# Patient Record
Sex: Female | Born: 1950
Health system: Southern US, Community
[De-identification: ages and names within clinical notes are randomized; demographics above are authoritative.]

## PROBLEM LIST (undated history)

## (undated) DIAGNOSIS — E119 Type 2 diabetes mellitus without complications: Secondary | ICD-10-CM

## (undated) DIAGNOSIS — G4730 Sleep apnea, unspecified: Secondary | ICD-10-CM

## (undated) DIAGNOSIS — E669 Obesity, unspecified: Secondary | ICD-10-CM

## (undated) DIAGNOSIS — I10 Essential (primary) hypertension: Secondary | ICD-10-CM

## (undated) DIAGNOSIS — E782 Mixed hyperlipidemia: Secondary | ICD-10-CM

## (undated) DIAGNOSIS — R000 Tachycardia, unspecified: Secondary | ICD-10-CM

## (undated) DIAGNOSIS — Z86718 Personal history of other venous thrombosis and embolism: Secondary | ICD-10-CM

## (undated) DIAGNOSIS — R7309 Other abnormal glucose: Secondary | ICD-10-CM

## (undated) DIAGNOSIS — I2699 Other pulmonary embolism without acute cor pulmonale: Secondary | ICD-10-CM

## (undated) DIAGNOSIS — M1990 Unspecified osteoarthritis, unspecified site: Secondary | ICD-10-CM

## (undated) DIAGNOSIS — E559 Vitamin D deficiency, unspecified: Secondary | ICD-10-CM

## (undated) DIAGNOSIS — H409 Unspecified glaucoma: Secondary | ICD-10-CM

## (undated) DIAGNOSIS — I509 Heart failure, unspecified: Secondary | ICD-10-CM

## (undated) DIAGNOSIS — J45909 Unspecified asthma, uncomplicated: Secondary | ICD-10-CM

## (undated) DIAGNOSIS — I452 Bifascicular block: Secondary | ICD-10-CM

## (undated) DIAGNOSIS — J302 Other seasonal allergic rhinitis: Secondary | ICD-10-CM

## (undated) DIAGNOSIS — S83209A Unspecified tear of unspecified meniscus, current injury, unspecified knee, initial encounter: Secondary | ICD-10-CM

## (undated) DIAGNOSIS — R0602 Shortness of breath: Secondary | ICD-10-CM

## (undated) DIAGNOSIS — H269 Unspecified cataract: Secondary | ICD-10-CM

## (undated) HISTORY — DX: Unspecified cataract: H26.9

## (undated) HISTORY — PX: OTHER SURGICAL HISTORY: SHX169

## (undated) HISTORY — DX: Obesity, unspecified: E66.9

## (undated) HISTORY — DX: Type 2 diabetes mellitus without complications: E11.9

## (undated) HISTORY — DX: Vitamin D deficiency, unspecified: E55.9

## (undated) HISTORY — PX: TOTAL ABDOMINAL HYSTERECTOMY W/ BILATERAL SALPINGOOPHORECTOMY: SHX83

## (undated) HISTORY — DX: Unspecified glaucoma: H40.9

## (undated) HISTORY — DX: Essential (primary) hypertension: I10

## (undated) HISTORY — DX: Heart failure, unspecified: I50.9

## (undated) HISTORY — DX: Other abnormal glucose: R73.09

## (undated) HISTORY — DX: Unspecified asthma, uncomplicated: J45.909

## (undated) HISTORY — DX: Mixed hyperlipidemia: E78.2

---

## 1999-11-10 ENCOUNTER — Ambulatory Visit (HOSPITAL_COMMUNITY): Admission: RE | Admit: 1999-11-10 | Discharge: 1999-11-10 | Payer: Self-pay | Admitting: *Deleted

## 2002-12-09 ENCOUNTER — Ambulatory Visit (HOSPITAL_BASED_OUTPATIENT_CLINIC_OR_DEPARTMENT_OTHER): Admission: RE | Admit: 2002-12-09 | Discharge: 2002-12-09 | Payer: Self-pay | Admitting: Orthopedic Surgery

## 2002-12-09 HISTORY — PX: KNEE ARTHROSCOPY: SUR90

## 2005-01-04 ENCOUNTER — Ambulatory Visit (HOSPITAL_COMMUNITY): Admission: RE | Admit: 2005-01-04 | Discharge: 2005-01-04 | Payer: Self-pay | Admitting: Family Medicine

## 2008-07-30 ENCOUNTER — Emergency Department (HOSPITAL_COMMUNITY): Admission: EM | Admit: 2008-07-30 | Discharge: 2008-07-30 | Payer: Self-pay | Admitting: Emergency Medicine

## 2008-08-05 ENCOUNTER — Ambulatory Visit: Payer: Self-pay | Admitting: *Deleted

## 2008-09-11 ENCOUNTER — Encounter: Admission: RE | Admit: 2008-09-11 | Discharge: 2008-12-10 | Payer: Self-pay | Admitting: Internal Medicine

## 2008-10-08 ENCOUNTER — Encounter: Payer: Self-pay | Admitting: Cardiology

## 2008-10-20 ENCOUNTER — Ambulatory Visit: Payer: Self-pay | Admitting: *Deleted

## 2009-02-28 ENCOUNTER — Ambulatory Visit (HOSPITAL_COMMUNITY): Admission: RE | Admit: 2009-02-28 | Discharge: 2009-02-28 | Payer: Self-pay | Admitting: Sports Medicine

## 2009-03-19 ENCOUNTER — Encounter: Payer: Self-pay | Admitting: Cardiology

## 2009-03-25 DIAGNOSIS — I10 Essential (primary) hypertension: Secondary | ICD-10-CM | POA: Insufficient documentation

## 2009-03-26 ENCOUNTER — Ambulatory Visit: Payer: Self-pay | Admitting: Cardiology

## 2010-07-18 ENCOUNTER — Encounter: Payer: Self-pay | Admitting: Family Medicine

## 2010-10-26 ENCOUNTER — Encounter: Payer: Self-pay | Admitting: Cardiology

## 2010-10-28 ENCOUNTER — Ambulatory Visit (INDEPENDENT_AMBULATORY_CARE_PROVIDER_SITE_OTHER): Payer: BC Managed Care – PPO | Admitting: Cardiology

## 2010-10-28 ENCOUNTER — Encounter: Payer: Self-pay | Admitting: Cardiology

## 2010-10-28 VITALS — BP 140/87 | HR 103 | Ht 62.0 in | Wt 288.0 lb

## 2010-10-28 DIAGNOSIS — R9431 Abnormal electrocardiogram [ECG] [EKG]: Secondary | ICD-10-CM

## 2010-10-28 DIAGNOSIS — E669 Obesity, unspecified: Secondary | ICD-10-CM

## 2010-10-28 DIAGNOSIS — I10 Essential (primary) hypertension: Secondary | ICD-10-CM

## 2010-10-28 DIAGNOSIS — R0609 Other forms of dyspnea: Secondary | ICD-10-CM

## 2010-10-28 DIAGNOSIS — R0989 Other specified symptoms and signs involving the circulatory and respiratory systems: Secondary | ICD-10-CM

## 2010-10-28 DIAGNOSIS — R0600 Dyspnea, unspecified: Secondary | ICD-10-CM

## 2010-10-28 NOTE — Assessment & Plan Note (Signed)
I would like to assess her with an echocardiogram to evaluate her dyspnea and abnormal EKG. Provided this is normal I would not suggest further cardiovascular testing prior to breast reduction therapy. I have instructed her on how to keep her blood pressure diary and heart rate as I would like to know if she's having continued resting tachycardia and she's not in distress at a doctor's office waiting in hour for her appointment.
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
## 2010-10-28 NOTE — Patient Instructions (Signed)
You will be scheduled for a 2 D Echo. Follow up with Dr Antoine Poche in 4 months.

## 2010-10-28 NOTE — Assessment & Plan Note (Signed)
She has lost some weight and I applauded his. She understands the need for continued weight loss with diet and exercise and I agree with plans for breast reduction.

## 2010-10-28 NOTE — Assessment & Plan Note (Signed)
Her blood pressure with the most part is well controlled and she will continue meds as listed.

## 2010-11-03 ENCOUNTER — Telehealth: Payer: Self-pay | Admitting: Cardiology

## 2010-11-03 NOTE — Telephone Encounter (Signed)
Left message with patient husband for Ms. Nobis to call the office to schedule echo/57month rov with Dr. Antoine Poche. Patient has not returned my call. The appointments with be in recall in-case the patient calls back.
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
## 2010-11-12 NOTE — Op Note (Signed)
NAME:  Kerry Liu, HASSEBROCK                          ACCOUNT NO.:  [id number]   MEDICAL RECORD NO.:  [id number]                   PATIENT TYPE:  AMB   LOCATION:  [facility]SC                                  FACILITY:  [facility]   PHYSICIAN:  Feliberto Gottron. Turner Daniels, M.D.                DATE OF BIRTH:  Jan 27, 1951   DATE OF PROCEDURE:  12/09/2002  DATE OF DISCHARGE:                                 OPERATIVE REPORT   PREOPERATIVE DIAGNOSIS:  Right knee lateral meniscal tear.   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   PROCEDURE:  Right knee arthroscopic debridement of chondromalacia.   SURGEON:  Feliberto Gottron. Turner Daniels, M.D.   ASSISTANT:  None.   ANESTHESIA:  General endotracheal.   ESTIMATED BLOOD LOSS:  Minimal.   FLUIDS REPLACED:  1300 mL of crystalloid.   DRAINS:  None.   TOURNIQUET TIME:  None.   INDICATIONS FOR PROCEDURE:  This 60 year old woman with symptomatic lateral  knee pain on the right side has failed conservative treatment with  observation, anti-inflammatory medicines and exercises.  Because of  persistent pain, she is taken for an arthroscopic evaluation and treatment  of her right knee and has a presumed lateral meniscal tear, versus  chondromalacia of the lateral side of the joint.  The risks and benefits of  surgery are understood by the patient.  She is taken for this procedure, to  decrease pain and increase function.   DESCRIPTION OF PROCEDURE:  The patient was identified by arm band and taken  to the operating room at [facility]. [facility].  Appropriate anesthetic monitors were attached.  A general endotracheal  anesthesia was induced with the patient in the supine position.  The lateral  posts were applied to the table and the right lower extremity was prepped  and draped in the usual sterile fashion from the ankle to the near thigh.  Using 0.5% Marcaine and  epinephrine solution, we then infiltrated the  inferomedial and inferolateral peripatellar portal regions with 5 mL into  each portion, and made standard inferomedial and inferolateral peripatellar  portals with a #11 blade.  The arthroscope was introduced through the  inferolateral portal, the outflow through the inferomedial portal.  Diagnostic arthroscopy revealed focal grade 3 flap tears of the attachment  of the patella which was debrided back to stable margins with the 3.5 gator  sucker shaver.  The medial compartment was in excellent condition.  The ACL  was intact on the lateral side.  Grade 3 chondromalacia of the  posterolateral section of the lateral tibial condyle was identified and  debrided back to stable margin with a 3.5 gator sucker shaver.  The gutters  were cleared.  The scope was taken to the lateral side of the PCL, clearing  the posterior compartment  as well.  The  knee was then washed out with normal saline solution.  The arthroscopic  instruments were removed.  A dressing with Xeroform, 4 x 4 dressing,  sponges, Webril and an Ace wrap applied.  The patient was awakened and taken to the recovery room without difficulty.                                                Feliberto Gottron. Turner Daniels, M.D.    Ovid Curd  D:  12/09/2002  T:  12/09/2002  Job:  [id number]

## 2010-11-17 ENCOUNTER — Encounter: Payer: BC Managed Care – PPO | Admitting: *Deleted

## 2010-11-17 ENCOUNTER — Ambulatory Visit (HOSPITAL_COMMUNITY): Payer: BC Managed Care – PPO | Attending: Cardiology

## 2010-11-17 ENCOUNTER — Other Ambulatory Visit (HOSPITAL_COMMUNITY): Payer: BC Managed Care – PPO

## 2010-11-17 DIAGNOSIS — I10 Essential (primary) hypertension: Secondary | ICD-10-CM | POA: Insufficient documentation

## 2010-11-17 DIAGNOSIS — E669 Obesity, unspecified: Secondary | ICD-10-CM | POA: Insufficient documentation

## 2010-11-17 DIAGNOSIS — E785 Hyperlipidemia, unspecified: Secondary | ICD-10-CM | POA: Insufficient documentation

## 2010-11-17 DIAGNOSIS — R9431 Abnormal electrocardiogram [ECG] [EKG]: Secondary | ICD-10-CM

## 2010-11-17 DIAGNOSIS — R0609 Other forms of dyspnea: Secondary | ICD-10-CM | POA: Insufficient documentation

## 2010-11-17 DIAGNOSIS — R0600 Dyspnea, unspecified: Secondary | ICD-10-CM

## 2010-11-17 DIAGNOSIS — R0989 Other specified symptoms and signs involving the circulatory and respiratory systems: Secondary | ICD-10-CM

## 2010-11-17 HISTORY — PX: TRANSTHORACIC ECHOCARDIOGRAM: SHX275

## 2010-12-24 ENCOUNTER — Encounter: Payer: Self-pay | Admitting: Cardiology

## 2011-01-13 ENCOUNTER — Telehealth: Payer: Self-pay | Admitting: Cardiology

## 2011-01-13 NOTE — Telephone Encounter (Signed)
Patient called to request a copy of her Echo. Be sent to her Primary Care Physician's Office. I faxed a copy of the echo. to Loree Fee, PA at [facility] ([phone]).

## 2012-07-20 ENCOUNTER — Other Ambulatory Visit: Payer: Self-pay | Admitting: Sports Medicine

## 2012-07-20 DIAGNOSIS — M25561 Pain in right knee: Secondary | ICD-10-CM

## 2012-07-24 ENCOUNTER — Ambulatory Visit
Admission: RE | Admit: 2012-07-24 | Discharge: 2012-07-24 | Disposition: A | Discharge status: Home / Self Care | Payer: BC Managed Care – PPO | Source: Ambulatory Visit | Attending: Sports Medicine | Admitting: Sports Medicine

## 2012-07-24 DIAGNOSIS — M25561 Pain in right knee: Secondary | ICD-10-CM

## 2012-08-16 ENCOUNTER — Ambulatory Visit (INDEPENDENT_AMBULATORY_CARE_PROVIDER_SITE_OTHER): Payer: BC Managed Care – PPO | Admitting: Cardiology

## 2012-08-16 ENCOUNTER — Encounter: Payer: Self-pay | Admitting: Cardiology

## 2012-08-16 ENCOUNTER — Encounter: Payer: Self-pay | Admitting: *Deleted

## 2012-08-16 VITALS — BP 150/98 | HR 135 | Ht 62.0 in | Wt 268.4 lb

## 2012-08-16 DIAGNOSIS — I10 Essential (primary) hypertension: Secondary | ICD-10-CM

## 2012-08-16 DIAGNOSIS — E669 Obesity, unspecified: Secondary | ICD-10-CM

## 2012-08-16 DIAGNOSIS — Z01810 Encounter for preprocedural cardiovascular examination: Secondary | ICD-10-CM

## 2012-08-16 DIAGNOSIS — R9431 Abnormal electrocardiogram [ECG] [EKG]: Secondary | ICD-10-CM

## 2012-08-16 MED ORDER — METOPROLOL TARTRATE 50 MG PO TABS: 50.0000 mg | ORAL_TABLET | Freq: Two times a day (BID) | ORAL | Status: DC

## 2012-08-16 NOTE — Patient Instructions (Addendum)
Please start Metoprolol 50 mg one twice a day. Continue all other medications as listed.  Please have blood work at next office visit.  Follow up in 1 week.
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
## 2012-08-21 ENCOUNTER — Other Ambulatory Visit: Payer: Self-pay

## 2012-08-21 ENCOUNTER — Encounter: Payer: Self-pay | Admitting: Physician Assistant

## 2012-08-21 ENCOUNTER — Ambulatory Visit (INDEPENDENT_AMBULATORY_CARE_PROVIDER_SITE_OTHER): Payer: BC Managed Care – PPO | Admitting: Physician Assistant

## 2012-08-21 VITALS — BP 120/90 | HR 70 | Ht 62.0 in | Wt 265.8 lb

## 2012-08-21 DIAGNOSIS — Z01810 Encounter for preprocedural cardiovascular examination: Secondary | ICD-10-CM

## 2012-08-21 DIAGNOSIS — M1990 Unspecified osteoarthritis, unspecified site: Secondary | ICD-10-CM

## 2012-08-21 DIAGNOSIS — I10 Essential (primary) hypertension: Secondary | ICD-10-CM

## 2012-08-21 DIAGNOSIS — M129 Arthropathy, unspecified: Secondary | ICD-10-CM

## 2012-08-21 DIAGNOSIS — R9431 Abnormal electrocardiogram [ECG] [EKG]: Secondary | ICD-10-CM

## 2012-08-21 NOTE — Patient Instructions (Addendum)
**Note De-Identified [name] Obfuscation** Your physician recommends that you return for lab work in: today  Your physician recommends that you continue on your current medications as directed. Please refer to the Current Medication list given to you today.  Your physician recommends that you schedule a follow-up appointment in: as needed  You have been referred to [facility] for a second opinion on knee arthritis
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
## 2012-08-22 LAB — BASIC METABOLIC PANEL
BUN: 26 mg/dL — ABNORMAL HIGH (ref 6–23)
CO2: 29 mEq/L (ref 19–32)
Chloride: 103 mEq/L (ref 96–112)
Creatinine, Ser: 1.2 mg/dL (ref 0.4–1.2)
Glucose, Bld: 143 mg/dL — ABNORMAL HIGH (ref 70–99)

## 2012-08-29 ENCOUNTER — Telehealth: Payer: Self-pay | Admitting: Cardiology

## 2012-09-10 ENCOUNTER — Telehealth: Payer: Self-pay | Admitting: Cardiology

## 2012-09-10 NOTE — Telephone Encounter (Signed)
New Problem:    Patient called in because she is having issues arranging an appointment to see an orthopedic MD per Dr. Jenene Slicker referral.  Patient claims that she dropped of her records to their office and they continue to tell her that their physicians have not evaluated her case yet.  Please call back.

## 2012-09-10 NOTE — Telephone Encounter (Signed)
[facility] was where she was to be seen. Pt very distraught regarding issues with her medical records. Pt was seen at [facility] and now needs app at [facility], pt took records to [facility] and dropped off- they were miss placed for 1 week and pt had to get another set of records in the mean time, she is off of work and needs to be seen for her knee, pt getting run around, they will not set app till pts records are reviewed. I sent staff msg to [facility] pool to set app for her. i will forward to dr/nurse

## 2012-09-11 NOTE — Telephone Encounter (Signed)
Left message for pt to call back to discuss her needs

## 2012-09-11 NOTE — Telephone Encounter (Signed)
Kerry Liu note is a preop clearance note.  I am not sure from this phone contact whether there is something else that this office is supposed to be doing.

## 2012-09-11 NOTE — Telephone Encounter (Signed)
Per Jasmine December - pt scheduled with Dr Darrelyn Hillock March 27 @10 :45 am  Left message for pt at her home number of the appointment date and time and to call back with any questions or concerns.

## 2012-09-25 NOTE — Telephone Encounter (Signed)
New problem    Status of cardiac clearance for [facility].

## 2012-09-25 NOTE — Telephone Encounter (Signed)
Pt states that a cardiac clearance letter from [facility] was faxed to [place] last week and she wants to know if she has been cleared. Pt request a call from Dr. Jenene Slicker nurse, Elita Quick, tomorrow. Note forwarded to [facility].

## 2012-09-26 NOTE — Telephone Encounter (Signed)
Clearance routed to Dr Ranell Patrick that was given by Lilian Coma 08/21/2012  Pt is aware

## 2012-09-28 ENCOUNTER — Encounter (HOSPITAL_BASED_OUTPATIENT_CLINIC_OR_DEPARTMENT_OTHER): Payer: Self-pay | Admitting: *Deleted

## 2012-10-01 ENCOUNTER — Encounter (HOSPITAL_BASED_OUTPATIENT_CLINIC_OR_DEPARTMENT_OTHER): Payer: Self-pay | Admitting: *Deleted

## 2012-10-01 NOTE — Progress Notes (Signed)
NPO AFTER MN. ARRIVES AT 1030. NEEDS ISTAT . CURRENT EKG IN EPIC AND CHART. WILL TAKE LOPRESSOR AND CRESTOR AM OF SURG W/ SIP OF WATER.

## 2012-10-03 NOTE — H&P (Signed)
Kerry Liu is an 62 y.o. female.   Chief Complaint: right knee pain HPI: The patient is a 62 year old female who presented with knee complaints. The patient reports right knee symptoms including: pain which began 4 months ago following a specific injury. The injury occurred due to a fall. Symptoms are reported to be located in the right knee and include knee pain, instability, difficulty bearing weight and difficulty ambulating. The patient describes their pain as aching. Symptoms are exacerbated by motion at the knee, weight bearing and walking. She had cortisone injections, she had only one injection with no relief. She has not had any surgery or viscosupplementation injections. MRI revealed torn medial meniscus.   Past Medical History  Diagnosis Date  . HLD (hyperlipidemia)   . Acute meniscal tear of knee     RIGHT  . History of DVT (deep vein thrombosis)     IN PREGNANCY  . RBBB (right bundle branch block with left anterior fascicular block)   . Sinus tachycardia   . Seasonal allergies   . HTN (hypertension)     CARDIOLOGIST--  DR HOCHREIN-- CLEARANCE NOTE IN EPIC AND CHART FROM 08-21-2012  . Arthritis     Past Surgical History  Procedure Laterality Date  . Knee arthroscopy Right 12-09-2002  . Transthoracic echocardiogram  11-17-2010    MILD LVH/ EF 60-65%/ GRADE I DIASTOLIC DYSFUNCTION/ MILD LAE / MILD RAE  . Abdominal hysterectomy  AGE 23    W/ BILATERAL SALPINGOOPHORECTOMY    Family History  Problem Relation Age of Onset  . Heart attack     Social History:  reports that she has never smoked. She has never used smokeless tobacco. She reports that she does not drink alcohol or use illicit drugs.  Allergies:  Allergies  Allergen Reactions  . Oxycodone Rash  . Tramadol Rash    Current outpatient prescriptions: acetaminophen (TYLENOL) 500 MG tablet, Take 500 mg by mouth every 6 (six) hours as needed for pain., Disp: , Rfl: ;   Cholecalciferol (VITAMIN D3) 5000 UNITS  CAPS, Take 1 capsule by mouth daily., Disp: , Rfl: ;  losartan-hydrochlorothiazide (HYZAAR) 100-25 MG per tablet, Take 1 tablet by mouth every morning. , Disp: , Rfl: ;   Magnesium Oxide 250 MG TABS, Take 1 tablet by mouth daily. , Disp: , Rfl:  meloxicam (MOBIC) 7.5 MG tablet, Take 7.5 mg by mouth 2 (two) times daily. , Disp: , Rfl: ;  metoprolol (LOPRESSOR) 50 MG tablet, Take 1 tablet (50 mg total) by mouth 2 (two) times daily., Disp: 60 tablet, Rfl: 3;   pravastatin (PRAVACHOL) 40 MG tablet, Take 40 mg by mouth every morning. , Disp: , Rfl: ;  rosuvastatin (CRESTOR) 20 MG tablet, Take 20 mg by mouth every morning. , Disp: , Rfl:    Review of Systems  Constitutional: Negative.   HENT: Negative.  Negative for neck pain.   Eyes: Negative.   Respiratory: Negative.   Cardiovascular: Negative.   Gastrointestinal: Negative.   Genitourinary: Negative.   Musculoskeletal: Positive for joint pain and falls. Negative for myalgias and back pain.       Right knee pain  Skin: Negative.   Endo/Heme/Allergies: Negative.   Psychiatric/Behavioral: Negative.     Height 5\' 2"  (1.575 m), weight 120.203 kg (265 lb). Physical Exam  Constitutional: She is oriented to person, place, and time. She appears well-developed and well-nourished. No distress.  HENT:  Head: Normocephalic and atraumatic.  Right Ear: External ear normal.  Left  Ear: External ear normal.  Nose: Nose normal.  Mouth/Throat: Oropharynx is clear and moist.  Eyes: Conjunctivae and EOM are normal.  Neck: Normal range of motion. Neck supple. No tracheal deviation present. No thyromegaly present.  Cardiovascular: Normal rate, regular rhythm, normal heart sounds and intact distal pulses.   No murmur heard. Respiratory: Effort normal and breath sounds normal. No respiratory distress. She has no wheezes. She exhibits no tenderness.  GI: Soft. Bowel sounds are normal. She exhibits no distension and no mass. There is no tenderness.   Musculoskeletal:       Right hip: Normal.       Left hip: Normal.       Right knee: She exhibits decreased range of motion, swelling, effusion and abnormal meniscus. She exhibits no erythema. Tenderness found. Medial joint line tenderness noted.       Left knee: Normal.       Right lower leg: She exhibits no tenderness and no swelling.       Left lower leg: She exhibits no tenderness and no swelling.  Lymphadenopathy:    She has no cervical adenopathy.  Neurological: She is alert and oriented to person, place, and time. She has normal strength and normal reflexes. No sensory deficit.  Skin: No rash noted. She is not diaphoretic. No erythema.  Psychiatric: She has a normal mood and affect. Her behavior is normal.     Assessment/Plan Right knee, medial meniscus tear She needs a right knee arthroscopy with medial menisectomy. Dr. Darrelyn Hillock discussed with her the risks and benefits of the surgery. She does have an underlying arthritic knee, but this surgery will hopefully delay the need for a total knee arthroplasty.      Callee Rohrig LAUREN 10/03/2012, 9:35 AM

## 2012-10-05 ENCOUNTER — Ambulatory Visit (HOSPITAL_BASED_OUTPATIENT_CLINIC_OR_DEPARTMENT_OTHER): Payer: BC Managed Care – PPO | Admitting: Anesthesiology

## 2012-10-05 ENCOUNTER — Encounter (HOSPITAL_BASED_OUTPATIENT_CLINIC_OR_DEPARTMENT_OTHER): Payer: Self-pay | Admitting: Anesthesiology

## 2012-10-05 ENCOUNTER — Encounter (HOSPITAL_BASED_OUTPATIENT_CLINIC_OR_DEPARTMENT_OTHER)
Admission: RE | Disposition: A | Discharge status: Home / Self Care | Payer: Self-pay | Source: Ambulatory Visit | Attending: Orthopedic Surgery

## 2012-10-05 ENCOUNTER — Ambulatory Visit (HOSPITAL_BASED_OUTPATIENT_CLINIC_OR_DEPARTMENT_OTHER)
Admission: RE | Admit: 2012-10-05 | Discharge: 2012-10-05 | Disposition: A | Discharge status: Home / Self Care | Payer: BC Managed Care – PPO | Source: Ambulatory Visit | Attending: Orthopedic Surgery | Admitting: Orthopedic Surgery

## 2012-10-05 DIAGNOSIS — W19XXXA Unspecified fall, initial encounter: Secondary | ICD-10-CM | POA: Insufficient documentation

## 2012-10-05 DIAGNOSIS — Z86718 Personal history of other venous thrombosis and embolism: Secondary | ICD-10-CM | POA: Insufficient documentation

## 2012-10-05 DIAGNOSIS — M659 Unspecified synovitis and tenosynovitis, unspecified site: Secondary | ICD-10-CM | POA: Insufficient documentation

## 2012-10-05 DIAGNOSIS — Z79899 Other long term (current) drug therapy: Secondary | ICD-10-CM | POA: Insufficient documentation

## 2012-10-05 DIAGNOSIS — M9420 Chondromalacia, unspecified site: Secondary | ICD-10-CM | POA: Insufficient documentation

## 2012-10-05 DIAGNOSIS — I10 Essential (primary) hypertension: Secondary | ICD-10-CM | POA: Insufficient documentation

## 2012-10-05 DIAGNOSIS — M2240 Chondromalacia patellae, unspecified knee: Secondary | ICD-10-CM | POA: Insufficient documentation

## 2012-10-05 DIAGNOSIS — E785 Hyperlipidemia, unspecified: Secondary | ICD-10-CM | POA: Insufficient documentation

## 2012-10-05 DIAGNOSIS — IMO0002 Reserved for concepts with insufficient information to code with codable children: Secondary | ICD-10-CM | POA: Insufficient documentation

## 2012-10-05 DIAGNOSIS — Z6841 Body Mass Index (BMI) 40.0 and over, adult: Secondary | ICD-10-CM | POA: Insufficient documentation

## 2012-10-05 DIAGNOSIS — M1710 Unilateral primary osteoarthritis, unspecified knee: Secondary | ICD-10-CM | POA: Insufficient documentation

## 2012-10-05 DIAGNOSIS — M1711 Unilateral primary osteoarthritis, right knee: Secondary | ICD-10-CM | POA: Diagnosis present

## 2012-10-05 HISTORY — DX: Tachycardia, unspecified: R00.0

## 2012-10-05 HISTORY — DX: Unspecified tear of unspecified meniscus, current injury, unspecified knee, initial encounter: S83.209A

## 2012-10-05 HISTORY — PX: KNEE ARTHROSCOPY WITH MEDIAL MENISECTOMY: SHX5651

## 2012-10-05 HISTORY — DX: Bifascicular block: I45.2

## 2012-10-05 HISTORY — DX: Unspecified osteoarthritis, unspecified site: M19.90

## 2012-10-05 HISTORY — DX: Personal history of other venous thrombosis and embolism: Z86.718

## 2012-10-05 HISTORY — DX: Other seasonal allergic rhinitis: J30.2

## 2012-10-05 SURGERY — ARTHROSCOPY, KNEE, WITH MEDIAL MENISCECTOMY
Anesthesia: General | Site: Knee | Laterality: Right | Wound class: Clean

## 2012-10-05 MED ORDER — LACTATED RINGERS IV SOLN
INTRAVENOUS | Status: DC
  Filled 2012-10-05: qty 1000

## 2012-10-05 MED ORDER — PROMETHAZINE HCL 25 MG/ML IJ SOLN
6.2500 mg | INTRAMUSCULAR | Status: DC | PRN
  Filled 2012-10-05: qty 1

## 2012-10-05 MED ORDER — LIDOCAINE HCL (CARDIAC) 20 MG/ML IV SOLN
INTRAVENOUS | Status: DC | PRN
  Administered 2012-10-05: 80 mg via INTRAVENOUS

## 2012-10-05 MED ORDER — HYDROMORPHONE HCL 2 MG PO TABS: 2.0000 mg | ORAL_TABLET | ORAL | Status: DC | PRN

## 2012-10-05 MED ORDER — HYDROMORPHONE HCL 2 MG PO TABS
2.0000 mg | ORAL_TABLET | Freq: Once | ORAL | Status: AC
  Administered 2012-10-05: 2 mg via ORAL
  Filled 2012-10-05: qty 1

## 2012-10-05 MED ORDER — BACITRACIN-NEOMYCIN-POLYMYXIN 400-5-5000 EX OINT
TOPICAL_OINTMENT | CUTANEOUS | Status: DC | PRN
  Administered 2012-10-05: 1 via TOPICAL

## 2012-10-05 MED ORDER — METOCLOPRAMIDE HCL 5 MG/ML IJ SOLN
INTRAMUSCULAR | Status: DC | PRN
  Administered 2012-10-05: 10 mg via INTRAVENOUS

## 2012-10-05 MED ORDER — ACETAMINOPHEN 10 MG/ML IV SOLN
INTRAVENOUS | Status: DC | PRN
  Administered 2012-10-05: 1000 mg via INTRAVENOUS

## 2012-10-05 MED ORDER — PROPOFOL 10 MG/ML IV BOLUS
INTRAVENOUS | Status: DC | PRN
  Administered 2012-10-05: 20 mg via INTRAVENOUS
  Administered 2012-10-05: 200 mg via INTRAVENOUS

## 2012-10-05 MED ORDER — EPHEDRINE SULFATE 50 MG/ML IJ SOLN
INTRAMUSCULAR | Status: DC | PRN
  Administered 2012-10-05 (×2): 10 mg via INTRAVENOUS

## 2012-10-05 MED ORDER — FENTANYL CITRATE 0.05 MG/ML IJ SOLN
25.0000 ug | INTRAMUSCULAR | Status: DC | PRN
  Administered 2012-10-05: 25 ug via INTRAVENOUS
  Filled 2012-10-05: qty 1

## 2012-10-05 MED ORDER — MEPERIDINE HCL 25 MG/ML IJ SOLN
6.2500 mg | INTRAMUSCULAR | Status: DC | PRN
  Filled 2012-10-05: qty 1

## 2012-10-05 MED ORDER — SODIUM CHLORIDE 0.9 % IR SOLN
Status: DC | PRN
  Administered 2012-10-05: 6000 mL

## 2012-10-05 MED ORDER — CEFAZOLIN SODIUM-DEXTROSE 2-3 GM-% IV SOLR
2.0000 g | INTRAVENOUS | Status: AC
  Administered 2012-10-05: 2 g via INTRAVENOUS
  Filled 2012-10-05: qty 50

## 2012-10-05 MED ORDER — CHLORHEXIDINE GLUCONATE 4 % EX LIQD
60.0000 mL | Freq: Once | CUTANEOUS | Status: DC
  Filled 2012-10-05: qty 60

## 2012-10-05 MED ORDER — LACTATED RINGERS IV SOLN
INTRAVENOUS | Status: DC
  Administered 2012-10-05 (×2): via INTRAVENOUS
  Filled 2012-10-05: qty 1000

## 2012-10-05 MED ORDER — MIDAZOLAM HCL 5 MG/5ML IJ SOLN
INTRAMUSCULAR | Status: DC | PRN
  Administered 2012-10-05 (×2): 0.5 mg via INTRAVENOUS

## 2012-10-05 MED ORDER — BUPIVACAINE-EPINEPHRINE 0.25% -1:200000 IJ SOLN
INTRAMUSCULAR | Status: DC | PRN
  Administered 2012-10-05: 30 mL

## 2012-10-05 MED ORDER — FENTANYL CITRATE 0.05 MG/ML IJ SOLN
INTRAMUSCULAR | Status: DC | PRN
  Administered 2012-10-05: 25 ug via INTRAVENOUS
  Administered 2012-10-05: 50 ug via INTRAVENOUS
  Administered 2012-10-05: 25 ug via INTRAVENOUS

## 2012-10-05 MED ORDER — DEXAMETHASONE SODIUM PHOSPHATE 4 MG/ML IJ SOLN
INTRAMUSCULAR | Status: DC | PRN
  Administered 2012-10-05: 10 mg via INTRAVENOUS

## 2012-10-05 MED ORDER — ONDANSETRON HCL 4 MG/2ML IJ SOLN
INTRAMUSCULAR | Status: DC | PRN
  Administered 2012-10-05: 4 mg via INTRAVENOUS

## 2012-10-05 SURGICAL SUPPLY — 30 items
BANDAGE ELASTIC 4 VELCRO ST LF (GAUZE/BANDAGES/DRESSINGS) ×2 IMPLANT
BANDAGE ELASTIC 6 VELCRO ST LF (GAUZE/BANDAGES/DRESSINGS) ×2 IMPLANT
BLADE GREAT WHITE 4.2 (BLADE) ×2 IMPLANT
BNDG COHESIVE 6X5 TAN NS LF (GAUZE/BANDAGES/DRESSINGS) ×1 IMPLANT
CANISTER SUCT LVC 12 LTR MEDI- (MISCELLANEOUS) ×2 IMPLANT
CANISTER SUCTION 2500CC (MISCELLANEOUS) ×2 IMPLANT
CLOTH BEACON ORANGE TIMEOUT ST (SAFETY) ×2 IMPLANT
DRAPE ARTHROSCOPY W/POUCH 114 (DRAPES) ×2 IMPLANT
DRAPE LG THREE QUARTER DISP (DRAPES) ×2 IMPLANT
DRSG EMULSION OIL 3X3 NADH (GAUZE/BANDAGES/DRESSINGS) ×2 IMPLANT
DRSG PAD ABDOMINAL 8X10 ST (GAUZE/BANDAGES/DRESSINGS) ×4 IMPLANT
DURAPREP 26ML APPLICATOR (WOUND CARE) ×2 IMPLANT
GLOVE ECLIPSE 8.0 STRL XLNG CF (GLOVE) ×3 IMPLANT
GLOVE INDICATOR 8.5 STRL (GLOVE) ×3 IMPLANT
GLOVE SURG ORTHO 6.0 STRL STRW (GLOVE) IMPLANT
GOWN PREVENTION PLUS LG XLONG (DISPOSABLE) ×2 IMPLANT
GOWN STRL REIN XL XLG (GOWN DISPOSABLE) ×2 IMPLANT
KNEE WRAP E Z 3 GEL PACK (MISCELLANEOUS) ×2 IMPLANT
MINI VAC (SURGICAL WAND) ×2 IMPLANT
PACK ARTHROSCOPY DSU (CUSTOM PROCEDURE TRAY) ×2 IMPLANT
PACK BASIN DAY SURGERY FS (CUSTOM PROCEDURE TRAY) ×2 IMPLANT
PADDING CAST COTTON 6X4 STRL (CAST SUPPLIES) ×2 IMPLANT
SET ARTHROSCOPY TUBING (MISCELLANEOUS) ×2
SET ARTHROSCOPY TUBING LN (MISCELLANEOUS) ×1 IMPLANT
SPONGE GAUZE 4X4 12PLY (GAUZE/BANDAGES/DRESSINGS) ×2 IMPLANT
SPONGE SURGIFOAM ABS GEL 12-7 (HEMOSTASIS) IMPLANT
SUT ETHILON 3 0 PS 1 (SUTURE) ×2 IMPLANT
SYR CONTROL 10ML LL (SYRINGE) ×1 IMPLANT
TOWEL OR 17X24 6PK STRL BLUE (TOWEL DISPOSABLE) ×2 IMPLANT
WATER STERILE IRR 500ML POUR (IV SOLUTION) ×2 IMPLANT

## 2012-10-05 NOTE — Anesthesia Procedure Notes (Signed)
Procedure Name: LMA Insertion Date/Time: 10/05/2012 1:04 PM Performed by: Norva Pavlov Pre-anesthesia Checklist: Patient identified, Emergency Drugs available, Suction available and Patient being monitored Patient Re-evaluated:Patient Re-evaluated prior to inductionOxygen Delivery Method: Circle System Utilized Preoxygenation: Pre-oxygenation with 100% oxygen Intubation Type: IV induction Ventilation: Mask ventilation without difficulty LMA: LMA with gastric port inserted LMA Size: 4.0 Number of attempts: 1 Placement Confirmation: positive ETCO2 Tube secured with: Tape Dental Injury: Teeth and Oropharynx as per pre-operative assessment

## 2012-10-05 NOTE — Anesthesia Postprocedure Evaluation (Signed)
  Anesthesia Post-op Note  Patient: Kerry Liu  Procedure(s) Performed: Procedure(s) (LRB): KNEE ARTHROSCOPY WITH MEDIAL MENISECTOMY (Right)  Patient Location: PACU  Anesthesia Type: General  Level of Consciousness: awake and alert   Airway and Oxygen Therapy: Patient Spontanous Breathing  Post-op Pain: mild  Post-op Assessment: Post-op Vital signs reviewed, Patient's Cardiovascular Status Stable, Respiratory Function Stable, Patent Airway and No signs of Nausea or vomiting  Last Vitals:  Filed Vitals:   10/05/12 1430  BP: 141/73  Pulse: 64  Temp:   Resp: 12    Post-op Vital Signs: stable   Complications: No apparent anesthesia complications

## 2012-10-05 NOTE — Anesthesia Preprocedure Evaluation (Addendum)
Anesthesia Evaluation  Patient identified by MRN, date of birth, ID band Patient awake    Reviewed: Allergy & Precautions, H&P , NPO status , Patient's Chart, lab work & pertinent test results  Airway Mallampati: II TM Distance: >3 FB Neck ROM: Full    Dental no notable dental hx. (+) Partial Upper   Pulmonary neg pulmonary ROS,  breath sounds clear to auscultation  Pulmonary exam normal       Cardiovascular hypertension, Pt. on medications and Pt. on home beta blockers DVT negative cardio ROS  - dysrhythmias Rhythm:Regular Rate:Normal     Neuro/Psych negative neurological ROS  negative psych ROS   GI/Hepatic negative GI ROS, Neg liver ROS,   Endo/Other  negative endocrine ROSMorbid obesity  Renal/GU negative Renal ROS  negative genitourinary   Musculoskeletal negative musculoskeletal ROS (+)   Abdominal   Peds negative pediatric ROS (+)  Hematology negative hematology ROS (+)   Anesthesia Other Findings   Reproductive/Obstetrics negative OB ROS                          Anesthesia Physical Anesthesia Plan  ASA: II  Anesthesia Plan: General   Post-op Pain Management:    Induction: Intravenous  Airway Management Planned: LMA  Additional Equipment:   Intra-op Plan:   Post-operative Plan:   Informed Consent: I have reviewed the patients History and Physical, chart, labs and discussed the procedure including the risks, benefits and alternatives for the proposed anesthesia with the patient or authorized representative who has indicated his/her understanding and acceptance.   Dental advisory given  Plan Discussed with: CRNA  Anesthesia Plan Comments:         Anesthesia Quick Evaluation

## 2012-10-05 NOTE — Brief Op Note (Signed)
10/05/2012  1:50 PM  PATIENT:  Kerry Liu  62 y.o. 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  POST-OPERATIVE DIAGNOSIS:  Same as Pre-op  PROCEDURE:  Procedure(s): KNEE ARTHROSCOPY WITH MEDIAL MENISECTOMY (Right),Abraision Chondroplasty of Medial Femoral Condyle and Synovectomy of Suprapatellar Pouch  SURGEON:  Surgeon(s) and Role:    * Jacki Cones, MD - Primary  :   ASSISTANTS: OR Tech   ANESTHESIA:   general  EBL:  Total I/O In: 1000 [I.V.:1000] Out: -   BLOOD ADMINISTERED:none  DRAINS: none   LOCAL MEDICATIONS USED:  MARCAINE 30cc of 0.25% with Epinephrine.    SPECIMEN:  No Specimen  DISPOSITION OF SPECIMEN:  N/A  COUNTS:  YES  TOURNIQUET:  * No tourniquets in log *  DICTATION: .Other Dictation: Dictation Number [id number]  PLAN OF CARE: Discharge to home after PACU  PATIENT DISPOSITION:  PACU - hemodynamically stable.   Delay start of Pharmacological VTE agent (>24hrs) due to surgical blood loss or risk of bleeding: yes

## 2012-10-05 NOTE — Interval H&P Note (Signed)
History and Physical Interval Note:  10/05/2012 12:54 PM  Kerry Liu  has presented today for surgery, with the diagnosis of RIGHT KNEE MEDIAL MENISCAL TEAR  The various methods of treatment have been discussed with the patient and family. After consideration of risks, benefits and other options for treatment, the patient has consented to  Procedure(s): KNEE ARTHROSCOPY WITH MEDIAL MENISECTOMY (Right) as a surgical intervention .  The patient's history has been reviewed, patient examined, no change in status, stable for surgery.  I have reviewed the patient's chart and labs.  Questions were answered to the patient's satisfaction.     Humbert Morozov A

## 2012-10-05 NOTE — Transfer of Care (Signed)
Immediate Anesthesia Transfer of Care NoteImmediate Anesthesia Transfer of Care Note  Patient: Kerry Liu  Procedure(s) Performed: Procedure(s) (LRB): KNEE ARTHROSCOPY WITH MEDIAL MENISECTOMY (Right)  Patient Location: PACU  Anesthesia Type: General  Level of Consciousness: awake, alert  and oriented  Airway & Oxygen Therapy: Patient Spontanous Breathing and Patient connected to face mask oxygen  Post-op Assessment: Report given to PACU RN and Post -op Vital signs reviewed and stable  Post vital signs: Reviewed and stable  Complications: No apparent anesthesia complications

## 2012-10-06 NOTE — Op Note (Signed)
NAMESHELA, ESSES                 ACCOUNT NO.:  [id number]  MEDICAL RECORD NO.:  [id number]  LOCATION:                                 FACILITY:  PHYSICIAN:  Georges Lynch. Heiley Shaikh, M.D.DATE OF BIRTH:  [birth year]  DATE OF PROCEDURE:  10/05/2012 DATE OF DISCHARGE:                              OPERATIVE REPORT   SURGEON:  Georges Lynch. Teresa Lemmerman, M.D.  ASSISTANT:  Surgical tech.  PREOPERATIVE DIAGNOSES: 1. Osteoarthritis of the right knee. 2. Torn medial meniscus of the posterior horn, right knee.  POSTOPERATIVE DIAGNOSES: 1. Osteoarthritis of the right knee. 2. Torn medial meniscus of the posterior horn, right knee.  OPERATION: 1. Diagnostic arthroscopy, right knee. 2. Synovectomy suprapatellar pouch, right knee. 3. Abrasion chondroplasty, medial femoral condyle, right knee. 4. Partial medial meniscectomy, right knee.  PROCEDURE:  Under general anesthesia, routine orthopedic prep and draping in the right lower extremity was carried out.  The right leg was placed in a knee holder.  At this time, the appropriate time-out was carried out, and I also marked the appropriate right leg in the holding area.  A small punctate incision was made in the suprapatellar pouch. The inflow cannula was inserted.  The knee was distended with saline. Another small punctate incision was made in the anterolateral joint. The arthroscope was entered from the lateral approach and a complete diagnostic arthroscopy was carried out.  I went up into the suprapatellar pouch.  She had rather significant synovitis with some mild chondromalacia of the patella.  I introduced the ArthroCare and did a synovectomy.  I then went down and examined the lateral joint.  The lateral meniscus was intact.  She has a very mild early arthritic changes in the lateral joint.  Cruciates were intact.  I went over the medial joint.  She had significant chondromalacia, I liked that of the medial femoral condyle with a portion of the  cartilage literally hanging off like an orange peel off the condyle.  I simply did an abrasion chondroplasty.  Note, this did not require a microfracture technique. Following that, I then went down, examined the medial meniscus.  She had a small tear of the posterior horn medial meniscus.  I utilized a basket, did a partial medial meniscectomy.  She also had some arthritic changes in the medial tibial plateau.  Photographs were taken.  I thoroughly irrigated out the knee, closed all 3 punctate incisions with 3-0 nylon suture, and I injected 30 mL of 0.25% Marcaine with epinephrine into the knee joint.  Sterile Neosporin dressing was applied after we closed the wound. 1. Postop, she will be on aspirin 325 mg b.i.d. starting today and for     2 weeks as an anticoagulant. 2. She will be on Dilaudid 2 mg every 4 hours p.r.n. for pain. 3. She will be on a walker partial full weightbearing as tolerated. 4. I will see her in the office in 10-12 days or prior to if there is     any problem whatsoever since the calf pain, calf swelling, or high     fever.          ______________________________ Georges Lynch Darrelyn Hillock, M.D.  RAG/MEDQ  D:  10/05/2012  T:  10/06/2012  Job:  [id number]

## 2012-10-08 ENCOUNTER — Encounter (HOSPITAL_BASED_OUTPATIENT_CLINIC_OR_DEPARTMENT_OTHER): Payer: Self-pay | Admitting: Orthopedic Surgery

## 2012-11-21 ENCOUNTER — Emergency Department (HOSPITAL_COMMUNITY): Payer: BC Managed Care – PPO

## 2012-11-21 ENCOUNTER — Encounter (HOSPITAL_COMMUNITY): Payer: Self-pay | Admitting: Emergency Medicine

## 2012-11-21 ENCOUNTER — Inpatient Hospital Stay (HOSPITAL_COMMUNITY)
Admission: EM | Admit: 2012-11-21 | Discharge: 2012-11-25 | DRG: 539 | Disposition: A | Discharge status: Home / Self Care | Payer: BC Managed Care – PPO | Attending: Internal Medicine | Admitting: Internal Medicine

## 2012-11-21 DIAGNOSIS — M129 Arthropathy, unspecified: Secondary | ICD-10-CM | POA: Diagnosis present

## 2012-11-21 DIAGNOSIS — Z79899 Other long term (current) drug therapy: Secondary | ICD-10-CM

## 2012-11-21 DIAGNOSIS — E669 Obesity, unspecified: Secondary | ICD-10-CM

## 2012-11-21 DIAGNOSIS — I2699 Other pulmonary embolism without acute cor pulmonale: Principal | ICD-10-CM | POA: Diagnosis present

## 2012-11-21 DIAGNOSIS — E785 Hyperlipidemia, unspecified: Secondary | ICD-10-CM | POA: Diagnosis present

## 2012-11-21 DIAGNOSIS — I10 Essential (primary) hypertension: Secondary | ICD-10-CM | POA: Diagnosis present

## 2012-11-21 DIAGNOSIS — I452 Bifascicular block: Secondary | ICD-10-CM | POA: Diagnosis present

## 2012-11-21 DIAGNOSIS — M1711 Unilateral primary osteoarthritis, right knee: Secondary | ICD-10-CM

## 2012-11-21 DIAGNOSIS — I82409 Acute embolism and thrombosis of unspecified deep veins of unspecified lower extremity: Secondary | ICD-10-CM | POA: Diagnosis present

## 2012-11-21 HISTORY — DX: Shortness of breath: R06.02

## 2012-11-21 HISTORY — DX: Other pulmonary embolism without acute cor pulmonale: I26.99

## 2012-11-21 LAB — CBC
MCH: 29.7 pg (ref 26.0–34.0)
MCV: 87 fL (ref 78.0–100.0)
Platelets: 196 10*3/uL (ref 150–400)
RBC: 4.71 MIL/uL (ref 3.87–5.11)
RDW: 12.5 % (ref 11.5–15.5)

## 2012-11-21 LAB — BASIC METABOLIC PANEL
BUN: 19 mg/dL (ref 6–23)
CO2: 25 mEq/L (ref 19–32)
Calcium: 9.7 mg/dL (ref 8.4–10.5)
Creatinine, Ser: 0.86 mg/dL (ref 0.50–1.10)
GFR calc Af Amer: 84 mL/min — ABNORMAL LOW (ref >90)

## 2012-11-21 LAB — POCT I-STAT TROPONIN I: Troponin i, poc: 0.03 ng/mL (ref 0.00–0.08)

## 2012-11-21 LAB — PROTIME-INR: Prothrombin Time: 14.1 seconds (ref 11.6–15.2)

## 2012-11-21 MED ORDER — MELOXICAM 7.5 MG PO TABS
7.5000 mg | ORAL_TABLET | Freq: Every day | ORAL | Status: DC
  Administered 2012-11-22 – 2012-11-25 (×4): 7.5 mg via ORAL
  Filled 2012-11-21 (×4): qty 1

## 2012-11-21 MED ORDER — LOSARTAN POTASSIUM 50 MG PO TABS
100.0000 mg | ORAL_TABLET | Freq: Every day | ORAL | Status: DC
  Administered 2012-11-22 – 2012-11-24 (×3): 100 mg via ORAL
  Filled 2012-11-21 (×3): qty 2

## 2012-11-21 MED ORDER — MAGNESIUM OXIDE 400 (241.3 MG) MG PO TABS
200.0000 mg | ORAL_TABLET | Freq: Every day | ORAL | Status: DC
  Administered 2012-11-22 – 2012-11-25 (×4): 200 mg via ORAL
  Filled 2012-11-21 (×4): qty 0.5

## 2012-11-21 MED ORDER — SODIUM CHLORIDE 0.9 % IJ SOLN
3.0000 mL | Freq: Two times a day (BID) | INTRAMUSCULAR | Status: DC
  Administered 2012-11-21 – 2012-11-24 (×5): 3 mL via INTRAVENOUS
  Filled 2012-11-21: qty 3

## 2012-11-21 MED ORDER — HEPARIN BOLUS VIA INFUSION
4800.0000 [IU] | Freq: Once | INTRAVENOUS | Status: AC
  Administered 2012-11-21: 4800 [IU] via INTRAVENOUS

## 2012-11-21 MED ORDER — METOPROLOL TARTRATE 50 MG PO TABS
50.0000 mg | ORAL_TABLET | Freq: Two times a day (BID) | ORAL | Status: DC
  Administered 2012-11-21 – 2012-11-24 (×6): 50 mg via ORAL
  Filled 2012-11-21 (×4): qty 1
  Filled 2012-11-21: qty 2
  Filled 2012-11-21 (×4): qty 1

## 2012-11-21 MED ORDER — SIMVASTATIN 20 MG PO TABS
20.0000 mg | ORAL_TABLET | Freq: Every day | ORAL | Status: DC
  Administered 2012-11-22 – 2012-11-25 (×4): 20 mg via ORAL
  Filled 2012-11-21 (×4): qty 1

## 2012-11-21 MED ORDER — HEPARIN (PORCINE) IN NACL 100-0.45 UNIT/ML-% IJ SOLN
1500.0000 [IU]/h | INTRAMUSCULAR | Status: AC
  Administered 2012-11-21: 1250 [IU]/h via INTRAVENOUS
  Administered 2012-11-22: 1500 [IU]/h via INTRAVENOUS
  Filled 2012-11-21 (×3): qty 250

## 2012-11-21 MED ORDER — MAGNESIUM OXIDE 250 MG PO TABS: 1.0000 | ORAL_TABLET | Freq: Every day | ORAL | Status: DC

## 2012-11-21 MED ORDER — IOHEXOL 350 MG/ML SOLN
100.0000 mL | Freq: Once | INTRAVENOUS | Status: AC | PRN
  Administered 2012-11-21: 100 mL via INTRAVENOUS

## 2012-11-21 MED ORDER — HYDROCHLOROTHIAZIDE 25 MG PO TABS
25.0000 mg | ORAL_TABLET | Freq: Every day | ORAL | Status: DC
  Administered 2012-11-22 – 2012-11-25 (×4): 25 mg via ORAL
  Filled 2012-11-21 (×5): qty 1

## 2012-11-21 MED ORDER — ACETAMINOPHEN 500 MG PO TABS
500.0000 mg | ORAL_TABLET | Freq: Four times a day (QID) | ORAL | Status: DC | PRN
  Administered 2012-11-22 – 2012-11-23 (×3): 500 mg via ORAL
  Filled 2012-11-21 (×3): qty 1

## 2012-11-21 MED ORDER — LOSARTAN POTASSIUM-HCTZ 100-25 MG PO TABS: 1.0000 | ORAL_TABLET | Freq: Every morning | ORAL | Status: DC

## 2012-11-21 NOTE — ED Notes (Signed)
C/o sob, feeling clammy, and decreased energy since Monday.  Pt had R knee surgery approx 5 weeks ago.  Denies chest pain.  Pt sent from Dr. Kathryne Sharper office with reported O2 sats 93% that decreased to 86% in their office.

## 2012-11-21 NOTE — ED Provider Notes (Signed)
History     CSN: [id number]  Arrival date & time 11/21/12  1643   First MD Initiated Contact with Patient 11/21/12 1657      Chief Complaint  Patient presents with  . Shortness of Breath    (Consider location/radiation/quality/duration/timing/severity/associated sxs/prior treatment) Patient is a 62 y.o. female presenting with shortness of breath. The history is provided by the patient.  Shortness of Breath  patient here complaining of shortness of breath x3 days that is exertional and better with rest. Denies any fever or cough. What to see her Dr. today and was noted to be hypoxemic and transported here. She did have knee surgery a month ago. Denies any anginal type chest pain. Has been less active than normal. Symptoms have been persistent and no treatment used prior to arrival  Past Medical History  Diagnosis Date  . HLD (hyperlipidemia)   . Acute meniscal tear of knee     RIGHT  . History of DVT (deep vein thrombosis)     IN PREGNANCY  . RBBB (right bundle branch block with left anterior fascicular block)   . Sinus tachycardia   . Seasonal allergies   . HTN (hypertension)     CARDIOLOGIST--  DR HOCHREIN-- CLEARANCE NOTE IN EPIC AND CHART FROM 08-21-2012  . Arthritis     Past Surgical History  Procedure Laterality Date  . Knee arthroscopy Right 12-09-2002  . Transthoracic echocardiogram  11-17-2010    MILD LVH/ EF 60-65%/ GRADE I DIASTOLIC DYSFUNCTION/ MILD LAE / MILD RAE  . Abdominal hysterectomy  AGE 3    W/ BILATERAL SALPINGOOPHORECTOMY  . Knee arthroscopy with medial menisectomy Right 10/05/2012    Procedure: KNEE ARTHROSCOPY WITH MEDIAL MENISECTOMY;  Surgeon: Jacki Cones, MD;  Location: [facility] ;  Service: Orthopedics;  Laterality: Right;    Family History  Problem Relation Age of Onset  . Heart attack      History  Substance Use Topics  . Smoking status: Never Smoker   . Smokeless tobacco: Never Used  . Alcohol Use: No    OB  History   Grav Para Term Preterm Abortions TAB SAB Ect Mult Living                  Review of Systems  Respiratory: Positive for shortness of breath.   All other systems reviewed and are negative.    Allergies  Oxycodone and Tramadol  Home Medications   Current Outpatient Rx  Name  Route  Sig  Dispense  Refill  . acetaminophen (TYLENOL) 500 MG tablet   Oral   Take 500 mg by mouth every 6 (six) hours as needed for pain.         . Cholecalciferol (VITAMIN D3) 5000 UNITS CAPS   Oral   Take 1 capsule by mouth daily.         Marland Kitchen HYDROmorphone (DILAUDID) 2 MG tablet   Oral   Take 1 tablet (2 mg total) by mouth every 4 (four) hours as needed for pain.   40 tablet   0   . losartan-hydrochlorothiazide (HYZAAR) 100-25 MG per tablet   Oral   Take 1 tablet by mouth every morning.          . Magnesium Oxide 250 MG TABS   Oral   Take 1 tablet by mouth daily.          . meloxicam (MOBIC) 7.5 MG tablet   Oral   Take 7.5 mg by mouth 2 (two)  times daily.          . metoprolol (LOPRESSOR) 50 MG tablet   Oral   Take 1 tablet (50 mg total) by mouth 2 (two) times daily.   60 tablet   3   . pravastatin (PRAVACHOL) 40 MG tablet   Oral   Take 40 mg by mouth every morning.          . rosuvastatin (CRESTOR) 20 MG tablet   Oral   Take 20 mg by mouth every morning.            BP 136/95  Pulse 93  Temp(Src) 98.3 F (36.8 C) (Oral)  Resp 22  SpO2 94%  Physical Exam  Nursing note and vitals reviewed. Constitutional: She is oriented to person, place, and time. She appears well-developed and well-nourished.  Non-toxic appearance. No distress.  HENT:  Head: Normocephalic and atraumatic.  Eyes: Conjunctivae, EOM and lids are normal. Pupils are equal, round, and reactive to light.  Neck: Normal range of motion. Neck supple. No tracheal deviation present. No mass present.  Cardiovascular: Normal rate, regular rhythm and normal heart sounds.  Exam reveals no gallop.    No murmur heard. Pulmonary/Chest: Effort normal. No stridor. No respiratory distress. She has decreased breath sounds. She has no wheezes. She has no rhonchi. She has no rales.  Abdominal: Soft. Normal appearance and bowel sounds are normal. She exhibits no distension. There is no tenderness. There is no rebound and no CVA tenderness.  Musculoskeletal: Normal range of motion. She exhibits no edema and no tenderness.  Neurological: She is alert and oriented to person, place, and time. She has normal strength. No cranial nerve deficit or sensory deficit. GCS eye subscore is 4. GCS verbal subscore is 5. GCS motor subscore is 6.  Skin: Skin is warm and dry. No abrasion and no rash noted.  Psychiatric: She has a normal mood and affect. Her speech is normal and behavior is normal.    ED Course  Procedures (including critical care time)  Labs Reviewed  CBC  BASIC METABOLIC PANEL  PRO B NATRIURETIC PEPTIDE   No results found.   No diagnosis found.    MDM   Date: 11/21/2012  Rate: 98  Rhythm: normal sinus rhythm  QRS Axis: normal  Intervals: normal  ST/T Wave abnormalities: nonspecific ST changes  Conduction Disutrbances:bifasicular block  Narrative Interpretation:   Old EKG Reviewed: unchanged   8:23 PM Chest ct shows pe, heparin ordered per pharmacy, triad to see       Toy Baker, MD 11/21/12 2023

## 2012-11-21 NOTE — H&P (Signed)
[facility] History and Physical  CASEY MAXFIELD WUJ:[id number] DOB: 02-21-1951 DOA: 11/21/2012  Referring physician: ED PCP: Pcp Not In System  Specialists: None  Chief Complaint: SOB  HPI: Kerry Liu is a 62 y.o. female who presents to the ED with c/o SOB x3 days that is worse with exertion and better at rest.  No fever, no cough.  Recently had knee surgery last month and was not very active or ambulatory for a week after that.  Went to her PCP today and noted to be hypoxemic to 93% desating to the upper 80s on ambulation and so was sent into the ED.  Denies any chest pain.  No treatments PTA.  In the ED CTA chest demonstrated bilateral PEs with heavy clot burden.  Hospitalist has been asked to admit.  Review of Systems: 12 systems reviewed and otherwise negative.  Past Medical History  Diagnosis Date  . HLD (hyperlipidemia)   . Acute meniscal tear of knee     RIGHT  . History of DVT (deep vein thrombosis)     IN PREGNANCY  . RBBB (right bundle branch block with left anterior fascicular block)   . Sinus tachycardia   . Seasonal allergies   . HTN (hypertension)     CARDIOLOGIST--  DR HOCHREIN-- CLEARANCE NOTE IN EPIC AND CHART FROM 08-21-2012  . Arthritis    Past Surgical History  Procedure Laterality Date  . Knee arthroscopy Right 12-09-2002  . Transthoracic echocardiogram  11-17-2010    MILD LVH/ EF 60-65%/ GRADE I DIASTOLIC DYSFUNCTION/ MILD LAE / MILD RAE  . Abdominal hysterectomy  AGE 82    W/ BILATERAL SALPINGOOPHORECTOMY  . Knee arthroscopy with medial menisectomy Right 10/05/2012    Procedure: KNEE ARTHROSCOPY WITH MEDIAL MENISECTOMY;  Surgeon: Jacki Cones, MD;  Location: [facility] [place];  Service: Orthopedics;  Laterality: Right;   Social History:  reports that she has never smoked. She has never used smokeless tobacco. She reports that she does not drink alcohol or use illicit drugs.   Allergies  Allergen Reactions  . Oxycodone Rash  .  Tramadol Rash    Family History  Problem Relation Age of Onset  . Heart attack      Prior to Admission medications   Medication Sig Start Date End Date Taking? Authorizing Provider  acetaminophen (TYLENOL) 500 MG tablet Take 500 mg by mouth every 6 (six) hours as needed for pain.   Yes Historical Provider, MD  Cholecalciferol (VITAMIN D3) 5000 UNITS CAPS Take 1 capsule by mouth daily.   Yes Historical Provider, MD  HYDROmorphone (DILAUDID) 2 MG tablet Take 2 mg by mouth daily.   Yes Historical Provider, MD  losartan-hydrochlorothiazide (HYZAAR) 100-25 MG per tablet Take 1 tablet by mouth every morning.  08/07/12  Yes Historical Provider, MD  Magnesium Oxide 250 MG TABS Take 1 tablet by mouth daily.    Yes Historical Provider, MD  meloxicam (MOBIC) 7.5 MG tablet Take 7.5 mg by mouth daily.  07/30/12  Yes Historical Provider, MD  metoprolol (LOPRESSOR) 50 MG tablet Take 1 tablet (50 mg total) by mouth 2 (two) times daily. 08/16/12  Yes Rollene Rotunda, MD  pravastatin (PRAVACHOL) 40 MG tablet Take 40 mg by mouth every morning.    Yes Historical Provider, MD   Physical Exam: Filed Vitals:   11/21/12 1930 11/21/12 1945 11/21/12 2000 11/21/12 2015  BP: 140/84 133/79 140/94 145/94  Pulse: 93 85 91 89  Temp:      TempSrc:  Resp: 22 20 20 16   SpO2: 97% 96% 97% 96%    General:  NAD, resting comfortably in bed Eyes: PEERLA EOMI ENT: mucous membranes moist Neck: supple w/o JVD Cardiovascular: RRR w/o MRG Respiratory: CTA B Abdomen: soft, nt, nd, bs+ Skin: no rash nor lesion Musculoskeletal: MAE, full ROM all 4 extremities Psychiatric: normal tone and affect Neurologic: AAOx3, grossly non-focal  Labs on Admission:  Basic Metabolic Panel:  Recent Labs Lab 11/21/12 1732  NA 141  K 3.8  CL 103  CO2 25  GLUCOSE 114*  BUN 19  CREATININE 0.86  CALCIUM 9.7   Liver Function Tests: No results found for this basename: AST, ALT, ALKPHOS, BILITOT, PROT, ALBUMIN,  in the last 168  hours No results found for this basename: LIPASE, AMYLASE,  in the last 168 hours No results found for this basename: AMMONIA,  in the last 168 hours CBC:  Recent Labs Lab 11/21/12 1732  WBC 6.7  HGB 14.0  HCT 41.0  MCV 87.0  PLT 196   Cardiac Enzymes: No results found for this basename: CKTOTAL, CKMB, CKMBINDEX, TROPONINI,  in the last 168 hours  BNP (last 3 results)  Recent Labs  11/21/12 1732  PROBNP 3644.0*   CBG: No results found for this basename: GLUCAP,  in the last 168 hours  Radiological Exams on Admission: Ct Angio Chest Pe W/cm &/or Wo Cm  11/21/2012   *RADIOLOGY REPORT*  Clinical Data: Right knee surgery 5 weeks prior.  Decreased oxygen saturation.  Short of breath.  CT ANGIOGRAPHY CHEST  Technique:  Multidetector CT imaging of the chest using the standard protocol during bolus administration of intravenous contrast. Multiplanar reconstructed images including MIPs were obtained and reviewed to evaluate the vascular anatomy.  Contrast: OMNIPAQUE IOHEXOL 350 MG/ML SOLN  Comparison: Chest radiograph 11/13/2012  Findings: There is a large filling defects spanning the left and right main pulmonary arteries consistent with central pulmonary embolism.  The pulmonary emboli extend into the proximal interlobar branches of the left and right lower lobes.  Emboli also extend into the right upper lobe pulmonary artery branches.  There is overall clot burden is severe.  There is no evidence of right heart strain.  No pericardial fluid.  No acute findings of the aorta.  Review of the lung parenchyma demonstrates no pulmonary infarction. No pleural fluid or pneumothorax.  There is small 4 mm right middle lobe pulmonary nodule (image 40).  There is no axillary or supraclavicular lymphadenopathy.  No mediastinal or hilar adenopathy.  Limited view upper abdomen is unremarkable.  Limited view of the skeleton is unremarkable.  IMPRESSION:  1.  Large bilateral central pulmonary emboli.   Overall clot burden is severe. 2.  No evidence of pulmonary infarction. 3.  Small right middle lobe pulmonary nodule. If the patient is at high risk for bronchogenic carcinoma, follow-up chest CT at 1 year is recommended.  If the patient is at low risk, no follow-up is needed.  This recommendation follows the consensus statement: Guidelines for Management of Small Pulmonary Nodules Detected on CT Scans:  A Statement from the Fleischner Society as published in Radiology 2005; 237:395-400.  Critical results were conveyed to Dr. Freida Busman on 11/13/2012 at 1730 hours   Original Report Authenticated By: Genevive Bi, M.D.   [facility] 1 View  11/21/2012   *RADIOLOGY REPORT*  Clinical Data: Shortness of breath.  PORTABLE CHEST - 1 VIEW  Comparison: None.  Findings: The heart is mildly enlarged.  Lung volumes  are low.  No edema or focal consolidation is identified.  No evidence of pleural fluid.  IMPRESSION: Mild cardiomegaly.  No active disease.   Original Report Authenticated By: Irish Lack, M.D.    EKG: Independently reviewed.  Assessment/Plan Principal Problem:   PE (pulmonary embolism) Active Problems:   HYPERTENSION   1. PE - heparin gtt per pharm consult, patient likely to go home on Xarelto vs coumadin, will hold off on selecting one of these meds for now until care management (pharmacy didn't know) determines what patients insurance will cover (have put in consult to them for AM).  2D echo, LE venous dopplers ordered. 2. HTN - continue home meds    Code Status: Full Code (must indicate code status--if unknown or must be presumed, indicate so) Family Communication: Spoke with family at bedside (indicate person spoken with, if applicable, with phone number if by telephone) Disposition Plan: Admit to inpatient (indicate anticipated LOS)  Time spent: 70 min  GARDNER, JARED M. [facility] Pager [phone]  If 7PM-7AM, please contact night-coverage [email or web address] Password  [facility] 11/21/2012, 9:09 PM

## 2012-11-21 NOTE — Progress Notes (Signed)
ANTICOAGULATION CONSULT NOTE - Initial Consult  Pharmacy Consult for Heparin Indication: pulmonary embolus  Allergies  Allergen Reactions  . Oxycodone Rash  . Tramadol Rash    Patient Measurements:   Ht: 62 in Wt: 119 kg  Heparin Dosing Weight: 80 kg  Vital Signs: Temp: 98.3 F (36.8 C) (05/28 1652) Temp src: Oral (05/28 1652) BP: 145/94 mmHg (05/28 2015) Pulse Rate: 89 (05/28 2015)  Labs:  Recent Labs  11/21/12 1732  HGB 14.0  HCT 41.0  PLT 196  CREATININE 0.86    The CrCl is unknown because both a height and weight (above a minimum accepted value) are required for this calculation.   Medical History: Past Medical History  Diagnosis Date  . HLD (hyperlipidemia)   . Acute meniscal tear of knee     RIGHT  . History of DVT (deep vein thrombosis)     IN PREGNANCY  . RBBB (right bundle branch block with left anterior fascicular block)   . Sinus tachycardia   . Seasonal allergies   . HTN (hypertension)     CARDIOLOGIST--  DR HOCHREIN-- CLEARANCE NOTE IN EPIC AND CHART FROM 08-21-2012  . Arthritis     Assessment: 62 y/o F s/p knee surgery ~ 1 month ago presents with SOB. Found to have LARGE bilateral central pulmonary emboli with SEVERE clot burden. To start heparin per pharmacy. CBC good, renal function good, will order PT/INR, aPTT, and will not delay heparin start. No recent bleeding per patient.   Goal of Therapy:  Heparin level 0.3-0.7 units/ml Monitor platelets by anticoagulation protocol: Yes   Plan:  -Heparin 4800 units BOLUS x 1 -Start heparin infusion at 1250 units/hr -Get first heparin level with AM labs at 0500, then daily CBC/HL -Monitor for bleeding -F/U plans for oral anticoagulation  Abran Duke, PharmD Clinical Pharmacist Phone: [phone] Pager: [phone] 11/21/2012 8:23 PM

## 2012-11-22 ENCOUNTER — Encounter (HOSPITAL_COMMUNITY): Payer: Self-pay | Admitting: Radiology

## 2012-11-22 DIAGNOSIS — E669 Obesity, unspecified: Secondary | ICD-10-CM

## 2012-11-22 DIAGNOSIS — I517 Cardiomegaly: Secondary | ICD-10-CM

## 2012-11-22 DIAGNOSIS — I2699 Other pulmonary embolism without acute cor pulmonale: Secondary | ICD-10-CM

## 2012-11-22 LAB — CBC
HCT: 38.6 % (ref 36.0–46.0)
Hemoglobin: 12.7 g/dL (ref 12.0–15.0)
MCH: 28.9 pg (ref 26.0–34.0)
MCHC: 32.9 g/dL (ref 30.0–36.0)
MCV: 87.7 fL (ref 78.0–100.0)
RBC: 4.4 MIL/uL (ref 3.87–5.11)

## 2012-11-22 LAB — BASIC METABOLIC PANEL
BUN: 20 mg/dL (ref 6–23)
CO2: 26 mEq/L (ref 19–32)
Calcium: 9.3 mg/dL (ref 8.4–10.5)
Creatinine, Ser: 0.89 mg/dL (ref 0.50–1.10)
GFR calc non Af Amer: 70 mL/min — ABNORMAL LOW (ref >90)
Glucose, Bld: 104 mg/dL — ABNORMAL HIGH (ref 70–99)

## 2012-11-22 LAB — HOMOCYSTEINE: Homocysteine: 14 umol/L (ref 4.0–15.4)

## 2012-11-22 MED ORDER — RIVAROXABAN 20 MG PO TABS: 20.0000 mg | ORAL_TABLET | Freq: Every day | ORAL | Status: DC

## 2012-11-22 MED ORDER — RIVAROXABAN 15 MG PO TABS
15.0000 mg | ORAL_TABLET | Freq: Once | ORAL | Status: AC
  Administered 2012-11-22: 15 mg via ORAL
  Filled 2012-11-22: qty 1

## 2012-11-22 MED ORDER — RIVAROXABAN 15 MG PO TABS
15.0000 mg | ORAL_TABLET | Freq: Two times a day (BID) | ORAL | Status: DC
  Administered 2012-11-22 – 2012-11-25 (×7): 15 mg via ORAL
  Filled 2012-11-22 (×8): qty 1

## 2012-11-22 MED ORDER — RIVAROXABAN 15 MG PO TABS
15.0000 mg | ORAL_TABLET | Freq: Two times a day (BID) | ORAL | Status: DC
  Filled 2012-11-22 (×2): qty 1

## 2012-11-22 MED ORDER — HEPARIN BOLUS VIA INFUSION
1500.0000 [IU] | Freq: Once | INTRAVENOUS | Status: AC
  Administered 2012-11-22: 1500 [IU] via INTRAVENOUS
  Filled 2012-11-22: qty 1500

## 2012-11-22 NOTE — Progress Notes (Signed)
[facility] PROGRESS NOTE  WENDELIN READER ZOX:[id number] DOB: 1951-03-11 DOA: 11/21/2012 PCP: Nadean Corwin, MD  Assessment/Plan: 1. 1. PE - heparin gtt per pharm consult, patient likely to go hOME ON XARELTO. 2D echo, LE venous dopplers ordered. 2. HTN - continue home meds Code Status: Full Code (must indicate code status--if unknown or must be presumed, indicate so)  Family Communication: Spoke with family at bedside (indicate person spoken with, if applicable, with phone number if by telephone)  Disposition Plan: Admit to inpatient (indicate anticipated LOS   Consultants:  IR  Procedures:  IVC filter placement.   Antibiotics:  none  HPI/Subjective:  Comfortable.   Objective: Filed Vitals:   11/21/12 2315 11/22/12 0011 11/22/12 0502 11/22/12 1400  BP: 152/90  145/88 115/71  Pulse: 84  65 63  Temp: 98.4 F (36.9 C)  97.6 F (36.4 C) 97.9 F (36.6 C)  TempSrc: Oral  Oral Oral  Resp: 20  19 18   Height:  5\' 2"  (1.575 m)    Weight:  117.5 kg (259 lb 0.7 oz)    SpO2: 98%  97% 99%    Intake/Output Summary (Last 24 hours) at 11/22/12 1816 Last data filed at 11/22/12 1700  Gross per 24 hour  Intake    963 ml  Output    820 ml  Net    143 ml   Filed Weights   11/22/12 0011  Weight: 117.5 kg (259 lb 0.7 oz)    Exam:  Cardiovascular: RRR w/o MRG  Respiratory: CTA B  Abdomen: soft, nt, nd, bs+  Skin: no rash nor lesion   Data Reviewed: Basic Metabolic Panel:  Recent Labs Lab 11/21/12 1732 11/22/12 0535  NA 141 141  K 3.8 3.8  CL 103 106  CO2 25 26  GLUCOSE 114* 104*  BUN 19 20  CREATININE 0.86 0.89  CALCIUM 9.7 9.3   Liver Function Tests: No results found for this basename: AST, ALT, ALKPHOS, BILITOT, PROT, ALBUMIN,  in the last 168 hours No results found for this basename: LIPASE, AMYLASE,  in the last 168 hours No results found for this basename: AMMONIA,  in the last 168 hours CBC:  Recent Labs Lab 11/21/12 1732  11/22/12 0535  WBC 6.7 6.9  HGB 14.0 12.7  HCT 41.0 38.6  MCV 87.0 87.7  PLT 196 181   Cardiac Enzymes: No results found for this basename: CKTOTAL, CKMB, CKMBINDEX, TROPONINI,  in the last 168 hours BNP (last 3 results)  Recent Labs  11/21/12 1732  PROBNP 3644.0*   CBG: No results found for this basename: GLUCAP,  in the last 168 hours  No results found for this or any previous visit (from the past 240 hour(s)).   Studies: Ct Angio Chest Pe W/cm &/or Wo Cm  11/21/2012   *RADIOLOGY REPORT*  Clinical Data: Right knee surgery 5 weeks prior.  Decreased oxygen saturation.  Short of breath.  CT ANGIOGRAPHY CHEST  Technique:  Multidetector CT imaging of the chest using the standard protocol during bolus administration of intravenous contrast. Multiplanar reconstructed images including MIPs were obtained and reviewed to evaluate the vascular anatomy.  Contrast: OMNIPAQUE IOHEXOL 350 MG/ML SOLN  Comparison: Chest radiograph 11/13/2012  Findings: There is a large filling defects spanning the left and right main pulmonary arteries consistent with central pulmonary embolism.  The pulmonary emboli extend into the proximal interlobar branches of the left and right lower lobes.  Emboli also extend into the right upper lobe pulmonary artery branches.  There is overall clot burden is severe.  There is no evidence of right heart strain.  No pericardial fluid.  No acute findings of the aorta.  Review of the lung parenchyma demonstrates no pulmonary infarction. No pleural fluid or pneumothorax.  There is small 4 mm right middle lobe pulmonary nodule (image 40).  There is no axillary or supraclavicular lymphadenopathy.  No mediastinal or hilar adenopathy.  Limited view upper abdomen is unremarkable.  Limited view of the skeleton is unremarkable.  IMPRESSION:  1.  Large bilateral central pulmonary emboli.  Overall clot burden is severe. 2.  No evidence of pulmonary infarction. 3.  Small right middle lobe  pulmonary nodule. If the patient is at high risk for bronchogenic carcinoma, follow-up chest CT at 1 year is recommended.  If the patient is at low risk, no follow-up is needed.  This recommendation follows the consensus statement: Guidelines for Management of Small Pulmonary Nodules Detected on CT Scans:  A Statement from the Fleischner Society as published in Radiology 2005; 237:395-400.  Critical results were conveyed to Dr. Freida Busman on 11/13/2012 at 1730 hours   Original Report Authenticated By: Genevive Bi, M.D.   [facility] 1 View  11/21/2012   *RADIOLOGY REPORT*  Clinical Data: Shortness of breath.  PORTABLE CHEST - 1 VIEW  Comparison: None.  Findings: The heart is mildly enlarged.  Lung volumes are low.  No edema or focal consolidation is identified.  No evidence of pleural fluid.  IMPRESSION: Mild cardiomegaly.  No active disease.   Original Report Authenticated By: Irish Lack, M.D.    Scheduled Meds: . hydrochlorothiazide  25 mg Oral Daily  . losartan  100 mg Oral Daily  . magnesium oxide  200 mg Oral Daily  . meloxicam  7.5 mg Oral Daily  . metoprolol  50 mg Oral BID  . rivaroxaban  15 mg Oral BID WC   Followed by  . [START ON 12/13/2012] rivaroxaban  20 mg Oral Q supper  . simvastatin  20 mg Oral q1800  . sodium chloride  3 mL Intravenous Q12H   Continuous Infusions:   Principal Problem:   PE (pulmonary embolism) Active Problems:   HYPERTENSION    Time spent: 40 min    Jevon Shells  [facility] Pager [phone]. If 7PM-7AM, please contact night-coverage at [email or web address], password [facility] 11/22/2012, 6:16 PM  LOS: 1 day

## 2012-11-22 NOTE — Progress Notes (Signed)
  Echocardiogram 2D Echocardiogram has been performed.  Cathie Beams 11/22/2012, 9:18 AM

## 2012-11-22 NOTE — Progress Notes (Signed)
ANTICOAGULATION CONSULT NOTE - Follow Up Consult  Pharmacy Consult for Heparin Indication: pulmonary embolus  Allergies  Allergen Reactions  . Oxycodone Rash  . Tramadol Rash    Patient Measurements: Height: 5\' 2"  (157.5 cm) Weight: 259 lb 0.7 oz (117.5 kg) IBW/kg (Calculated) : 50.1 Ht: 62 in Wt: 119 kg  Heparin Dosing Weight: 80 kg  Vital Signs: Temp: 97.6 F (36.4 C) (05/29 0502) Temp src: Oral (05/29 0502) BP: 145/88 mmHg (05/29 0502) Pulse Rate: 65 (05/29 0502)  Labs:  Recent Labs  11/21/12 1732 11/21/12 2020 11/22/12 0535  HGB 14.0  --  12.7  HCT 41.0  --  38.6  PLT 196  --  181  APTT  --  34  --   LABPROT  --  14.1  --   INR  --  1.10  --   HEPARINUNFRC  --   --  0.26*  CREATININE 0.86  --   --     Estimated Creatinine Clearance: 85.7 ml/min (by C-G formula based on Cr of 0.86).   Medical History: Past Medical History  Diagnosis Date  . HLD (hyperlipidemia)   . Acute meniscal tear of knee     RIGHT  . History of DVT (deep vein thrombosis)     IN PREGNANCY  . RBBB (right bundle branch block with left anterior fascicular block)   . Sinus tachycardia   . Seasonal allergies   . HTN (hypertension)     CARDIOLOGIST--  DR HOCHREIN-- CLEARANCE NOTE IN EPIC AND CHART FROM 08-21-2012  . Pulmonary embolism     "3 today" (11/21/2012)  . Exertional shortness of breath     "this week" (11/21/2012)  . Arthritis     "right knee" (11/21/2012)    Assessment: 62 y/o F s/p knee surgery ~ 1 month ago presents with SOB. Found to have large bilateral central pulmonary emboli with severe clot burden. To start heparin per pharmacy. Heparin level (0.26) is below-goal on 1250 units/hr. No problem with line / infusion and no bleeding per RN.   Goal of Therapy:  Heparin level 0.3-0.7 units/ml Monitor platelets by anticoagulation protocol: Yes   Plan:  1. Heparin IV bolus 1500 units x 1, then increase IV infusion to 1500 units/hr.  2. Heparin level in 6 hours.     Lorre Munroe, PharmD 11/22/2012 6:19 AM

## 2012-11-22 NOTE — H&P (Signed)
Kerry Liu is an 62 y.o. female.   Chief Complaint: Recent R knee surgery SOB at home CTA 5/28 reveals B PE- severe clot burden B lungs Started on Heparin and Xeralto 5/28 Doppler + RLE DVT x 2 (5/29) Scheduled for retrievable Inferior Vena Cava filter placement 5/30 Per [facility]- to ensure no further clot burden to lungs HPI: HLD; Hx dvt in pregnancy; HTN  Past Medical History  Diagnosis Date  . HLD (hyperlipidemia)   . Acute meniscal tear of knee     RIGHT  . History of DVT (deep vein thrombosis)     IN PREGNANCY  . RBBB (right bundle branch block with left anterior fascicular block)   . Sinus tachycardia   . Seasonal allergies   . HTN (hypertension)     CARDIOLOGIST--  DR HOCHREIN-- CLEARANCE NOTE IN EPIC AND CHART FROM 08-21-2012  . Pulmonary embolism     "3 today" (11/21/2012)  . Exertional shortness of breath     "this week" (11/21/2012)  . Arthritis     "right knee" (11/21/2012)    Past Surgical History  Procedure Laterality Date  . Knee arthroscopy Right 12-09-2002  . Transthoracic echocardiogram  11-17-2010    MILD LVH/ EF 60-65%/ GRADE I DIASTOLIC DYSFUNCTION/ MILD LAE / MILD RAE  . Total abdominal hysterectomy w/ bilateral salpingoophorectomy  ~ 1986    W/ BILATERAL SALPINGOOPHORECTOMY  . Knee arthroscopy with medial menisectomy Right 10/05/2012    Procedure: KNEE ARTHROSCOPY WITH MEDIAL MENISECTOMY;  Surgeon: Jacki Cones, MD;  Location: [facility] [place];  Service: Orthopedics;  Laterality: Right;    Family History  Problem Relation Age of Onset  . Heart attack     Social History:  reports that she has never smoked. She has never used smokeless tobacco. She reports that she does not drink alcohol or use illicit drugs.  Allergies:  Allergies  Allergen Reactions  . Oxycodone Rash  . Tramadol Rash    Medications Prior to Admission  Medication Sig Dispense Refill  . acetaminophen (TYLENOL) 500 MG tablet Take 500 mg by mouth every 6 (six) hours  as needed for pain.      . Cholecalciferol (VITAMIN D3) 5000 UNITS CAPS Take 1 capsule by mouth daily.      Marland Kitchen HYDROmorphone (DILAUDID) 2 MG tablet Take 2 mg by mouth daily.      Marland Kitchen losartan-hydrochlorothiazide (HYZAAR) 100-25 MG per tablet Take 1 tablet by mouth every morning.       . Magnesium Oxide 250 MG TABS Take 1 tablet by mouth daily.       . meloxicam (MOBIC) 7.5 MG tablet Take 7.5 mg by mouth daily.       . metoprolol (LOPRESSOR) 50 MG tablet Take 1 tablet (50 mg total) by mouth 2 (two) times daily.  60 tablet  3  . pravastatin (PRAVACHOL) 40 MG tablet Take 40 mg by mouth every morning.         Results for orders placed during the hospital encounter of 11/21/12 (from the past 48 hour(s))  CBC     Status: None   Collection Time    11/21/12  5:32 PM      Result Value Range   WBC 6.7  4.0 - 10.5 K/uL   RBC 4.71  3.87 - 5.11 MIL/uL   Hemoglobin 14.0  12.0 - 15.0 g/dL   HCT 16.1  09.6 - 04.5 %   MCV 87.0  78.0 - 100.0 fL   MCH 29.7  26.0 - 34.0 pg   MCHC 34.1  30.0 - 36.0 g/dL   RDW 16.1  09.6 - 04.5 %   Platelets 196  150 - 400 K/uL  BASIC METABOLIC PANEL     Status: Abnormal   Collection Time    11/21/12  5:32 PM      Result Value Range   Sodium 141  135 - 145 mEq/L   Potassium 3.8  3.5 - 5.1 mEq/L   Chloride 103  96 - 112 mEq/L   CO2 25  19 - 32 mEq/L   Glucose, Bld 114 (*) 70 - 99 mg/dL   BUN 19  6 - 23 mg/dL   Creatinine, Ser 4.09  0.50 - 1.10 mg/dL   Calcium 9.7  8.4 - 81.1 mg/dL   GFR calc non Af Amer 73 (*) >90 mL/min   GFR calc Af Amer 84 (*) >90 mL/min   Comment:            The eGFR has been calculated     using the CKD EPI equation.     This calculation has not been     validated in all clinical     situations.     eGFR's persistently     <90 mL/min signify     possible Chronic Kidney Disease.  PRO B NATRIURETIC PEPTIDE     Status: Abnormal   Collection Time    11/21/12  5:32 PM      Result Value Range   Pro B Natriuretic peptide (BNP) 3644.0 (*) 0 -  125 pg/mL  POCT I-STAT TROPONIN I     Status: None   Collection Time    11/21/12  5:50 PM      Result Value Range   Troponin i, poc 0.03  0.00 - 0.08 ng/mL   Comment 3            Comment: Due to the release kinetics of cTnI,     a negative result within the first hours     of the onset of symptoms does not rule out     myocardial infarction with certainty.     If myocardial infarction is still suspected,     repeat the test at appropriate intervals.  PROTIME-INR     Status: None   Collection Time    11/21/12  8:20 PM      Result Value Range   Prothrombin Time 14.1  11.6 - 15.2 seconds   INR 1.10  0.00 - 1.49  APTT     Status: None   Collection Time    11/21/12  8:20 PM      Result Value Range   aPTT 34  24 - 37 seconds  HEPARIN LEVEL (UNFRACTIONATED)     Status: Abnormal   Collection Time    11/22/12  5:35 AM      Result Value Range   Heparin Unfractionated 0.26 (*) 0.30 - 0.70 IU/mL   Comment:            IF HEPARIN RESULTS ARE BELOW     EXPECTED VALUES, AND PATIENT     DOSAGE HAS BEEN CONFIRMED,     SUGGEST FOLLOW UP TESTING     OF ANTITHROMBIN III LEVELS.  CBC     Status: None   Collection Time    11/22/12  5:35 AM      Result Value Range   WBC 6.9  4.0 - 10.5 K/uL   RBC 4.40  3.87 -  5.11 MIL/uL   Hemoglobin 12.7  12.0 - 15.0 g/dL   HCT 16.1  09.6 - 04.5 %   MCV 87.7  78.0 - 100.0 fL   MCH 28.9  26.0 - 34.0 pg   MCHC 32.9  30.0 - 36.0 g/dL   RDW 40.9  81.1 - 91.4 %   Platelets 181  150 - 400 K/uL  BASIC METABOLIC PANEL     Status: Abnormal   Collection Time    11/22/12  5:35 AM      Result Value Range   Sodium 141  135 - 145 mEq/L   Potassium 3.8  3.5 - 5.1 mEq/L   Chloride 106  96 - 112 mEq/L   CO2 26  19 - 32 mEq/L   Glucose, Bld 104 (*) 70 - 99 mg/dL   BUN 20  6 - 23 mg/dL   Creatinine, Ser 7.82  0.50 - 1.10 mg/dL   Calcium 9.3  8.4 - 95.6 mg/dL   GFR calc non Af Amer 70 (*) >90 mL/min   GFR calc Af Amer 81 (*) >90 mL/min   Comment:            The  eGFR has been calculated     using the CKD EPI equation.     This calculation has not been     validated in all clinical     situations.     eGFR's persistently     <90 mL/min signify     possible Chronic Kidney Disease.  HOMOCYSTEINE     Status: None   Collection Time    11/22/12  5:35 AM      Result Value Range   Homocysteine 14.0  4.0 - 15.4 umol/L  ANTITHROMBIN III     Status: None   Collection Time    11/22/12  5:35 AM      Result Value Range   AntiThromb III Func 96  75 - 120 %   Ct Angio Chest Pe W/cm &/or Wo Cm  11/21/2012   *RADIOLOGY REPORT*  Clinical Data: Right knee surgery 5 weeks prior.  Decreased oxygen saturation.  Short of breath.  CT ANGIOGRAPHY CHEST  Technique:  Multidetector CT imaging of the chest using the standard protocol during bolus administration of intravenous contrast. Multiplanar reconstructed images including MIPs were obtained and reviewed to evaluate the vascular anatomy.  Contrast: OMNIPAQUE IOHEXOL 350 MG/ML SOLN  Comparison: Chest radiograph 11/13/2012  Findings: There is a large filling defects spanning the left and right main pulmonary arteries consistent with central pulmonary embolism.  The pulmonary emboli extend into the proximal interlobar branches of the left and right lower lobes.  Emboli also extend into the right upper lobe pulmonary artery branches.  There is overall clot burden is severe.  There is no evidence of right heart strain.  No pericardial fluid.  No acute findings of the aorta.  Review of the lung parenchyma demonstrates no pulmonary infarction. No pleural fluid or pneumothorax.  There is small 4 mm right middle lobe pulmonary nodule (image 40).  There is no axillary or supraclavicular lymphadenopathy.  No mediastinal or hilar adenopathy.  Limited view upper abdomen is unremarkable.  Limited view of the skeleton is unremarkable.  IMPRESSION:  1.  Large bilateral central pulmonary emboli.  Overall clot burden is severe. 2.  No  evidence of pulmonary infarction. 3.  Small right middle lobe pulmonary nodule. If the patient is at high risk for bronchogenic carcinoma, follow-up chest CT at  1 year is recommended.  If the patient is at low risk, no follow-up is needed.  This recommendation follows the consensus statement: Guidelines for Management of Small Pulmonary Nodules Detected on CT Scans:  A Statement from the Fleischner Society as published in Radiology 2005; 237:395-400.  Critical results were conveyed to Dr. Freida Busman on 11/13/2012 at 1730 hours   Original Report Authenticated By: Genevive Bi, M.D.   [facility] 1 View  11/21/2012   *RADIOLOGY REPORT*  Clinical Data: Shortness of breath.  PORTABLE CHEST - 1 VIEW  Comparison: None.  Findings: The heart is mildly enlarged.  Lung volumes are low.  No edema or focal consolidation is identified.  No evidence of pleural fluid.  IMPRESSION: Mild cardiomegaly.  No active disease.   Original Report Authenticated By: Irish Lack, M.D.    Review of Systems  Respiratory: Positive for shortness of breath.   Cardiovascular: Negative for chest pain.  Gastrointestinal: Negative for nausea, vomiting and abdominal pain.  Neurological: Positive for weakness.    Blood pressure 115/71, pulse 63, temperature 97.9 F (36.6 C), temperature source Oral, resp. rate 18, height 5\' 2"  (1.575 m), weight 259 lb 0.7 oz (117.5 kg), SpO2 99.00%. Physical Exam  Constitutional: She is oriented to person, place, and time.  Cardiovascular: Normal rate and regular rhythm.   No murmur heard. Respiratory: Effort normal. She has no wheezes.  GI: Soft. Bowel sounds are normal. There is tenderness.  Musculoskeletal: Normal range of motion.  Neurological: She is alert and oriented to person, place, and time.  Skin: Skin is warm.  Psychiatric: She has a normal mood and affect. Her behavior is normal. Judgment and thought content normal.     Assessment/Plan B PE- severe RLE DVT x 2 per doppler  5/29 Pt is on Hep and Xeralto Scheduled for IVC filter To ensure no further burden to lungs Pt aware of procedure benefits and risks and agreeable to proceed Consent signed and in chart  Kambre Messner A 11/22/2012, 4:26 PM

## 2012-11-22 NOTE — Care Management Note (Signed)
    Page 1 of 2   11/23/2012     3:10:07 PM   CARE MANAGEMENT NOTE 11/23/2012  Patient:  Kerry Liu, Kerry Liu   Account Number:  [id number]  Date Initiated:  11/22/2012  Documentation initiated by:  Tonie Elsey  Subjective/Objective Assessment:   PT ADM ON 5/28 WITH BILAT PULM EMBOLI.  PTA, PT INDEPENDENT, LIVES WITH FAMILY.     Action/Plan:   WILL FOLLOW FOR HOME NEEDS.  WILL DC ON XARELTO; CM WILL PROVIDE XARELTO CAREPATH CARD FOR COPAY ASSISTANCE.   Anticipated DC Date:  11/23/2012   Anticipated DC Plan:  HOME/SELF CARE      DC Planning Services  CM consult  Medication Assistance      Choice offered to / List presented to:             Status of service:  Completed, signed off Medicare Important Message given?   (If response is "NO", the following Medicare IM given date fields will be blank) Date Medicare IM given:   Date Additional Medicare IM given:    Discharge Disposition:  HOME/SELF CARE  Per UR Regulation:  Reviewed for med. necessity/level of care/duration of stay  If discussed at Long Length of Stay Meetings, dates discussed:    Comments:  11/23/12 Clemons Salvucci,RN,BSN [phone] PT GIVEN XARELTO CAREPATH CARD FOR COPAY ASSISTANCE. ANSWERED MULTIPLE QUESTIONS ABOUT XARELTO.  PHARMACIST TO SEE PT AFTER MY VISIT TO CONFIRM THAT PT HAS ALL QUESTIONS ANSWERED.  11/22/12 Gracyn Santillanes,RN,BSN [phone]  PER [facility] SCRIPT [phone]  XAERLTO 20 MG DAILY 30 DAY SUPPLY  COVER- YES CO-PAY-$ 40.00 PHARMACY- [facility], [facility], [facility], [facility] PRIOR APPROVAL -NO

## 2012-11-22 NOTE — Progress Notes (Signed)
VASCULAR LAB PRELIMINARY  PRELIMINARY  PRELIMINARY  PRELIMINARY  Bilateral lower extremity venous Dopplers completed.    Preliminary report:  There is acute, partially occlusive DVT noted in the right popliteal vein and acute, occlusive DVT noted in the bilateral posterior tibial veins.    Lihanna Biever, RVT 11/22/2012, 9:26 AM

## 2012-11-22 NOTE — Progress Notes (Signed)
Utilization Review Completed.Elery Cadenhead T5/29/2014  

## 2012-11-23 ENCOUNTER — Inpatient Hospital Stay (HOSPITAL_COMMUNITY): Payer: BC Managed Care – PPO

## 2012-11-23 LAB — CBC
MCH: 28.7 pg (ref 26.0–34.0)
MCHC: 32.4 g/dL (ref 30.0–36.0)
MCV: 88.6 fL (ref 78.0–100.0)
Platelets: 195 10*3/uL (ref 150–400)
RDW: 12.8 % (ref 11.5–15.5)

## 2012-11-23 MED ORDER — MIDAZOLAM HCL 2 MG/2ML IJ SOLN
INTRAMUSCULAR | Status: AC | PRN
  Administered 2012-11-23: 1 mg via INTRAVENOUS

## 2012-11-23 MED ORDER — HYDROMORPHONE HCL 2 MG PO TABS
1.0000 mg | ORAL_TABLET | ORAL | Status: DC | PRN
  Administered 2012-11-23 – 2012-11-25 (×6): 1 mg via ORAL
  Filled 2012-11-23 (×7): qty 1

## 2012-11-23 MED ORDER — HYDROMORPHONE HCL PF 1 MG/ML IJ SOLN: 0.5000 mg | INTRAMUSCULAR | Status: DC | PRN

## 2012-11-23 MED ORDER — FENTANYL CITRATE 0.05 MG/ML IJ SOLN
INTRAMUSCULAR | Status: AC | PRN
  Administered 2012-11-23 (×2): 25 ug via INTRAVENOUS

## 2012-11-23 MED ORDER — IOHEXOL 300 MG/ML  SOLN
100.0000 mL | Freq: Once | INTRAMUSCULAR | Status: AC | PRN
  Administered 2012-11-23: 40 mL via INTRAVENOUS

## 2012-11-23 NOTE — Progress Notes (Signed)
[facility] PROGRESS NOTE  Kerry Liu:[id number] DOB: 1950-12-09 DOA: 11/21/2012 PCP: Nadean Corwin, MD  Assessment/Plan: 1. PE - large bilateral PE found on CT angiogram of the chest. The clot burden is severe. Venous dopplers show DVT. Her echo did not assess right ventricular function due to poor visualization. IR consulted for IVC filter placement because of the severe clot burden and to prevent further PE'S. She is currently on xarelto. Continue to monitor.  2. HTN - continue home meds. BP is better controlled.   Code Status: Full Code Family Communication: Spoke with family at bedside Disposition Plan: possibly home.    Consultants:  IR  Procedures:  IVC filter placement.   Antibiotics:  none  HPI/Subjective:  Comfortable.   Objective: Filed Vitals:   11/23/12 0957 11/23/12 1049 11/23/12 1155 11/23/12 1355  BP: 143/94 138/80 125/70 115/58  Pulse: 50 52 59 71  Temp:    97.8 F (36.6 C)  TempSrc:    Oral  Resp: 16 18  18   Height:      Weight:      SpO2: 100% 99%  98%    Intake/Output Summary (Last 24 hours) at 11/23/12 1423 Last data filed at 11/23/12 1300  Gross per 24 hour  Intake    720 ml  Output    400 ml  Net    320 ml   Filed Weights   11/22/12 0011  Weight: 117.5 kg (259 lb 0.7 oz)    Exam:  Cardiovascular: RRR w/o MRG  Respiratory: CTA B  Abdomen: soft, nt, nd, bs+  Skin: no rash nor lesion   Data Reviewed: Basic Metabolic Panel:  Recent Labs Lab 11/21/12 1732 11/22/12 0535  NA 141 141  K 3.8 3.8  CL 103 106  CO2 25 26  GLUCOSE 114* 104*  BUN 19 20  CREATININE 0.86 0.89  CALCIUM 9.7 9.3   Liver Function Tests: No results found for this basename: AST, ALT, ALKPHOS, BILITOT, PROT, ALBUMIN,  in the last 168 hours No results found for this basename: LIPASE, AMYLASE,  in the last 168 hours No results found for this basename: AMMONIA,  in the last 168 hours CBC:  Recent Labs Lab 11/21/12 1732  11/22/12 0535 11/23/12 0430  WBC 6.7 6.9 6.3  HGB 14.0 12.7 12.3  HCT 41.0 38.6 38.0  MCV 87.0 87.7 88.6  PLT 196 181 195   Cardiac Enzymes: No results found for this basename: CKTOTAL, CKMB, CKMBINDEX, TROPONINI,  in the last 168 hours BNP (last 3 results)  Recent Labs  11/21/12 1732  PROBNP 3644.0*   CBG: No results found for this basename: GLUCAP,  in the last 168 hours  Recent Results (from the past 240 hour(s))  SURGICAL PCR SCREEN     Status: None   Collection Time    11/22/12  8:05 PM      Result Value Range Status   MRSA, PCR NEGATIVE  NEGATIVE Final   Staphylococcus aureus NEGATIVE  NEGATIVE Final   Comment:            The Xpert SA Assay (FDA     approved for NASAL specimens     in patients over 25 years of age),     is one component of     a comprehensive surveillance     program.  Test performance has     been validated by [employer] for patients greater     than or equal to  87 year old.     It is not intended     to diagnose infection nor to     guide or monitor treatment.     Studies: Ct Angio Chest Pe W/cm &/or Wo Cm  11/21/2012   *RADIOLOGY REPORT*  Clinical Data: Right knee surgery 5 weeks prior.  Decreased oxygen saturation.  Short of breath.  CT ANGIOGRAPHY CHEST  Technique:  Multidetector CT imaging of the chest using the standard protocol during bolus administration of intravenous contrast. Multiplanar reconstructed images including MIPs were obtained and reviewed to evaluate the vascular anatomy.  Contrast: OMNIPAQUE IOHEXOL 350 MG/ML SOLN  Comparison: Chest radiograph 11/13/2012  Findings: There is a large filling defects spanning the left and right main pulmonary arteries consistent with central pulmonary embolism.  The pulmonary emboli extend into the proximal interlobar branches of the left and right lower lobes.  Emboli also extend into the right upper lobe pulmonary artery branches.  There is overall clot burden is severe.  There is  no evidence of right heart strain.  No pericardial fluid.  No acute findings of the aorta.  Review of the lung parenchyma demonstrates no pulmonary infarction. No pleural fluid or pneumothorax.  There is small 4 mm right middle lobe pulmonary nodule (image 40).  There is no axillary or supraclavicular lymphadenopathy.  No mediastinal or hilar adenopathy.  Limited view upper abdomen is unremarkable.  Limited view of the skeleton is unremarkable.  IMPRESSION:  1.  Large bilateral central pulmonary emboli.  Overall clot burden is severe. 2.  No evidence of pulmonary infarction. 3.  Small right middle lobe pulmonary nodule. If the patient is at high risk for bronchogenic carcinoma, follow-up chest CT at 1 year is recommended.  If the patient is at low risk, no follow-up is needed.  This recommendation follows the consensus statement: Guidelines for Management of Small Pulmonary Nodules Detected on CT Scans:  A Statement from the Fleischner Society as published in Radiology 2005; 237:395-400.  Critical results were conveyed to Dr. Freida Busman on 11/13/2012 at 1730 hours   Original Report Authenticated By: Genevive Bi, M.D.   Ir Ivc Filter Plmt / S&i /img Guid/mod Sed  11/23/2012   *RADIOLOGY REPORT*  Clinical data: Extensive pulmonary emboli.  Recent orthopedic surgery, a relative contraindication to anticoagulation.  INFERIOR VENACAVOGRAM IVC FILTER PLACEMENT UNDER FLUOROSCOPY  Technique and findings: The procedure, risks (including but not limited to bleeding, infection, organ damage), benefits, and alternatives were explained to the patient.  Questions regarding the procedure were encouraged and answered.  The patient understands and consents to the procedure. Patency of the right IJ vein was confirmed with ultrasound with image documentation. An appropriate skin site was determined. Skin site was marked, prepped with chlorhexidine, and draped using maximum barrier technique. The region was infiltrated locally with  1% lidocaine.  Intravenous Fentanyl and Versed were administered as conscious sedation during continuous cardiorespiratory monitoring by the radiology RN, with a total moderate sedation time of 10 minutes.  Under real-time ultrasound guidance, the right IJ vein was accessed with a 21 gauge micropuncture needle; the needle tip within the vein was confirmed with ultrasound image documentation.   The needle was exchanged over a 018 guidewire for a transitional dilator, which allow advancement of the [facility] wire into the IVC. A long 6 French vascular sheath was placed for inferior venacavography. This demonstrated no caval thrombus. Renal vein inflows were evident.  The [facility] IVC filter was advanced through the sheath and successfully  deployed under fluoroscopy at the L2 level. Followup cavagram demonstrates stable filter position  and no evident complication. The sheath was removed and hemostasis achieved at the site. No immediate complication.  Fluoroscopy time: 42 seconds  IMPRESSION: 1.  Normal IVC. No thrombus or significant anatomic variation. 2.  Technically successful infrarenal IVC filter placement. This is a retrievable model.   Original Report Authenticated By: D. Andria Rhein, MD   Dg Chest Port 1 View  11/21/2012   *RADIOLOGY REPORT*  Clinical Data: Shortness of breath.  PORTABLE CHEST - 1 VIEW  Comparison: None.  Findings: The heart is mildly enlarged.  Lung volumes are low.  No edema or focal consolidation is identified.  No evidence of pleural fluid.  IMPRESSION: Mild cardiomegaly.  No active disease.   Original Report Authenticated By: Irish Lack, M.D.    Scheduled Meds: . hydrochlorothiazide  25 mg Oral Daily  . losartan  100 mg Oral Daily  . magnesium oxide  200 mg Oral Daily  . meloxicam  7.5 mg Oral Daily  . metoprolol  50 mg Oral BID  . rivaroxaban  15 mg Oral BID WC   Followed by  . [START ON 12/13/2012] rivaroxaban  20 mg Oral Q supper  . simvastatin  20 mg Oral q1800  . sodium  chloride  3 mL Intravenous Q12H   Continuous Infusions:   Principal Problem:   PE (pulmonary embolism) Active Problems:   HYPERTENSION    Time spent: 40 min    Senai Ramnath  [facility] Pager [phone]. If 7PM-7AM, please contact night-coverage at [email or web address], password [facility] 11/23/2012, 2:23 PM  LOS: 2 days

## 2012-11-23 NOTE — Procedures (Signed)
IVCgram IVC filter No complication No blood loss. See complete dictation in [facility] PACS.  

## 2012-11-24 DIAGNOSIS — M1710 Unilateral primary osteoarthritis, unspecified knee: Secondary | ICD-10-CM

## 2012-11-24 NOTE — Progress Notes (Signed)
[facility] PROGRESS NOTE  Kerry Liu ZOX:[id number] DOB: 02/11/1951 DOA: 11/21/2012 PCP: Kerry Corwin, MD  Assessment/Plan: 1. PE - large bilateral PE found on CT angiogram of the chest. The clot burden is severe. Venous dopplers show DVT. Her echo did not assess right ventricular function due to poor visualization. IR consulted for IVC filter placement because of the severe clot burden and to prevent further PE'S. She underwent IVC filter placement yesterday.  She is currently on xarelto. Continue to monitor.  2. HTN - BP slightly on the lower side. Will hold cozaar .  3. RIGHT Knee surgery: pain control as needed.  DVT prophylaxis.    Code Status: Full Code Family Communication: Spoke with family at bedside Disposition Plan: possibly home tomorrow..    Consultants:  IR  Procedures:  IVC filter placement.   Antibiotics:  none  HPI/Subjective:  Comfortable.   Objective: Filed Vitals:   11/23/12 2124 11/24/12 0553 11/24/12 1010 11/24/12 1405  BP: 126/77 115/75 120/63 97/74  Pulse: 77 55 63 59  Temp: 98.4 F (36.9 C) 97.8 F (36.6 C)  97.4 F (36.3 C)  TempSrc: Oral Oral  Oral  Resp: 20 16  18   Height:      Weight:      SpO2: 97% 99%  98%    Intake/Output Summary (Last 24 hours) at 11/24/12 1650 Last data filed at 11/24/12 1300  Gross per 24 hour  Intake    240 ml  Output    825 ml  Net   -585 ml   Filed Weights   11/22/12 0011  Weight: 117.5 kg (259 lb 0.7 oz)    Exam:  Cardiovascular: RRR w/o MRG  Respiratory: CTA B  Abdomen: soft, nt, nd, bs+  Skin: no rash nor lesion   Data Reviewed: Basic Metabolic Panel:  Recent Labs Lab 11/21/12 1732 11/22/12 0535  NA 141 141  K 3.8 3.8  CL 103 106  CO2 25 26  GLUCOSE 114* 104*  BUN 19 20  CREATININE 0.86 0.89  CALCIUM 9.7 9.3   Liver Function Tests: No results found for this basename: AST, ALT, ALKPHOS, BILITOT, PROT, ALBUMIN,  in the last 168 hours No results found for  this basename: LIPASE, AMYLASE,  in the last 168 hours No results found for this basename: AMMONIA,  in the last 168 hours CBC:  Recent Labs Lab 11/21/12 1732 11/22/12 0535 11/23/12 0430  WBC 6.7 6.9 6.3  HGB 14.0 12.7 12.3  HCT 41.0 38.6 38.0  MCV 87.0 87.7 88.6  PLT 196 181 195   Cardiac Enzymes: No results found for this basename: CKTOTAL, CKMB, CKMBINDEX, TROPONINI,  in the last 168 hours BNP (last 3 results)  Recent Labs  11/21/12 1732  PROBNP 3644.0*   CBG: No results found for this basename: GLUCAP,  in the last 168 hours  Recent Results (from the past 240 hour(s))  SURGICAL PCR SCREEN     Status: None   Collection Time    11/22/12  8:05 PM      Result Value Range Status   MRSA, PCR NEGATIVE  NEGATIVE Final   Staphylococcus aureus NEGATIVE  NEGATIVE Final   Comment:            The Xpert SA Assay (FDA     approved for NASAL specimens     in patients over 70 years of age),     is one component of     a comprehensive surveillance  program.  Test performance has     been validated by [facility] for patients greater     than or equal to 44 year old.     It is not intended     to diagnose infection nor to     guide or monitor treatment.     Studies: Ir Ivc Filter Plmt / S&i /img Guid/mod Sed  11/23/2012   *RADIOLOGY REPORT*  Clinical data: Extensive pulmonary emboli.  Recent orthopedic surgery, a relative contraindication to anticoagulation.  INFERIOR VENACAVOGRAM IVC FILTER PLACEMENT UNDER FLUOROSCOPY  Technique and findings: The procedure, risks (including but not limited to bleeding, infection, organ damage), benefits, and alternatives were explained to the patient.  Questions regarding the procedure were encouraged and answered.  The patient understands and consents to the procedure. Patency of the right IJ vein was confirmed with ultrasound with image documentation. An appropriate skin site was determined. Skin site was marked, prepped with  chlorhexidine, and draped using maximum barrier technique. The region was infiltrated locally with 1% lidocaine.  Intravenous Fentanyl and Versed were administered as conscious sedation during continuous cardiorespiratory monitoring by the radiology RN, with a total moderate sedation time of 10 minutes.  Under real-time ultrasound guidance, the right IJ vein was accessed with a 21 gauge micropuncture needle; the needle tip within the vein was confirmed with ultrasound image documentation.   The needle was exchanged over a 018 guidewire for a transitional dilator, which allow advancement of the [facility] wire into the IVC. A long 6 French vascular sheath was placed for inferior venacavography. This demonstrated no caval thrombus. Renal vein inflows were evident.  The [facility] IVC filter was advanced through the sheath and successfully deployed under fluoroscopy at the L2 level. Followup cavagram demonstrates stable filter position  and no evident complication. The sheath was removed and hemostasis achieved at the site. No immediate complication.  Fluoroscopy time: 42 seconds  IMPRESSION: 1.  Normal IVC. No thrombus or significant anatomic variation. 2.  Technically successful infrarenal IVC filter placement. This is a retrievable model.   Original Report Authenticated By: D. Andria Rhein, MD    Scheduled Meds: . hydrochlorothiazide  25 mg Oral Daily  . losartan  100 mg Oral Daily  . magnesium oxide  200 mg Oral Daily  . meloxicam  7.5 mg Oral Daily  . metoprolol  50 mg Oral BID  . rivaroxaban  15 mg Oral BID WC   Followed by  . [START ON 12/13/2012] rivaroxaban  20 mg Oral Q supper  . simvastatin  20 mg Oral q1800  . sodium chloride  3 mL Intravenous Q12H   Continuous Infusions:   Principal Problem:   PE (pulmonary embolism) Active Problems:   HYPERTENSION    Time spent: 40 min    Kerry Liu  [facility] Pager [phone]. If 7PM-7AM, please contact night-coverage at [email or web address],  password [facility] 11/24/2012, 4:50 PM  LOS: 3 days

## 2012-11-25 MED ORDER — RIVAROXABAN 20 MG PO TABS: 20.0000 mg | ORAL_TABLET | Freq: Every day | ORAL | Status: DC

## 2012-11-25 MED ORDER — RIVAROXABAN 15 MG PO TABS: 15.0000 mg | ORAL_TABLET | Freq: Two times a day (BID) | ORAL | Status: DC

## 2012-11-25 NOTE — Discharge Summary (Signed)
Physician Discharge Summary  Kerry Liu ZOX:[id number] DOB: September 07, 1950 DOA: 11/21/2012  PCP: Kerry Corwin, MD  Admit date: 11/21/2012 Discharge date: 11/25/2012  Time spent: 35  minutes  Recommendations for Outpatient Follow-up:  Follow up with PCP in one week.  Discharge Diagnoses:  Principal Problem:   PE (pulmonary embolism) Active Problems:   HYPERTENSION   Discharge Condition: improved.   Diet recommendation: low sodium diet  Filed Weights   11/22/12 0011  Weight: 117.5 kg (259 lb 0.7 oz)    History of present illness:  Kerry Liu is a 62 y.o. female who presents to the ED with c/o SOB x3 days that is worse with exertion and better at rest. No fever, no cough. Recently had knee surgery last month and was not very active or ambulatory for a week after that. Went to her PCP today and noted to be hypoxemic to 93% desating to the upper 80s on ambulation and so was sent into the ED. Denies any chest pain. No treatments PTA.  In the ED CTA chest demonstrated bilateral PEs with heavy clot burden. Hospitalist has been asked to admit. She is started on IV heparin and later transitioned to po xarelto. She underwent venous duplex of the lower extremities, showed acute DVT. Ir CONSULTED and she underwent IVC filter placement. Echo does not show any  Indication of heart strain.    Hospital Course:  1. PE - large bilateral PE found on CT angiogram of the chest. The clot burden is severe. Venous dopplers show DVT. Her echo did not assess right ventricular function due to poor visualization. IR consulted for IVC filter placement because of the severe clot burden and to prevent further PE'S. She underwent IVC filter placement yesterday. She is currently on xarelto. Recommend follow  Up with PCP in one week.  2. HTN - controlled. Resume home medications.  3. RIGHT Knee surgery: pain control as needed.    Procedures:  IVC filter placement   Consultations:  IR consult.    Discharge Exam: Filed Vitals:   11/24/12 2007 11/24/12 2139 11/25/12 0517 11/25/12 1504  BP: 114/57  132/86 120/76  Pulse: 58 66 58 62  Temp: 97.9 F (36.6 C)  98 F (36.7 C) 97.8 F (36.6 C)  TempSrc: Oral  Oral Oral  Resp: 20  20 20   Height:      Weight:      SpO2: 99%  98% 99%   Cardiovascular: RRR w/o MRG  Respiratory: CTA B  Abdomen: soft, nt, nd, bs+  Skin: no rash nor lesion   Discharge Instructions  Discharge Orders   Future Orders Complete By Expires     Discharge instructions  As directed     Comments:      Follow up with PCP in one week.        Medication List    TAKE these medications       HYDROmorphone 2 MG tablet  Commonly known as:  DILAUDID  Take 2 mg by mouth daily.     losartan-hydrochlorothiazide 100-25 MG per tablet  Commonly known as:  HYZAAR  Take 1 tablet by mouth every morning.     Magnesium Oxide 250 MG Tabs  Take 1 tablet by mouth daily.     meloxicam 7.5 MG tablet  Commonly known as:  MOBIC  Take 7.5 mg by mouth daily.     metoprolol 50 MG tablet  Commonly known as:  LOPRESSOR  Take 1 tablet (50 mg total) by  mouth 2 (two) times daily.     pravastatin 40 MG tablet  Commonly known as:  PRAVACHOL  Take 40 mg by mouth every morning.     Rivaroxaban 15 MG Tabs tablet  Commonly known as:  XARELTO  Take 1 tablet (15 mg total) by mouth 2 (two) times daily with a meal.     Rivaroxaban 20 MG Tabs  Commonly known as:  XARELTO  Take 1 tablet (20 mg total) by mouth daily with supper.  Start taking on:  12/12/2012     TYLENOL 500 MG tablet  Generic drug:  acetaminophen  Take 500 mg by mouth every 6 (six) hours as needed for pain.     Vitamin D3 5000 UNITS Caps  Take 1 capsule by mouth daily.       Allergies  Allergen Reactions  . Oxycodone Rash  . Tramadol Rash       Follow-up Information   Follow up with [facility]. (Home Health RN and Physical Therapy)    Contact information:   [phone]       Follow up with Liu,Kerry DAVID, MD. Schedule an appointment as soon as possible for a visit in 1 week.   Contact information:   [address] [place] [address] [phone]        The results of significant diagnostics from this hospitalization (including imaging, microbiology, ancillary and laboratory) are listed below for reference.    Significant Diagnostic Studies: Ct Angio Chest Pe W/cm &/or Wo Cm  11/21/2012   *RADIOLOGY REPORT*  Clinical Data: Right knee surgery 5 weeks prior.  Decreased oxygen saturation.  Short of breath.  CT ANGIOGRAPHY CHEST  Technique:  Multidetector CT imaging of the chest using the standard protocol during bolus administration of intravenous contrast. Multiplanar reconstructed images including MIPs were obtained and reviewed to evaluate the vascular anatomy.  Contrast: OMNIPAQUE IOHEXOL 350 MG/ML SOLN  Comparison: Chest radiograph 11/13/2012  Findings: There is a large filling defects spanning the left and right main pulmonary arteries consistent with central pulmonary embolism.  The pulmonary emboli extend into the proximal interlobar branches of the left and right lower lobes.  Emboli also extend into the right upper lobe pulmonary artery branches.  There is overall clot burden is severe.  There is no evidence of right heart strain.  No pericardial fluid.  No acute findings of the aorta.  Review of the lung parenchyma demonstrates no pulmonary infarction. No pleural fluid or pneumothorax.  There is small 4 mm right middle lobe pulmonary nodule (image 40).  There is no axillary or supraclavicular lymphadenopathy.  No mediastinal or hilar adenopathy.  Limited view upper abdomen is unremarkable.  Limited view of the skeleton is unremarkable.  IMPRESSION:  1.  Large bilateral central pulmonary emboli.  Overall clot burden is severe. 2.  No evidence of pulmonary infarction. 3.  Small right middle lobe pulmonary nodule. If the patient is at high  risk for bronchogenic carcinoma, follow-up chest CT at 1 year is recommended.  If the patient is at low risk, no follow-up is needed.  This recommendation follows the consensus statement: Guidelines for Management of Small Pulmonary Nodules Detected on CT Scans:  A Statement from the Fleischner Society as published in Radiology 2005; 237:395-400.  Critical results were conveyed to Dr. Freida Busman on 11/13/2012 at 1730 hours   Original Report Authenticated By: Genevive Bi, M.D.   Ir Ivc Filter Plmt / S&i /img Guid/mod Sed  11/23/2012   *RADIOLOGY REPORT*  Clinical data: Extensive pulmonary emboli.  Recent orthopedic surgery, a relative contraindication to anticoagulation.  INFERIOR VENACAVOGRAM IVC FILTER PLACEMENT UNDER FLUOROSCOPY  Technique and findings: The procedure, risks (including but not limited to bleeding, infection, organ damage), benefits, and alternatives were explained to the patient.  Questions regarding the procedure were encouraged and answered.  The patient understands and consents to the procedure. Patency of the right IJ vein was confirmed with ultrasound with image documentation. An appropriate skin site was determined. Skin site was marked, prepped with chlorhexidine, and draped using maximum barrier technique. The region was infiltrated locally with 1% lidocaine.  Intravenous Fentanyl and Versed were administered as conscious sedation during continuous cardiorespiratory monitoring by the radiology RN, with a total moderate sedation time of 10 minutes.  Under real-time ultrasound guidance, the right IJ vein was accessed with a 21 gauge micropuncture needle; the needle tip within the vein was confirmed with ultrasound image documentation.   The needle was exchanged over a 018 guidewire for a transitional dilator, which allow advancement of the [facility] wire into the IVC. A long 6 French vascular sheath was placed for inferior venacavography. This demonstrated no caval thrombus. Renal vein inflows  were evident.  The [facility] IVC filter was advanced through the sheath and successfully deployed under fluoroscopy at the L2 level. Followup cavagram demonstrates stable filter position  and no evident complication. The sheath was removed and hemostasis achieved at the site. No immediate complication.  Fluoroscopy time: 42 seconds  IMPRESSION: 1.  Normal IVC. No thrombus or significant anatomic variation. 2.  Technically successful infrarenal IVC filter placement. This is a retrievable model.   Original Report Authenticated By: D. Andria Rhein, MD   Dg Chest Port 1 View  11/21/2012   *RADIOLOGY REPORT*  Clinical Data: Shortness of breath.  PORTABLE CHEST - 1 VIEW  Comparison: None.  Findings: The heart is mildly enlarged.  Lung volumes are low.  No edema or focal consolidation is identified.  No evidence of pleural fluid.  IMPRESSION: Mild cardiomegaly.  No active disease.   Original Report Authenticated By: Irish Lack, M.D.    Microbiology: Recent Results (from the past 240 hour(s))  SURGICAL PCR SCREEN     Status: None   Collection Time    11/22/12  8:05 PM      Result Value Range Status   MRSA, PCR NEGATIVE  NEGATIVE Final   Staphylococcus aureus NEGATIVE  NEGATIVE Final   Comment:            The Xpert SA Assay (FDA     approved for NASAL specimens     in patients over 91 years of age),     is one component of     a comprehensive surveillance     program.  Test performance has     been validated by [employer] for patients greater     than or equal to 31 year old.     It is not intended     to diagnose infection nor to     guide or monitor treatment.     Labs: Basic Metabolic Panel:  Recent Labs Lab 11/21/12 1732 11/22/12 0535  NA 141 141  K 3.8 3.8  CL 103 106  CO2 25 26  GLUCOSE 114* 104*  BUN 19 20  CREATININE 0.86 0.89  CALCIUM 9.7 9.3   Liver Function Tests: No results found for this basename: AST, ALT, ALKPHOS, BILITOT, PROT, ALBUMIN,  in the last  168  hours No results found for this basename: LIPASE, AMYLASE,  in the last 168 hours No results found for this basename: AMMONIA,  in the last 168 hours CBC:  Recent Labs Lab 11/21/12 1732 11/22/12 0535 11/23/12 0430  WBC 6.7 6.9 6.3  HGB 14.0 12.7 12.3  HCT 41.0 38.6 38.0  MCV 87.0 87.7 88.6  PLT 196 181 195   Cardiac Enzymes: No results found for this basename: CKTOTAL, CKMB, CKMBINDEX, TROPONINI,  in the last 168 hours BNP: BNP (last 3 results)  Recent Labs  11/21/12 1732  PROBNP 3644.0*   CBG: No results found for this basename: GLUCAP,  in the last 168 hours     Signed:  Tena Linebaugh  [facility] 11/25/2012, 4:18 PM

## 2012-11-25 NOTE — Evaluation (Signed)
Physical Therapy Evaluation Patient Details Name: Kerry Liu MRN: [id number] DOB: October 11, 1950 Today's Date: 11/25/2012 Time: 3086-5784 PT Time Calculation (min): 40 min  PT Assessment / Plan / Recommendation Clinical Impression  Patient is a 62 yo female admitted with bil. PE's and bil. DVT's now with IVC filter.  Patient also with recent Rt knee surgery.  Will benefit from acute PT to maximize independence prior to discharge.  Recommend HHPT at discharge for continued therapy for Rt knee and mobility.    PT Assessment  Patient needs continued PT services    Follow Up Recommendations  Home health PT;Supervision/Assistance - 24 hour    Does the patient have the potential to tolerate intense rehabilitation      Barriers to Discharge None      Equipment Recommendations  Other (comment) (3-in-1 BSC)    Recommendations for Other Services     Frequency Min 3X/week    Precautions / Restrictions Precautions Precautions: None Restrictions Weight Bearing Restrictions: No   Pertinent Vitals/Pain       Mobility  Bed Mobility Bed Mobility: Supine to Sit Supine to Sit: 6: Modified independent (Device/Increase time);With rails;HOB flat Transfers Transfers: Sit to Stand;Stand to Sit Sit to Stand: 5: Supervision;With upper extremity assist;From bed Stand to Sit: 5: Supervision;With upper extremity assist;To bed;With armrests;To chair/3-in-1 Details for Transfer Assistance: Patient uses correct hand placement and technique.  Supervision for safety only. Ambulation/Gait Ambulation/Gait Assistance: 5: Supervision Ambulation Distance (Feet): 200 Feet Assistive device: Rolling walker Ambulation/Gait Assistance Details: Verbal cues to try to take longer steps, and minimize weight through UE's on RW.   Gait Pattern: Step-to pattern;Decreased stance time - right;Decreased step length - left;Decreased stride length;Antalgic Gait velocity: Slow gait speed    Exercises     PT Diagnosis:  Abnormality of gait;Difficulty walking;Acute pain  PT Problem List: Decreased strength;Decreased range of motion;Decreased mobility;Decreased knowledge of precautions;Cardiopulmonary status limiting activity;Obesity;Pain PT Treatment Interventions: DME instruction;Gait training;Functional mobility training;Therapeutic exercise;Patient/family education   PT Goals Acute Rehab PT Goals PT Goal Formulation: With patient Time For Goal Achievement: 12/02/12 Potential to Achieve Goals: Good Pt will go Sit to Stand: with modified independence;with upper extremity assist PT Goal: Sit to Stand - Progress: Goal set today Pt will Ambulate: >150 feet;with modified independence;with rolling walker PT Goal: Ambulate - Progress: Goal set today Pt will Perform Home Exercise Program: Independently PT Goal: Perform Home Exercise Program - Progress: Goal set today  Visit Information  Last PT Received On: 11/25/12 Assistance Needed: +1    Subjective Data  Subjective: "I wasn't feeling right.  I'm glad I went to the doctor" Patient Stated Goal: To go home.   Prior Functioning  Home Living Lives With: Other (Comment) (Grandson (56 yo)) Available Help at Discharge: Family;Available 24 hours/day Type of Home: House Home Access: Level entry Home Layout: One level Bathroom Shower/Tub: Engineer, manufacturing systems: Standard Bathroom Accessibility: Yes How Accessible: Accessible via walker Home Adaptive Equipment: Walker - rolling;Quad cane Prior Function Level of Independence: Independent with assistive device(s);Needs assistance Needs Assistance: Bathing;Meal Prep;Light Housekeeping Bath: Moderate Meal Prep: Moderate Light Housekeeping: Maximal Able to Take Stairs?: Yes Driving: Yes Vocation: Full time employment Comments: Works at [employer]: No difficulties    Cognition  Cognition Arousal/Alertness: Awake/alert Behavior During Therapy: WFL for tasks  assessed/performed Overall Cognitive Status: Within Functional Limits for tasks assessed    Extremity/Trunk Assessment Right Upper Extremity Assessment RUE ROM/Strength/Tone: WFL for tasks assessed RUE Sensation: WFL - Light Touch Left Upper Extremity  Assessment LUE ROM/Strength/Tone: WFL for tasks assessed LUE Sensation: WFL - Light Touch Right Lower Extremity Assessment RLE ROM/Strength/Tone: Deficits RLE ROM/Strength/Tone Deficits: Strength 4/5 RLE Sensation: WFL - Light Touch Left Lower Extremity Assessment LLE ROM/Strength/Tone: WFL for tasks assessed LLE Sensation: WFL - Light Touch Trunk Assessment Trunk Assessment: Normal   Balance    End of Session PT - End of Session Equipment Utilized During Treatment: Gait belt Activity Tolerance: Patient tolerated treatment well Patient left: in chair;with call bell/phone within reach Nurse Communication: Mobility status  GP     Vena Austria 11/25/2012, 10:52 AM Durenda Hurt. Renaldo Fiddler, [facility] Acute Rehab Services Pager [phone]

## 2012-11-25 NOTE — Progress Notes (Signed)
NCM spoke to pt and offered choice for [facility]. States she is familiar with [facility] and requesting them for [facility]. Faxed referral to [facility] for [facility]. Pt is requesting 3n1 for home. Contacted DME rep for [facility]. [facility] contact contact info added to dc instructions. Isidoro Donning RN CCM Case Mgmt phone [phone]

## 2012-11-29 LAB — LUPUS ANTICOAGULANT PANEL
DRVVT: 39.4 secs (ref <42.9)
PTT Lupus Anticoagulant: 113.3 secs — ABNORMAL HIGH (ref 28.0–43.0)
PTTLA 4:1 Mix: 73.6 secs — ABNORMAL HIGH (ref 28.0–43.0)
PTTLA Confirmation: 8.9 secs — ABNORMAL HIGH (ref <8.0)

## 2013-01-02 ENCOUNTER — Ambulatory Visit: Payer: BC Managed Care – PPO | Admitting: Physical Therapy

## 2013-01-08 ENCOUNTER — Ambulatory Visit: Payer: BC Managed Care – PPO | Attending: Internal Medicine | Admitting: Physical Therapy

## 2013-01-08 DIAGNOSIS — IMO0001 Reserved for inherently not codable concepts without codable children: Secondary | ICD-10-CM | POA: Insufficient documentation

## 2013-01-08 DIAGNOSIS — M25569 Pain in unspecified knee: Secondary | ICD-10-CM | POA: Insufficient documentation

## 2013-01-08 DIAGNOSIS — R269 Unspecified abnormalities of gait and mobility: Secondary | ICD-10-CM | POA: Insufficient documentation

## 2013-01-08 DIAGNOSIS — M7989 Other specified soft tissue disorders: Secondary | ICD-10-CM | POA: Insufficient documentation

## 2013-01-09 ENCOUNTER — Ambulatory Visit: Payer: BC Managed Care – PPO | Admitting: Physical Therapy

## 2013-01-11 ENCOUNTER — Ambulatory Visit: Payer: BC Managed Care – PPO | Admitting: Physical Therapy

## 2013-01-14 ENCOUNTER — Ambulatory Visit: Payer: BC Managed Care – PPO | Admitting: Physical Therapy

## 2013-01-16 ENCOUNTER — Ambulatory Visit: Payer: BC Managed Care – PPO | Admitting: Physical Therapy

## 2013-01-18 ENCOUNTER — Ambulatory Visit: Payer: BC Managed Care – PPO | Admitting: Physical Therapy

## 2013-01-21 ENCOUNTER — Ambulatory Visit: Payer: BC Managed Care – PPO | Admitting: Physical Therapy

## 2013-01-23 ENCOUNTER — Ambulatory Visit: Payer: BC Managed Care – PPO | Admitting: Physical Therapy

## 2013-01-24 ENCOUNTER — Ambulatory Visit: Payer: BC Managed Care – PPO | Admitting: Physical Therapy

## 2013-01-25 ENCOUNTER — Ambulatory Visit: Payer: BC Managed Care – PPO | Admitting: Physical Therapy

## 2013-03-21 ENCOUNTER — Ambulatory Visit: Payer: BC Managed Care – PPO | Admitting: Physical Therapy

## 2013-03-26 ENCOUNTER — Ambulatory Visit: Payer: BC Managed Care – PPO | Attending: Internal Medicine | Admitting: Physical Therapy

## 2013-03-26 DIAGNOSIS — IMO0001 Reserved for inherently not codable concepts without codable children: Secondary | ICD-10-CM | POA: Insufficient documentation

## 2013-03-26 DIAGNOSIS — R269 Unspecified abnormalities of gait and mobility: Secondary | ICD-10-CM | POA: Insufficient documentation

## 2013-03-26 DIAGNOSIS — M25569 Pain in unspecified knee: Secondary | ICD-10-CM | POA: Insufficient documentation

## 2013-03-26 DIAGNOSIS — M7989 Other specified soft tissue disorders: Secondary | ICD-10-CM | POA: Insufficient documentation

## 2013-03-28 ENCOUNTER — Ambulatory Visit: Payer: BC Managed Care – PPO | Admitting: Physical Therapy

## 2013-04-02 ENCOUNTER — Ambulatory Visit: Payer: BC Managed Care – PPO | Attending: Internal Medicine | Admitting: Physical Therapy

## 2013-04-02 DIAGNOSIS — M7989 Other specified soft tissue disorders: Secondary | ICD-10-CM | POA: Insufficient documentation

## 2013-04-02 DIAGNOSIS — IMO0001 Reserved for inherently not codable concepts without codable children: Secondary | ICD-10-CM | POA: Insufficient documentation

## 2013-04-02 DIAGNOSIS — M25569 Pain in unspecified knee: Secondary | ICD-10-CM | POA: Insufficient documentation

## 2013-04-02 DIAGNOSIS — R269 Unspecified abnormalities of gait and mobility: Secondary | ICD-10-CM | POA: Insufficient documentation

## 2013-04-04 ENCOUNTER — Ambulatory Visit: Payer: BC Managed Care – PPO | Admitting: Physical Therapy

## 2013-04-08 ENCOUNTER — Ambulatory Visit: Payer: BC Managed Care – PPO | Admitting: Physical Therapy

## 2013-04-10 ENCOUNTER — Ambulatory Visit: Payer: BC Managed Care – PPO | Admitting: Physical Therapy

## 2013-04-11 ENCOUNTER — Ambulatory Visit: Payer: BC Managed Care – PPO | Admitting: Physical Therapy

## 2013-04-12 ENCOUNTER — Ambulatory Visit: Payer: BC Managed Care – PPO | Admitting: Rehabilitation

## 2013-04-16 ENCOUNTER — Ambulatory Visit: Payer: BC Managed Care – PPO | Admitting: Physical Therapy

## 2013-04-18 ENCOUNTER — Ambulatory Visit: Payer: BC Managed Care – PPO | Admitting: Physical Therapy

## 2013-04-23 ENCOUNTER — Ambulatory Visit: Payer: BC Managed Care – PPO | Admitting: Physical Therapy

## 2013-04-24 ENCOUNTER — Ambulatory Visit: Payer: BC Managed Care – PPO | Admitting: Physical Therapy

## 2013-04-25 ENCOUNTER — Ambulatory Visit: Payer: BC Managed Care – PPO | Admitting: Physical Therapy

## 2013-05-29 ENCOUNTER — Encounter: Payer: Self-pay | Admitting: Internal Medicine

## 2013-05-29 DIAGNOSIS — E782 Mixed hyperlipidemia: Secondary | ICD-10-CM | POA: Insufficient documentation

## 2013-05-29 DIAGNOSIS — R7303 Prediabetes: Secondary | ICD-10-CM | POA: Insufficient documentation

## 2013-05-29 DIAGNOSIS — Z86718 Personal history of other venous thrombosis and embolism: Secondary | ICD-10-CM | POA: Insufficient documentation

## 2013-05-29 DIAGNOSIS — Z86711 Personal history of pulmonary embolism: Secondary | ICD-10-CM | POA: Insufficient documentation

## 2013-05-29 DIAGNOSIS — J302 Other seasonal allergic rhinitis: Secondary | ICD-10-CM | POA: Insufficient documentation

## 2013-05-29 DIAGNOSIS — E559 Vitamin D deficiency, unspecified: Secondary | ICD-10-CM | POA: Insufficient documentation

## 2013-05-30 ENCOUNTER — Ambulatory Visit (INDEPENDENT_AMBULATORY_CARE_PROVIDER_SITE_OTHER): Payer: BC Managed Care – PPO | Admitting: Emergency Medicine

## 2013-05-30 ENCOUNTER — Ambulatory Visit: Payer: Self-pay | Admitting: Emergency Medicine

## 2013-05-30 ENCOUNTER — Encounter: Payer: Self-pay | Admitting: Emergency Medicine

## 2013-05-30 VITALS — BP 122/82 | HR 84 | Temp 98.0°F | Resp 18 | Wt 265.0 lb

## 2013-05-30 DIAGNOSIS — I10 Essential (primary) hypertension: Secondary | ICD-10-CM

## 2013-05-30 DIAGNOSIS — Z79899 Other long term (current) drug therapy: Secondary | ICD-10-CM

## 2013-05-30 DIAGNOSIS — E669 Obesity, unspecified: Secondary | ICD-10-CM

## 2013-05-30 DIAGNOSIS — R7309 Other abnormal glucose: Secondary | ICD-10-CM

## 2013-05-30 DIAGNOSIS — E559 Vitamin D deficiency, unspecified: Secondary | ICD-10-CM

## 2013-05-30 DIAGNOSIS — E782 Mixed hyperlipidemia: Secondary | ICD-10-CM

## 2013-05-30 DIAGNOSIS — R200 Anesthesia of skin: Secondary | ICD-10-CM

## 2013-05-30 DIAGNOSIS — R05 Cough: Secondary | ICD-10-CM

## 2013-05-30 DIAGNOSIS — E538 Deficiency of other specified B group vitamins: Secondary | ICD-10-CM

## 2013-05-30 LAB — IRON AND TIBC
TIBC: 309 ug/dL (ref 250–470)
UIBC: 238 ug/dL (ref 125–400)

## 2013-05-30 LAB — LIPID PANEL
Cholesterol: 177 mg/dL (ref 0–200)
HDL: 48 mg/dL (ref >39)
Triglycerides: 53 mg/dL (ref <150)
VLDL: 11 mg/dL (ref 0–40)

## 2013-05-30 LAB — CBC WITH DIFFERENTIAL/PLATELET
Eosinophils Relative: 3 % (ref 0–5)
HCT: 40.6 % (ref 36.0–46.0)
Lymphocytes Relative: 23 % (ref 12–46)
Lymphs Abs: 1.2 10*3/uL (ref 0.7–4.0)
MCV: 87.3 fL (ref 78.0–100.0)
Monocytes Absolute: 0.6 10*3/uL (ref 0.1–1.0)
RBC: 4.65 MIL/uL (ref 3.87–5.11)
RDW: 12.5 % (ref 11.5–15.5)
WBC: 5.1 10*3/uL (ref 4.0–10.5)

## 2013-05-30 LAB — BASIC METABOLIC PANEL WITH GFR
BUN: 22 mg/dL (ref 6–23)
Chloride: 103 mEq/L (ref 96–112)
Creat: 0.82 mg/dL (ref 0.50–1.10)
GFR, Est Non African American: 78 mL/min

## 2013-05-30 LAB — HEPATIC FUNCTION PANEL
ALT: 20 U/L (ref 0–35)
Bilirubin, Direct: 0.1 mg/dL (ref 0.0–0.3)
Total Bilirubin: 0.4 mg/dL (ref 0.3–1.2)

## 2013-05-30 LAB — VITAMIN B12: Vitamin B-12: 556 pg/mL (ref 211–911)

## 2013-05-30 LAB — MAGNESIUM: Magnesium: 1.8 mg/dL (ref 1.5–2.5)

## 2013-05-30 MED ORDER — BENZONATATE 100 MG PO CAPS: 100.0000 mg | ORAL_CAPSULE | Freq: Three times a day (TID) | ORAL | Status: DC | PRN

## 2013-05-30 NOTE — Patient Instructions (Signed)

## 2013-06-02 NOTE — Progress Notes (Signed)
Subjective:    Patient ID: Kerry Liu, female    DOB: [birth year], 62 y.o.   MRN: [id number]  HPI Comments: 62 yo Obese female presents for 3 month F/U for HTN, Cholesterol, Pre-Dm, D. Deficient. She has not been eating healthy. She denies exercise. She has gained weight since last OV. LAST ABNORMAL LABS BS 110 A1C 5.8 D 21 MAGNESIUM 1.8   She notes cough x 2 days with clear production. She notes drainage often makes her hoarse. She denies fever or sore throat. She is has not been taking any allergy OTC.  She has noticed increasing tingling of right arm into last 2 fingers for several weeks. Symptoms started when sleeping but has increased to several episodes also during the  Day without obvious triggers. She denies history of trauma or neck injury. She does have history of Iron defieincy in the past.   She is on Xarelto for prophylaxis with PE history and has noticed darkening of skin on both breast. She denies pain or trauma.        Current Outpatient Prescriptions on File Prior to Visit  Medication Sig Dispense Refill  . Cholecalciferol (VITAMIN D3) 5000 UNITS CAPS Take 1 capsule by mouth daily.      Marland Kitchen HYDROmorphone (DILAUDID) 2 MG tablet Take 2 mg by mouth daily.      Marland Kitchen losartan-hydrochlorothiazide (HYZAAR) 100-25 MG per tablet Take 1 tablet by mouth every morning.       . Magnesium Oxide 250 MG TABS Take 1 tablet by mouth daily.       . meloxicam (MOBIC) 7.5 MG tablet Take 7.5 mg by mouth daily.       . metoprolol (LOPRESSOR) 50 MG tablet Take 1 tablet (50 mg total) by mouth 2 (two) times daily.  60 tablet  3  . Rivaroxaban (XARELTO) 20 MG TABS Take 1 tablet (20 mg total) by mouth daily with supper.  30 tablet  0  . rosuvastatin (CRESTOR) 10 MG tablet Take 10 mg by mouth daily.      Marland Kitchen acetaminophen (TYLENOL) 500 MG tablet Take 500 mg by mouth every 6 (six) hours as needed for pain.      . Rivaroxaban (XARELTO) 15 MG TABS tablet Take 1 tablet (15 mg total) by mouth 2 (two) times  daily with a meal.  34 tablet  0   No current facility-administered medications on file prior to visit.   ALLERGIES Hydrocodone; Oxycodone; and Tramadol   Past Medical History  Diagnosis Date  . Acute meniscal tear of knee     RIGHT  . History of DVT (deep vein thrombosis)     IN PREGNANCY  . RBBB (right bundle branch block with left anterior fascicular block)   . Sinus tachycardia   . Pulmonary embolism     "3 today" (11/21/2012)  . Exertional shortness of breath     "this week" (11/21/2012)  . Mixed hyperlipidemia   . HTN (hypertension)     CARDIOLOGIST--  DR HOCHREIN-- CLEARANCE NOTE IN EPIC AND CHART FROM 08-21-2012  . Seasonal allergies   . Arthritis     "right knee" (11/21/2012)  . Elevated hemoglobin A1c   . Obesity   . Vitamin D deficiency     Review of Systems  HENT: Positive for postnasal drip.   Respiratory: Positive for cough.   Skin: Positive for color change and rash.  Neurological: Positive for numbness.  All other systems reviewed and are negative.    BP 122/82  Pulse 84  Temp(Src) 98 F (36.7 C) (Temporal)  Resp 18  Wt 265 lb (120.203 kg)     Objective:   Physical Exam  Nursing note and vitals reviewed. Constitutional: She is oriented to person, place, and time. She appears well-developed and well-nourished. No distress.  OBESE  HENT:  Head: Normocephalic and atraumatic.  Right Ear: External ear normal.  Left Ear: External ear normal.  Nose: Nose normal.  Mouth/Throat: Oropharynx is clear and moist.  Eyes: Conjunctivae and EOM are normal.  Neck: Normal range of motion. Neck supple. No JVD present. No thyromegaly present.  Cardiovascular: Normal rate, regular rhythm, normal heart sounds and intact distal pulses.   Pulmonary/Chest: Effort normal and breath sounds normal.  Abdominal: Soft. Bowel sounds are normal. She exhibits no distension and no mass. There is no tenderness. There is no rebound and no guarding.  Musculoskeletal: Normal range  of motion. She exhibits no edema and no tenderness.  Good ROM of neck/ ARM. Strength equal bilateral  Lymphadenopathy:    She has no cervical adenopathy.  Neurological: She is alert and oriented to person, place, and time. She has normal reflexes. No cranial nerve deficit. She exhibits normal muscle tone. Coordination normal.  No obvious deficit  Skin: Skin is warm and dry. No rash noted. No erythema. No pallor.  Psychiatric: She has a normal mood and affect. Her behavior is normal. Judgment and thought content normal.          Assessment & Plan:  1. 3 month F/U for Obesity, HTN, Cholesterol, Pre-Dm, D. Deficient- Needs wt loss, healthier diet and cardio QD- Check labs 2.  Cough vs Allergies- Check labs, Advise Allegra AD 3. Right arm tingling- Check labs, Sleep hygiene explained, if negative she will call ortho for evaluation 4. Dark skin- Questionable bruising vs rash, continue to monitor w/c if increased symptoms.  SX Xarelto 20 mg given #14

## 2013-07-24 ENCOUNTER — Other Ambulatory Visit: Payer: Self-pay | Admitting: Internal Medicine

## 2013-07-24 MED ORDER — RIVAROXABAN 20 MG PO TABS: 20.0000 mg | ORAL_TABLET | Freq: Every day | ORAL | Status: DC

## 2013-09-04 ENCOUNTER — Other Ambulatory Visit: Payer: Self-pay | Admitting: Cardiology

## 2013-11-13 ENCOUNTER — Other Ambulatory Visit: Payer: Self-pay | Admitting: Emergency Medicine

## 2013-11-13 ENCOUNTER — Telehealth: Payer: Self-pay | Admitting: *Deleted

## 2013-11-13 MED ORDER — RIVAROXABAN 20 MG PO TABS: 20.0000 mg | ORAL_TABLET | Freq: Every day | ORAL | Status: DC

## 2013-11-13 MED ORDER — ROSUVASTATIN CALCIUM 10 MG PO TABS: 10.0000 mg | ORAL_TABLET | Freq: Every day | ORAL | Status: DC

## 2013-11-13 NOTE — Telephone Encounter (Signed)
PT has scheduled a F/U with you for 5/27. She was calling for samples & told her she would need a appt soon so we scheduled & she is asking can you give her any Xarelto & crestor to get her by till her appt?  Just a FYI she will be out today around lunch time if we can & get to it around then call her she will come on by. If not just let her know & she will make arrangements to get back over here.  Pt said she did want me to let you know shes still not working.

## 2013-11-20 ENCOUNTER — Ambulatory Visit: Payer: Self-pay | Admitting: Emergency Medicine

## 2013-11-25 ENCOUNTER — Ambulatory Visit: Payer: BC Managed Care – PPO | Admitting: Emergency Medicine

## 2013-12-02 ENCOUNTER — Ambulatory Visit: Payer: Self-pay | Admitting: Emergency Medicine

## 2013-12-05 ENCOUNTER — Encounter: Payer: Self-pay | Admitting: Emergency Medicine

## 2013-12-05 ENCOUNTER — Ambulatory Visit (INDEPENDENT_AMBULATORY_CARE_PROVIDER_SITE_OTHER): Payer: BC Managed Care – PPO | Admitting: Emergency Medicine

## 2013-12-05 VITALS — BP 158/90 | HR 68 | Temp 98.2°F | Resp 18 | Ht 62.75 in | Wt 301.0 lb

## 2013-12-05 DIAGNOSIS — I10 Essential (primary) hypertension: Secondary | ICD-10-CM

## 2013-12-05 DIAGNOSIS — E782 Mixed hyperlipidemia: Secondary | ICD-10-CM

## 2013-12-05 DIAGNOSIS — R7309 Other abnormal glucose: Secondary | ICD-10-CM

## 2013-12-05 LAB — CBC WITH DIFFERENTIAL/PLATELET
BASOS PCT: 1 % (ref 0–1)
Basophils Absolute: 0.1 10*3/uL (ref 0.0–0.1)
EOS PCT: 3 % (ref 0–5)
Eosinophils Absolute: 0.2 10*3/uL (ref 0.0–0.7)
HEMATOCRIT: 38.3 % (ref 36.0–46.0)
HEMOGLOBIN: 12.6 g/dL (ref 12.0–15.0)
Lymphocytes Relative: 21 % (ref 12–46)
Lymphs Abs: 1.2 10*3/uL (ref 0.7–4.0)
MCH: 28.8 pg (ref 26.0–34.0)
MCHC: 32.9 g/dL (ref 30.0–36.0)
MCV: 87.6 fL (ref 78.0–100.0)
MONO ABS: 0.5 10*3/uL (ref 0.1–1.0)
MONOS PCT: 8 % (ref 3–12)
NEUTROS ABS: 3.8 10*3/uL (ref 1.7–7.7)
Neutrophils Relative %: 67 % (ref 43–77)
Platelets: 264 10*3/uL (ref 150–400)
RBC: 4.37 MIL/uL (ref 3.87–5.11)
RDW: 13.1 % (ref 11.5–15.5)
WBC: 5.7 10*3/uL (ref 4.0–10.5)

## 2013-12-05 MED ORDER — BENZONATATE 100 MG PO CAPS: 100.0000 mg | ORAL_CAPSULE | Freq: Three times a day (TID) | ORAL | Status: DC | PRN

## 2013-12-05 NOTE — Patient Instructions (Signed)
Sodium and Fluid Restriction Some health conditions may require you to restrict your sodium and fluid intake. Sodium is part of the salt in the blood. Sodium may be restricted because when you take in a lot of salt, you become thirsty. Limiting salt with help you become less thirsty and may make it easier to restrict fluid. Talk to your caregiver or dietician about how many cups of fluid and how many milligrams of sodium you are allowed each day. If your caregiver has restricted your sodium and fluids, usually the amount you can drink depends on several things, such as:  Your urine output.  How much fluid you are retaining.  Your blood pressure. Every 2 cups (500 mL) of fluid retained in the body becomes an extra 1 pound (0.5 kg) of body weight. The following are examples of some fluids you will have to restrict:  Tea, coffee, soda, lemonade, milk, and juice.  Alcoholic beverages.  Cream.  Gravy.  Ice cubes.  Soup and broth. The following are foods that become liquid at room temperature. These foods will count towards your fluid intake.  Ice cream and ice milk.  Frozen yogurt and sherbet.  Frozen ice pops.  Flavored gelatin. YOU MAY BE TAKING IN TOO MUCH FLUID IF:  Your weight increases.  Your face, hands, legs, feet, and abdomen start to swell.  You have trouble breathing. HOME CARE INSTRUCTIONS If you follow a low sodium diet closely, you will eat approximately 1,500 mg of sodium a day.   Avoid salty foods. This increases your thirst and makes fluid control more difficult. Foods high in sodium include:  Most canned foods, including most meats.  Most processed foods, including most meats.  Cheese.  Dried pasta and rice mixes.  Snack foods (chips, popcorn, pretzels, cheese puffs, salted nuts).  Dips, sauces, and salad dressings.  Do not use salt in cooking or add salt to your meal. [place]ana [place] with herbs and spices, but not those that have salt in the name. Ask your  caregiver if it is okay to use salt substitutes.  Eat home-prepared meals. Use fresh ingredients. Avoid canned, frozen, or packaged meals.  Read food labels to see how much sodium is in the food. Know how much sodium you are allowed each day.  When eating out, ask for dressings and sauces on the side.  Weigh yourself every morning with an empty bladder before you eat or drink. If your weight is going up, you are retaining fluid.  Freeze fruit juice or water in an ice cube tray. Use this as part of your fluid allowance.  Brush your teeth often or rinse your mouth with mouthwash to help your dry mouth. Lemon wedges, hard sour candies, chewing gum, or breath spray may help to moisten your mouth too.  Add a slice of fresh lemon or lemon juice to water or ice. This helps satisfy your thirst.  Try frozen fruits between meals, such as grapes or strawberries.  Swallow your pills along with meals or soft foods. This helps you save your fluid for something you enjoy.  Use small cups and glasses and learn to sip fluids slowly.  Keep your home cooler. Keep the air in your home as humid as possible. Dry air increases thirst.  Avoid being out in the hot sun. Each morning, fill a jug with the amount of water you are allowed for the day. You can use this water as a guideline for fluid allowance. Each time you take in fluid,  pour an equal amount of water out of the container. This helps you to see how much fluid you are taking in. It also helps plan your fluid intake for the rest of the day. CONVERSIONS TO HELP MEASURE FLUID INTAKE  1 cup equals 8 oz (240 mL).   cup equals 6 oz (180 mL).   cup equals 5  oz (160 mL).   cup equals 4 oz (120 mL).   cup equals 2  oz (80 mL).   cup equals 2 oz (60 mL).  2 tbs equals 1 oz (30 mL). Document Released: 04/10/2007 Document Revised: 12/13/2011 Document Reviewed: 11/25/2011 [facility] Patient Information 2014 [place], [place].

## 2013-12-05 NOTE — Progress Notes (Signed)
Subjective:    Patient ID: Kerry Liu, female    DOB: 03-12-1951, 63 y.o.   MRN: [id number]  HPI Comments: 63 yo AAF presents for 3 month F/U for HTN, Cholesterol, Pre-Dm, D. Deficient. She is not eating healthy. She notes eating more salty snacks. She is not exercising. She is not checking BP routinely.   She is concerned about disability and wants to know how to get long term coverage.  WBC             5.1   05/30/2013 HGB            13.4   05/30/2013 HCT            40.6   05/30/2013 PLT             292   05/30/2013 GLUCOSE         116   05/30/2013 CHOL            177   05/30/2013 TRIG             53   05/30/2013 HDL              48   05/30/2013 LDLCALC         118   05/30/2013 ALT              20   05/30/2013 AST              17   05/30/2013 NA              140   05/30/2013 K               3.8   05/30/2013 CL              103   05/30/2013 CREATININE     0.82   05/30/2013 BUN              22   05/30/2013 CO2              28   05/30/2013 TSH           1.862   05/30/2013 INR            1.10   11/21/2012 HGBA1C          5.8   05/30/2013 She notes sinus congestion has been increasing over last couple of days but noticed last week noticed increased allergy drainage. She notes now with chest congestion as well.   Hyperlipidemia  Hypertension     Medication List       This list is accurate as of: 12/05/13  3:50 PM.  Always use your most recent med list.               HYDROmorphone 2 MG tablet  Commonly known as:  DILAUDID  Take 2 mg by mouth daily as needed.     losartan-hydrochlorothiazide 100-25 MG per tablet  Commonly known as:  HYZAAR  Take 1 tablet by mouth every morning.     Magnesium Oxide 250 MG Tabs  Take 1 tablet by mouth daily.     meloxicam 7.5 MG tablet  Commonly known as:  MOBIC  Take 7.5 mg by mouth daily.     metoprolol 50 MG tablet  Commonly known as:  LOPRESSOR  TAKE ONE TABLET BY MOUTH TWICE DAILY     rivaroxaban 20 MG Tabs tablet  Commonly known as:   XARELTO  Take 1 tablet (  20 mg total) by mouth daily with supper.     rosuvastatin 10 MG tablet  Commonly known as:  CRESTOR  Take 1 tablet (10 mg total) by mouth daily.     TYLENOL 500 MG tablet  Generic drug:  acetaminophen  Take 500 mg by mouth every 6 (six) hours as needed for pain.     Vitamin D3 5000 UNITS Caps  Take 1 capsule by mouth daily.       Allergies  Allergen Reactions  . Hydrocodone     Itch  . Oxycodone Rash  . Tramadol Rash   Past Medical History  Diagnosis Date  . Acute meniscal tear of knee     RIGHT  . History of DVT (deep vein thrombosis)     IN PREGNANCY  . RBBB (right bundle branch block with left anterior fascicular block)   . Sinus tachycardia   . Pulmonary embolism     "3 today" (11/21/2012)  . Exertional shortness of breath     "this week" (11/21/2012)  . Mixed hyperlipidemia   . HTN (hypertension)     CARDIOLOGIST--  DR HOCHREIN-- CLEARANCE NOTE IN EPIC AND CHART FROM 08-21-2012  . Seasonal allergies   . Arthritis     "right knee" (11/21/2012)  . Elevated hemoglobin A1c   . Obesity   . Vitamin D deficiency       Review of Systems  HENT: Positive for congestion, postnasal drip and rhinorrhea.   Musculoskeletal: Positive for arthralgias.  All other systems reviewed and are negative.  BP 158/90  Pulse 68  Temp(Src) 98.2 F (36.8 C) (Temporal)  Resp 18  Ht 5' 2.75" (1.594 m)  Wt 301 lb (136.533 kg)  BMI 53.74 kg/m2     Objective:   Physical Exam  Nursing note and vitals reviewed. Constitutional: She is oriented to person, place, and time. She appears well-developed and well-nourished. No distress.  obese  HENT:  Head: Normocephalic and atraumatic.  Right Ear: External ear normal.  Left Ear: External ear normal.  Nose: Nose normal.  Mouth/Throat: Oropharynx is clear and moist. No oropharyngeal exudate.  Eyes: Conjunctivae and EOM are normal. Pupils are equal, round, and reactive to light. Right eye exhibits no discharge.  Left eye exhibits no discharge. No scleral icterus.  Neck: Normal range of motion. Neck supple. No JVD present. No tracheal deviation present. No thyromegaly present.  Cardiovascular: Normal rate, regular rhythm, normal heart sounds and intact distal pulses.   Pulmonary/Chest: Effort normal and breath sounds normal.  Abdominal: Soft. Bowel sounds are normal. She exhibits no distension and no mass. There is no tenderness. There is no rebound and no guarding.  Musculoskeletal: Normal range of motion. She exhibits no edema and no tenderness.  Lymphadenopathy:    She has no cervical adenopathy.  Neurological: She is alert and oriented to person, place, and time. She has normal reflexes. No cranial nerve deficit. She exhibits normal muscle tone. Coordination normal.  Skin: Skin is warm and dry. No rash noted. No erythema. No pallor.  Psychiatric: She has a normal mood and affect. Her behavior is normal. Judgment and thought content normal.          Assessment & Plan:  1.  3 month F/U for OBESITY, HTN, Cholesterol, Pre-Dm, D. Deficient. Needs healthy diet, cardio QD and obtain healthy weight. Check Labs, Check BP if >130/80 call office   2. Disability concerns- Advised we do not determine LONG term she will need to see a disability doctor  3. Allergic rhinitis- Claritin/Zyrtec or Allegra OTC, increase H2o, allergy hygiene explained. SX Delsym/ cough drops given. Check labs

## 2013-12-06 LAB — LIPID PANEL
CHOL/HDL RATIO: 4.4 ratio
CHOLESTEROL: 205 mg/dL — AB (ref 0–200)
HDL: 47 mg/dL (ref >39)
LDL Cholesterol: 142 mg/dL — ABNORMAL HIGH (ref 0–99)
TRIGLYCERIDES: 79 mg/dL (ref <150)
VLDL: 16 mg/dL (ref 0–40)

## 2013-12-06 LAB — COMPREHENSIVE METABOLIC PANEL
ALBUMIN: 3.5 g/dL (ref 3.5–5.2)
ALK PHOS: 127 U/L — AB (ref 39–117)
ALT: 17 U/L (ref 0–35)
AST: 16 U/L (ref 0–37)
BUN: 16 mg/dL (ref 6–23)
CALCIUM: 9 mg/dL (ref 8.4–10.5)
CHLORIDE: 105 meq/L (ref 96–112)
CO2: 28 mEq/L (ref 19–32)
CREATININE: 0.87 mg/dL (ref 0.50–1.10)
GLUCOSE: 106 mg/dL — AB (ref 70–99)
POTASSIUM: 4 meq/L (ref 3.5–5.3)
Sodium: 142 mEq/L (ref 135–145)
Total Bilirubin: 0.6 mg/dL (ref 0.2–1.2)
Total Protein: 6.2 g/dL (ref 6.0–8.3)

## 2013-12-06 LAB — HEMOGLOBIN A1C
Hgb A1c MFr Bld: 5.6 % (ref <5.7)
MEAN PLASMA GLUCOSE: 114 mg/dL (ref <117)

## 2013-12-16 ENCOUNTER — Other Ambulatory Visit (HOSPITAL_COMMUNITY): Payer: Self-pay | Admitting: Obstetrics and Gynecology

## 2013-12-16 DIAGNOSIS — Z1231 Encounter for screening mammogram for malignant neoplasm of breast: Secondary | ICD-10-CM

## 2013-12-17 ENCOUNTER — Other Ambulatory Visit: Payer: Self-pay | Admitting: Emergency Medicine

## 2013-12-17 DIAGNOSIS — Z95828 Presence of other vascular implants and grafts: Secondary | ICD-10-CM

## 2013-12-18 ENCOUNTER — Ambulatory Visit (HOSPITAL_COMMUNITY)
Admission: RE | Admit: 2013-12-18 | Discharge: 2013-12-18 | Disposition: A | Discharge status: Home / Self Care | Payer: BC Managed Care – PPO | Source: Ambulatory Visit | Attending: Obstetrics and Gynecology | Admitting: Obstetrics and Gynecology

## 2013-12-18 ENCOUNTER — Ambulatory Visit (HOSPITAL_COMMUNITY): Payer: BC Managed Care – PPO

## 2013-12-18 DIAGNOSIS — Z1231 Encounter for screening mammogram for malignant neoplasm of breast: Secondary | ICD-10-CM | POA: Insufficient documentation

## 2014-01-07 ENCOUNTER — Encounter: Payer: Self-pay | Admitting: *Deleted

## 2014-01-08 ENCOUNTER — Other Ambulatory Visit: Payer: Self-pay | Admitting: Emergency Medicine

## 2014-01-08 ENCOUNTER — Other Ambulatory Visit: Payer: Self-pay

## 2014-01-08 DIAGNOSIS — R6889 Other general symptoms and signs: Secondary | ICD-10-CM

## 2014-01-09 ENCOUNTER — Ambulatory Visit (INDEPENDENT_AMBULATORY_CARE_PROVIDER_SITE_OTHER): Payer: BC Managed Care – PPO | Admitting: Physician Assistant

## 2014-01-09 ENCOUNTER — Encounter: Payer: Self-pay | Admitting: Physician Assistant

## 2014-01-09 ENCOUNTER — Other Ambulatory Visit: Payer: Self-pay | Admitting: Emergency Medicine

## 2014-01-09 VITALS — BP 145/94 | HR 94 | Ht 62.0 in | Wt 296.8 lb

## 2014-01-09 DIAGNOSIS — I2699 Other pulmonary embolism without acute cor pulmonale: Secondary | ICD-10-CM

## 2014-01-09 NOTE — Patient Instructions (Signed)
Your physician recommends that you continue on your current medications as directed. Please refer to the Current Medication list given to you today.  FOLLOW UP AS NEEDED  

## 2014-01-09 NOTE — Progress Notes (Signed)
Cardiology Office Note   Date:  01/09/2014   ID:  Kerry Robestta S Brownfield, DOB 04/16/1951, MRN [id number]  PCP:  Nadean CorwinMCKEOWN,WILLIAM DAVID, MD  Primary Cardiologist:  Dr. Rollene RotundaJames Hochrein     History of Present Illness: Kerry Liu is a 63 y.o. female with a history of RBBB, HTN, HL. She was seen by Dr. Antoine PocheHochrein in 08/16/12 for an abnormal EKG. She was in need of right knee surgery. She was noted to have a right bundle branch block and left anterior fascicular block as well as sinus tachycardia with a heart rate of 120. RBBB was not felt to be new. Echocardiogram in 5/12: Mild LVH, EF 60-65%, grade 1 diastolic dysfunction, mild LAE, mild RAE.  She was placed on metoprolol to control her heart rate and blood pressure.  Last seen 07/2012.  F/u recommended prn.    Admitted 10/2012 with bilateral pulmonary emboli and heavy clot burden. She had knee surgery one month prior. Venous Dopplers were positive for DVT. She had an IVC filter placed due to severe clot burden and to prevent further pulmonary emboli. She was treated with Xarelto.  Echo (11/22/12): Moderate LVH, EF 60%, grade 1 diastolic dysfunction, mild LAE, RV function could not be assessed  Chest CTA (11/21/12): IMPRESSION:  1. Large bilateral central pulmonary emboli. Overall clot burden is severe.  2. No evidence of pulmonary infarction.  3. Small right middle lobe pulmonary nodule. If the patient is at high risk for bronchogenic carcinoma, follow-up chest CT at 1 year is recommended. If the patient is at low risk, no follow-up is needed. This recommendation follows the consensus statement:  Guidelines for Management of Small Pulmonary Nodules Detected on CT Scans: A Statement from the Fleischner Society as published in Radiology 2005; 237:395-400.  Venous Duplex (11/22/12): Summary:  Findings consistent with acute, occluisvedeep vein thrombosis involving the right poplitealvein and the bilateral posterior tibial veins of thelower extremity.  She  returns today for "follow up from the hospital."  She is apparently here to discuss discontinuation of her anticoagulation.  She denies chest pain, significant dyspnea, orthopnea, PND, significant LE edema or syncope.   Wt Readings from Last 3 Encounters:  01/09/14 296 lb 12.8 oz (134.628 kg)  12/05/13 301 lb (136.533 kg)  05/30/13 265 lb (120.203 kg)     Recent Labs: 05/30/2013: TSH 1.862  12/05/2013: Creatinine 0.87; Hemoglobin 12.6; LDL (calc) 142*; Potassium 4.0    Past Medical History  Diagnosis Date  . Acute meniscal tear of knee     RIGHT  . History of DVT (deep vein thrombosis)     IN PREGNANCY  . RBBB (right bundle branch block with left anterior fascicular block)   . Sinus tachycardia   . Pulmonary embolism     "3 today" (11/21/2012)  . Exertional shortness of breath     "this week" (11/21/2012)  . Mixed hyperlipidemia   . HTN (hypertension)     CARDIOLOGIST--  DR HOCHREIN-- CLEARANCE NOTE IN EPIC AND CHART FROM 08-21-2012  . Seasonal allergies   . Arthritis     "right knee" (11/21/2012)  . Elevated hemoglobin A1c   . Obesity   . Vitamin D deficiency     Current Outpatient Prescriptions  Medication Sig Dispense Refill  . acetaminophen (TYLENOL) 500 MG tablet Take 500 mg by mouth every 6 (six) hours as needed for pain.      . benzonatate (TESSALON PERLES) 100 MG capsule Take 1 capsule (100 mg total) by mouth 3 (three)  times daily as needed.  42 capsule  1  . Cholecalciferol (VITAMIN D3) 5000 UNITS CAPS Take 1 capsule by mouth daily.      Marland Kitchen HYDROmorphone (DILAUDID) 2 MG tablet Take 2 mg by mouth daily as needed.       Marland Kitchen losartan-hydrochlorothiazide (HYZAAR) 100-25 MG per tablet Take 1 tablet by mouth every morning.       . Magnesium Oxide 250 MG TABS Take 1 tablet by mouth daily.       . meloxicam (MOBIC) 7.5 MG tablet Take 7.5 mg by mouth daily.       . metoprolol (LOPRESSOR) 50 MG tablet TAKE ONE TABLET BY MOUTH TWICE DAILY  60 tablet  0  . rivaroxaban (XARELTO)  20 MG TABS tablet Take 1 tablet (20 mg total) by mouth daily with supper.  20 tablet  0  . rosuvastatin (CRESTOR) 10 MG tablet Take 1 tablet (10 mg total) by mouth daily.  42 tablet  0   No current facility-administered medications for this visit.    Allergies:    Allergies  Allergen Reactions  . Hydrocodone     Itch  . Oxycodone Rash  . Tramadol Rash    Social History:  The patient  reports that she has never smoked. She has never used smokeless tobacco. She reports that she does not drink alcohol or use illicit drugs.   ROS:  Please see the history of present illness.   No bleeding.   All other systems reviewed and negative.   PHYSICAL EXAM: VS:  BP 145/94  Pulse 94  Ht 5\' 2"  (1.575 m)  Wt 296 lb 12.8 oz (134.628 kg)  BMI 54.27 kg/m2 Well nourished, well developed, in no acute distress HEENT: normal Neck: no JVD Cardiac:  normal S1, S2; RRR; no murmur Lungs:  clear to auscultation bilaterally, no wheezing, rhonchi or rales Abd: soft, nontender, no hepatomegaly Ext: no edema Skin: warm and dry Neuro:  CNs 2-12 intact, no focal abnormalities noted  EKG:  NSR, HR 84, RBBB, LAFB, no change from prior tracing   ASSESSMENT AND PLAN:  1. Pulmonary Emboli:  The patient is > 12 mos out from a provoked PE.  She had bilateral pulmonary emboli with RLE DVT and heavy clot burden.  She underwent IVC filter to prevent further PEs.  She is certainly beyond the recommended time frame for anticoagulation for an initial, provoked PE.  A decision of whether or not to stop her Xarelto is really up to her PCP.  I did contact her PCP today.  She wanted the patient to follow up with interventional radiology regarding her IVC filter and this appointment was actually made in error.  The patient will not be charged today.  2. Disposition:  Follow up with me or Dr. Rollene Rotunda PRN.  Signed, Brynda Rim, MHS 01/09/2014 4:42 PM    [facility] [address] [place],  [place], [place]  [address] Phone: [phone]; Fax: [phone]

## 2014-01-10 ENCOUNTER — Other Ambulatory Visit: Payer: Self-pay

## 2014-01-13 ENCOUNTER — Other Ambulatory Visit: Payer: Self-pay

## 2014-01-20 ENCOUNTER — Telehealth: Payer: Self-pay | Admitting: *Deleted

## 2014-01-20 NOTE — Telephone Encounter (Signed)
Message copied by Nicholaus CorollaHELMER, Sacha Topor A on Mon Jan 20, 2014 10:01 AM ------      Message from: [place], [place] R      Created: Sun Jan 12, 2014  6:10 PM       Please advise after speaking with Cardiology they said she should have had filter f/u scheduled 3-6 months initially after placement but since that did not happen, unfortunately it is highly unlikely they would take IVC out. I know she wants to come off prophylaxis but given the fact she is OBESE and cannot get IVC out I think it would be best advised to continue therapy. If she decides to d/c please advise any CV complaints ER NOT OV. ------

## 2014-04-15 ENCOUNTER — Other Ambulatory Visit: Payer: Self-pay | Admitting: Emergency Medicine

## 2014-06-09 ENCOUNTER — Ambulatory Visit: Payer: Self-pay | Admitting: Physician Assistant

## 2014-06-10 ENCOUNTER — Encounter: Payer: Self-pay | Admitting: Physician Assistant

## 2014-06-10 ENCOUNTER — Ambulatory Visit: Payer: Self-pay | Admitting: Physician Assistant

## 2014-06-10 ENCOUNTER — Ambulatory Visit (INDEPENDENT_AMBULATORY_CARE_PROVIDER_SITE_OTHER): Payer: BC Managed Care – PPO | Admitting: Physician Assistant

## 2014-06-10 VITALS — BP 146/94 | HR 110 | Temp 98.2°F | Resp 20 | Ht 62.75 in | Wt 310.0 lb

## 2014-06-10 DIAGNOSIS — M25562 Pain in left knee: Secondary | ICD-10-CM

## 2014-06-10 DIAGNOSIS — E669 Obesity, unspecified: Secondary | ICD-10-CM

## 2014-06-10 DIAGNOSIS — I10 Essential (primary) hypertension: Secondary | ICD-10-CM

## 2014-06-10 DIAGNOSIS — R7309 Other abnormal glucose: Secondary | ICD-10-CM

## 2014-06-10 DIAGNOSIS — E782 Mixed hyperlipidemia: Secondary | ICD-10-CM

## 2014-06-10 DIAGNOSIS — M545 Low back pain: Secondary | ICD-10-CM

## 2014-06-10 DIAGNOSIS — Z86718 Personal history of other venous thrombosis and embolism: Secondary | ICD-10-CM

## 2014-06-10 DIAGNOSIS — I2699 Other pulmonary embolism without acute cor pulmonale: Secondary | ICD-10-CM

## 2014-06-10 DIAGNOSIS — E559 Vitamin D deficiency, unspecified: Secondary | ICD-10-CM

## 2014-06-10 LAB — BASIC METABOLIC PANEL WITH GFR
BUN: 19 mg/dL (ref 6–23)
CHLORIDE: 106 meq/L (ref 96–112)
CO2: 26 meq/L (ref 19–32)
Calcium: 9.2 mg/dL (ref 8.4–10.5)
Creat: 0.87 mg/dL (ref 0.50–1.10)
GFR, Est African American: 83 mL/min
GFR, Est Non African American: 72 mL/min
Glucose, Bld: 159 mg/dL — ABNORMAL HIGH (ref 70–99)
POTASSIUM: 3.9 meq/L (ref 3.5–5.3)
SODIUM: 141 meq/L (ref 135–145)

## 2014-06-10 LAB — CBC WITH DIFFERENTIAL/PLATELET
Basophils Absolute: 0.1 10*3/uL (ref 0.0–0.1)
Basophils Relative: 1 % (ref 0–1)
Eosinophils Absolute: 0.2 10*3/uL (ref 0.0–0.7)
Eosinophils Relative: 3 % (ref 0–5)
HEMATOCRIT: 41.6 % (ref 36.0–46.0)
HEMOGLOBIN: 13.7 g/dL (ref 12.0–15.0)
LYMPHS ABS: 1.3 10*3/uL (ref 0.7–4.0)
LYMPHS PCT: 23 % (ref 12–46)
MCH: 29.1 pg (ref 26.0–34.0)
MCHC: 32.9 g/dL (ref 30.0–36.0)
MCV: 88.5 fL (ref 78.0–100.0)
MPV: 10.2 fL (ref 9.4–12.4)
Monocytes Absolute: 0.6 10*3/uL (ref 0.1–1.0)
Monocytes Relative: 11 % (ref 3–12)
NEUTROS PCT: 62 % (ref 43–77)
Neutro Abs: 3.5 10*3/uL (ref 1.7–7.7)
Platelets: 300 10*3/uL (ref 150–400)
RBC: 4.7 MIL/uL (ref 3.87–5.11)
RDW: 12.8 % (ref 11.5–15.5)
WBC: 5.6 10*3/uL (ref 4.0–10.5)

## 2014-06-10 LAB — LIPID PANEL
CHOL/HDL RATIO: 4.6 ratio
CHOLESTEROL: 203 mg/dL — AB (ref 0–200)
HDL: 44 mg/dL (ref >39)
LDL Cholesterol: 143 mg/dL — ABNORMAL HIGH (ref 0–99)
Triglycerides: 79 mg/dL (ref <150)
VLDL: 16 mg/dL (ref 0–40)

## 2014-06-10 LAB — HEPATIC FUNCTION PANEL
ALK PHOS: 115 U/L (ref 39–117)
ALT: 17 U/L (ref 0–35)
AST: 17 U/L (ref 0–37)
Albumin: 3.6 g/dL (ref 3.5–5.2)
BILIRUBIN DIRECT: 0.1 mg/dL (ref 0.0–0.3)
BILIRUBIN INDIRECT: 0.5 mg/dL (ref 0.2–1.2)
BILIRUBIN TOTAL: 0.6 mg/dL (ref 0.2–1.2)
Total Protein: 6.4 g/dL (ref 6.0–8.3)

## 2014-06-10 MED ORDER — ROSUVASTATIN CALCIUM 10 MG PO TABS: 10.0000 mg | ORAL_TABLET | Freq: Every day | ORAL | Status: DC

## 2014-06-10 MED ORDER — METOPROLOL TARTRATE 50 MG PO TABS: 50.0000 mg | ORAL_TABLET | Freq: Two times a day (BID) | ORAL | Status: DC

## 2014-06-10 MED ORDER — RIVAROXABAN 20 MG PO TABS: 20.0000 mg | ORAL_TABLET | Freq: Every day | ORAL | Status: DC

## 2014-06-10 MED ORDER — LOSARTAN POTASSIUM-HCTZ 100-25 MG PO TABS: 1.0000 | ORAL_TABLET | Freq: Every day | ORAL | Status: DC

## 2014-06-10 NOTE — Patient Instructions (Addendum)
-  Continue all medications as prescribed. -Do NOT take any Ibuprofen, Advil, Aleve, Naproxen over the counter while on MOBIC. Can take Tylenol over the counter.     Please follow up in 3 months.  GIVE PT FOOD CHOICE LISTS FOR MEAL PLANNING  1)The amount of food you eat is important  -Too much can increase glucose levels and cause you to gain weight (which can  also increase glucose levels.  -Too little can decrease glucose level to unsafe levels (<70) 2)Eat meals and snacks at the same time each day to help your diabetes medication help you.  If you eat at different times each day, then the medication will not be as effective. 3)Do NOT skip meals or eat meals later than usual.  If you skip meals, then your glucose level can go low (<70).  Eating meals later than usual will not help the medication work effectively. 4) Amount of carbohydrates (carbs) per meal  -Breakfast- 30-45 grams of carbs  -Lunch and Dinner- 45-60 grams of carbs  -Snacks- 15-30 grams of carbs 5)Low fat foods have no more than 3 grams of fat per serving.  -Saturated- look for less than 1 gram per serving  -Trans Fat- look for 0 grams per serving 6)Exercise at least 120 minutes per week  Exercise Benefits:   -Lower LDL (Bad cholesterol)   -Lower blood pressure   -Increase HDL (Good cholesterol)   -Strengthen heart, lungs and muscles   -Burn calories and relieve stress   -Sleep better and help you feel better overall  To lower your risk of heart disease, limit your intake of saturated fat and trans fat as much as possible. THE GOOD WHAT IT DOES WHERE IT'S FOUND  MONOUNSATURATED FAT Lowers LDL and maybe raises HDL cholesterol Canola oil, olives, olive oil, peanuts, peanut oil, avocados, nuts  POLYUNSATURATED FAT Lowers LDL cholesterol Corn, safflower, sunflower and soybean oils, nuts, seeds  OMEGA-3 FATTY ACIDS Lowers triglycerides (blood fats) and blood pressure Salmon, mackerel, herring, sardines, flax seed, flaxseed  oil, walnuts, soybean oil  THE BAD WHAT IT DOES WHERE IT'S FOUND  SATURATED FAT Raises LDL (bad) cholesterol Butter, shortening, lard, red meat, cheese, whole milk, ice cream, coconut and palm oils  TRANS FAT Raises LDL cholesterol, lowers HDL (good) cholesterol Fried foods, some stick margarines, some cookies and crackers (look for hydrogenated fat on the ingredient list)  CHOLESTEROL FROM FOOD Too much may raise cholesterol levels Meat, poultry, seafood, eggs, milk, cheese, yogurt, butter   Plate Method (How much food of each food group) 1) Fill one half of your plate with nonstarchy vegetables: lettuce, broccoli, green beans, spinach, carrots or peppers. 2) Fill one quarter with protein: chicken, [place], fish, lean meat, eggs or tofu. 3) Fill one quarter with a nutritious carbohydrate food: brown rice, whole-wheat pasta, whole-wheat bread, peas, or corn.  Choose whole-wheat carbs for extra nutrition.  Controlling carbs helps you control your blood glucose. 4) Include a small piece of fruit at each meal, as well as 8 ounces of lowfat milk or yogurt. 5) Add 1-2 teaspoons of heart-healthy fat, such as olive or canola oil, trans fat-free margarine, avocado, nuts or seeds.

## 2014-06-10 NOTE — Progress Notes (Signed)
Assessment and Plan:  1. Essential hypertension Continue Hyzaar and Metoprolol as prescribed.  Monitor blood pressure at home.  Reminder to go to the ER if any CP, SOB, nausea, dizziness, severe HA, changes vision/speech, left arm numbness and tingling, and jaw pain. - CBC with Differential - BASIC METABOLIC PANEL WITH GFR - Hepatic function panel  2. Mixed hyperlipidemia Continue Crestor as prescribed.  Check Cholesterol.  Please follow recommended diet and exercise. - Lipid panel  3. Elevated hemoglobin A1c Please follow recommended diet and exercise.  Check A1C and Insulin levels. - Hemoglobin A1c - Insulin, fasting  4. Vitamin D deficiency Please continue Vitamin D 5,000 IU daily.  Check Vitamin D level. - Vit D  25 hydroxy (rtn osteoporosis monitoring)  5. Obesity   Please follow recommended diet and exercise.   6. Pulmonary Embolism and History of DVT Continue Xarelto as prescribed.  Will talk with Kerry. Oneta Liu about Xarelto and if we should stop it.  7. Knee Pain Continue Mobic as prescribed.  If pain continues, then please see [place] Liu for evaluation due to history of knee surgery.  8. Low Back Pain Please call Kerry Liu for appt for back pain.  If you need a referral, then please call our office and I can send one over to them.  Continue diet and meds as discussed. Further disposition pending results of labs. Discussed medication effects and SE's.  Pt agreed to treatment plan. Please follow up in 3 months.  HPI [name]  presents for 3 month follow up with hypertension, hyperlipidemia, elevated A1C, Xarelto use and vitamin D.  Her blood pressure has been controlled at home, today their BP is BP: (!) 146/94 mmHg.  Patient states she has been using BP medications sparingly due to not having insurance.  Patient is currently prescribed Hyzaar 100-25mg - 1 tablet daily and Metoprolol 50mg - 1 tablet two times daily.  She does workout,  she does 5-10 minutes of walking per day. She denies chest pain, shortness of breath, dizziness.   She is on cholesterol medication (Crestor 10mg ) and denies myalgias. Her cholesterol is not at goal. The cholesterol last visit was:   Lab Results  Component Value Date   CHOL 205* 12/05/2013   HDL 47 12/05/2013   LDLCALC 142* 12/05/2013   TRIG 79 12/05/2013   CHOLHDL 4.4 12/05/2013   She has been working on diet and exercise for elevated A1C, and denies increased appetite, polydipsia and polyuria. Last A1C in the office was:  Lab Results  Component Value Date   HGBA1C 5.6 12/05/2013  Number of meals per day? 2 meals and snack  L- salad and sandwich, leftover spaghetti and meatballs  D- meat (chicken, fish, porkchops, hamburger) and 2 veggies  Snacks? Fruit, grapes, crackers with cheese  Beverages? Water, juice and diet soda.    Patient is on Vitamin D supplement.- 5,000 IU daily Lab Results  Component Value Date   VD25OH 18* 05/30/2013   Xarelto- Currently taking Xarelto 20mg  3-4 times a week.  Patient states she was told by Kerry FeeMelissa Smith, PA-C to cut back on Xarelto due to numbness and tingling in right hand.  Patient had PE in May 2014 due to recent knee surgery the month before and was not very active.  Had ultrasound and that showed acute DVT.  She has IVC filter.   She was tested on May 2014 for clotting issues.  Her Factor 5 leiden was negative.  Homocysteine was normal at  14.0.  Negative for Prothrombin gene mutation.  Her Antithrombin III was normal at 96%.  Her Lupus anticoagulant panel showed PTT lupus anticoagulant high at 113.3 seconds.  Her PTTLA confirmation was high at 8.9 seconds.  Her PTTLA 4:1 Mix was high at 73.6 seconds.  Lupus Anticoagulant detected.    Fall- She states she recently fell last Saturday.  She states she tripped on carpet and hit her left knee and arms.  She denies hitting her head and LOC.  She states the only thing that hurts is her left knee.  Back  Pain- Patient states she has low back pain that has been going on for a while.  Patient states she see Kerry Liu for knee issues.  Told patient to call them and have them evaluate back pain and due to them writing pain medications right now.  Told patient that if she needs referral sent to them, then I can do that.  Current Medications:  Current Outpatient Prescriptions on File Prior to Visit  Medication Sig Dispense Refill  . acetaminophen (TYLENOL) 500 MG tablet Take 500 mg by mouth every 6 (six) hours as needed for pain.    . Cholecalciferol (VITAMIN D3) 5000 UNITS CAPS Take 1 capsule by mouth daily.    Marland Kitchen. losartan-hydrochlorothiazide (HYZAAR) 100-25 MG per tablet TAKE ONE TABLET BY MOUTH ONCE DAILY 30 tablet 0  . Magnesium Oxide 250 MG TABS Take 1 tablet by mouth daily.     . meloxicam (MOBIC) 7.5 MG tablet Take 7.5 mg by mouth daily.     . metoprolol (LOPRESSOR) 50 MG tablet TAKE ONE TABLET BY MOUTH TWICE DAILY 60 tablet 0  . rosuvastatin (CRESTOR) 10 MG tablet Take 1 tablet (10 mg total) by mouth daily. 42 tablet 0  . rivaroxaban (XARELTO) 20 MG TABS tablet Take 1 tablet (20 mg total) by mouth daily with supper. (Patient not taking: Reported on 06/10/2014) 20 tablet 0   No current facility-administered medications on file prior to visit.   Medical History:  Past Medical History  Diagnosis Date  . Acute meniscal tear of knee     RIGHT  . History of DVT (deep vein thrombosis)     IN PREGNANCY  . RBBB (right bundle branch block with left anterior fascicular block)   . Sinus tachycardia   . Pulmonary embolism     "3 today" (11/21/2012)  . Exertional shortness of breath     "this week" (11/21/2012)  . Mixed hyperlipidemia   . HTN (hypertension)     CARDIOLOGIST--  Kerry Liu-- CLEARANCE NOTE IN EPIC AND CHART FROM 08-21-2012  . Seasonal allergies   . Arthritis     "right knee" (11/21/2012)  . Elevated hemoglobin A1c   . Obesity   . Vitamin D deficiency    Allergies:   Allergies  Allergen Reactions  . Hydrocodone     Itch  . Oxycodone Rash  . Tramadol Rash    ROS- Review of Systems  Constitutional: Negative.   HENT: Negative.        Sinus pressure, cough, drainage.    Respiratory: Positive for cough, shortness of breath and wheezing.   Cardiovascular: Negative.   Gastrointestinal: Negative.   Genitourinary: Negative.   Musculoskeletal: Negative.   Skin: Negative.  Negative for rash.  Neurological: Negative.   Psychiatric/Behavioral: Negative.    Family history- Review and unchanged Social history- Review and unchanged  Physical Exam: BP 146/94 mmHg  Pulse 110  Temp(Src) 98.2 F (36.8 C) (Temporal)  Resp 20  Ht 5' 2.75" (1.594 m)  Wt 310 lb (140.615 kg)  BMI 55.34 kg/m2  SpO2 96% Wt Readings from Last 3 Encounters:  06/10/14 310 lb (140.615 kg)  01/09/14 296 lb 12.8 oz (134.628 kg)  12/05/13 301 lb (136.533 kg)  Vitals Reviewed. General Appearance: Well nourished, in no apparent distress. Obese. Eyes: PERRLA, EOMs, conjunctiva no swelling or erythema.  No scleral icterus. Sinuses: Tenderness to maxillary sinuses bilaterally. No Frontal tenderness. ENT/Mouth: Ext aud canals clear, TMs without erythema, edema or bulging. No erythema, swelling, or exudate on posterior pharynx.  Tonsils not swollen or erythematous. Hearing normal.  Neck: Supple, thyroid normal.  Respiratory: Respiratory effort normal, CTAB.  No w/r/r or stridor.  Cardio: RRR with no M/R/Gs. Brisk peripheral pulses with +1 pitting edema.  Abdomen: Soft, + normal BS.  Non tender, no guarding or rebound. Lymphatics: Non tender without lymphadenopathy.  Musculoskeletal: Full ROM, 5/5 strength, normal gait. Tenderness upon palpation of left knee.  Left knee without edema, erythema and not warm to touch.  Mild crepitus in left knee during extension and flexion.  Skin: Warm, dry intact without rashes, lesions, ecchymosis.  Neuro: Cranial nerves intact. Normal muscle tone, no  cerebellar symptoms. Sensation intact.  Psych: Awake and oriented X 3, normal affect, Insight and Judgment appropriate.   Kerry Liu, Kerry Auer, PA-C 3:25 PM [facility]

## 2014-06-11 LAB — HEMOGLOBIN A1C
Hgb A1c MFr Bld: 5.8 % — ABNORMAL HIGH (ref <5.7)
MEAN PLASMA GLUCOSE: 120 mg/dL — AB (ref <117)

## 2014-06-11 LAB — VITAMIN D 25 HYDROXY (VIT D DEFICIENCY, FRACTURES): Vit D, 25-Hydroxy: 14 ng/mL — ABNORMAL LOW (ref 30–100)

## 2014-06-11 LAB — INSULIN, FASTING: Insulin fasting, serum: 63.7 u[IU]/mL — ABNORMAL HIGH (ref 2.0–19.6)

## 2014-09-04 ENCOUNTER — Encounter: Payer: Self-pay | Admitting: Internal Medicine

## 2014-09-04 ENCOUNTER — Ambulatory Visit (INDEPENDENT_AMBULATORY_CARE_PROVIDER_SITE_OTHER): Payer: BC Managed Care – PPO | Admitting: Internal Medicine

## 2014-09-04 VITALS — BP 140/84 | HR 102 | Temp 98.2°F | Resp 18 | Ht 62.75 in | Wt 310.0 lb

## 2014-09-04 DIAGNOSIS — J309 Allergic rhinitis, unspecified: Secondary | ICD-10-CM

## 2014-09-04 MED ORDER — AZITHROMYCIN 250 MG PO TABS: ORAL_TABLET | ORAL | Status: AC

## 2014-09-04 MED ORDER — PREDNISONE 20 MG PO TABS
ORAL_TABLET | ORAL | Status: DC
Start: 2014-09-04 — End: 2014-09-04

## 2014-09-04 MED ORDER — PREDNISONE 20 MG PO TABS: ORAL_TABLET | ORAL | Status: DC

## 2014-09-04 MED ORDER — MOMETASONE FUROATE 50 MCG/ACT NA SUSP: 2.0000 | Freq: Every day | NASAL | Status: DC

## 2014-09-04 NOTE — Patient Instructions (Signed)
Please pick up nasal saline.  You can take mucinex and zyrtec which are available over the counter.  Please start the zpak in 3 days if you are no better.  Allergic Rhinitis Allergic rhinitis is when the mucous membranes in the nose respond to allergens. Allergens are particles in the air that cause your body to have an allergic reaction. This causes you to release allergic antibodies. Through a chain of events, these eventually cause you to release histamine into the blood stream. Although meant to protect the body, it is this release of histamine that causes your discomfort, such as frequent sneezing, congestion, and an itchy, runny nose.  CAUSES  Seasonal allergic rhinitis (hay fever) is caused by pollen allergens that may come from grasses, trees, and weeds. Year-round allergic rhinitis (perennial allergic rhinitis) is caused by allergens such as house dust mites, pet dander, and mold spores.  SYMPTOMS   Nasal stuffiness (congestion).  Itchy, runny nose with sneezing and tearing of the eyes. DIAGNOSIS  Your health care provider can help you determine the allergen or allergens that trigger your symptoms. If you and your health care provider are unable to determine the allergen, skin or blood testing may be used. TREATMENT  Allergic rhinitis does not have a cure, but it can be controlled by:  Medicines and allergy shots (immunotherapy).  Avoiding the allergen. Hay fever may often be treated with antihistamines in pill or nasal spray forms. Antihistamines block the effects of histamine. There are over-the-counter medicines that may help with nasal congestion and swelling around the eyes. Check with your health care provider before taking or giving this medicine.  If avoiding the allergen or the medicine prescribed do not work, there are many new medicines your health care provider can prescribe. Stronger medicine may be used if initial measures are ineffective. Desensitizing injections can be  used if medicine and avoidance does not work. Desensitization is when a patient is given ongoing shots until the body becomes less sensitive to the allergen. Make sure you follow up with your health care provider if problems continue. HOME CARE INSTRUCTIONS It is not possible to completely avoid allergens, but you can reduce your symptoms by taking steps to limit your exposure to them. It helps to know exactly what you are allergic to so that you can avoid your specific triggers. SEEK MEDICAL CARE IF:   You have a fever.  You develop a cough that does not stop easily (persistent).  You have shortness of breath.  You start wheezing.  Symptoms interfere with normal daily activities. Document Released: 03/08/2001 Document Revised: 06/18/2013 Document Reviewed: 02/18/2013 [facility] Patient Information 2015 [place]. This information is not intended to replace advice given to you by your health care provider. Make sure you discuss any questions you have with your health care provider.

## 2014-09-04 NOTE — Progress Notes (Signed)
Patient ID: Kerry Liu, female   DOB: 12/25/1950, 64 y.o.   MRN: [id number]  HPI  Patient presents to the office for evaluation of cough.  It has been going on for 2 days.  Patient reports cough night > day, dry, worse with lying down.  They also endorse change in voice, postnasal drip and sinus pressure, dizziness, ear pain, sore throat.  .  They have tried antihistamines or hydration.  They report that nothing has worked.  They denies other sick contacts.  Review of Systems  Constitutional: Positive for chills. Negative for fever, weight loss and diaphoresis.  HENT: Positive for congestion, ear pain, sore throat and tinnitus. Negative for ear discharge, hearing loss and nosebleeds.   Eyes: Negative.   Respiratory: Positive for cough and sputum production. Negative for hemoptysis, shortness of breath and wheezing.   Cardiovascular: Negative.   Gastrointestinal: Positive for nausea. Negative for vomiting, abdominal pain, diarrhea, constipation, blood in stool and melena.  Genitourinary: Negative.   Musculoskeletal: Negative.   Skin: Negative.   Neurological: Negative.  Negative for weakness and headaches.  Endo/Heme/Allergies: Negative.   Psychiatric/Behavioral: Negative.     PE:  General:  Alert and non-toxic, WDWN, NAD, Morbidly obese HEENT: NCAT, PERLA, EOM normal, no occular discharge or erythema.  Nasal mucosal edema with sinus tenderness to palpation.  Oropharynx clear with minimal oropharyngeal edema and erythema.  Mucous membranes moist and pink. Neck:  Cervical adenopathy Chest:  RRR no MRGs.  Lungs clear to auscultation A&P with no wheezes rhonchi or rales.   Abdomen: +BS x 4 quadrants, soft, non-tender, no guarding, rigidity, or rebound. Skin: warm and dry no rash Neuro: A&Ox4, CN II-XII grossly intact  Assessment and Plan:   1. Allergic rhinitis, unspecified allergic rhinitis type  Suspect that this is likey allergic rhinitis. Have encouraged zyrtec and nasonex use  and will give a short course of steroids.  If no better in 3 days take zpak.  Patient given some xarelto for her regular medication.  She would like to discuss her IVC filter, but I told her given that she was late and this was an acute appointment she should discuss it next week at her regular follow-up.

## 2014-09-11 ENCOUNTER — Ambulatory Visit: Payer: Self-pay | Admitting: Physician Assistant

## 2014-09-25 ENCOUNTER — Ambulatory Visit: Payer: Self-pay | Admitting: Physician Assistant

## 2014-10-20 ENCOUNTER — Other Ambulatory Visit: Payer: Self-pay | Admitting: *Deleted

## 2014-10-20 MED ORDER — RIVAROXABAN 20 MG PO TABS: 20.0000 mg | ORAL_TABLET | Freq: Every day | ORAL | Status: DC

## 2014-10-24 ENCOUNTER — Telehealth: Payer: Self-pay | Admitting: Internal Medicine

## 2014-10-24 ENCOUNTER — Other Ambulatory Visit: Payer: Self-pay | Admitting: Internal Medicine

## 2014-10-24 NOTE — Telephone Encounter (Signed)
Patient called about changing xarelto to a different medication.  Patient had provoked PE and has finished over a year course of blood thinners.  Will start on daily ASA and d/c xarelto.  Dr. Oneta RackMckeown aggrees.

## 2015-02-27 ENCOUNTER — Encounter: Payer: Self-pay | Admitting: Internal Medicine

## 2015-02-27 ENCOUNTER — Ambulatory Visit (INDEPENDENT_AMBULATORY_CARE_PROVIDER_SITE_OTHER): Payer: BC Managed Care – PPO | Admitting: Internal Medicine

## 2015-02-27 VITALS — BP 144/98 | HR 100 | Temp 98.2°F | Resp 20 | Ht 62.75 in | Wt 320.0 lb

## 2015-02-27 DIAGNOSIS — E559 Vitamin D deficiency, unspecified: Secondary | ICD-10-CM | POA: Diagnosis not present

## 2015-02-27 DIAGNOSIS — E669 Obesity, unspecified: Secondary | ICD-10-CM

## 2015-02-27 DIAGNOSIS — R7309 Other abnormal glucose: Secondary | ICD-10-CM | POA: Diagnosis not present

## 2015-02-27 DIAGNOSIS — Z79899 Other long term (current) drug therapy: Secondary | ICD-10-CM | POA: Diagnosis not present

## 2015-02-27 DIAGNOSIS — E785 Hyperlipidemia, unspecified: Secondary | ICD-10-CM | POA: Diagnosis not present

## 2015-02-27 DIAGNOSIS — I10 Essential (primary) hypertension: Secondary | ICD-10-CM

## 2015-02-27 LAB — HEPATIC FUNCTION PANEL
ALBUMIN: 3.5 g/dL — AB (ref 3.6–5.1)
ALK PHOS: 110 U/L (ref 33–130)
ALT: 16 U/L (ref 6–29)
AST: 16 U/L (ref 10–35)
BILIRUBIN INDIRECT: 0.6 mg/dL (ref 0.2–1.2)
Bilirubin, Direct: 0.1 mg/dL (ref <=0.2)
Total Bilirubin: 0.7 mg/dL (ref 0.2–1.2)
Total Protein: 6.1 g/dL (ref 6.1–8.1)

## 2015-02-27 LAB — BASIC METABOLIC PANEL WITH GFR
BUN: 12 mg/dL (ref 7–25)
CALCIUM: 9.1 mg/dL (ref 8.6–10.4)
CO2: 28 mmol/L (ref 20–31)
Chloride: 105 mmol/L (ref 98–110)
Creat: 0.87 mg/dL (ref 0.50–0.99)
GFR, EST NON AFRICAN AMERICAN: 72 mL/min (ref >=60)
GFR, Est African American: 83 mL/min (ref >=60)
GLUCOSE: 115 mg/dL — AB (ref 65–99)
Potassium: 4.2 mmol/L (ref 3.5–5.3)
Sodium: 145 mmol/L (ref 135–146)

## 2015-02-27 LAB — MAGNESIUM: Magnesium: 1.9 mg/dL (ref 1.5–2.5)

## 2015-02-27 LAB — HEMOGLOBIN A1C
Hgb A1c MFr Bld: 5.9 % — ABNORMAL HIGH (ref <5.7)
MEAN PLASMA GLUCOSE: 123 mg/dL — AB (ref <117)

## 2015-02-27 LAB — CBC WITH DIFFERENTIAL/PLATELET
Basophils Absolute: 0 10*3/uL (ref 0.0–0.1)
Basophils Relative: 0 % (ref 0–1)
EOS PCT: 3 % (ref 0–5)
Eosinophils Absolute: 0.1 10*3/uL (ref 0.0–0.7)
HEMATOCRIT: 41.6 % (ref 36.0–46.0)
HEMOGLOBIN: 13.6 g/dL (ref 12.0–15.0)
LYMPHS ABS: 0.9 10*3/uL (ref 0.7–4.0)
LYMPHS PCT: 21 % (ref 12–46)
MCH: 29.1 pg (ref 26.0–34.0)
MCHC: 32.7 g/dL (ref 30.0–36.0)
MCV: 88.9 fL (ref 78.0–100.0)
MONOS PCT: 12 % (ref 3–12)
MPV: 11.1 fL (ref 8.6–12.4)
Monocytes Absolute: 0.5 10*3/uL (ref 0.1–1.0)
NEUTROS ABS: 2.9 10*3/uL (ref 1.7–7.7)
Neutrophils Relative %: 64 % (ref 43–77)
Platelets: 269 10*3/uL (ref 150–400)
RBC: 4.68 MIL/uL (ref 3.87–5.11)
RDW: 12.7 % (ref 11.5–15.5)
WBC: 4.5 10*3/uL (ref 4.0–10.5)

## 2015-02-27 LAB — LIPID PANEL
Cholesterol: 214 mg/dL — ABNORMAL HIGH (ref 125–200)
HDL: 47 mg/dL (ref >=46)
LDL CALC: 158 mg/dL — AB (ref <130)
TRIGLYCERIDES: 47 mg/dL (ref <150)
Total CHOL/HDL Ratio: 4.6 Ratio (ref <=5.0)
VLDL: 9 mg/dL (ref <30)

## 2015-02-27 LAB — TSH: TSH: 1.614 u[IU]/mL (ref 0.350–4.500)

## 2015-02-27 MED ORDER — AMLODIPINE BESYLATE 5 MG PO TABS: 2.5000 mg | ORAL_TABLET | Freq: Every day | ORAL | Status: DC

## 2015-02-27 MED ORDER — LOSARTAN POTASSIUM-HCTZ 100-25 MG PO TABS: 1.0000 | ORAL_TABLET | Freq: Every day | ORAL | Status: DC

## 2015-02-27 MED ORDER — METOPROLOL TARTRATE 50 MG PO TABS: 50.0000 mg | ORAL_TABLET | Freq: Two times a day (BID) | ORAL | Status: DC

## 2015-02-27 NOTE — Progress Notes (Signed)
Assessment and Plan:  Hypertension:  -Continue medication -monitor blood pressure at home. -Continue DASH diet -Reminder to go to the ER if any CP, SOB, nausea, dizziness, severe HA, changes vision/speech, left arm numbness and tingling and jaw pain.  Cholesterol - Continue diet and exercise -Check cholesterol.   Diabetes without complications -Continue diet and exercise.  -Check A1C  Vitamin D Def -check level -continue medications.   Low back pain -referred to ortho  -mobic -discussed weight loss and the necessity of it.  Continue diet and meds as discussed. Further disposition pending results of labs. Discussed med's effects and SE's.    HPI 64 y.o. female  presents for 3 month follow up with hypertension, hyperlipidemia, diabetes and vitamin D deficiency.   Her blood pressure has not been controlled at home, today their BP is BP: (!) 144/98 mmHg.She does not workout. She denies chest pain, shortness of breath, dizziness.   She is on cholesterol medication and denies myalgias. Her cholesterol is at goal. The cholesterol was:  06/10/2014: Cholesterol 203*; HDL 44; LDL Cholesterol 143*; Triglycerides 79   She has not been working on diet and exercise for diabetes without complications, she is on bASA, she is not on ACE/ARB, and denies  foot ulcerations, hyperglycemia, hypoglycemia , increased appetite, nausea, paresthesia of the feet, polydipsia, polyuria, visual disturbances, vomiting and weight loss. Last A1C was: 06/10/2014: Hgb A1c MFr Bld 5.8*   Patient is on Vitamin D supplement. 06/10/2014: Vit D, 25-Hydroxy 14*  Patient reports that she has been having some headaches and also some mild shortness of breath.  She reports that she does not currently have air conditioning currently and that is when she gets shortness of breath.  She also feels that the shortness of breath when she is up and moving.    Current Medications:  Current Outpatient Prescriptions on File Prior  to Visit  Medication Sig Dispense Refill  . acetaminophen (TYLENOL) 500 MG tablet Take 500 mg by mouth every 6 (six) hours as needed for pain.    . Cholecalciferol (VITAMIN D3) 5000 UNITS CAPS Take 1 capsule by mouth daily.    Marland Kitchen losartan-hydrochlorothiazide (HYZAAR) 100-25 MG per tablet Take 1 tablet by mouth daily. 30 tablet 2  . Magnesium Oxide 250 MG TABS Take 1 tablet by mouth daily.     . meloxicam (MOBIC) 7.5 MG tablet Take 7.5 mg by mouth daily.     . metoprolol (LOPRESSOR) 50 MG tablet Take 1 tablet (50 mg total) by mouth 2 (two) times daily. 60 tablet 2  . mometasone (NASONEX) 50 MCG/ACT nasal spray Place 2 sprays into the nose daily. 17 g 2  . rosuvastatin (CRESTOR) 10 MG tablet Take 1 tablet (10 mg total) by mouth daily. 42 tablet 0   No current facility-administered medications on file prior to visit.   Medical History:  Past Medical History  Diagnosis Date  . Acute meniscal tear of knee     RIGHT  . History of DVT (deep vein thrombosis)     IN PREGNANCY  . RBBB (right bundle branch block with left anterior fascicular block)   . Sinus tachycardia   . Pulmonary embolism     "3 today" (11/21/2012)  . Exertional shortness of breath     "this week" (11/21/2012)  . Mixed hyperlipidemia   . HTN (hypertension)     CARDIOLOGIST--  DR HOCHREIN-- CLEARANCE NOTE IN EPIC AND CHART FROM 08-21-2012  . Seasonal allergies   . Arthritis     "  right knee" (11/21/2012)  . Elevated hemoglobin A1c   . Obesity   . Vitamin D deficiency    Allergies:  Allergies  Allergen Reactions  . Hydrocodone     Itch  . Oxycodone Rash  . Tramadol Rash     Review of Systems:  Review of Systems  Constitutional: Negative for fever, chills and malaise/fatigue.  HENT: Negative for congestion, ear discharge, sore throat and tinnitus.   Respiratory: Positive for shortness of breath. Negative for cough and wheezing.   Cardiovascular: Negative for chest pain, palpitations and leg swelling.   Gastrointestinal: Negative for diarrhea, constipation, blood in stool and melena.  Genitourinary: Negative.   Musculoskeletal: Positive for back pain.  Skin: Negative.   Neurological: Positive for weakness and headaches. Negative for dizziness, sensory change and loss of consciousness.  Psychiatric/Behavioral: Negative for depression. The patient is not nervous/anxious and does not have insomnia.     Family history- Review and unchanged  Social history- Review and unchanged  Physical Exam: BP 144/98 mmHg  Pulse 100  Temp(Src) 98.2 F (36.8 C) (Temporal)  Resp 20  Ht 5' 2.75" (1.594 m)  Wt 320 lb (145.151 kg)  BMI 57.13 kg/m2 Wt Readings from Last 3 Encounters:  02/27/15 320 lb (145.151 kg)  09/04/14 310 lb (140.615 kg)  06/10/14 310 lb (140.615 kg)   General Appearance: Well nourished well developed, non-toxic appearing morbidly obese, in no apparent distress. Eyes: PERRLA, EOMs, conjunctiva no swelling or erythema ENT/Mouth: Ear canals clear with no erythema, swelling, or discharge.  TMs normal bilaterally, oropharynx clear, moist, with no exudate.   Neck: Supple, thyroid normal, no JVD, no cervical adenopathy.  Respiratory: Respiratory effort normal, breath sounds clear A&P, no wheeze, rhonchi or rales noted.  No retractions, no accessory muscle usage Cardio: RRR with no MRGs. No noted edema. Borderline tachycardic Abdomen: Soft, + BS.  Non tender, no guarding, rebound, hernias, masses. Musculoskeletal: Full ROM, 5/5 strength, Normal gait, tenderness to palpation of the lumbar spine Skin: Warm, dry without rashes, lesions, ecchymosis.  Neuro: Awake and oriented X 3, Cranial nerves intact. No cerebellar symptoms.  Psych: normal affect, Insight and Judgment appropriate.    Terri Piedra, PA-C 11:34 AM [facility]

## 2015-02-28 LAB — INSULIN, RANDOM: INSULIN: 15.2 u[IU]/mL (ref 2.0–19.6)

## 2015-02-28 LAB — VITAMIN D 25 HYDROXY (VIT D DEFICIENCY, FRACTURES): VIT D 25 HYDROXY: 12 ng/mL — AB (ref 30–100)

## 2015-03-03 ENCOUNTER — Other Ambulatory Visit: Payer: Self-pay | Admitting: Internal Medicine

## 2015-03-03 MED ORDER — MELOXICAM 15 MG PO TABS: 15.0000 mg | ORAL_TABLET | Freq: Every day | ORAL | Status: DC

## 2015-04-28 ENCOUNTER — Encounter: Payer: Self-pay | Admitting: Internal Medicine

## 2015-04-28 ENCOUNTER — Ambulatory Visit (INDEPENDENT_AMBULATORY_CARE_PROVIDER_SITE_OTHER): Payer: BC Managed Care – PPO | Admitting: Internal Medicine

## 2015-04-28 VITALS — BP 162/94 | HR 110 | Temp 98.0°F | Resp 20 | Ht 62.75 in | Wt 321.0 lb

## 2015-04-28 DIAGNOSIS — M545 Low back pain: Secondary | ICD-10-CM | POA: Diagnosis not present

## 2015-04-28 DIAGNOSIS — I10 Essential (primary) hypertension: Secondary | ICD-10-CM | POA: Diagnosis not present

## 2015-04-28 DIAGNOSIS — Z23 Encounter for immunization: Secondary | ICD-10-CM | POA: Diagnosis not present

## 2015-04-28 DIAGNOSIS — J069 Acute upper respiratory infection, unspecified: Secondary | ICD-10-CM

## 2015-04-28 MED ORDER — PREDNISONE 20 MG PO TABS: ORAL_TABLET | ORAL | Status: DC

## 2015-04-28 MED ORDER — METOPROLOL TARTRATE 100 MG PO TABS: ORAL_TABLET | ORAL | Status: DC

## 2015-04-28 MED ORDER — AZITHROMYCIN 250 MG PO TABS: ORAL_TABLET | ORAL | Status: DC

## 2015-04-28 NOTE — Patient Instructions (Addendum)
Please start taking 2 tablets of the metoprolol daily.  Please make sure that you take this with food.  You will need to have your blood pressure checked either at the fire station or at the [facility].  If you are having blood pressure over 150/90 or greater than you can call me  Please take the prednisone until it is gone.  Please take the zpak until it is gone.    Use saline in your nose as often as you can.  Please take benadryl at night time to help you sleep.

## 2015-04-28 NOTE — Progress Notes (Signed)
HPI  Patient presents to the office for evaluation of cough.  It has been going on for 3 days.  Patient reports wet cough with mild yellow mucous production.  They also endorse change in voice, chills, postnasal drip and nasal congestion, shortenss of breath, DOE, pain in her sides with coughing.  .  They have tried mucinex but that hasn't helped much.  They report that nothing has worked.  They admits to other sick contacts.    Review of Systems  Constitutional: Negative for fever, chills and malaise/fatigue.  HENT: Negative for congestion, ear pain and sore throat.   Respiratory: Negative for cough, shortness of breath and wheezing.   Cardiovascular: Negative for chest pain, palpitations and leg swelling.  Skin: Negative.   Neurological: Negative for headaches.    PE:  Filed Vitals:   04/28/15 1519  BP: 162/94  Pulse: 110  Temp: 98 F (36.7 C)  Resp: 20     General:  Alert and non-toxic, morbidly obese, WDWN, NAD HEENT: NCAT, PERLA, EOM normal, no occular discharge or erythema.  Nasal mucosal edema with sinus tenderness to palpation.  Oropharynx clear with minimal oropharyngeal edema and erythema.  Mucous membranes moist and pink. Neck:  Cervical adenopathy Chest:  RRR no MRGs.  Lungs clear to auscultation A&P with no wheezes rhonchi or rales.   Abdomen: +BS x 4 quadrants, soft, non-tender, no guarding, rigidity, or rebound. Skin: warm and dry no rash Neuro: A&Ox4, CN II-XII grossly intact  Assessment and Plan:   1. Essential hypertension  - metoprolol (LOPRESSOR) 100 MG tablet; Please take 100 mg once daily with food.  Dispense: 180 tablet; Refill: 0  2. Acute URI -zpak -prednisone  3. Low back pain without sciatica, unspecified back pain laterality  - Ambulatory referral to Orthopedics  4. Need for prophylactic vaccination and inoculation against influenza -declined  5. Need for prophylactic vaccination with combined diphtheria-tetanus-pertussis (DTP)  vaccine - Tdap vaccine greater than or equal to 7yo IM

## 2015-06-12 ENCOUNTER — Ambulatory Visit (INDEPENDENT_AMBULATORY_CARE_PROVIDER_SITE_OTHER): Payer: BC Managed Care – PPO | Admitting: Physician Assistant

## 2015-06-12 ENCOUNTER — Encounter: Payer: Self-pay | Admitting: Physician Assistant

## 2015-06-12 ENCOUNTER — Ambulatory Visit (HOSPITAL_COMMUNITY)
Admission: RE | Admit: 2015-06-12 | Discharge: 2015-06-12 | Disposition: A | Discharge status: Home / Self Care | Payer: BC Managed Care – PPO | Source: Ambulatory Visit | Attending: Physician Assistant | Admitting: Physician Assistant

## 2015-06-12 ENCOUNTER — Telehealth: Payer: Self-pay | Admitting: Physician Assistant

## 2015-06-12 ENCOUNTER — Ambulatory Visit (HOSPITAL_COMMUNITY): Payer: BC Managed Care – PPO

## 2015-06-12 VITALS — BP 140/120 | HR 79 | Temp 97.7°F | Resp 18 | Ht 62.75 in | Wt 328.0 lb

## 2015-06-12 DIAGNOSIS — M7989 Other specified soft tissue disorders: Secondary | ICD-10-CM | POA: Insufficient documentation

## 2015-06-12 DIAGNOSIS — R600 Localized edema: Secondary | ICD-10-CM | POA: Insufficient documentation

## 2015-06-12 DIAGNOSIS — I10 Essential (primary) hypertension: Secondary | ICD-10-CM | POA: Diagnosis not present

## 2015-06-12 DIAGNOSIS — R0600 Dyspnea, unspecified: Secondary | ICD-10-CM | POA: Insufficient documentation

## 2015-06-12 DIAGNOSIS — Z86718 Personal history of other venous thrombosis and embolism: Secondary | ICD-10-CM | POA: Insufficient documentation

## 2015-06-12 MED ORDER — IPRATROPIUM-ALBUTEROL 0.5-2.5 (3) MG/3ML IN SOLN
3.0000 mL | Freq: Once | RESPIRATORY_TRACT | Status: AC
  Administered 2015-06-12: 3 mL via RESPIRATORY_TRACT

## 2015-06-12 MED ORDER — RIVAROXABAN (XARELTO) VTE STARTER PACK (15 & 20 MG): ORAL_TABLET | ORAL | Status: DC

## 2015-06-12 MED ORDER — LOSARTAN POTASSIUM-HCTZ 100-25 MG PO TABS: 1.0000 | ORAL_TABLET | Freq: Every day | ORAL | Status: DC

## 2015-06-12 MED ORDER — ALBUTEROL SULFATE 108 (90 BASE) MCG/ACT IN AEPB: 108.0000 ug | INHALATION_SPRAY | RESPIRATORY_TRACT | Status: DC | PRN

## 2015-06-12 MED ORDER — METOPROLOL TARTRATE 100 MG PO TABS: ORAL_TABLET | ORAL | Status: DC

## 2015-06-12 MED ORDER — LEVOFLOXACIN 500 MG PO TABS: 500.0000 mg | ORAL_TABLET | Freq: Every day | ORAL | Status: DC

## 2015-06-12 NOTE — Telephone Encounter (Signed)
Told patient US results of legs negative for DVT. She was instructed to start back on bASA and to not start the xarelto samples but to make sure she was taking the Levaquin sent in for her. She will call the office if any changes and she will go to the ER if any worsening SOB/CP. She may need sleep study or may need CT scan chest if SOB is not improved. Patient voiced understanding.

## 2015-06-12 NOTE — Progress Notes (Signed)
*  PRELIMINARY RESULTS* Vascular Ultrasound Lower extremity venous dupex has been completed.  Preliminary findings: No obvious DVT noted bilaterally.    Farrel DemarkJill Eunice, RDMS, RVT  06/12/2015, 4:01 PM

## 2015-06-12 NOTE — Progress Notes (Signed)
Subjective:    Patient ID: Kerry Liu, female    DOB: 10/30/1950, 64 y.o.   MRN: [id number]  HPI 64 y.o. obese AAF with history of RBBB, HTN, PE in 2014 following knee surgery and DVT with pregnancy presents with SOB. She was treated with zpak and prednisone 04/28/2015. She states she has been coming and going with symptoms since treatment. She states she still has some sinus congestion, cough without mucus, she has worsening SOB with exertion, and she has some palpitations.   She states her BP has been elevated at home. She is on losartan 100/25 and metoprolol 100mg , has been out of the medications since Tuesday.   Blood pressure 140/120, pulse 79, temperature 97.7 F (36.5 C), temperature source Temporal, resp. rate 18, height 5' 2.75" (1.594 m), weight 328 lb (148.78 kg), SpO2 96 %.   Past Medical History  Diagnosis Date  . Acute meniscal tear of knee     RIGHT  . History of DVT (deep vein thrombosis)     IN PREGNANCY  . RBBB (right bundle branch block with left anterior fascicular block)   . Sinus tachycardia (HCC)   . Pulmonary embolism (HCC)     "3 today" (11/21/2012)  . Exertional shortness of breath     "this week" (11/21/2012)  . Mixed hyperlipidemia   . HTN (hypertension)     CARDIOLOGIST--  DR HOCHREIN-- CLEARANCE NOTE IN EPIC AND CHART FROM 08-21-2012  . Seasonal allergies   . Arthritis     "right knee" (11/21/2012)  . Elevated hemoglobin A1c   . Obesity   . Vitamin D deficiency    Current Outpatient Prescriptions on File Prior to Visit  Medication Sig Dispense Refill  . acetaminophen (TYLENOL) 500 MG tablet Take 500 mg by mouth every 6 (six) hours as needed for pain.    Marland Kitchen. aspirin 81 MG tablet Take 81 mg by mouth every other day.    Marland Kitchen. azithromycin (ZITHROMAX Z-PAK) 250 MG tablet 2 po day one, then 1 daily x 4 days 5 tablet 0  . Cholecalciferol (VITAMIN D3) 5000 UNITS CAPS Take 1 capsule by mouth daily.    Marland Kitchen. losartan-hydrochlorothiazide (HYZAAR) 100-25 MG per  tablet Take 1 tablet by mouth daily. 30 tablet 2  . Magnesium Oxide 250 MG TABS Take 1 tablet by mouth daily.     . meloxicam (MOBIC) 15 MG tablet Take 1 tablet (15 mg total) by mouth daily. 90 tablet 0  . metoprolol (LOPRESSOR) 100 MG tablet Please take 100 mg twice daily with food. 180 tablet 0  . mometasone (NASONEX) 50 MCG/ACT nasal spray Place 2 sprays into the nose daily. 17 g 2  . rosuvastatin (CRESTOR) 10 MG tablet Take 1 tablet (10 mg total) by mouth daily. 42 tablet 0  . predniSONE (DELTASONE) 20 MG tablet 3 tabs po daily x 3 days, then 2 tabs x 3 days, then 1.5 tabs x 3 days, then 1 tab x 3 days, then 0.5 tabs x 3 days (Patient not taking: Reported on 06/12/2015) 27 tablet 0   No current facility-administered medications on file prior to visit.    Review of Systems  Constitutional: Negative for fever and chills.  HENT: Positive for congestion, rhinorrhea and sinus pressure. Negative for ear pain and sore throat.   Respiratory: Positive for cough, chest tightness and shortness of breath. Negative for apnea, choking, wheezing and stridor.   Cardiovascular: Positive for palpitations and leg swelling. Negative for chest pain.  Gastrointestinal: Negative.  Genitourinary: Negative.   Skin: Negative.   Neurological: Negative for headaches.       Objective:   Physical Exam  Constitutional: She is oriented to person, place, and time. No distress.  obese  HENT:  Head: Normocephalic and atraumatic.  Crowded mouth  Eyes: Conjunctivae are normal. Pupils are equal, round, and reactive to light.  Neck: Normal range of motion. Neck supple.  Cardiovascular: Normal rate, regular rhythm and normal heart sounds.   Pulmonary/Chest: Effort normal. No accessory muscle usage. No tachypnea. No respiratory distress. She has wheezes (diffuse expiratory).  Abdominal: Soft. There is no tenderness.  obese  Musculoskeletal:  Left leg swelling greater than right with some tenderness, negative  homen's.   Lymphadenopathy:    She has no cervical adenopathy.  Neurological: She is alert and oriented to person, place, and time.  Skin: No rash noted.       Assessment & Plan:  Obese black female with history of DVT/PE x 2 No current provoking factors but with left leg swelling and worsening dypsnea will get [place] and put on starter pack xarelto, breathing treatment in the office given, levaquin and albuterol given.  Getting [place] today at 130, if + will get CTA chest and continue xarelto Go to the hospital if any worsening SOB/CP  HTN No CP, will pick up HTN meds today, has been out x 2 days Restart Go to ER if any worsening SOB/CP.

## 2015-07-10 ENCOUNTER — Ambulatory Visit (INDEPENDENT_AMBULATORY_CARE_PROVIDER_SITE_OTHER): Payer: Medicare Other | Admitting: Internal Medicine

## 2015-07-10 ENCOUNTER — Encounter: Payer: Self-pay | Admitting: Internal Medicine

## 2015-07-10 VITALS — BP 148/96 | HR 84 | Temp 97.2°F | Resp 16 | Ht 62.75 in | Wt 321.0 lb

## 2015-07-10 DIAGNOSIS — Z79899 Other long term (current) drug therapy: Secondary | ICD-10-CM | POA: Diagnosis not present

## 2015-07-10 DIAGNOSIS — Z1389 Encounter for screening for other disorder: Secondary | ICD-10-CM | POA: Diagnosis not present

## 2015-07-10 DIAGNOSIS — E559 Vitamin D deficiency, unspecified: Secondary | ICD-10-CM

## 2015-07-10 DIAGNOSIS — R7303 Prediabetes: Secondary | ICD-10-CM | POA: Diagnosis not present

## 2015-07-10 DIAGNOSIS — Z1331 Encounter for screening for depression: Secondary | ICD-10-CM

## 2015-07-10 DIAGNOSIS — E782 Mixed hyperlipidemia: Secondary | ICD-10-CM | POA: Diagnosis not present

## 2015-07-10 DIAGNOSIS — I10 Essential (primary) hypertension: Secondary | ICD-10-CM | POA: Diagnosis not present

## 2015-07-10 LAB — CBC WITH DIFFERENTIAL/PLATELET
Basophils Absolute: 0.1 10*3/uL (ref 0.0–0.1)
Basophils Relative: 1 % (ref 0–1)
EOS ABS: 0.2 10*3/uL (ref 0.0–0.7)
Eosinophils Relative: 4 % (ref 0–5)
HCT: 44.1 % (ref 36.0–46.0)
HEMOGLOBIN: 14.4 g/dL (ref 12.0–15.0)
LYMPHS ABS: 1.5 10*3/uL (ref 0.7–4.0)
Lymphocytes Relative: 28 % (ref 12–46)
MCH: 28.9 pg (ref 26.0–34.0)
MCHC: 32.7 g/dL (ref 30.0–36.0)
MCV: 88.6 fL (ref 78.0–100.0)
MONO ABS: 0.7 10*3/uL (ref 0.1–1.0)
MONOS PCT: 13 % — AB (ref 3–12)
MPV: 10.1 fL (ref 8.6–12.4)
NEUTROS PCT: 54 % (ref 43–77)
Neutro Abs: 2.8 10*3/uL (ref 1.7–7.7)
PLATELETS: 263 10*3/uL (ref 150–400)
RBC: 4.98 MIL/uL (ref 3.87–5.11)
RDW: 12.5 % (ref 11.5–15.5)
WBC: 5.2 10*3/uL (ref 4.0–10.5)

## 2015-07-10 LAB — LIPID PANEL
Cholesterol: 205 mg/dL — ABNORMAL HIGH (ref 125–200)
HDL: 47 mg/dL (ref >=46)
LDL Cholesterol: 147 mg/dL — ABNORMAL HIGH (ref <130)
Total CHOL/HDL Ratio: 4.4 Ratio (ref <=5.0)
Triglycerides: 53 mg/dL (ref <150)
VLDL: 11 mg/dL (ref <30)

## 2015-07-10 LAB — BASIC METABOLIC PANEL WITH GFR
BUN: 20 mg/dL (ref 7–25)
CHLORIDE: 106 mmol/L (ref 98–110)
CO2: 27 mmol/L (ref 20–31)
CREATININE: 0.93 mg/dL (ref 0.50–0.99)
Calcium: 9.5 mg/dL (ref 8.6–10.4)
GFR, Est African American: 76 mL/min (ref >=60)
GFR, Est Non African American: 66 mL/min (ref >=60)
Glucose, Bld: 112 mg/dL — ABNORMAL HIGH (ref 65–99)
Potassium: 3.9 mmol/L (ref 3.5–5.3)
Sodium: 144 mmol/L (ref 135–146)

## 2015-07-10 LAB — HEMOGLOBIN A1C
HEMOGLOBIN A1C: 6.1 % — AB (ref <5.7)
MEAN PLASMA GLUCOSE: 128 mg/dL — AB (ref <117)

## 2015-07-10 LAB — HEPATIC FUNCTION PANEL
ALBUMIN: 3.7 g/dL (ref 3.6–5.1)
ALT: 21 U/L (ref 6–29)
AST: 20 U/L (ref 10–35)
Alkaline Phosphatase: 102 U/L (ref 33–130)
Bilirubin, Direct: 0.1 mg/dL (ref <=0.2)
Indirect Bilirubin: 0.5 mg/dL (ref 0.2–1.2)
Total Bilirubin: 0.6 mg/dL (ref 0.2–1.2)
Total Protein: 6.4 g/dL (ref 6.1–8.1)

## 2015-07-10 LAB — TSH: TSH: 3.865 u[IU]/mL (ref 0.350–4.500)

## 2015-07-10 LAB — MAGNESIUM: MAGNESIUM: 2.1 mg/dL (ref 1.5–2.5)

## 2015-07-10 MED ORDER — ATORVASTATIN CALCIUM 80 MG PO TABS: ORAL_TABLET | ORAL | Status: DC

## 2015-07-10 NOTE — Progress Notes (Signed)
Patient ID: Kerry Liu, female   DOB: Dec 01, 1950, 65 y.o.   MRN: [id number]   This very nice 65 y.o. MBF presents for follow up with Hypertension, Hyperlipidemia, Pre-Diabetes and Vitamin D Deficiency. In May 2014 she had a large PE following Knee surgery and was treated with Lesia Hausen thru May 2015 and transitioned to low dose ASA which she apparently has self d/c'd.    Patient is treated for HTN since 2009 & BP has been controlled at home. Today's BP is elevated at 148/96 and rechecked at 142/88. Patient has had no complaints of any cardiac type chest pain, palpitations, dyspnea/orthopnea/PND, dizziness, claudication, or dependent edema.   Hyperlipidemia is not controlled with non- compliant diet & sporadic medications and as patient had previously been on sx's of Crestor which are no longer available.  Patient denies myalgias or other med SE's. Last Lipids were not at goal Cholesterol 214*; HDL 47; LDL 158*; Triglycerides 47 on 02/27/2015.   Also, the patient has history of Severe Morbid Obesity (BMI 57+) and is screened expectantly for PreDiabetes and has had no symptoms of reactive hypoglycemia, diabetic polys, paresthesias or visual blurring.  Last A1c was 5.9% on 02/27/2015.   Further, the patient also has history of Vitamin D Deficiency of "20" in June 2013 and sporadically supplements Vit D. Last vitamin D was low at 12 on 02/27/2015.  Medication Sig  . acetaminophen  500 MG tablet Take 500 mg by mouth every 6 (six) hours as needed for pain.  Marland Kitchen VITAMIN D 5000 UNITS  Take 1 capsule by mouth daily.  Marland Kitchen losartan-hctz 100-25 MG tablet Take 1 tablet by mouth daily.  . Magnesium  250 MG  Take 1 tablet by mouth daily.   . metoprolol  100 MG tablet Please take 100 mg twice daily with food.  Marland Kitchen NASONEX nasal spray Place 2 sprays into the nose daily.  . rosuvastatin  10 MG tablet Take 1 tablet (10 mg total) by mouth daily.   Allergies  Allergen Reactions  . Hydrocodone     Itch  . Oxycodone Rash  .  Tramadol Rash   PMHx:   Past Medical History  Diagnosis Date  . Acute meniscal tear of knee     RIGHT  . History of DVT (deep vein thrombosis)     IN PREGNANCY  . RBBB (right bundle branch block with left anterior fascicular block)   . Sinus tachycardia (HCC)   . Pulmonary embolism (HCC)     "3 today" (11/21/2012)  . Exertional shortness of breath     "this week" (11/21/2012)  . Mixed hyperlipidemia   . HTN (hypertension)     CARDIOLOGIST--  DR HOCHREIN-- CLEARANCE NOTE IN EPIC AND CHART FROM 08-21-2012  . Seasonal allergies   . Arthritis     "right knee" (11/21/2012)  . Elevated hemoglobin A1c   . Obesity   . Vitamin D deficiency    Immunization History  Administered Date(s) Administered  . Tdap 04/28/2015   Past Surgical History  Procedure Laterality Date  . Knee arthroscopy Right 12-09-2002  . Transthoracic echocardiogram  11-17-2010    MILD LVH/ EF 60-65%/ GRADE I DIASTOLIC DYSFUNCTION/ MILD LAE / MILD RAE  . Total abdominal hysterectomy w/ bilateral salpingoophorectomy  ~ 1986    W/ BILATERAL SALPINGOOPHORECTOMY  . Knee arthroscopy with medial menisectomy Right 10/05/2012    Procedure: KNEE ARTHROSCOPY WITH MEDIAL MENISECTOMY;  Surgeon: Jacki Cones, MD;  Location: [facility];  Service: Orthopedics;  Laterality: Right;  . Ivc filter placed in may 2014 N/A    FHx:    Reviewed / unchanged  SHx:    Reviewed / unchanged  Systems Review:  Constitutional: Denies fever, chills, wt changes, headaches, insomnia, fatigue, night sweats, change in appetite. Eyes: Denies redness, blurred vision, diplopia, discharge, itchy, watery eyes.  ENT: Denies discharge, congestion, post nasal drip, epistaxis, sore throat, earache, hearing loss, dental pain, tinnitus, vertigo, sinus pain, snoring.  CV: Denies chest pain, palpitations, irregular heartbeat, syncope, dyspnea, diaphoresis, orthopnea, PND, claudication or edema. Respiratory: denies cough, dyspnea, DOE,  pleurisy, hoarseness, laryngitis, wheezing.  Gastrointestinal: Denies dysphagia, odynophagia, heartburn, reflux, water brash, abdominal pain or cramps, nausea, vomiting, bloating, diarrhea, constipation, hematemesis, melena, hematochezia  or hemorrhoids. Genitourinary: Denies dysuria, frequency, urgency, nocturia, hesitancy, discharge, hematuria or flank pain. Musculoskeletal: Denies arthralgias, myalgias, stiffness, jt. swelling, pain, limping or strain/sprain. Denies falls. Skin: Denies pruritus, rash, hives, warts, acne, eczema or change in skin lesion(s). Neuro: No weakness, tremor, incoordination, spasms, paresthesia or pain. Psychiatric: Denies confusion, memory loss or sensory loss. Denies Depression or mood swings. Endo: Denies change in weight, skin or hair change.  Heme/Lymph: No excessive bleeding, bruising or enlarged lymph nodes.  Physical Exam  BP 148/96 and re-ck'd at 142/88   Pulse 84  Temp 97.2 F   Resp 16  Ht 5' 2.75"   Wt 321 lb     BMI 57.31   Appears over nourished, Morbidly Obese and in no distress. Eyes: PERRLA, EOMs, conjunctiva no swelling or erythema. Sinuses: No frontal/maxillary tenderness ENT/Mouth: EAC's clear, TM's nl w/o erythema, bulging. Nares clear w/o erythema, swelling, exudates. Oropharynx clear without erythema or exudates. Oral hygiene is good. Tongue normal, non obstructing. Hearing intact.  Neck: Supple. Thyroid nl. Car 2+/2+ without bruits, nodes or JVD. Chest: Respirations nl with BS clear & equal w/o rales, rhonchi, wheezing or stridor.  Cor: Heart sounds normal w/ regular rate and rhythm without sig. murmurs, gallops, clicks, or rubs. Peripheral pulses normal and equal  without edema.  Abdomen: Rotund.Soft & bowel sounds normal. Non-tender w/o guarding, rebound, hernias, masses, or organomegaly.  Lymphatics: Unremarkable.  Musculoskeletal: Full ROM all peripheral extremities, joint stability, 5/5 strength, and normal gait.  Skin: Warm, dry  without exposed rashes, lesions or ecchymosis apparent.  Neuro: Cranial nerves intact, reflexes equal bilaterally. Sensory-motor testing grossly intact. Tendon reflexes grossly intact.  Pysch: Alert & oriented x 3.  Insight and judgement nl & appropriate. No ideations.  Assessment and Plan:  1. Essential hypertension  - TSH  2. Mixed hyperlipidemia - due to unavailability of Crestor sx's , sent in Rx Atorvastatin 80 mg to begin 1/2 tablet 3 x wk on MWF  - Lipid panel - TSH  3. Prediabetes  - Hemoglobin A1c - Insulin, random  4. Vitamin D deficiency  - VITAMIN D 25 Hydroxy   5. Medication management  - recommend restart bASA for Thrombosis ptrophylaxis - CBC with Differential/Platelet - BASIC METABOLIC PANEL WITH GFR - Hepatic function panel - Magnesium   Recommended regular exercise, BP monitoring, weight control, and discussed med and SE's. Recommended labs to assess and monitor clinical status. Further disposition pending results of labs. Over 30 minutes of exam, counseling, chart review was performed

## 2015-07-10 NOTE — Patient Instructions (Signed)

## 2015-07-11 LAB — INSULIN, RANDOM: INSULIN: 16.7 u[IU]/mL (ref 2.0–19.6)

## 2015-07-11 LAB — VITAMIN D 25 HYDROXY (VIT D DEFICIENCY, FRACTURES): VIT D 25 HYDROXY: 17 ng/mL — AB (ref 30–100)

## 2015-09-02 DIAGNOSIS — M1712 Unilateral primary osteoarthritis, left knee: Secondary | ICD-10-CM | POA: Diagnosis not present

## 2015-09-02 DIAGNOSIS — M25562 Pain in left knee: Secondary | ICD-10-CM | POA: Diagnosis not present

## 2015-10-19 ENCOUNTER — Encounter: Payer: Self-pay | Admitting: Internal Medicine

## 2015-10-22 ENCOUNTER — Ambulatory Visit (INDEPENDENT_AMBULATORY_CARE_PROVIDER_SITE_OTHER): Payer: Medicare Other | Admitting: Internal Medicine

## 2015-10-22 ENCOUNTER — Encounter: Payer: Self-pay | Admitting: Internal Medicine

## 2015-10-22 VITALS — BP 182/104 | HR 98 | Temp 98.0°F | Resp 20 | Ht 62.5 in | Wt 318.0 lb

## 2015-10-22 DIAGNOSIS — R7309 Other abnormal glucose: Secondary | ICD-10-CM | POA: Diagnosis not present

## 2015-10-22 DIAGNOSIS — Z136 Encounter for screening for cardiovascular disorders: Secondary | ICD-10-CM | POA: Diagnosis not present

## 2015-10-22 DIAGNOSIS — R6889 Other general symptoms and signs: Secondary | ICD-10-CM | POA: Diagnosis not present

## 2015-10-22 DIAGNOSIS — I10 Essential (primary) hypertension: Secondary | ICD-10-CM

## 2015-10-22 DIAGNOSIS — G4730 Sleep apnea, unspecified: Secondary | ICD-10-CM | POA: Diagnosis not present

## 2015-10-22 DIAGNOSIS — E2839 Other primary ovarian failure: Secondary | ICD-10-CM | POA: Diagnosis not present

## 2015-10-22 DIAGNOSIS — E559 Vitamin D deficiency, unspecified: Secondary | ICD-10-CM | POA: Diagnosis not present

## 2015-10-22 DIAGNOSIS — E782 Mixed hyperlipidemia: Secondary | ICD-10-CM

## 2015-10-22 DIAGNOSIS — Z0001 Encounter for general adult medical examination with abnormal findings: Secondary | ICD-10-CM | POA: Diagnosis not present

## 2015-10-22 DIAGNOSIS — R7303 Prediabetes: Secondary | ICD-10-CM | POA: Diagnosis not present

## 2015-10-22 DIAGNOSIS — Z1212 Encounter for screening for malignant neoplasm of rectum: Secondary | ICD-10-CM

## 2015-10-22 DIAGNOSIS — Z0000 Encounter for general adult medical examination without abnormal findings: Secondary | ICD-10-CM | POA: Diagnosis not present

## 2015-10-22 LAB — CBC WITH DIFFERENTIAL/PLATELET
BASOS ABS: 57 {cells}/uL (ref 0–200)
Basophils Relative: 1 %
EOS PCT: 1 %
Eosinophils Absolute: 57 cells/uL (ref 15–500)
HCT: 41.4 % (ref 35.0–45.0)
Hemoglobin: 13.3 g/dL (ref 11.7–15.5)
LYMPHS ABS: 1254 {cells}/uL (ref 850–3900)
Lymphocytes Relative: 22 %
MCH: 28.8 pg (ref 27.0–33.0)
MCHC: 32.1 g/dL (ref 32.0–36.0)
MCV: 89.6 fL (ref 80.0–100.0)
MONOS PCT: 10 %
MPV: 10.5 fL (ref 7.5–12.5)
Monocytes Absolute: 570 cells/uL (ref 200–950)
NEUTROS PCT: 66 %
Neutro Abs: 3762 cells/uL (ref 1500–7800)
PLATELETS: 269 10*3/uL (ref 140–400)
RBC: 4.62 MIL/uL (ref 3.80–5.10)
RDW: 13.1 % (ref 11.0–15.0)
WBC: 5.7 10*3/uL (ref 3.8–10.8)

## 2015-10-22 LAB — BASIC METABOLIC PANEL WITH GFR
BUN: 15 mg/dL (ref 7–25)
CALCIUM: 9.3 mg/dL (ref 8.6–10.4)
CO2: 26 mmol/L (ref 20–31)
CREATININE: 0.86 mg/dL (ref 0.50–0.99)
Chloride: 105 mmol/L (ref 98–110)
GFR, EST AFRICAN AMERICAN: 84 mL/min (ref >=60)
GFR, Est Non African American: 73 mL/min (ref >=60)
Glucose, Bld: 116 mg/dL — ABNORMAL HIGH (ref 65–99)
Potassium: 4 mmol/L (ref 3.5–5.3)
SODIUM: 142 mmol/L (ref 135–146)

## 2015-10-22 LAB — HEPATIC FUNCTION PANEL
ALBUMIN: 3.7 g/dL (ref 3.6–5.1)
ALT: 22 U/L (ref 6–29)
AST: 17 U/L (ref 10–35)
Alkaline Phosphatase: 108 U/L (ref 33–130)
BILIRUBIN DIRECT: 0.1 mg/dL (ref <=0.2)
BILIRUBIN TOTAL: 0.8 mg/dL (ref 0.2–1.2)
Indirect Bilirubin: 0.7 mg/dL (ref 0.2–1.2)
Total Protein: 6.4 g/dL (ref 6.1–8.1)

## 2015-10-22 LAB — LIPID PANEL
CHOLESTEROL: 203 mg/dL — AB (ref 125–200)
HDL: 56 mg/dL (ref >=46)
LDL Cholesterol: 136 mg/dL — ABNORMAL HIGH (ref <130)
TRIGLYCERIDES: 53 mg/dL (ref <150)
Total CHOL/HDL Ratio: 3.6 Ratio (ref <=5.0)
VLDL: 11 mg/dL (ref <30)

## 2015-10-22 LAB — MAGNESIUM: MAGNESIUM: 2.1 mg/dL (ref 1.5–2.5)

## 2015-10-22 MED ORDER — AMLODIPINE BESYLATE 5 MG PO TABS: 5.0000 mg | ORAL_TABLET | Freq: Every day | ORAL | Status: DC

## 2015-10-22 NOTE — Progress Notes (Signed)
WELCOME TO MEDICARE WELLNESS VISIT AND CPE  Assessment:    1. Encounter for general adult medical examination with abnormal findings -[id number] will be due in 1 year - CBC with Differential/Platelet - BASIC METABOLIC PANEL WITH GFR - Hepatic function panel - Magnesium  2. Essential hypertension -very elevated today even on repeat reading of 178/110 -start amlodipine 5 mg daily -recheck in 2 weeks - Urinalysis, Routine w reflex microscopic (not at [facility]) - Microalbumin / creatinine urine ratio - EKG 12-Lead - [place], RETROPERITNL ABD,  LTD - TSH  3. Mixed hyperlipidemia - Lipid panel -cont lipitor  4. Prediabetes -diet and exercise recommended - Hemoglobin A1c - Insulin, random  5. Vitamin D deficiency -cont Vit D supplement - VITAMIN D 25 Hydroxy (Vit-D Deficiency, Fractures)  6. Screening for rectal cancer  - POC Hemoccult Bld/Stl (3-Cd Home Screen); Future  7. Estrogen deficiency  - DG Bone Density; Future  8. Sleep apnea  - Ambulatory referral to Sleep Studies     Over 40 minutes of exam, counseling, chart review and critical decision making was performed  Plan:   During the course of the visit the patient was educated and counseled about appropriate screening and preventive services including:    Pneumococcal vaccine   Prevnar 13  Influenza vaccine  Td vaccine  Screening electrocardiogram  Bone densitometry screening  Colorectal cancer screening  Diabetes screening  Glaucoma screening  Nutrition counseling   Advanced directives: requested  Conditions/risks identified: BMI: Discussed weight loss, diet, and increase physical activity.  Increase physical activity: AHA recommends 150 minutes of physical activity a week.  Medications reviewed Diabetes is at goal, ACE/ARB therapy: No, Reason not on Ace Inhibitor/ARB therapy:  hypotension Urinary Incontinence is not an issue: discussed non pharmacology and pharmacology options.  Fall risk:  low- discussed PT, home fall assessment, medications.    Subjective:  Kerry Liu is a 65 y.o. female who presents for Medicare Annual Wellness Visit and complete physical.  Date of last medicare wellness visit is unknown.  She has had elevated blood pressure treated with metoprolol and hyzaar. Her blood pressure has been controlled at home, today their BP is BP: (!) 182/104 mmHg.  She reports that she hasn't taken her medications today.  She reports that she is taking medications at night time.  She reports that she checks them at [facility] and [facility] and she gets around 128/90 or maybe mildly higher.    She does not workout. She denies chest pain, shortness of breath, dizziness.  She is on cholesterol medication and denies myalgias. Her cholesterol is not at goal. The cholesterol last visit was:   Lab Results  Component Value Date   CHOL 205* 07/10/2015   HDL 47 07/10/2015   LDLCALC 147* 07/10/2015   TRIG 53 07/10/2015   CHOLHDL 4.4 07/10/2015   She has had prediabetes which has been managed with diet and exercise.  She is not currently on medication.  She has not been working on diet and exercise for prediabetes, and denies foot ulcerations, hyperglycemia, hypoglycemia , increased appetite, nausea, paresthesia of the feet, polydipsia, polyuria, visual disturbances, vomiting and weight loss. Last A1C in the office was:  Lab Results  Component Value Date   HGBA1C 6.1* 07/10/2015   Last GFR:   Lab Results  Component Value Date   GFRNONAA 66 07/10/2015   Lab Results  Component Value Date   GFRAA 76 07/10/2015   Patient is on Vitamin D supplement.   Lab Results  Component Value Date   VD25OH 17* 07/10/2015     She reports that she is still having some issues with her knee.  She did see [facility].  She is due to go back and have another injection done next month.  She reports that she feels like she is getting better and is having some improvement in her shortness of  breath.  She does admit to some tingling in her right sided elbow and her hand.  She reports that it is worse with trying to hold her arm up.     Medication Review: Current Outpatient Prescriptions on File Prior to Visit  Medication Sig Dispense Refill  . acetaminophen (TYLENOL) 500 MG tablet Take 500 mg by mouth every 6 (six) hours as needed for pain.    Marland Kitchen. atorvastatin (LIPITOR) 80 MG tablet Take 1/2 to 1 tablet daily or as directed for Cholesterol 30 tablet 11  . Cholecalciferol (VITAMIN D3) 5000 UNITS CAPS Take 1 capsule by mouth daily.    Marland Kitchen. losartan-hydrochlorothiazide (HYZAAR) 100-25 MG tablet Take 1 tablet by mouth daily. 30 tablet 2  . Magnesium Oxide 250 MG TABS Take 1 tablet by mouth daily.     . metoprolol (LOPRESSOR) 100 MG tablet Please take 100 mg twice daily with food. 180 tablet 0  . PROAIR HFA 108 (90 Base) MCG/ACT inhaler     . mometasone (NASONEX) 50 MCG/ACT nasal spray Place 2 sprays into the nose daily. 17 g 2   No current facility-administered medications on file prior to visit.    Current Problems (verified) Patient Active Problem List   Diagnosis Date Noted  . Medication management 07/10/2015  . Mixed hyperlipidemia   . Seasonal allergies   . Prediabetes   . Morbid obesity (HCC)   . Vitamin D deficiency   . History of DVT (deep vein thrombosis)   . Pulmonary embolism (HCC)   . PE (pulmonary embolism) 11/21/2012  . Osteoarthritis of right knee 10/05/2012  . Essential hypertension 03/25/2009    Screening Tests Immunization History  Administered Date(s) Administered  . Tdap 04/28/2015    Preventative care: Last colonoscopy: Dr. Kinnie ScalesMedoff, 2016 Last mammogram: 12/18/13 Last pap smear/pelvic exam: Pap smear 2016 by patient report, there is no documentation in epic DEXA: Will order, no baseline noted in chart  Results for orders placed during the hospital encounter of 12/18/13  MM DIGITAL SCREENING BILATERAL   Narrative CLINICAL DATA:   Screening.  EXAM: DIGITAL SCREENING BILATERAL MAMMOGRAM WITH CAD  COMPARISON:  No comparisons available.  ACR Breast Density Category b: There are scattered areas of fibroglandular density.  FINDINGS: There are no findings suspicious for malignancy. Images were processed with CAD.  IMPRESSION: No mammographic evidence of malignancy. A result letter of this screening mammogram will be mailed directly to the patient.  RECOMMENDATION: Screening mammogram in one year. (Code:SM-B-01Y)  BI-RADS CATEGORY  1: Negative.   Electronically Signed   By: Edwin CapJennifer  Jarosz M.D.   On: 12/18/2013 16:29     Prior vaccinations: TD or Tdap: 2016  Influenza: declined  Pneumococcal: Declined Prevnar13:  Declined Shingles/Zostavax: Declined Names of Other Physician/Practitioners you currently use: 1. [facility] here for primary care 2. Does not see one, eye doctor, last visit remotely 3. Dr. Dahlia ClientBrowning, dentist, last visit 2016 Patient Care Team: Lucky CowboyWilliam McKeown, MD as PCP - General (Internal Medicine) Beatrice LecherScott T Weaver, PA-C as Physician Assistant (Cardiology) Rollene RotundaJames Hochrein, MD as Consulting Physician (Cardiology) [facility] as Consulting Physician (  Specialist)  SURGICAL HISTORY Past Surgical History  Procedure Laterality Date  . Knee arthroscopy Right 12-09-2002  . Transthoracic echocardiogram  11-17-2010    MILD LVH/ EF 60-65%/ GRADE I DIASTOLIC DYSFUNCTION/ MILD LAE / MILD RAE  . Total abdominal hysterectomy w/ bilateral salpingoophorectomy  ~ 1986    W/ BILATERAL SALPINGOOPHORECTOMY  . Knee arthroscopy with medial menisectomy Right 10/05/2012    Procedure: KNEE ARTHROSCOPY WITH MEDIAL MENISECTOMY;  Surgeon: Jacki Cones, MD;  Location: [facility] [place];  Service: Orthopedics;  Laterality: Right;  . Ivc filter placed in may 2014 N/A    FAMILY HISTORY Family History  Problem Relation Age of Onset  . Heart attack     . Heart attack Father   . Hypertension Brother   . Diabetes Brother   . Stroke Brother   . Sudden death Brother    SOCIAL HISTORY Social History  Substance Use Topics  . Smoking status: Never Smoker   . Smokeless tobacco: Never Used  . Alcohol Use: No    Allergies  Allergen Reactions  . Hydrocodone     Itch  . Oxycodone Rash  . Tramadol Rash    MEDICARE WELLNESS OBJECTIVES: Tobacco use: She does smoke.  Patient is a former smoker. If yes, counseling given Alcohol Current alcohol use: none Diet: well balanced Physical activity: Current Exercise Habits: Home exercise routine, Type of exercise: walking, Time (Minutes): 10, Frequency (Times/Week): 3, Weekly Exercise (Minutes/Week): 30, Intensity: Mild Cardiac risk factors: Cardiac Risk Factors include: advanced age (>40men, >40 women);dyslipidemia;hypertension;obesity (BMI >30kg/m2) Depression/mood screen:   Depression screen [facility] 2/9 10/22/2015  Decreased Interest 0  Down, Depressed, Hopeless 0  PHQ - 2 Score 0    ADLs:  In your present state of health, do you have any difficulty performing the following activities: 10/22/2015 07/10/2015  Hearing? N N  Vision? N N  Difficulty concentrating or making decisions? N N  Walking or climbing stairs? N N  Dressing or bathing? N N  Doing errands, shopping? N N  Preparing Food and eating ? N -  Using the Toilet? N -  In the past six months, have you accidently leaked urine? N -  Do you have problems with loss of bowel control? N -  Managing your Medications? N -  Managing your Finances? N -  Housekeeping or managing your Housekeeping? N -     Cognitive Testing  Alert? Yes  Normal Appearance?Yes  Oriented to person? Yes  Place? Yes   Time? Yes  Recall of three objects?  Yes  Can perform simple calculations? Yes  Displays appropriate judgment?Yes  Can read the correct time from a watch face?Yes  EOL planning: Does patient have an advance directive?: Yes, No Does patient  want to make changes to advanced directive?: Yes - information given Would patient like information on creating an advanced directive?: Yes - Educational materials given  Review of Systems  Constitutional: Negative for fever, chills and malaise/fatigue.  HENT: Negative for congestion, ear pain and sore throat.   Eyes: Negative.   Respiratory: Positive for shortness of breath. Negative for cough, sputum production and wheezing.        Witnessed apneas reported by friend  Cardiovascular: Negative for chest pain, palpitations and leg swelling.  Gastrointestinal: Negative for heartburn, abdominal pain, diarrhea, constipation, blood in stool and melena.  Genitourinary: Negative.   Skin: Negative.   Neurological: Negative for dizziness, sensory change, loss of consciousness and headaches.  Psychiatric/Behavioral: Negative for depression. The patient  is not nervous/anxious and does not have insomnia.      Objective:     Today's Vitals   10/22/15 1515  BP: 182/104  Pulse: 98  Temp: 98 F (36.7 C)  TempSrc: Temporal  Resp: 20  Height: 5' 2.5" (1.588 m)  Weight: 318 lb (144.244 kg)   Body mass index is 57.2 kg/(m^2).  General appearance: alert, no distress, WD/WN, female HEENT: normocephalic, sclerae anicteric, TMs pearly, nares patent, no discharge or erythema, pharynx normal Oral cavity: MMM, no lesions Neck: supple, no lymphadenopathy, no thyromegaly, no masses Heart: RRR, normal S1, S2, no murmurs Lungs: CTA bilaterally, no wheezes, rhonchi, or rales Abdomen: +bs, soft, non tender, non distended, no masses, no hepatomegaly, no splenomegaly Musculoskeletal: nontender, no swelling, no obvious deformity Extremities: no edema, no cyanosis, no clubbing Pulses: 2+ symmetric, upper and lower extremities, normal cap refill Neurological: alert, oriented x 3, CN2-12 intact, strength normal upper extremities and lower extremities, sensation normal throughout, DTRs 2+ throughout, no  cerebellar signs, gait normal Psychiatric: normal affect, behavior normal, pleasant   Medicare Attestation I have personally reviewed: The patient's medical and social history Their use of alcohol, tobacco or illicit drugs Their current medications and supplements The patient's functional ability including ADLs,fall risks, home safety risks, cognitive, and hearing and visual impairment Diet and physical activities Evidence for depression or mood disorders  The patient's weight, height, BMI, and visual acuity have been recorded in the chart.  I have made referrals, counseling, and provided education to the patient based on review of the above and I have provided the patient with a written personalized care plan for preventive services.     Terri Piedra, PA-C   10/22/2015

## 2015-10-22 NOTE — Patient Instructions (Signed)
[facility] Address: [address], [place], [place] [address] Phone: [phone] Call this number to get your mammogram set up.  Katrina will get your bone density scan set up.  Use the inhaler once daily to help with some of the shortness of breath.  Make sure you rinse your mouth out with water.    Start the new blood pressure medication.  It will be one tablet per day.

## 2015-10-23 LAB — URINALYSIS, ROUTINE W REFLEX MICROSCOPIC
Bilirubin Urine: NEGATIVE
Glucose, UA: NEGATIVE
Hgb urine dipstick: NEGATIVE
KETONES UR: NEGATIVE
LEUKOCYTES UA: NEGATIVE
NITRITE: NEGATIVE
Protein, ur: NEGATIVE
SPECIFIC GRAVITY, URINE: 1.023 (ref 1.001–1.035)
pH: 7.5 (ref 5.0–8.0)

## 2015-10-23 LAB — TSH: TSH: 1.48 m[IU]/L

## 2015-10-23 LAB — HEMOGLOBIN A1C
HEMOGLOBIN A1C: 5.8 % — AB (ref <5.7)
Mean Plasma Glucose: 120 mg/dL

## 2015-10-23 LAB — MICROALBUMIN / CREATININE URINE RATIO
CREATININE, URINE: 174 mg/dL (ref 20–320)
MICROALB UR: 0.4 mg/dL
MICROALB/CREAT RATIO: 2 ug/mg{creat} (ref <30)

## 2015-10-23 LAB — VITAMIN D 25 HYDROXY (VIT D DEFICIENCY, FRACTURES): Vit D, 25-Hydroxy: 12 ng/mL — ABNORMAL LOW (ref 30–100)

## 2015-10-23 LAB — INSULIN, RANDOM: Insulin: 17.3 u[IU]/mL (ref 2.0–19.6)

## 2015-11-01 ENCOUNTER — Other Ambulatory Visit: Payer: Self-pay | Admitting: Internal Medicine

## 2015-11-05 ENCOUNTER — Ambulatory Visit: Payer: Self-pay | Admitting: Internal Medicine

## 2015-11-10 ENCOUNTER — Ambulatory Visit: Payer: Self-pay | Admitting: Internal Medicine

## 2015-11-25 ENCOUNTER — Ambulatory Visit (INDEPENDENT_AMBULATORY_CARE_PROVIDER_SITE_OTHER): Payer: Medicare Other | Admitting: Internal Medicine

## 2015-11-25 ENCOUNTER — Encounter: Payer: Self-pay | Admitting: Internal Medicine

## 2015-11-25 VITALS — BP 132/60 | HR 64 | Temp 98.0°F | Resp 20 | Ht 62.5 in | Wt 320.0 lb

## 2015-11-25 DIAGNOSIS — Z79899 Other long term (current) drug therapy: Secondary | ICD-10-CM | POA: Diagnosis not present

## 2015-11-25 DIAGNOSIS — E559 Vitamin D deficiency, unspecified: Secondary | ICD-10-CM

## 2015-11-25 DIAGNOSIS — I10 Essential (primary) hypertension: Secondary | ICD-10-CM | POA: Diagnosis not present

## 2015-11-25 DIAGNOSIS — E782 Mixed hyperlipidemia: Secondary | ICD-10-CM

## 2015-11-25 DIAGNOSIS — R7303 Prediabetes: Secondary | ICD-10-CM

## 2015-11-25 DIAGNOSIS — R7309 Other abnormal glucose: Secondary | ICD-10-CM | POA: Diagnosis not present

## 2015-11-25 LAB — HEPATIC FUNCTION PANEL
ALBUMIN: 3.6 g/dL (ref 3.6–5.1)
ALK PHOS: 118 U/L (ref 33–130)
ALT: 47 U/L — ABNORMAL HIGH (ref 6–29)
AST: 31 U/L (ref 10–35)
BILIRUBIN DIRECT: 0.2 mg/dL (ref <=0.2)
BILIRUBIN INDIRECT: 0.5 mg/dL (ref 0.2–1.2)
BILIRUBIN TOTAL: 0.7 mg/dL (ref 0.2–1.2)
Total Protein: 6.2 g/dL (ref 6.1–8.1)

## 2015-11-25 LAB — CBC WITH DIFFERENTIAL/PLATELET
BASOS ABS: 59 {cells}/uL (ref 0–200)
Basophils Relative: 1 %
EOS ABS: 236 {cells}/uL (ref 15–500)
EOS PCT: 4 %
HCT: 40.1 % (ref 35.0–45.0)
Hemoglobin: 13.5 g/dL (ref 11.7–15.5)
LYMPHS PCT: 23 %
Lymphs Abs: 1357 cells/uL (ref 850–3900)
MCH: 30.1 pg (ref 27.0–33.0)
MCHC: 33.7 g/dL (ref 32.0–36.0)
MCV: 89.3 fL (ref 80.0–100.0)
MONOS PCT: 12 %
MPV: 10.5 fL (ref 7.5–12.5)
Monocytes Absolute: 708 cells/uL (ref 200–950)
NEUTROS ABS: 3540 {cells}/uL (ref 1500–7800)
Neutrophils Relative %: 60 %
PLATELETS: 249 10*3/uL (ref 140–400)
RBC: 4.49 MIL/uL (ref 3.80–5.10)
RDW: 12.6 % (ref 11.0–15.0)
WBC: 5.9 10*3/uL (ref 3.8–10.8)

## 2015-11-25 LAB — LIPID PANEL
CHOL/HDL RATIO: 2.6 ratio (ref <=5.0)
Cholesterol: 136 mg/dL (ref 125–200)
HDL: 52 mg/dL (ref >=46)
LDL Cholesterol: 77 mg/dL (ref <130)
Triglycerides: 36 mg/dL (ref <150)
VLDL: 7 mg/dL (ref <30)

## 2015-11-25 LAB — BASIC METABOLIC PANEL WITH GFR
BUN: 29 mg/dL — AB (ref 7–25)
CALCIUM: 9.3 mg/dL (ref 8.6–10.4)
CO2: 29 mmol/L (ref 20–31)
CREATININE: 0.91 mg/dL (ref 0.50–0.99)
Chloride: 105 mmol/L (ref 98–110)
GFR, EST AFRICAN AMERICAN: 78 mL/min (ref >=60)
GFR, Est Non African American: 68 mL/min (ref >=60)
Glucose, Bld: 158 mg/dL — ABNORMAL HIGH (ref 65–99)
Potassium: 4.1 mmol/L (ref 3.5–5.3)
SODIUM: 144 mmol/L (ref 135–146)

## 2015-11-25 LAB — HEMOGLOBIN A1C
HEMOGLOBIN A1C: 6.2 % — AB (ref <5.7)
MEAN PLASMA GLUCOSE: 131 mg/dL

## 2015-11-25 LAB — TSH: TSH: 1.81 mIU/L

## 2015-11-25 NOTE — Progress Notes (Signed)
Assessment and Plan:  Hypertension:  -Continue medication,  -monitor blood pressure at home.  -Continue DASH diet.   -Reminder to go to the ER if any CP, SOB, nausea, dizziness, severe HA, changes vision/speech, left arm numbness and tingling, and jaw pain.  Cholesterol: -Continue diet and exercise.  -Check cholesterol.   Pre-diabetes: -Continue diet and exercise.  -Check A1C  Vitamin D Def: -check level -continue medications.   Snoring with likely OSA -doing apnea link tonight or tomorrow night  Continue diet and meds as discussed. Further disposition pending results of labs.  HPI 65 y.o. female  presents for 3 month follow up with hypertension, hyperlipidemia, prediabetes and vitamin D.   Her blood pressure has been controlled at home, today their BP is BP: 132/60 mmHg.   She does not workout. She denies chest pain, shortness of breath, dizziness.   She is on cholesterol medication and denies myalgias. Her cholesterol is at goal. The cholesterol last visit was:   Lab Results  Component Value Date   CHOL 203* 10/22/2015   HDL 56 10/22/2015   LDLCALC 136* 10/22/2015   TRIG 53 10/22/2015   CHOLHDL 3.6 10/22/2015     She has been working on diet and exercise for prediabetes, and denies foot ulcerations, hyperglycemia, hypoglycemia , increased appetite, nausea, paresthesia of the feet, polydipsia, polyuria, visual disturbances, vomiting and weight loss. Last A1C in the office was:  Lab Results  Component Value Date   HGBA1C 5.8* 10/22/2015    Patient is on Vitamin D supplement.  Lab Results  Component Value Date   VD25OH 12* 10/22/2015     She notes that she is not having as many troubles with her knees and back.  She does take aleve 2 tablets.  She notes that she has been following with [facility].  She is doing her apnea link either tonight or tomorrow.    Current Medications:  Current Outpatient Prescriptions on File Prior to Visit  Medication Sig Dispense  Refill  . acetaminophen (TYLENOL) 500 MG tablet Take 500 mg by mouth every 6 (six) hours as needed for pain.    Marland Kitchen amLODipine (NORVASC) 5 MG tablet Take 1 tablet (5 mg total) by mouth daily. 30 tablet 11  . aspirin EC 81 MG tablet Take 81 mg by mouth daily.    Marland Kitchen atorvastatin (LIPITOR) 80 MG tablet Take 1/2 to 1 tablet daily or as directed for Cholesterol 30 tablet 11  . Cholecalciferol (VITAMIN D3) 5000 UNITS CAPS Take 1 capsule by mouth daily.    Marland Kitchen losartan-hydrochlorothiazide (HYZAAR) 100-25 MG tablet TAKE ONE TABLET BY MOUTH ONCE DAILY 90 tablet 1  . Magnesium Oxide 250 MG TABS Take 1 tablet by mouth daily.     . metoprolol (LOPRESSOR) 50 MG tablet TAKE ONE TABLET BY MOUTH TWICE DAILY 180 tablet 1  . mometasone (NASONEX) 50 MCG/ACT nasal spray Place 2 sprays into the nose daily. 17 g 2  . PROAIR HFA 108 (90 Base) MCG/ACT inhaler      No current facility-administered medications on file prior to visit.    Medical History:  Past Medical History  Diagnosis Date  . Acute meniscal tear of knee     RIGHT  . History of DVT (deep vein thrombosis)     IN PREGNANCY  . RBBB (right bundle branch block with left anterior fascicular block)   . Sinus tachycardia (HCC)   . Pulmonary embolism (HCC)     "3 today" (11/21/2012)  . Exertional shortness  of breath     "this week" (11/21/2012)  . Mixed hyperlipidemia   . HTN (hypertension)     CARDIOLOGIST--  DR HOCHREIN-- CLEARANCE NOTE IN EPIC AND CHART FROM 08-21-2012  . Seasonal allergies   . Arthritis     "right knee" (11/21/2012)  . Elevated hemoglobin A1c   . Obesity   . Vitamin D deficiency     Allergies:  Allergies  Allergen Reactions  . Hydrocodone     Itch  . Oxycodone Rash  . Tramadol Rash     Review of Systems:  Review of Systems  Constitutional: Negative for fever, chills and malaise/fatigue.  HENT: Negative for congestion, ear pain and sore throat.   Respiratory: Negative for cough, shortness of breath and wheezing.    Cardiovascular: Negative for chest pain, palpitations and leg swelling.  Gastrointestinal: Positive for constipation. Negative for heartburn, diarrhea, blood in stool and melena.  Genitourinary: Negative.   Neurological: Negative for dizziness, sensory change, loss of consciousness and headaches.  Psychiatric/Behavioral: Negative for depression. The patient is not nervous/anxious and does not have insomnia.     Family history- Review and unchanged  Social history- Review and unchanged  Physical Exam: BP 132/60 mmHg  Pulse 64  Temp(Src) 98 F (36.7 C) (Temporal)  Resp 20  Ht 5' 2.5" (1.588 m)  Wt 320 lb (145.151 kg)  BMI 57.56 kg/m2 Wt Readings from Last 3 Encounters:  11/25/15 320 lb (145.151 kg)  10/22/15 318 lb (144.244 kg)  07/10/15 321 lb (145.605 kg)    General Appearance: Well nourished well developed, Morbidly obese in no apparent distress. Eyes: PERRLA, EOMs, conjunctiva no swelling or erythema ENT/Mouth: Ear canals normal without obstruction, swelling, erythma, discharge.  TMs normal bilaterally.  Oropharynx moist, clear, without exudate, or postoropharyngeal swelling. Neck: Supple, thyroid normal,no cervical adenopathy  Respiratory: Respiratory effort normal, Breath sounds clear A&P without rhonchi, wheeze, or rale.  No retractions, no accessory usage. Cardio: RRR with no MRGs. Brisk peripheral pulses without edema.  Abdomen: Soft, + BS,  Non tender, no guarding, rebound, hernias, masses. Musculoskeletal: Full ROM, 5/5 strength, Normal gait Skin: Warm, dry without rashes, lesions, ecchymosis.  Neuro: Awake and oriented X 3, Cranial nerves intact. Normal muscle tone, no cerebellar symptoms. Psych: Normal affect, Insight and Judgment appropriate.    Terri Piedraourtney Forcucci, PA-C 11:55 AM [facility]

## 2015-12-16 ENCOUNTER — Other Ambulatory Visit: Payer: Self-pay | Admitting: Internal Medicine

## 2015-12-16 DIAGNOSIS — G4730 Sleep apnea, unspecified: Secondary | ICD-10-CM

## 2015-12-22 ENCOUNTER — Ambulatory Visit
Admission: RE | Admit: 2015-12-22 | Discharge: 2015-12-22 | Disposition: A | Discharge status: Home / Self Care | Payer: Medicare Other | Source: Ambulatory Visit | Attending: Internal Medicine | Admitting: Internal Medicine

## 2015-12-22 DIAGNOSIS — M85852 Other specified disorders of bone density and structure, left thigh: Secondary | ICD-10-CM | POA: Diagnosis not present

## 2015-12-22 DIAGNOSIS — Z780 Asymptomatic menopausal state: Secondary | ICD-10-CM | POA: Diagnosis not present

## 2015-12-22 DIAGNOSIS — E2839 Other primary ovarian failure: Secondary | ICD-10-CM

## 2016-01-11 ENCOUNTER — Ambulatory Visit (INDEPENDENT_AMBULATORY_CARE_PROVIDER_SITE_OTHER): Payer: Medicare Other | Admitting: Neurology

## 2016-01-11 ENCOUNTER — Encounter: Payer: Self-pay | Admitting: Neurology

## 2016-01-11 VITALS — BP 140/88 | HR 96 | Resp 20 | Ht 62.0 in | Wt 325.0 lb

## 2016-01-11 DIAGNOSIS — E662 Morbid (severe) obesity with alveolar hypoventilation: Secondary | ICD-10-CM

## 2016-01-11 DIAGNOSIS — R51 Headache: Secondary | ICD-10-CM

## 2016-01-11 DIAGNOSIS — R0683 Snoring: Secondary | ICD-10-CM

## 2016-01-11 DIAGNOSIS — T732XXA Exhaustion due to exposure, initial encounter: Secondary | ICD-10-CM

## 2016-01-11 DIAGNOSIS — R519 Headache, unspecified: Secondary | ICD-10-CM

## 2016-01-11 DIAGNOSIS — G4719 Other hypersomnia: Secondary | ICD-10-CM

## 2016-01-11 NOTE — Progress Notes (Signed)
SLEEP MEDICINE CLINIC   Provider:  Melvyn Novas, M D  Referring Provider: Lucky Cowboy, MD Primary Care Physician:  Nadean Corwin, MD  Chief Complaint  Patient presents with  . New Patient (Initial Visit)    "loses breath" at night, never had sleep study    HPI:  Kerry Liu is a 65 y.o. female , seen here as a referral  from Dr. Oneta Rack for a sleep consultation.   Chief complaint according to patient : " I don't sleep good at night, I quit breathing and I wake tired"   Kerry Liu reports that she has been struggling to lose weight, she has some back pain and she has seen the specialist according to her complaint. And she was with her primary care physician she had mentioned that she feels very fatigued and actually excessively sleepy and then also mentions that it friend of hers had witnessed her to snore and stop breathing at night. She has an elevated body mass index and also reports she feels that she doesn't sleep ever enough to be refreshed and restored. She does not have insomnia there is no problem to fall asleep and stay asleep. Late May when she saw her primary care office she was given some dietary and exercise advice. And a home sleep test with apnea link was ordered. She states that she went through that home sleep test and was positive for apnea. She is borderline elevated with her hemoglobin A1c at 5.8, her total cholesterol is 203 which is not alarming. Her blood pressure had been 132/60.mmHg. she is currently looking into exercise clubs in her neighborhood.   Sleep habits are as follows: She goes to bed around 11 PM and usually is asleep within 30 minutes. Before she goes to bed she usually watches TV, she reports her bedroom is dark and quiet but not cool she does not have air conditioning. She lives with her grandson who has a cat but the [place] usually in her grandson's room. She does have some environmental allergies and possibly to cat hair  ,too. She does not have fragmented sleep I nocturia usually she goes to the bathroom before she goes to bed and right in the morning. She prefers to sleep on her side but sometimes rolls to her back, he sleeps on 2 pillows. She uses a flat bad with a non-adjustable head rest, she does not recall dreaming a lot. She certainly does not have a lot of nightmares, has not sleepwalked and she has no history of any seizure activity or complex behaviors of sleep. She wakes up spontaneously in the morning does not rely on an alarm. She is usually not refreshed or restored but would like to sleep little longer. Since she is now retired she doesn't have to get up at a certain early time, and her grandson is adult and doesn't need her help in the morning.   Sleep medical history and family sleep history:   Social history:  Worked at [employer], retired at age 36, she is a lifelong nontobacco user has never smoked. She has never been drinking alcohol, she has been using caffeine rather heavily, but now is only drinking 2 drinks/ days. She is divorced and lives alone except for her adult grandson that she has a house with her.   Review of Systems: Out of a complete 14 system review, the patient complains of only the following symptoms, and all other reviewed systems are negative. Snoring, witnessed apnea,  fatigue and tiredness during the day excessive descent sleepiness.  How likely are you to doze in the following situations: 0 = not likely, 1 = slight chance, 2 = moderate chance, 3 = high chance  Sitting and Reading?2 Watching Television?2 Sitting inactive in a public place (theater or meeting)?1 Lying down in the afternoon when circumstances permit?2 Sitting and talking to someone?2 Sitting quietly after lunch without alcohol?2 In a car, while stopped for a few minutes in traffic?2 As a passenger in a car for an hour without a break?3  Total = 16  , Fatigue severity score 54 ,  depression score 2/15    Social History   Social History  . Marital Status: Single    Spouse Name: N/A  . Number of Children: N/A  . Years of Education: N/A   Occupational History  . housekeeper [employer]   Social History Main Topics  . Smoking status: Never Smoker   . Smokeless tobacco: Never Used  . Alcohol Use: No  . Drug Use: No  . Sexual Activity: No   Other Topics Concern  . Not on file   Social History Narrative    Family History  Problem Relation Age of Onset  . Heart attack    . Heart attack Father   . Hypertension Brother   . Diabetes Brother   . Stroke Brother   . Sudden death Brother     Past Medical History  Diagnosis Date  . Acute meniscal tear of knee     RIGHT  . History of DVT (deep vein thrombosis)     IN PREGNANCY  . RBBB (right bundle branch block with left anterior fascicular block)   . Sinus tachycardia (HCC)   . Pulmonary embolism (HCC)     "3 today" (11/21/2012)  . Exertional shortness of breath     "this week" (11/21/2012)  . Mixed hyperlipidemia   . HTN (hypertension)     CARDIOLOGIST--  DR HOCHREIN-- CLEARANCE NOTE IN EPIC AND CHART FROM 08-21-2012  . Seasonal allergies   . Arthritis     "right knee" (11/21/2012)  . Elevated hemoglobin A1c   . Obesity   . Vitamin D deficiency     Past Surgical History  Procedure Laterality Date  . Knee arthroscopy Right 12-09-2002  . Transthoracic echocardiogram  11-17-2010    MILD LVH/ EF 60-65%/ GRADE I DIASTOLIC DYSFUNCTION/ MILD LAE / MILD RAE  . Total abdominal hysterectomy w/ bilateral salpingoophorectomy  ~ 1986    W/ BILATERAL SALPINGOOPHORECTOMY  . Knee arthroscopy with medial menisectomy Right 10/05/2012    Procedure: KNEE ARTHROSCOPY WITH MEDIAL MENISECTOMY;  Surgeon: Jacki Cones, MD;  Location: [facility];  Service: Orthopedics;  Laterality: Right;  . Ivc filter placed in may 2014 N/A     Current Outpatient Prescriptions  Medication Sig Dispense Refill  .  acetaminophen (TYLENOL) 500 MG tablet Take 500 mg by mouth every 6 (six) hours as needed for pain.    Marland Kitchen amLODipine (NORVASC) 5 MG tablet Take 1 tablet (5 mg total) by mouth daily. 30 tablet 11  . aspirin EC 81 MG tablet Take 81 mg by mouth daily.    Marland Kitchen atorvastatin (LIPITOR) 80 MG tablet Take 1/2 to 1 tablet daily or as directed for Cholesterol 30 tablet 11  . Cholecalciferol (VITAMIN D3) 5000 UNITS CAPS Take 1 capsule by mouth daily.    Marland Kitchen losartan-hydrochlorothiazide (HYZAAR) 100-25 MG tablet TAKE ONE TABLET BY MOUTH ONCE DAILY 90 tablet 1  .  Magnesium Oxide 250 MG TABS Take 1 tablet by mouth daily.     . metoprolol (LOPRESSOR) 50 MG tablet TAKE ONE TABLET BY MOUTH TWICE DAILY 180 tablet 1  . PROAIR HFA 108 (90 Base) MCG/ACT inhaler     . mometasone (NASONEX) 50 MCG/ACT nasal spray Place 2 sprays into the nose daily. 17 g 2   No current facility-administered medications for this visit.    Allergies as of 01/11/2016 - Review Complete 01/11/2016  Allergen Reaction Noted  . Hydrocodone  05/29/2013  . Oxycodone Rash 08/16/2012  . Tramadol Rash 10/01/2012    Vitals: BP 140/88 mmHg  Pulse 96  Resp 20  Ht 5\' 2"  (1.575 m)  Wt 325 lb (147.419 kg)  BMI 59.43 kg/m2 Last Weight:  Wt Readings from Last 1 Encounters:  01/11/16 325 lb (147.419 kg)   ZOX:WRUEBMI:Body mass index is 59.43 kg/(m^2).     Last Height:   Ht Readings from Last 1 Encounters:  01/11/16 5\' 2"  (1.575 m)    Physical exam:  General: The patient is awake, alert and appears not in acute distress. The patient is well groomed. Head: Normocephalic, atraumatic. Neck is supple. Mallampati 4  neck circumference:17. Nasal airflow congested , TMJ is  evident . Retrognathia is seen. Poor dental status with prognathia and gap  Cardiovascular:  Regular rate and rhythm, without  murmurs or carotid bruit, and without distended neck veins. Respiratory: Lungs are clear to auscultation. Skin:  Without evidence of edema, or rash Trunk: BMI is  elevated . The patient's posture is erect    Neurologic exam : The patient is awake and alert, oriented to place and time.   Memory subjective described as intact. Attention span & concentration ability appears normal.  Speech is fluent,  without dysarthria, dysphonia or aphasia.  Mood and affect are appropriate.  Cranial nerves: Pupils are equal and briskly reactive to light. Extraocular movements  in vertical and horizontal planes intact and without nystagmus. Visual fields by finger perimetry are intact. Hearing to finger rub intact.   Facial sensation intact to fine touch.  Facial motor strength is symmetric and tongue and uvula move midline. Shoulder shrug was symmetrical.   Motor exam:   Normal tone, muscle bulk and symmetric strength in all extremities.  Sensory:  Fine touch, pinprick and vibration were tested in all extremities.  Proprioception tested in the upper extremities was normal.  Coordination: Rapid alternating movements in the fingers/hands was normal.  Finger-to-nose maneuver  normal without evidence of ataxia, dysmetria or tremor.  Gait and station: Patient walks without assistive device and is able unassisted to climb up to the exam table. Strength within normal limits.  Stance is stable and normal.   Deep tendon reflexes: in the  upper and lower extremities are symmetric and intact.  The patient was advised of the nature of the diagnosed sleep disorder , the treatment options and risks for general a health and wellness arising from not treating the condition.  I spent more than 45 minutes of face to face time with the patient. Greater than 50% of time was spent in counseling and coordination of care. We have discussed the diagnosis and differential and I answered the patient's questions.     Assessment:  After physical and neurologic examination, review of laboratory studies,  Personal review of imaging studies, reports of other /same  Imaging studies ,  Results of  polysomnography/ neurophysiology testing and pre-existing records as far as provided in visit., my assessment is  1) Kerry Liu has several risk factors including a very high body mass index, a narrow upper airway, macroglossia, exaggerated gag reflex, and a large neck circumference. This in conjunction with the clinical observation that she snores and has apnea makes the likelihood of obstructive sleep apnea being present very high. Apparently she had done an apnea link as a home sleep test which was also positive, but I don't know the results exactly. I would like for her to return to the sleep lab here stay overnight and titrated in a split-night polysomnography. I will need only 1 hour baseline if the patient has significant apnea. CPAP is usually used to treat apnea with more than 10 AHI per hour of sleep, and is always used to treat apnea when associated with hypoxemia or hypercapnia.  This patient is at high risk of obesity hypoventilation and I would like for capnography to be part of her baseline sleep study.  Plan:  Treatment plan and additional workup :Split , watch for hypoxemia, hypercapnia and AHI.   Kerry Mylar Nikai Quest MD  01/11/2016   CC: Lucky Cowboy, Md [address] [place], [place] [address]

## 2016-01-20 ENCOUNTER — Ambulatory Visit (INDEPENDENT_AMBULATORY_CARE_PROVIDER_SITE_OTHER): Payer: Medicare Other | Admitting: Neurology

## 2016-01-20 DIAGNOSIS — T732XXA Exhaustion due to exposure, initial encounter: Secondary | ICD-10-CM

## 2016-01-20 DIAGNOSIS — G4719 Other hypersomnia: Secondary | ICD-10-CM

## 2016-01-20 DIAGNOSIS — G4710 Hypersomnia, unspecified: Secondary | ICD-10-CM

## 2016-01-20 DIAGNOSIS — R0683 Snoring: Secondary | ICD-10-CM

## 2016-01-20 DIAGNOSIS — R51 Headache: Secondary | ICD-10-CM

## 2016-01-20 DIAGNOSIS — R519 Headache, unspecified: Secondary | ICD-10-CM

## 2016-01-20 DIAGNOSIS — E662 Morbid (severe) obesity with alveolar hypoventilation: Secondary | ICD-10-CM

## 2016-02-02 ENCOUNTER — Telehealth: Payer: Self-pay

## 2016-02-02 DIAGNOSIS — G4733 Obstructive sleep apnea (adult) (pediatric): Secondary | ICD-10-CM

## 2016-02-02 NOTE — Telephone Encounter (Signed)
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 PAP therapy is indicated. Dr. Vickey Hugerohmeier recommends proceeding with an in lab titration study to optimize therapy. Pt is agreeable to this. I advised her that alternative therapies, such as oral appliance or ENT procedure will not address this degree and type of apnea. I advised her that weight loss is most important and that pt should consider having her thyroid function to rule out hypothyroidism. Pt verbalized understanding of results. Pt had no questions at this time but was encouraged to call back if questions arise. Pt knows that our sleep lab will call her to set up her cpap titration.

## 2016-02-14 ENCOUNTER — Ambulatory Visit (INDEPENDENT_AMBULATORY_CARE_PROVIDER_SITE_OTHER): Payer: Medicare Other | Admitting: Neurology

## 2016-02-14 DIAGNOSIS — G4733 Obstructive sleep apnea (adult) (pediatric): Secondary | ICD-10-CM | POA: Diagnosis not present

## 2016-02-23 ENCOUNTER — Telehealth: Payer: Self-pay

## 2016-02-23 DIAGNOSIS — G4733 Obstructive sleep apnea (adult) (pediatric): Secondary | ICD-10-CM

## 2016-02-23 NOTE — Telephone Encounter (Signed)
I spoke to pt. I advised her that her sleep study showed improvement of her apnea and less respiratory events during titration and Dr. Vickey Hugerohmeier recommends starting a cpap. Pt is agreeable to this, and reports that she felt better after using the cpap. I advised pt that I would send the order to [facility] and they would call her within a week to get the cpap appt scheduled. I advised pt to avoid caffeine containing beverages and chocolate. Pt verbalized understanding of results. Pt had no questions at this time but was encouraged to call back if questions arise.

## 2016-03-01 ENCOUNTER — Ambulatory Visit (INDEPENDENT_AMBULATORY_CARE_PROVIDER_SITE_OTHER): Payer: Medicare Other | Admitting: Internal Medicine

## 2016-03-01 ENCOUNTER — Encounter: Payer: Self-pay | Admitting: Internal Medicine

## 2016-03-01 VITALS — BP 136/84 | HR 64 | Temp 97.5°F | Resp 16 | Ht 62.0 in | Wt 328.6 lb

## 2016-03-01 DIAGNOSIS — I10 Essential (primary) hypertension: Secondary | ICD-10-CM

## 2016-03-01 DIAGNOSIS — E782 Mixed hyperlipidemia: Secondary | ICD-10-CM

## 2016-03-01 DIAGNOSIS — E559 Vitamin D deficiency, unspecified: Secondary | ICD-10-CM | POA: Diagnosis not present

## 2016-03-01 DIAGNOSIS — R7303 Prediabetes: Secondary | ICD-10-CM | POA: Diagnosis not present

## 2016-03-01 DIAGNOSIS — Z79899 Other long term (current) drug therapy: Secondary | ICD-10-CM | POA: Diagnosis not present

## 2016-03-01 LAB — BASIC METABOLIC PANEL WITH GFR
BUN: 22 mg/dL (ref 7–25)
CALCIUM: 9.1 mg/dL (ref 8.6–10.4)
CO2: 26 mmol/L (ref 20–31)
CREATININE: 1 mg/dL — AB (ref 0.50–0.99)
Chloride: 105 mmol/L (ref 98–110)
GFR, Est African American: 69 mL/min (ref >=60)
GFR, Est Non African American: 60 mL/min (ref >=60)
Glucose, Bld: 154 mg/dL — ABNORMAL HIGH (ref 65–99)
Potassium: 4.3 mmol/L (ref 3.5–5.3)
SODIUM: 143 mmol/L (ref 135–146)

## 2016-03-01 LAB — CBC WITH DIFFERENTIAL/PLATELET
BASOS ABS: 57 {cells}/uL (ref 0–200)
Basophils Relative: 1 %
EOS PCT: 3 %
Eosinophils Absolute: 171 cells/uL (ref 15–500)
HCT: 39.7 % (ref 35.0–45.0)
HEMOGLOBIN: 12.6 g/dL (ref 11.7–15.5)
LYMPHS ABS: 1083 {cells}/uL (ref 850–3900)
Lymphocytes Relative: 19 %
MCH: 28.6 pg (ref 27.0–33.0)
MCHC: 31.7 g/dL — AB (ref 32.0–36.0)
MCV: 90 fL (ref 80.0–100.0)
MONOS PCT: 11 %
MPV: 10.2 fL (ref 7.5–12.5)
Monocytes Absolute: 627 cells/uL (ref 200–950)
Neutro Abs: 3762 cells/uL (ref 1500–7800)
Neutrophils Relative %: 66 %
Platelets: 254 10*3/uL (ref 140–400)
RBC: 4.41 MIL/uL (ref 3.80–5.10)
RDW: 12.7 % (ref 11.0–15.0)
WBC: 5.7 10*3/uL (ref 3.8–10.8)

## 2016-03-01 LAB — LIPID PANEL
CHOLESTEROL: 153 mg/dL (ref 125–200)
HDL: 54 mg/dL (ref >=46)
LDL CALC: 90 mg/dL (ref <130)
TRIGLYCERIDES: 47 mg/dL (ref <150)
Total CHOL/HDL Ratio: 2.8 Ratio (ref <=5.0)
VLDL: 9 mg/dL (ref <30)

## 2016-03-01 LAB — HEPATIC FUNCTION PANEL
ALBUMIN: 3.5 g/dL — AB (ref 3.6–5.1)
ALT: 22 U/L (ref 6–29)
AST: 17 U/L (ref 10–35)
Alkaline Phosphatase: 110 U/L (ref 33–130)
BILIRUBIN DIRECT: 0.2 mg/dL (ref <=0.2)
Indirect Bilirubin: 0.5 mg/dL (ref 0.2–1.2)
TOTAL PROTEIN: 6.4 g/dL (ref 6.1–8.1)
Total Bilirubin: 0.7 mg/dL (ref 0.2–1.2)

## 2016-03-01 LAB — MAGNESIUM: MAGNESIUM: 2 mg/dL (ref 1.5–2.5)

## 2016-03-01 MED ORDER — FUROSEMIDE 40 MG PO TABS: ORAL_TABLET | ORAL | 1 refills | Status: DC

## 2016-03-01 MED ORDER — LOSARTAN POTASSIUM 100 MG PO TABS: ORAL_TABLET | ORAL | 1 refills | Status: DC

## 2016-03-01 NOTE — Progress Notes (Signed)
[facility]                       Lucky Cowboy, M.D.        Dyanne Carrel. Steffanie Dunn, P.A.-C       Terri Piedra, P.A.-C  [facility]                [address]                [place], [place]. [address] Telephone [phone] Telefax [phone] ______________________________________________________________________     This very nice 65y.o.MBF presents for follow up with Hypertension, Hyperlipidemia, Pre-Diabetes and Vitamin D Deficiency. Patient was recently started on CPAP for OSA and already reports improved sleep hygiene and less fatigue.       Patient has hx/o large PE after knee surgery (May 2014) and completed a 11-12 month course of Xarelto transitioning to low dose ASA which she self d/c'd for unclear reasons.      Patient is treated for HTN circa 2009 & BP has been controlled at home. Today's BP: 136/84. Patient has had no complaints of any cardiac type chest pain, palpitations, dyspnea/orthopnea/PND, dizziness, claudication, but recently has developed increasing dependent edema.     Hyperlipidemia is controlled with diet & meds. Patient denies myalgias or other med SE's. Last Lipids were  Lab Results  Component Value Date   CHOL 136 11/25/2015   HDL 52 11/25/2015   LDLCALC 77 11/25/2015   TRIG 36 11/25/2015   CHOLHDL 2.6 11/25/2015      Also, the patient has history of Severe Morbid Obesity (BMI 60+) and consequent PreDiabetes with A1c 5.9% in Sept 2016. and has had no symptoms of reactive hypoglycemia, diabetic polys, paresthesias or visual blurring.  Last A1c was not at goal: Lab Results  Component Value Date   HGBA1C 6.2 (H) 11/25/2015      Further, the patient also has history of Vitamin D Deficiency of "20" in 2013 and even lower at "12" in Sept 2016. She alleges that she supplements vitamin D without any suspected side-effects. Last vitamin D was still extremely low: Lab Results  Component Value  Date   VD25OH 12 (L) 10/22/2015   Current Outpatient Prescriptions on File Prior to Visit  Medication Sig  . acetaminophen (TYLENOL) 500 MG tablet Take 500 mg by mouth every 6 (six) hours as needed for pain.  Marland Kitchen amLODipine (NORVASC) 5 MG tablet Take 1 tablet (5 mg total) by mouth daily.  Marland Kitchen aspirin EC 81 MG tablet Take 81 mg by mouth daily.  Marland Kitchen atorvastatin (LIPITOR) 80 MG tablet Take 1/2 to 1 tablet daily or as directed for Cholesterol  . Cholecalciferol (VITAMIN D3) 5000 UNITS CAPS Take 1 capsule by mouth daily.  Marland Kitchen losartan-hydrochlorothiazide (HYZAAR) 100-25 MG tablet TAKE ONE TABLET BY MOUTH ONCE DAILY  . Magnesium Oxide 250 MG TABS Take 1 tablet by mouth daily.   . metoprolol (LOPRESSOR) 50 MG tablet TAKE ONE TABLET BY MOUTH TWICE DAILY  . PROAIR HFA 108 (90 Base) MCG/ACT inhaler   . mometasone (NASONEX) 50 MCG/ACT nasal spray Place 2 sprays into the nose daily.   No current facility-administered medications on file prior to visit.    Allergies  Allergen Reactions  . Hydrocodone     Itch  . Oxycodone Rash  . Tramadol Rash   PMHx:   Past Medical History:  Diagnosis Date  . Acute meniscal tear of knee  RIGHT  . Arthritis    "right knee" (11/21/2012)  . Elevated hemoglobin A1c   . Exertional shortness of breath    "this week" (11/21/2012)  . History of DVT (deep vein thrombosis)    IN PREGNANCY  . HTN (hypertension)    CARDIOLOGIST--  DR HOCHREIN-- CLEARANCE NOTE IN EPIC AND CHART FROM 08-21-2012  . Mixed hyperlipidemia   . Obesity   . Pulmonary embolism (HCC)    "3 today" (11/21/2012)  . RBBB (right bundle branch block with left anterior fascicular block)   . Seasonal allergies   . Sinus tachycardia (HCC)   . Vitamin D deficiency    Immunization History  Administered Date(s) Administered  . Tdap 04/28/2015   Past Surgical History:  Procedure Laterality Date  . IVC filter placed in May 2014 N/A   . KNEE ARTHROSCOPY Right 12-09-2002  . KNEE ARTHROSCOPY WITH MEDIAL  MENISECTOMY Right 10/05/2012   Procedure: KNEE ARTHROSCOPY WITH MEDIAL MENISECTOMY;  Surgeon: Jacki Conesonald A Gioffre, MD;  [facility];  Service: Orthopedics;  Laterality: Right;  . TOTAL ABDOMINAL HYSTERECTOMY W/ BILATERAL SALPINGOOPHORECTOMY  ~ 1986   W/ BILATERAL SALPINGOOPHORECTOMY  . TRANSTHORACIC ECHOCARDIOGRAM  11-17-2010   MILD LVH/ EF 60-65%/ GRADE I DIASTOLIC DYSFUNCTION/ MILD LAE / MILD RAE   FHx:    Reviewed / unchanged  SHx:    Reviewed / unchanged  Systems Review:  Constitutional: Denies fever, chills, wt changes, headaches, insomnia, fatigue, night sweats, change in appetite. Eyes: Denies redness, blurred vision, diplopia, discharge, itchy, watery eyes.  ENT: Denies discharge, congestion, post nasal drip, epistaxis, sore throat, earache, hearing loss, dental pain, tinnitus, vertigo, sinus pain, snoring.  CV: Denies chest pain, palpitations, irregular heartbeat, syncope, dyspnea, diaphoresis, orthopnea, PND, claudication or edema. Respiratory: denies cough, dyspnea, DOE, pleurisy, hoarseness, laryngitis, wheezing.  Gastrointestinal: Denies dysphagia, odynophagia, heartburn, reflux, water brash, abdominal pain or cramps, nausea, vomiting, bloating, diarrhea, constipation, hematemesis, melena, hematochezia  or hemorrhoids. Genitourinary: Denies dysuria, frequency, urgency, nocturia, hesitancy, discharge, hematuria or flank pain. Musculoskeletal: Denies arthralgias, myalgias, stiffness, jt. swelling, pain, limping or strain/sprain.  Skin: Denies pruritus, rash, hives, warts, acne, eczema or change in skin lesion(s). Neuro: No weakness, tremor, incoordination, spasms, paresthesia or pain. Psychiatric: Denies confusion, memory loss or sensory loss. Endo: Denies change in weight, skin or hair change.  Heme/Lymph: No excessive bleeding, bruising or enlarged lymph nodes.  Physical Exam BP 136/84   Pulse 64   Temp 97.5 F (36.4 C)   Resp 16   Ht 5\' 2"  (1.575 m)    Wt (!) 328 lb 9.6 oz (149.1 kg)   BMI 60.10 kg/m   Appears severely morbidly obese nourished and in no distress.  Eyes: PERRLA, EOMs, conjunctiva no swelling or erythema. Sinuses: No frontal/maxillary tenderness ENT/Mouth: EAC's clear, TM's nl w/o erythema, bulging. Nares clear w/o erythema, swelling, exudates. Oropharynx clear without erythema or exudates. Oral hygiene is good. Tongue normal, non obstructing. Hearing intact.  Neck: Supple. Thyroid nl. Car 2+/2+ without bruits, nodes or JVD. Chest: Respirations nl with BS clear & equal w/o rales, rhonchi, wheezing or stridor.  Cor: Heart sounds normal w/ regular rate and rhythm without sig. murmurs, gallops, clicks, or rubs. Peripheral pulses normal and equal  without edema.  Abdomen: Soft & bowel sounds normal. Non-tender w/o guarding, rebound, hernias, masses, or organomegaly.  Lymphatics: Unremarkable.  Musculoskeletal: Full ROM all peripheral extremities, joint stability, 5/5 strength, and normal gait.  Skin: Warm, dry without exposed rashes, lesions or ecchymosis apparent.  Neuro: Cranial nerves intact, reflexes equal bilaterally. Sensory-motor testing grossly intact. Tendon reflexes grossly intact.  Pysch: Alert & oriented x 3.  Insight and judgement nl & appropriate. No ideations.  Assessment and Plan:   1. Essential hypertension  - d/c Hyzaar and switch to Cozaar 100 mg and Lasix 40 mg bid & RTC 2 weeks to recheck - Continue medication, monitor blood pressure at home. Continue DASH diet. Reminder to go to the ER if any CP, SOB, nausea, dizziness, severe HA, changes vision/speech, left arm numbness and tingling and jaw pain. - TSH  2. Mixed hyperlipidemia  - Continue diet/meds, exercise,& lifestyle modifications. Continue monitor periodic cholesterol/liver & renal functions - Lipid panel - TSH  3. Prediabetes  - Continue diet, exercise, lifestyle modifications. Monitor appropriate labs. - Hemoglobin A1c - Insulin,  random  4. Vitamin D deficiency  - Continue supplementation. - VITAMIN D 25 Hydroxy   5. Morbid obesity due to excess calories (HCC)   6. Medication management  - CBC with Differential/Platelet - BASIC METABOLIC PANEL WITH GFR - Hepatic function panel - Magnesium     Encouraged patient to resume bASA 81 mg tab daily. Recommended regular exercise, BP monitoring, weight control, and discussed med and SE's. Recommended labs to assess and monitor clinical status. Further disposition pending results of labs. Over 30 minutes of exam, counseling, chart review was performed

## 2016-03-01 NOTE — Patient Instructions (Addendum)
Stop Hyzaar   (Losartan / Hydrochlorothiazide)   +++++++++++++++++++++++++++++++++++++++++ Recommend Adult Low Dose Aspirin or  coated  Aspirin 81 mg daily  To reduce risk of Colon Cancer 20 %,  Skin Cancer 26 % ,  Melanoma 46%  and  Pancreatic cancer 60% ++++++++++++++++++++++++++++++++++++++++++++++++++++++ Vitamin D goal  is between 70-100.  Please make sure that you are taking your Vitamin D as directed.  It is very important as a natural anti-inflammatory  helping hair, skin, and nails, as well as reducing stroke and heart attack risk.  It helps your bones and helps with mood. It also decreases numerous cancer risks so please take it as directed.  Low Vit D is associated with a 200-300% higher risk for CANCER  and 200-300% higher risk for HEART   ATTACK  &  STROKE.   .....................................Marland Kitchen It is also associated with higher death rate at younger ages,  autoimmune diseases like Rheumatoid arthritis, Lupus, Multiple Sclerosis.    Also many other serious conditions, like depression, Alzheimer's Dementia, infertility, muscle aches, fatigue, fibromyalgia - just to name a few. ++++++++++++++++++++++++++++++++++++++++++++++++ Recommend the book "The END of DIETING" by Dr Monico Hoar  & the book "The END of DIABETES " by Dr Monico Hoar At [email or web address] - get book & Audio CD's    Being diabetic has a  300% increased risk for heart attack, stroke, cancer, and alzheimer- type vascular dementia. It is very important that you work harder with diet by avoiding all foods that are white. Avoid white rice (brown & wild rice is OK), white potatoes (sweetpotatoes in moderation is OK), White bread or wheat bread or anything made out of white flour like bagels, donuts, rolls, buns, biscuits, cakes, pastries, cookies, pizza crust, and pasta (made from white flour & egg whites) - vegetarian pasta or spinach or wheat pasta is OK. Multigrain breads like Arnold's or Pepperidge [employer], or  multigrain sandwich thins or flatbreads.  Diet, exercise and weight loss can reverse and cure diabetes in the early stages.  Diet, exercise and weight loss is very important in the control and prevention of complications of diabetes which affects every system in your body, ie. Brain - dementia/stroke, eyes - glaucoma/blindness, heart - heart attack/heart failure, kidneys - dialysis, stomach - gastric paralysis, intestines - malabsorption, nerves - severe painful neuritis, circulation - gangrene & loss of a leg(s), and finally cancer and Alzheimers.    I recommend avoid fried & greasy foods,  sweets/candy, white rice (brown or wild rice or Quinoa is OK), white potatoes (sweet potatoes are OK) - anything made from white flour - bagels, doughnuts, rolls, buns, biscuits,white and wheat breads, pizza crust and traditional pasta made of white flour & egg white(vegetarian pasta or spinach or wheat pasta is OK).  Multi-grain bread is OK - like multi-grain flat bread or sandwich thins. Avoid alcohol in excess. Exercise is also important.    Eat all the vegetables you want - avoid meat, especially red meat and dairy - especially cheese.  Cheese is the most concentrated form of trans-fats which is the worst thing to clog up our arteries. Veggie cheese is OK which can be found in the fresh produce section at [employer] or [employer] or [employer]  ++++++++++++++++++++++++++++++++++++++++++++++++++ DASH Eating Plan  DASH stands for "Dietary Approaches to Stop Hypertension."   The DASH eating plan is a healthy eating plan that has been shown to reduce high blood pressure (hypertension). Additional health benefits may include reducing the risk of type 2 diabetes mellitus,  heart disease, and stroke. The DASH eating plan may also help with weight loss. WHAT DO I NEED TO KNOW ABOUT THE DASH EATING PLAN? For the DASH eating plan, you will follow these general guidelines:  Choose foods with a percent daily value for  sodium of less than 5% (as listed on the food label).  Use salt-free seasonings or herbs instead of table salt or sea salt.  Check with your health care provider or pharmacist before using salt substitutes.  Eat lower-sodium products, often labeled as "lower sodium" or "no salt added."  Eat fresh foods.  Eat more vegetables, fruits, and low-fat dairy products.  Choose whole grains. Look for the word "whole" as the first word in the ingredient list.  Choose fish   Limit sweets, desserts, sugars, and sugary drinks.  Choose heart-healthy fats.  Eat veggie cheese   Eat more home-cooked food and less restaurant, buffet, and fast food.  Limit fried foods.  Cook foods using methods other than frying.  Limit canned vegetables. If you do use them, rinse them well to decrease the sodium.  When eating at a restaurant, ask that your food be prepared with less salt, or no salt if possible.                      WHAT FOODS CAN I EAT? Read Dr Francis Dowse Fuhrman's books on The End of Dieting & The End of Diabetes  Grains Whole grain or whole wheat bread. Brown rice. Whole grain or whole wheat pasta. Quinoa, bulgur, and whole grain cereals. Low-sodium cereals. Corn or whole wheat flour tortillas. Whole grain cornbread. Whole grain crackers. Low-sodium crackers.  Vegetables Fresh or frozen vegetables (raw, steamed, roasted, or grilled). Low-sodium or reduced-sodium tomato and vegetable juices. Low-sodium or reduced-sodium tomato sauce and paste. Low-sodium or reduced-sodium canned vegetables.   Fruits All fresh, canned (in natural juice), or frozen fruits.  Protein Products  All fish and seafood.  Dried beans, peas, or lentils. Unsalted nuts and seeds. Unsalted canned beans.  Dairy Low-fat dairy products, such as skim or 1% milk, 2% or reduced-fat cheeses, low-fat ricotta or cottage cheese, or plain low-fat yogurt. Low-sodium or reduced-sodium cheeses.  Fats and Oils Tub margarines without  trans fats. Light or reduced-fat mayonnaise and salad dressings (reduced sodium). Avocado. Safflower, olive, or canola oils. Natural peanut or almond butter.  Other Unsalted popcorn and pretzels. The items listed above may not be a complete list of recommended foods or beverages. Contact your dietitian for more options.  +++++++++++++++++++++++++++++++++++++++++++  WHAT FOODS ARE NOT RECOMMENDED? Grains/ White flour or wheat flour White bread. White pasta. White rice. Refined cornbread. Bagels and croissants. Crackers that contain trans fat.  Vegetables  Creamed or fried vegetables. Vegetables in a . Regular canned vegetables. Regular canned tomato sauce and paste. Regular tomato and vegetable juices.  Fruits Dried fruits. Canned fruit in light or heavy syrup. Fruit juice.  Meat and Other Protein Products Meat in general - RED meat & White meat.  Fatty cuts of meat. Ribs, chicken wings, all processed meats as bacon, sausage, bologna, salami, fatback, hot dogs, bratwurst and packaged luncheon meats.  Dairy Whole or 2% milk, cream, half-and-half, and cream cheese. Whole-fat or sweetened yogurt. Full-fat cheeses or blue cheese. Non-dairy creamers and whipped toppings. Processed cheese, cheese spreads, or cheese curds.  Condiments Onion and garlic salt, seasoned salt, table salt, and sea salt. Canned and packaged gravies. Worcestershire sauce. Tartar sauce. Barbecue sauce. Teriyaki sauce. Soy sauce,  including reduced sodium. Steak sauce. Fish sauce. Oyster sauce. Cocktail sauce. Horseradish. Ketchup and mustard. Meat flavorings and tenderizers. Bouillon cubes. Hot sauce. Tabasco sauce. Marinades. Taco seasonings. Relishes.  Fats and Oils Butter, stick margarine, lard, shortening and bacon fat. Coconut, palm kernel, or palm oils. Regular salad dressings.  Pickles and olives. Salted popcorn and pretzels.  The items listed above may not be a complete list of foods and beverages to  avoid.

## 2016-03-02 LAB — INSULIN, RANDOM: INSULIN: 24.3 u[IU]/mL — AB (ref 2.0–19.6)

## 2016-03-02 LAB — HEMOGLOBIN A1C
Hgb A1c MFr Bld: 5.8 % — ABNORMAL HIGH (ref <5.7)
Mean Plasma Glucose: 120 mg/dL

## 2016-03-02 LAB — TSH: TSH: 1.63 m[IU]/L

## 2016-03-02 LAB — VITAMIN D 25 HYDROXY (VIT D DEFICIENCY, FRACTURES): VIT D 25 HYDROXY: 16 ng/mL — AB (ref 30–100)

## 2016-03-14 ENCOUNTER — Ambulatory Visit: Payer: Self-pay | Admitting: Internal Medicine

## 2016-03-18 ENCOUNTER — Ambulatory Visit: Payer: Self-pay | Admitting: Internal Medicine

## 2016-03-21 DIAGNOSIS — L03011 Cellulitis of right finger: Secondary | ICD-10-CM | POA: Diagnosis not present

## 2016-03-30 ENCOUNTER — Encounter: Payer: Self-pay | Admitting: Internal Medicine

## 2016-03-30 ENCOUNTER — Ambulatory Visit (INDEPENDENT_AMBULATORY_CARE_PROVIDER_SITE_OTHER): Payer: Medicare Other | Admitting: Internal Medicine

## 2016-03-30 VITALS — BP 160/84 | HR 60 | Temp 97.3°F | Resp 16 | Ht 62.0 in | Wt 330.2 lb

## 2016-03-30 DIAGNOSIS — I10 Essential (primary) hypertension: Secondary | ICD-10-CM | POA: Diagnosis not present

## 2016-03-30 MED ORDER — ASPIRIN EC 81 MG PO TBEC: 81.0000 mg | DELAYED_RELEASE_TABLET | Freq: Every day | ORAL | 0 refills | Status: DC

## 2016-03-30 MED ORDER — MINOXIDIL 10 MG PO TABS: ORAL_TABLET | ORAL | 1 refills | Status: DC

## 2016-03-30 NOTE — Patient Instructions (Signed)
Stop Amlodipine (Norvasc)   .  .  .  .  .  .  .  .  .  .  .  .  .  .  .  .  Start new Medicine   Minoxidil 10 mg   & take 1/2 tablet daily for BP

## 2016-03-30 NOTE — Progress Notes (Signed)
Subjective:    Patient ID: Kerry Liu, female    DOB: 05/07/1951, 65 y.o.   MRN: [id number]  HPI  Patient returns for 4 week f/u after changing from Hyzaar to Losartan and bid Lasix and also d/c Amlodipine for mgmt of her HTN with worsening venous insufficiency edema. She has lost 8 # over the last month, but BP is worse and confirmed elevated today at 160/64 and rechecked at 170/90. She denies any CP, palpitations or dyspnea.  Medication Sig  . acetaminophen (TYLENOL) 500 MG tablet Take 500 mg by mouth every 6 (six) hours as needed for pain.  Marland Kitchen. atorvastatin (LIPITOR) 80 MG tablet Take 1/2 to 1 tablet daily or as directed for Cholesterol  . Cholecalciferol (VITAMIN D3) 5000 UNITS CAPS Take 1 capsule by mouth daily.  . furosemide (LASIX) 40 MG tablet Take 1 tablet 2 x/ day for BP & Fluid  . losartan (COZAAR) 100 MG tablet Take 1 tablet daily for BP  . Magnesium Oxide 250 MG TABS Take 1 tablet by mouth daily.   . metoprolol (LOPRESSOR) 50 MG tablet TAKE ONE TABLET BY MOUTH TWICE DAILY  . PROAIR HFA 108 (90 Base) MCG/ACT inhaler   . amLODipine (NORVASC) 5 MG tablet Take 1 tablet (5 mg total) by mouth daily.  Marland Kitchen. aspirin EC 81 MG tablet Take 81 mg by mouth daily.  . mometasone (NASONEX) 50 MCG/ACT nasal spray Place 2 sprays into the nose daily.   Allergies  Allergen Reactions  . Hydrocodone     Itch  . Oxycodone Rash  . Tramadol Rash   Past Medical History:  Diagnosis Date  . Acute meniscal tear of knee    RIGHT  . Arthritis    "right knee" (11/21/2012)  . Elevated hemoglobin A1c   . Exertional shortness of breath    "this week" (11/21/2012)  . History of DVT (deep vein thrombosis)    IN PREGNANCY  . HTN (hypertension)    CARDIOLOGIST--  DR HOCHREIN-- CLEARANCE NOTE IN EPIC AND CHART FROM 08-21-2012  . Mixed hyperlipidemia   . Obesity   . Pulmonary embolism (HCC)    "3 today" (11/21/2012)  . RBBB (right bundle branch block with left anterior fascicular block)   . Seasonal  allergies   . Sinus tachycardia   . Vitamin D deficiency    Review of Systems  10 point systems review negative except as above.    Objective:   Physical Exam  BP 160/84 - re-ck'd at 170/90   P 60   T 97.3 F (36.3 C)   R 16   Ht 5\' 2"  (1.575 m)   Wt  330 lb 3.2 oz    BMI 60.39  Morbid Obesity. In no Distress.  HEENT - Eac's patent. TM's Nl. EOM's full. PERRLA. NasoOroPharynx clear. Neck - supple. Nl Thyroid. Carotids 2+ & No bruits, nodes, JVD Chest - Clear equal BS w/o Rales, rhonchi, wheezes. Cor - Nl HS. RRR w/o sig MGR. PP 1(+). No edema. MS- FROM w/o deformities. Muscle power, tone and bulk Nl. Gait Nl. Neuro - No obvious Cr N abnormalities. Sensory, motor and Cerebellar functions appear Nl w/o focal abnormalities. Psyche - Mental status normal & appropriate.  No delusions, ideations or obvious mood abnormalities.    Assessment & Plan:   1. Essential hypertension  - aspirin EC 81 MG tablet; Take 1 tablet (81 mg total) by mouth daily.  Dispense: 1 tablet; Refill: 0 - minoxidil (LONITEN) 10 MG tablet; Take 1/2  to 1 tablet daily as directed for BP  Dispense: 90 tablet; Refill: 1  - Discussed diet, meds & SE's   - ROV 2 weeks

## 2016-04-14 ENCOUNTER — Ambulatory Visit (INDEPENDENT_AMBULATORY_CARE_PROVIDER_SITE_OTHER): Payer: Medicare Other | Admitting: Physician Assistant

## 2016-04-14 ENCOUNTER — Encounter: Payer: Self-pay | Admitting: Physician Assistant

## 2016-04-14 DIAGNOSIS — I10 Essential (primary) hypertension: Secondary | ICD-10-CM

## 2016-04-14 DIAGNOSIS — R0609 Other forms of dyspnea: Secondary | ICD-10-CM

## 2016-04-14 NOTE — Progress Notes (Signed)
Assessment and Plan: Morbid obesity due to excess calories (HCC) ? Contributing to her shortness of breath - long discussion about weight loss, diet, and exercise  Dyspnea on exertion With weight gain get BNP/CXR to rule out fluid over load, may need to get repeat CTA/echo if continuing shortness of breath -     DG Chest 2 View; Future -     Brain natriuretic peptide (16109) -     CBC with Differential/Platelet -     BASIC METABOLIC PANEL WITH GFR  Essential hypertension Continue same for now, BP better.   She will go to the ER if any worsening SOB or if any CP.    HPI 65 y.o. morbidly obese AAF with history of HTN, PE, chol, preDM  presents for BP follow up.  Patient has had elevated BP and worsening venous insuff edema. She was switched to losartan 100 and lasix 40 BID, metoprolol 50 BID, amlodipine was stopped due to worsening edema (8 lb weight gain). Last visit 2 weeks ago minoxidil 10 was added, she has been on 1/2 pill daily. She states she is feeling better other than continuing SOB but has not changed, no CP, no palpitations, and no fatigue/fever, chills. Denies PND/orthopnea.   She states that she is having a hard time with shortness of breath with exertion, she is having trouble completing her ADLS like meals, cleaning, etc.   BP Readings from Last 3 Encounters:  04/14/16 140/88  03/30/16 (!) 160/84  03/01/16 136/84   BMI is Body mass index is 61.46 kg/m., she is working on diet and exercise. Wt Readings from Last 20 Encounters:  04/14/16 (!) 336 lb (152.4 kg)  03/30/16 (!) 330 lb 3.2 oz (149.8 kg)  03/01/16 (!) 328 lb 9.6 oz (149.1 kg)  01/11/16 (!) 325 lb (147.4 kg)  11/25/15 (!) 320 lb (145.2 kg)  10/22/15 (!) 318 lb (144.2 kg)  07/10/15 (!) 321 lb (145.6 kg)  06/12/15 (!) 328 lb (148.8 kg)  04/28/15 (!) 321 lb (145.6 kg)  02/27/15 (!) 320 lb (145.2 kg)  09/04/14 (!) 310 lb (140.6 kg)  06/10/14 (!) 310 lb (140.6 kg)  01/09/14 296 lb 12.8 oz (134.6 kg)   12/05/13 (!) 301 lb (136.5 kg)  05/30/13 265 lb (120.2 kg)  11/22/12 259 lb 0.7 oz (117.5 kg)  10/05/12 263 lb (119.3 kg)  08/21/12 265 lb 12.8 oz (120.6 kg)  08/16/12 268 lb 6.4 oz (121.7 kg)  10/28/10 (!) 288 lb (130.6 kg)     Past Medical History:  Diagnosis Date  . Acute meniscal tear of knee    RIGHT  . Arthritis    "right knee" (11/21/2012)  . Elevated hemoglobin A1c   . Exertional shortness of breath    "this week" (11/21/2012)  . History of DVT (deep vein thrombosis)    IN PREGNANCY  . HTN (hypertension)    CARDIOLOGIST--  DR HOCHREIN-- CLEARANCE NOTE IN EPIC AND CHART FROM 08-21-2012  . Mixed hyperlipidemia   . Obesity   . Pulmonary embolism (HCC)    "3 today" (11/21/2012)  . RBBB (right bundle branch block with left anterior fascicular block)   . Seasonal allergies   . Sinus tachycardia   . Vitamin D deficiency      Allergies  Allergen Reactions  . Hydrocodone     Itch  . Oxycodone Rash  . Tramadol Rash    Current Outpatient Prescriptions on File Prior to Visit  Medication Sig Dispense Refill  . acetaminophen (TYLENOL)  500 MG tablet Take 500 mg by mouth every 6 (six) hours as needed for pain.    Marland Kitchen. aspirin EC 81 MG tablet Take 1 tablet (81 mg total) by mouth daily. 1 tablet 0  . atorvastatin (LIPITOR) 80 MG tablet Take 1/2 to 1 tablet daily or as directed for Cholesterol 30 tablet 11  . Cholecalciferol (VITAMIN D3) 5000 UNITS CAPS Take 1 capsule by mouth daily.    . furosemide (LASIX) 40 MG tablet Take 1 tablet 2 x/ day for BP & Fluid 180 tablet 1  . losartan (COZAAR) 100 MG tablet Take 1 tablet daily for BP 90 tablet 1  . Magnesium Oxide 250 MG TABS Take 1 tablet by mouth daily.     . metoprolol (LOPRESSOR) 50 MG tablet TAKE ONE TABLET BY MOUTH TWICE DAILY 180 tablet 1  . minoxidil (LONITEN) 10 MG tablet Take 1/2 to 1 tablet daily as directed for BP 90 tablet 1  . PROAIR HFA 108 (90 Base) MCG/ACT inhaler     . mometasone (NASONEX) 50 MCG/ACT nasal spray  Place 2 sprays into the nose daily. 17 g 2   No current facility-administered medications on file prior to visit.     ROS: all negative except above.   Physical Exam: Filed Weights   04/14/16 1559  Weight: (!) 336 lb (152.4 kg)   BP 140/88   Pulse 86   Temp 97.2 F (36.2 C)   Resp 16   Ht 5\' 2"  (1.575 m)   Wt (!) 336 lb (152.4 kg)   SpO2 96%   BMI 61.46 kg/m  General Appearance: Well nourished, in no apparent distress. Eyes: PERRLA, EOMs, conjunctiva no swelling or erythema Sinuses: No Frontal/maxillary tenderness ENT/Mouth: Ext aud canals clear, TMs without erythema, bulging. No erythema, swelling, or exudate on post pharynx.  Tonsils not swollen or erythematous. Hearing normal.  Neck: Supple, thyroid normal.  Respiratory: Respiratory effort normal, with decreased diffuse breath sounds.   Cardio: RRR with no MRGs. Brisk peripheral pulses with trace edema.  Abdomen: Soft, + BS, morbidly obese Non tender, no guarding, rebound, hernias, masses. Lymphatics: Non tender without lymphadenopathy.  Musculoskeletal: Full ROM, 5/5 strength, antalgic gait due to body habitus.   Skin: Warm, dry without rashes, lesions, ecchymosis.  Neuro: Cranial nerves intact. Normal muscle tone, no cerebellar symptoms. Sensation intact.  Psych: Awake and oriented X 3, normal affect, Insight and Judgment appropriate.     Quentin MullingAmanda Enyah Moman, PA-C 7:54 AM [facility]

## 2016-04-14 NOTE — Patient Instructions (Addendum)
I would call social services again about needs/ADLS   Shortness of Breath Shortness of breath means you have trouble breathing. It could also mean that you have a medical problem. You should get immediate medical care for shortness of breath. CAUSES   Not enough oxygen in the air such as with high altitudes or a smoke-filled room.  Certain lung diseases, infections, or problems.  Heart disease or conditions, such as angina or heart failure.  Low red blood cells (anemia).  Poor physical fitness, which can cause shortness of breath when you exercise.  Chest or back injuries or stiffness.  Being overweight.  Smoking.  Anxiety, which can make you feel like you are not getting enough air. DIAGNOSIS  Serious medical problems can often be found during your physical exam. Tests may also be done to determine why you are having shortness of breath. Tests may include:  Chest X-rays.  Lung function tests.  Blood tests.  An electrocardiogram (ECG).  An ambulatory electrocardiogram. An ambulatory ECG records your heartbeat patterns over a 24-hour period.  Exercise testing.  A transthoracic echocardiogram (TTE). During echocardiography, sound waves are used to evaluate how blood flows through your heart.  A transesophageal echocardiogram (TEE).  Imaging scans. Your health care provider may not be able to find a cause for your shortness of breath after your exam. In this case, it is important to have a follow-up exam with your health care provider as directed.  TREATMENT  Treatment for shortness of breath depends on the cause of your symptoms and can vary greatly. HOME CARE INSTRUCTIONS   Do not smoke. Smoking is a common cause of shortness of breath. If you smoke, ask for help to quit.  Avoid being around chemicals or things that may bother your breathing, such as paint fumes and dust.  Rest as needed. Slowly resume your usual activities.  If medicines were prescribed, take  them as directed for the full length of time directed. This includes oxygen and any inhaled medicines.  Keep all follow-up appointments as directed by your health care provider. SEEK MEDICAL CARE IF:   Your condition does not improve in the time expected.  You have a hard time doing your normal activities even with rest.  You have any new symptoms. SEEK IMMEDIATE MEDICAL CARE IF:   Your shortness of breath gets worse.  You feel light-headed, faint, or develop a cough not controlled with medicines.  You start coughing up blood.  You have pain with breathing.  You have chest pain or pain in your arms, shoulders, or abdomen.  You have a fever.  You are unable to walk up stairs or exercise the way you normally do. MAKE SURE YOU:  Understand these instructions.  Will watch your condition.  Will get help right away if you are not doing well or get worse.   This information is not intended to replace advice given to you by your health care provider. Make sure you discuss any questions you have with your health care provider.   Document Released: 03/08/2001 Document Revised: 06/18/2013 Document Reviewed: 08/29/2011 [employer] Interactive Patient Education [employer]2016 [employer]

## 2016-04-15 LAB — BASIC METABOLIC PANEL WITH GFR
BUN: 14 mg/dL (ref 7–25)
CO2: 26 mmol/L (ref 20–31)
Calcium: 9 mg/dL (ref 8.6–10.4)
Chloride: 107 mmol/L (ref 98–110)
Creat: 0.94 mg/dL (ref 0.50–0.99)
GFR, EST NON AFRICAN AMERICAN: 65 mL/min (ref >=60)
GFR, Est African American: 75 mL/min (ref >=60)
GLUCOSE: 136 mg/dL — AB (ref 65–99)
POTASSIUM: 4.3 mmol/L (ref 3.5–5.3)
Sodium: 142 mmol/L (ref 135–146)

## 2016-04-15 LAB — CBC WITH DIFFERENTIAL/PLATELET
BASOS PCT: 0 %
Basophils Absolute: 0 cells/uL (ref 0–200)
Eosinophils Absolute: 275 cells/uL (ref 15–500)
Eosinophils Relative: 5 %
HEMATOCRIT: 40.7 % (ref 35.0–45.0)
HEMOGLOBIN: 13.2 g/dL (ref 11.7–15.5)
LYMPHS ABS: 1100 {cells}/uL (ref 850–3900)
Lymphocytes Relative: 20 %
MCH: 29 pg (ref 27.0–33.0)
MCHC: 32.4 g/dL (ref 32.0–36.0)
MCV: 89.5 fL (ref 80.0–100.0)
MONO ABS: 550 {cells}/uL (ref 200–950)
MPV: 10.7 fL (ref 7.5–12.5)
Monocytes Relative: 10 %
NEUTROS PCT: 65 %
Neutro Abs: 3575 cells/uL (ref 1500–7800)
Platelets: 249 10*3/uL (ref 140–400)
RBC: 4.55 MIL/uL (ref 3.80–5.10)
RDW: 12.9 % (ref 11.0–15.0)
WBC: 5.5 10*3/uL (ref 3.8–10.8)

## 2016-04-15 LAB — BRAIN NATRIURETIC PEPTIDE: Brain Natriuretic Peptide: 184.7 pg/mL — ABNORMAL HIGH (ref <100)

## 2016-04-19 ENCOUNTER — Ambulatory Visit (HOSPITAL_COMMUNITY)
Admission: RE | Admit: 2016-04-19 | Discharge: 2016-04-19 | Disposition: A | Discharge status: Home / Self Care | Payer: Medicare Other | Source: Ambulatory Visit | Attending: Physician Assistant | Admitting: Physician Assistant

## 2016-04-19 DIAGNOSIS — I517 Cardiomegaly: Secondary | ICD-10-CM | POA: Diagnosis not present

## 2016-04-19 DIAGNOSIS — R0609 Other forms of dyspnea: Secondary | ICD-10-CM | POA: Insufficient documentation

## 2016-04-19 DIAGNOSIS — R069 Unspecified abnormalities of breathing: Secondary | ICD-10-CM | POA: Diagnosis not present

## 2016-05-05 ENCOUNTER — Encounter: Payer: Self-pay | Admitting: Neurology

## 2016-05-05 ENCOUNTER — Ambulatory Visit (INDEPENDENT_AMBULATORY_CARE_PROVIDER_SITE_OTHER): Payer: Medicare Other | Admitting: Neurology

## 2016-05-05 VITALS — BP 192/100 | HR 112 | Resp 20 | Ht 62.0 in | Wt 324.0 lb

## 2016-05-05 DIAGNOSIS — G4733 Obstructive sleep apnea (adult) (pediatric): Secondary | ICD-10-CM | POA: Diagnosis not present

## 2016-05-05 DIAGNOSIS — Z9989 Dependence on other enabling machines and devices: Secondary | ICD-10-CM | POA: Diagnosis not present

## 2016-05-05 DIAGNOSIS — R0902 Hypoxemia: Secondary | ICD-10-CM

## 2016-05-05 DIAGNOSIS — G4736 Sleep related hypoventilation in conditions classified elsewhere: Secondary | ICD-10-CM

## 2016-05-05 DIAGNOSIS — E669 Obesity, unspecified: Secondary | ICD-10-CM

## 2016-05-05 NOTE — Progress Notes (Signed)
SLEEP MEDICINE CLINIC   Provider:  Melvyn Novasarmen  Benetta Maclaren, M D  Referring Provider: Lucky CowboyMcKeown, William, MD Primary Care Physician:  Nadean CorwinMCKEOWN,WILLIAM DAVID, MD  Chief Complaint  Patient presents with  . Follow-up    cpap going ok    HPI:  Kerry Liu is a 65 y.o. female , seen here as a referral  from Dr. Oneta RackMcKeown for a sleep consultation.   Chief complaint according to patient : " I don't sleep good at night, I quit breathing and I wake tired"  Mrs. Franco ColletBartley reports that she has been struggling to lose weight, she has some back pain and she has seen the specialist according to her complaint. And she was with her primary care physician she had mentioned that she feels very fatigued and actually excessively sleepy and then also mentions that it friend of hers had witnessed her to snore and stop breathing at night. She has an elevated body mass index and also reports she feels that she doesn't sleep ever enough to be refreshed and restored. She does not have insomnia there is no problem to fall asleep and stay asleep. Late May when she saw her primary care office she was given some dietary and exercise advice. And a home sleep test with apnea link was ordered. She states that she went through that home sleep test and was positive for apnea. She is borderline elevated with her hemoglobin A1c at 5.8, her total cholesterol is 203 which is not alarming. Her blood pressure had been 132/60.mmHg. she is currently looking into exercise clubs in her neighborhood. Sleep habits are as follows: She goes to bed around 11 PM and usually is asleep within 30 minutes. Before she goes to bed she usually watches TV, she reports her bedroom is dark and quiet but not cool she does not have air conditioning. She lives with her grandson who has a cat but the States usually in her grandson's room. She does have some environmental allergies and possibly to cat hair ,too. She does not have fragmented sleep I nocturia usually she  goes to the bathroom before she goes to bed and right in the morning. She prefers to sleep on her side but sometimes rolls to her back, he sleeps on 2 pillows. She uses a flat bad with a non-adjustable head rest, she does not recall dreaming a lot. She certainly does not have a lot of nightmares, has not sleepwalked and she has no history of any seizure activity or complex behaviors of sleep. She wakes up spontaneously in the morning does not rely on an alarm. She is usually not refreshed or restored but would like to sleep little longer. Since she is now retired she doesn't have to get up at a certain early time, and her grandson is adult and doesn't need her help in the morning.   Sleep medical history and family sleep history:  Social history:  Worked at [employer], retired at age 65, she is a lifelong nontobacco user has never smoked. She has never been drinking alcohol, she has been using caffeine rather heavily, but now is only drinking 2 drinks/ days. She is divorced and lives alone except for her adult grandson that she has a house with her.  I had the pleasure of seeing Mrs. Dismuke today on 05/05/2016 recently underwent a polysomnography followed later by a CPAP polysomnography. Her baseline study took place on July 26 and she was diagnosed with a rather mild form of apnea AHI  was 8.6, during REM sleep accentuated to 30.2 she could not tolerate supine sleep. She had 282 minutes of oxygen desaturation which qualified her to return for CPAP titration. This took place on 02/14/2016 when she was titrated to 9 cm water,which improved the oxygen saturation she had good sleep efficiency using a nasal pillow AIrFit P 10 in medium size.  She is compliant using distilled water , filling the reservoir to the maximum notch. She states in the morning she still has about half of the fluid in the tank. She received a fancy air since machine by [employer] and we're reviewing today her compliance data  unfortunately the patient would told me believably that she has used since September ongoing nightly CPAP had only 8 days of use recorded and just for the last week. Her compliance is over 80% for that week with an average user time of 4 hours and 57 minutes at night set pressure is 9 cm with 3 cm EPR and she has a high air leak. Residual apnea index is 1.1. I cannot really explain why the machine would not record her user time. DME is [employer]- needs additional teaching!!   Review of Systems: Out of a complete 14 system review, the patient complains of only the following symptoms, and all other reviewed systems are negative. Snoring, witnessed apnea, fatigue and tiredness during the day excessive descent sleepiness.  How likely are you to doze in the following situations: 0 = not likely, 1 = slight chance, 2 = moderate chance, 3 = high chance  Sitting and Reading?2 Watching Television?2 Sitting inactive in a public place (theater or meeting)?2 Lying down in the afternoon when circumstances permit?2 Sitting and talking to someone?2 Sitting quietly after lunch without alcohol?3 In a car, while stopped for a few minutes in traffic?3 As a passenger in a car for an hour without a break?3  Total = 19 on CPAP !   , Fatigue severity score 54 , depression score 2/15    Social History   Social History  . Marital status: Single    Spouse name: N/A  . Number of children: N/A  . Years of education: N/A   Occupational History  . housekeeper [employer]   Social History Main Topics  . Smoking status: Never Smoker  . Smokeless tobacco: Never Used  . Alcohol use No  . Drug use: No  . Sexual activity: No   Other Topics Concern  . Not on file   Social History Narrative  . No narrative on file    Family History  Problem Relation Age of Onset  . Heart attack    . Heart attack Father   . Hypertension Brother   . Diabetes Brother   . Stroke Brother   . Sudden death Brother     Past  Medical History:  Diagnosis Date  . Acute meniscal tear of knee    RIGHT  . Arthritis    "right knee" (11/21/2012)  . Elevated hemoglobin A1c   . Exertional shortness of breath    "this week" (11/21/2012)  . History of DVT (deep vein thrombosis)    IN PREGNANCY  . HTN (hypertension)    CARDIOLOGIST--  DR HOCHREIN-- CLEARANCE NOTE IN EPIC AND CHART FROM 08-21-2012  . Mixed hyperlipidemia   . Obesity   . Pulmonary embolism (HCC)    "3 today" (11/21/2012)  . RBBB (right bundle branch block with left anterior fascicular block)   . Seasonal allergies   . Sinus tachycardia   .  Vitamin D deficiency     Past Surgical History:  Procedure Laterality Date  . IVC filter placed in May 2014 N/A   . KNEE ARTHROSCOPY Right 12-09-2002  . KNEE ARTHROSCOPY WITH MEDIAL MENISECTOMY Right 10/05/2012   Procedure: KNEE ARTHROSCOPY WITH MEDIAL MENISECTOMY;  Surgeon: Jacki Conesonald A Gioffre, MD;  [facility];  Service: Orthopedics;  Laterality: Right;  . TOTAL ABDOMINAL HYSTERECTOMY W/ BILATERAL SALPINGOOPHORECTOMY  ~ 1986   W/ BILATERAL SALPINGOOPHORECTOMY  . TRANSTHORACIC ECHOCARDIOGRAM  11-17-2010   MILD LVH/ EF 60-65%/ GRADE I DIASTOLIC DYSFUNCTION/ MILD LAE / MILD RAE    Current Outpatient Prescriptions  Medication Sig Dispense Refill  . acetaminophen (TYLENOL) 500 MG tablet Take 500 mg by mouth every 6 (six) hours as needed for pain.    Marland Kitchen. aspirin EC 81 MG tablet Take 1 tablet (81 mg total) by mouth daily. 1 tablet 0  . atorvastatin (LIPITOR) 80 MG tablet Take 1/2 to 1 tablet daily or as directed for Cholesterol 30 tablet 11  . Cholecalciferol (VITAMIN D3) 5000 UNITS CAPS Take 1 capsule by mouth daily.    . furosemide (LASIX) 40 MG tablet Take 1 tablet 2 x/ day for BP & Fluid 180 tablet 1  . losartan (COZAAR) 100 MG tablet Take 1 tablet daily for BP 90 tablet 1  . Magnesium Oxide 250 MG TABS Take 1 tablet by mouth daily.     . metoprolol (LOPRESSOR) 50 MG tablet TAKE ONE TABLET  BY MOUTH TWICE DAILY 180 tablet 1  . minoxidil (LONITEN) 10 MG tablet Take 1/2 to 1 tablet daily as directed for BP 90 tablet 1  . PROAIR HFA 108 (90 Base) MCG/ACT inhaler     . mometasone (NASONEX) 50 MCG/ACT nasal spray Place 2 sprays into the nose daily. 17 g 2   No current facility-administered medications for this visit.     Allergies as of 05/05/2016 - Review Complete 05/05/2016  Allergen Reaction Noted  . Codeine Other (See Comments) 03/21/2016  . Hydrocodone  05/29/2013  . Oxycodone Rash 08/16/2012  . Tramadol Rash 10/01/2012    Vitals: BP (!) 192/100   Pulse (!) 112   Resp 20   Ht 5\' 2"  (1.575 m)   Wt (!) 324 lb (147 kg)   BMI 59.26 kg/m  Last Weight:  Wt Readings from Last 1 Encounters:  05/05/16 (!) 324 lb (147 kg)   XBJ:YNWGBMI:Body mass index is 59.26 kg/m.     Last Height:   Ht Readings from Last 1 Encounters:  05/05/16 5\' 2"  (1.575 m)    Physical exam:  General: The patient is awake, alert and appears not in acute distress. The patient is well groomed. Head: Normocephalic, atraumatic. Neck is supple. Mallampati 4  neck circumference:17. Nasal airflow congested , TMJ is  evident . Retrognathia is seen. Poor dental status with prognathia and gap  Cardiovascular:  Regular rate and rhythm, without  murmurs or carotid bruit, and without distended neck veins. Respiratory: Lungs are clear to auscultation. Skin:  Without evidence of edema, or rash Trunk: BMI is elevated . The patient's posture is erect    Neurologic exam : The patient is awake and alert, oriented to place and time.   Memory subjective described as intact. Attention span & concentration ability appears normal.  Speech is fluent,  without dysarthria, dysphonia or aphasia.  Mood and affect are appropriate.  Cranial nerves: Pupils are equal and briskly reactive to light. Extraocular movements  in vertical and horizontal planes  intact and without nystagmus. Visual fields by finger perimetry are  intact. Hearing to finger rub intact.   Facial sensation intact to fine touch.  Facial motor strength is symmetric and tongue and uvula move midline. Shoulder shrug was symmetrical.   The patient was advised of the nature of the diagnosed sleep disorder , the treatment options and risks for general a health and wellness arising from not treating the condition.  I spent more than 30 minutes of face to face time with the patient. Greater than 50% of time was spent in counseling and coordination of care. We have discussed the diagnosis and differential and I answered the patient's questions.     Assessment:  After physical and neurologic examination, review of laboratory studies,  Personal review of imaging studies, reports of other /same  Imaging studies ,   Results of polysomnography/ neurophysiology testing and pre-existing records as far as provided in visit., my assessment is   1)   I discussed today the sleep test results with Mrs. Rockefeller received a air since CPAP machine by [employer] set at 9 cm water pressure. There may have been a mistake made in the way she attached the holes and it did not record her user time correctly. I adjusted humidity level from 4-6, reduced the ramp from 4:50 minutes, and documented how to put on the mask headgear and attached tubing. Her sleep report shoulder red and :-( for air leaks. I think this was due to the nasal pillow being incorrectly inserted into the headgear. She does like a nasal pillow however and will keep using it. I would like to [employer] her [employer] to see her for some additional teaching.  Compliance report was erroneously low. DME to teach and inform patient.    Porfirio Mylar Blaiden Werth MD  05/05/2016   CC: Lucky Cowboy, Md [address] [place], [place] [address]

## 2016-05-05 NOTE — Patient Instructions (Signed)
Kerry Liu is still getting familiar with his CPAP machine, but she is still excessively daytime sleepy. I would like for her to see her [employer] rep for additional teaching, improving the compliance and also add an order for an overnight pulse clip to make sure that her oxygen levels are corrected by using CPAP alone. If she should need additional oxygen B will order this through our [employer].

## 2016-06-08 ENCOUNTER — Inpatient Hospital Stay (HOSPITAL_COMMUNITY): Payer: Medicare Other

## 2016-06-08 ENCOUNTER — Encounter (HOSPITAL_COMMUNITY): Payer: Self-pay | Admitting: Emergency Medicine

## 2016-06-08 ENCOUNTER — Emergency Department (HOSPITAL_COMMUNITY): Payer: Medicare Other

## 2016-06-08 ENCOUNTER — Inpatient Hospital Stay (HOSPITAL_COMMUNITY)
Admit: 2016-06-08 | Discharge: 2016-06-08 | Disposition: A | Discharge status: Home / Self Care | Payer: Medicare Other | Attending: Internal Medicine | Admitting: Internal Medicine

## 2016-06-08 ENCOUNTER — Inpatient Hospital Stay (HOSPITAL_COMMUNITY)
Admission: EM | Admit: 2016-06-08 | Discharge: 2016-06-12 | DRG: 176 | Disposition: A | Discharge status: Home Health | Payer: Medicare Other | Attending: Internal Medicine | Admitting: Internal Medicine

## 2016-06-08 DIAGNOSIS — I5032 Chronic diastolic (congestive) heart failure: Secondary | ICD-10-CM | POA: Diagnosis present

## 2016-06-08 DIAGNOSIS — Z7982 Long term (current) use of aspirin: Secondary | ICD-10-CM | POA: Diagnosis not present

## 2016-06-08 DIAGNOSIS — I248 Other forms of acute ischemic heart disease: Secondary | ICD-10-CM | POA: Diagnosis present

## 2016-06-08 DIAGNOSIS — E785 Hyperlipidemia, unspecified: Secondary | ICD-10-CM | POA: Diagnosis not present

## 2016-06-08 DIAGNOSIS — R748 Abnormal levels of other serum enzymes: Secondary | ICD-10-CM | POA: Diagnosis not present

## 2016-06-08 DIAGNOSIS — I2699 Other pulmonary embolism without acute cor pulmonale: Secondary | ICD-10-CM

## 2016-06-08 DIAGNOSIS — R000 Tachycardia, unspecified: Secondary | ICD-10-CM | POA: Diagnosis present

## 2016-06-08 DIAGNOSIS — I110 Hypertensive heart disease with heart failure: Secondary | ICD-10-CM | POA: Diagnosis present

## 2016-06-08 DIAGNOSIS — Z885 Allergy status to narcotic agent status: Secondary | ICD-10-CM

## 2016-06-08 DIAGNOSIS — M1710 Unilateral primary osteoarthritis, unspecified knee: Secondary | ICD-10-CM | POA: Diagnosis present

## 2016-06-08 DIAGNOSIS — Z833 Family history of diabetes mellitus: Secondary | ICD-10-CM | POA: Diagnosis not present

## 2016-06-08 DIAGNOSIS — Z90710 Acquired absence of both cervix and uterus: Secondary | ICD-10-CM

## 2016-06-08 DIAGNOSIS — Z86718 Personal history of other venous thrombosis and embolism: Secondary | ICD-10-CM | POA: Diagnosis not present

## 2016-06-08 DIAGNOSIS — E860 Dehydration: Secondary | ICD-10-CM | POA: Diagnosis present

## 2016-06-08 DIAGNOSIS — Z6841 Body Mass Index (BMI) 40.0 and over, adult: Secondary | ICD-10-CM | POA: Diagnosis not present

## 2016-06-08 DIAGNOSIS — I2692 Saddle embolus of pulmonary artery without acute cor pulmonale: Principal | ICD-10-CM | POA: Diagnosis present

## 2016-06-08 DIAGNOSIS — G4733 Obstructive sleep apnea (adult) (pediatric): Secondary | ICD-10-CM

## 2016-06-08 DIAGNOSIS — I10 Essential (primary) hypertension: Secondary | ICD-10-CM | POA: Diagnosis not present

## 2016-06-08 DIAGNOSIS — E782 Mixed hyperlipidemia: Secondary | ICD-10-CM | POA: Diagnosis present

## 2016-06-08 DIAGNOSIS — I452 Bifascicular block: Secondary | ICD-10-CM | POA: Diagnosis present

## 2016-06-08 DIAGNOSIS — R6510 Systemic inflammatory response syndrome (SIRS) of non-infectious origin without acute organ dysfunction: Secondary | ICD-10-CM | POA: Diagnosis present

## 2016-06-08 DIAGNOSIS — R609 Edema, unspecified: Secondary | ICD-10-CM | POA: Diagnosis not present

## 2016-06-08 DIAGNOSIS — Z8249 Family history of ischemic heart disease and other diseases of the circulatory system: Secondary | ICD-10-CM

## 2016-06-08 DIAGNOSIS — Z9989 Dependence on other enabling machines and devices: Secondary | ICD-10-CM

## 2016-06-08 DIAGNOSIS — E662 Morbid (severe) obesity with alveolar hypoventilation: Secondary | ICD-10-CM | POA: Diagnosis present

## 2016-06-08 DIAGNOSIS — R0602 Shortness of breath: Secondary | ICD-10-CM | POA: Diagnosis not present

## 2016-06-08 DIAGNOSIS — Z86711 Personal history of pulmonary embolism: Secondary | ICD-10-CM | POA: Diagnosis not present

## 2016-06-08 DIAGNOSIS — R778 Other specified abnormalities of plasma proteins: Secondary | ICD-10-CM | POA: Diagnosis present

## 2016-06-08 DIAGNOSIS — I4510 Unspecified right bundle-branch block: Secondary | ICD-10-CM | POA: Diagnosis present

## 2016-06-08 DIAGNOSIS — Z823 Family history of stroke: Secondary | ICD-10-CM

## 2016-06-08 DIAGNOSIS — J9601 Acute respiratory failure with hypoxia: Secondary | ICD-10-CM | POA: Diagnosis present

## 2016-06-08 DIAGNOSIS — N179 Acute kidney failure, unspecified: Secondary | ICD-10-CM | POA: Diagnosis not present

## 2016-06-08 DIAGNOSIS — R7989 Other specified abnormal findings of blood chemistry: Secondary | ICD-10-CM | POA: Diagnosis present

## 2016-06-08 DIAGNOSIS — Z888 Allergy status to other drugs, medicaments and biological substances status: Secondary | ICD-10-CM | POA: Diagnosis not present

## 2016-06-08 HISTORY — DX: Sleep apnea, unspecified: G47.30

## 2016-06-08 LAB — CBC WITH DIFFERENTIAL/PLATELET
BASOS ABS: 0 10*3/uL (ref 0.0–0.1)
BASOS PCT: 0 %
EOS ABS: 0.1 10*3/uL (ref 0.0–0.7)
EOS PCT: 1 %
HCT: 41.1 % (ref 36.0–46.0)
Hemoglobin: 13 g/dL (ref 12.0–15.0)
Lymphocytes Relative: 11 %
Lymphs Abs: 1.1 10*3/uL (ref 0.7–4.0)
MCH: 28.8 pg (ref 26.0–34.0)
MCHC: 31.6 g/dL (ref 30.0–36.0)
MCV: 91.1 fL (ref 78.0–100.0)
MONO ABS: 0.8 10*3/uL (ref 0.1–1.0)
Monocytes Relative: 7 %
Neutro Abs: 8.4 10*3/uL — ABNORMAL HIGH (ref 1.7–7.7)
Neutrophils Relative %: 81 %
PLATELETS: 207 10*3/uL (ref 150–400)
RBC: 4.51 MIL/uL (ref 3.87–5.11)
RDW: 13.1 % (ref 11.5–15.5)
WBC: 10.4 10*3/uL (ref 4.0–10.5)

## 2016-06-08 LAB — COMPREHENSIVE METABOLIC PANEL
ALK PHOS: 113 U/L (ref 38–126)
ALT: 25 U/L (ref 14–54)
AST: 22 U/L (ref 15–41)
Albumin: 3.6 g/dL (ref 3.5–5.0)
Anion gap: 8 (ref 5–15)
BILIRUBIN TOTAL: 0.9 mg/dL (ref 0.3–1.2)
BUN: 37 mg/dL — AB (ref 6–20)
CALCIUM: 9.1 mg/dL (ref 8.9–10.3)
CHLORIDE: 105 mmol/L (ref 101–111)
CO2: 25 mmol/L (ref 22–32)
CREATININE: 1.91 mg/dL — AB (ref 0.44–1.00)
GFR, EST AFRICAN AMERICAN: 31 mL/min — AB (ref >60)
GFR, EST NON AFRICAN AMERICAN: 27 mL/min — AB (ref >60)
Glucose, Bld: 242 mg/dL — ABNORMAL HIGH (ref 65–99)
Potassium: 4.3 mmol/L (ref 3.5–5.1)
Sodium: 138 mmol/L (ref 135–145)
TOTAL PROTEIN: 7.5 g/dL (ref 6.5–8.1)

## 2016-06-08 LAB — I-STAT TROPONIN, ED: TROPONIN I, POC: 0.43 ng/mL — AB (ref 0.00–0.08)

## 2016-06-08 LAB — CBG MONITORING, ED: GLUCOSE-CAPILLARY: 207 mg/dL — AB (ref 65–99)

## 2016-06-08 LAB — TROPONIN I
TROPONIN I: 0.62 ng/mL — AB (ref <0.03)
Troponin I: 0.62 ng/mL (ref <0.03)
Troponin I: 0.57 ng/mL (ref <0.03)

## 2016-06-08 LAB — HEPARIN LEVEL (UNFRACTIONATED): HEPARIN UNFRACTIONATED: 0.86 [IU]/mL — AB (ref 0.30–0.70)

## 2016-06-08 LAB — MRSA PCR SCREENING: MRSA by PCR: NEGATIVE

## 2016-06-08 LAB — ECHOCARDIOGRAM COMPLETE
Height: 62 in
WEIGHTICAEL: 5184 [oz_av]

## 2016-06-08 LAB — BRAIN NATRIURETIC PEPTIDE: B Natriuretic Peptide: 35.9 pg/mL (ref 0.0–100.0)

## 2016-06-08 LAB — LIPASE, BLOOD: LIPASE: 27 U/L (ref 11–51)

## 2016-06-08 LAB — CREATININE, URINE, RANDOM: Creatinine, Urine: 290.6 mg/dL

## 2016-06-08 MED ORDER — ACETAMINOPHEN 325 MG PO TABS
650.0000 mg | ORAL_TABLET | Freq: Four times a day (QID) | ORAL | Status: DC | PRN
  Administered 2016-06-09 (×2): 650 mg via ORAL
  Filled 2016-06-08 (×2): qty 2

## 2016-06-08 MED ORDER — METOPROLOL TARTRATE 50 MG PO TABS
50.0000 mg | ORAL_TABLET | Freq: Two times a day (BID) | ORAL | Status: DC
  Administered 2016-06-08 – 2016-06-12 (×7): 50 mg via ORAL
  Filled 2016-06-08 (×2): qty 1
  Filled 2016-06-08: qty 2
  Filled 2016-06-08 (×4): qty 1

## 2016-06-08 MED ORDER — VITAMIN D 1000 UNITS PO TABS
5000.0000 [IU] | ORAL_TABLET | Freq: Every day | ORAL | Status: DC
  Administered 2016-06-08 – 2016-06-12 (×5): 5000 [IU] via ORAL
  Filled 2016-06-08 (×5): qty 5

## 2016-06-08 MED ORDER — HEPARIN (PORCINE) IN NACL 100-0.45 UNIT/ML-% IJ SOLN
1500.0000 [IU]/h | INTRAMUSCULAR | Status: DC
  Administered 2016-06-08 – 2016-06-11 (×6): 1500 [IU]/h via INTRAVENOUS
  Filled 2016-06-08 (×8): qty 250

## 2016-06-08 MED ORDER — ASPIRIN EC 81 MG PO TBEC
81.0000 mg | DELAYED_RELEASE_TABLET | Freq: Every day | ORAL | Status: DC
  Administered 2016-06-08 – 2016-06-12 (×5): 81 mg via ORAL
  Filled 2016-06-08 (×6): qty 1

## 2016-06-08 MED ORDER — ZOLPIDEM TARTRATE 5 MG PO TABS: 5.0000 mg | ORAL_TABLET | Freq: Every evening | ORAL | Status: DC | PRN

## 2016-06-08 MED ORDER — IOPAMIDOL (ISOVUE-370) INJECTION 76%
100.0000 mL | Freq: Once | INTRAVENOUS | Status: AC | PRN
  Administered 2016-06-08: 80 mL via INTRAVENOUS

## 2016-06-08 MED ORDER — SODIUM CHLORIDE 0.9 % IV BOLUS (SEPSIS)
500.0000 mL | Freq: Once | INTRAVENOUS | Status: AC
  Administered 2016-06-08: 500 mL via INTRAVENOUS

## 2016-06-08 MED ORDER — ATORVASTATIN CALCIUM 40 MG PO TABS
40.0000 mg | ORAL_TABLET | Freq: Every day | ORAL | Status: DC
  Administered 2016-06-08 – 2016-06-12 (×5): 40 mg via ORAL
  Filled 2016-06-08 (×6): qty 1

## 2016-06-08 MED ORDER — ONDANSETRON HCL 4 MG/2ML IJ SOLN: 4.0000 mg | Freq: Four times a day (QID) | INTRAMUSCULAR | Status: DC | PRN

## 2016-06-08 MED ORDER — MINOXIDIL 2.5 MG PO TABS
2.5000 mg | ORAL_TABLET | Freq: Every day | ORAL | Status: DC
  Administered 2016-06-08 – 2016-06-12 (×5): 2.5 mg via ORAL
  Filled 2016-06-08 (×5): qty 1

## 2016-06-08 MED ORDER — FLUTICASONE PROPIONATE 50 MCG/ACT NA SUSP
1.0000 | Freq: Every day | NASAL | Status: DC
  Administered 2016-06-08 – 2016-06-12 (×4): 1 via NASAL
  Filled 2016-06-08 (×2): qty 16

## 2016-06-08 MED ORDER — HEPARIN (PORCINE) IN NACL 100-0.45 UNIT/ML-% IJ SOLN
1700.0000 [IU]/h | INTRAMUSCULAR | Status: DC
  Administered 2016-06-08: 1700 [IU]/h via INTRAVENOUS
  Filled 2016-06-08 (×2): qty 250

## 2016-06-08 MED ORDER — PERFLUTREN LIPID MICROSPHERE
1.0000 mL | INTRAVENOUS | Status: AC | PRN
  Administered 2016-06-08: 2 mL via INTRAVENOUS

## 2016-06-08 MED ORDER — HEPARIN SODIUM (PORCINE) 5000 UNIT/ML IJ SOLN
4000.0000 [IU] | INTRAMUSCULAR | Status: AC
  Administered 2016-06-08: 4000 [IU] via INTRAVENOUS
  Filled 2016-06-08: qty 0.8

## 2016-06-08 MED ORDER — ALBUTEROL SULFATE (2.5 MG/3ML) 0.083% IN NEBU: 5.0000 mg | INHALATION_SOLUTION | RESPIRATORY_TRACT | Status: DC | PRN

## 2016-06-08 MED ORDER — PERFLUTREN LIPID MICROSPHERE
INTRAVENOUS | Status: AC
  Filled 2016-06-08: qty 10

## 2016-06-08 MED ORDER — MAGNESIUM OXIDE 400 (241.3 MG) MG PO TABS
400.0000 mg | ORAL_TABLET | Freq: Every day | ORAL | Status: DC
  Administered 2016-06-08 – 2016-06-12 (×5): 400 mg via ORAL
  Filled 2016-06-08 (×5): qty 1

## 2016-06-08 MED ORDER — SODIUM CHLORIDE 0.9 % IJ SOLN
INTRAMUSCULAR | Status: AC
  Filled 2016-06-08: qty 50

## 2016-06-08 MED ORDER — IOPAMIDOL (ISOVUE-300) INJECTION 61%
INTRAVENOUS | Status: AC
  Filled 2016-06-08: qty 100

## 2016-06-08 MED ORDER — SODIUM CHLORIDE 0.9% FLUSH
3.0000 mL | Freq: Two times a day (BID) | INTRAVENOUS | Status: DC
  Administered 2016-06-09: 3 mL via INTRAVENOUS

## 2016-06-08 MED ORDER — ASPIRIN 81 MG PO CHEW
324.0000 mg | CHEWABLE_TABLET | Freq: Once | ORAL | Status: AC
  Administered 2016-06-08: 324 mg via ORAL
  Filled 2016-06-08: qty 4

## 2016-06-08 MED ORDER — ALBUTEROL SULFATE (2.5 MG/3ML) 0.083% IN NEBU
5.0000 mg | INHALATION_SOLUTION | Freq: Once | RESPIRATORY_TRACT | Status: AC
  Administered 2016-06-08: 5 mg via RESPIRATORY_TRACT
  Filled 2016-06-08: qty 6

## 2016-06-08 MED ORDER — IOPAMIDOL (ISOVUE-370) INJECTION 76%
INTRAVENOUS | Status: AC
  Filled 2016-06-08: qty 100

## 2016-06-08 MED ORDER — HYDRALAZINE HCL 20 MG/ML IJ SOLN: 5.0000 mg | INTRAMUSCULAR | Status: DC | PRN

## 2016-06-08 MED ORDER — ONDANSETRON HCL 4 MG PO TABS: 4.0000 mg | ORAL_TABLET | Freq: Four times a day (QID) | ORAL | Status: DC | PRN

## 2016-06-08 NOTE — Progress Notes (Signed)
ANTICOAGULATION CONSULT NOTE - follow-up Consult  Pharmacy Consult for Heparin Indication: chest pain/ACS and pulmonary embolus  Allergies  Allergen Reactions  . Codeine Other (See Comments)    Unknown  . Hydrocodone     Itch  . Oxycodone Rash  . Tramadol Rash    Patient Measurements: Height: 5\' 2"  (157.5 cm) Weight: (!) 331 lb 9.2 oz (150.4 kg) IBW/kg (Calculated) : 50.1 Heparin Dosing Weight: 89kg  Vital Signs: BP: 92/60 (12/13 1510) Pulse Rate: 84 (12/13 1510)  Labs:  Recent Labs  06/08/16 0215 06/08/16 0551 06/08/16 1103 06/08/16 1419  HGB 13.0  --   --   --   HCT 41.1  --   --   --   PLT 207  --   --   --   HEPARINUNFRC  --   --   --  0.86*  CREATININE 1.91*  --   --   --   TROPONINI  --  0.62* 0.62*  --     Estimated Creatinine Clearance: 42.9 mL/min (by C-G formula based on SCr of 1.91 mg/dL (H)).   Medical History: Past Medical History:  Diagnosis Date  . Acute meniscal tear of knee    RIGHT  . Arthritis    "right knee" (11/21/2012)  . Elevated hemoglobin A1c   . Exertional shortness of breath    "this week" (11/21/2012)  . History of DVT (deep vein thrombosis)    IN PREGNANCY  . HTN (hypertension)    CARDIOLOGIST--  DR HOCHREIN-- CLEARANCE NOTE IN EPIC AND CHART FROM 08-21-2012  . Mixed hyperlipidemia   . Obesity   . Pulmonary embolism (HCC)    "3 today" (11/21/2012)  . RBBB (right bundle branch block with left anterior fascicular block)   . Seasonal allergies   . Sinus tachycardia   . Sleep apnea   . Vitamin D deficiency     Medications:  Infusions:  . heparin 1,700 Units/hr (06/08/16 0509)    Assessment: 89 YOF presents with SOB. She has history of PE. Heparin ordered for PE, prior heparin bolus for ACS ordered due to + Troponin.  CTA confirms saddle PE with evidence of R heart strain per CT but ECHO did not reveal any acute abnormalities.   Today, 06/08/2016  First heparin level supratherapeutic (=0.86) on 1700  units/hr  Heparin infusing at expected rate  Baseline CBC WNL  SCr is elevated  No bleeding noted  Goal of Therapy:  Heparin level 0.3-0.7 units/ml Monitor platelets by anticoagulation protocol: Yes   Plan:   Reduce heparin to 1500 units/hr  Check heparin level in ~8h d/t renal function  Daily CBC and heparin level  Await plan for long-term anticoagulation (follow renal function if decision made for DOAC)  Juliette Alcideustin Tyneisha Hegeman, PharmD, BCPS.   Pager: [phone] 06/08/2016 3:31 PM

## 2016-06-08 NOTE — Progress Notes (Signed)
PROGRESS NOTE                                                                                                                                                                                                             Patient Demographics:    Kerry Liu, is a 65 y.o. female, DOB - 18-Apr-1951, ZOX:[id number]  Admit date - 06/08/2016   Admitting Physician Eddie North, MD  Outpatient Primary MD for the patient is Nadean Corwin, MD  LOS - 0  Outpatient Specialists: none  Chief Complaint  Patient presents with  . Shortness of Breath       Brief Narrative   65 y.o. morbidly obese female with medical history significant of DVT, PE, ( after hip surgery in 10/2012) s/p of IVC placement in 2014 ( unable to remove it due to body habitus) hypertension, hyperlipidemia, OSA on CPAP, right bundle blockage, morbid obesity, who presents with 1 day history of shortness of breath. Pt tachycardic and hypotensive with elevated tropinin , AKI and  found to have submassive PE on CT angio chest with rt heart strain. Admitted to ICU.    Subjective:   Pt denies further shortness of breath(Has not been out of bed)   Assessment  & Plan :    Principal Problem:   Recurrent pulmonary emboli (HCC) No clear etiology. No recent travel or illness. Reports being active as tolerated. Has IVC filter in place. Doppler LE negative for DVT. On IV heparin. Low BP on monitor but likely error due to her cuff size. Cuff changed and BP stable when hand kept stationary. 2D echo echo with normal EF and no RV strain.hlding off on thrombolytics for now. pulm discussing possible MRV to identify source of emboli. IV heparin. Needs lifelong anticoagulation.  Active Problems:  Elevate troponin  suspect demand ischemia 2/2 submassive PE. No chest pain symptoms or EKG changes. Echo with normal EF and no wall motion abnormality.  Chronic diastolic CHF  (congestive heart failure): pt has 1+ leg edema, but no pulmonary edema on chest x-ray, CHF seems to be compensated. 2-D echo on 11/22/12 showed EF 60% with grade 1 diastolic dysfunction. -Hold Lasix due to worsening renal function -continue aspirin and metoprolol  Hypotension Possibly due to inappropriate size scuff. Stable on manual read. Hold off BP meds    AKI:   Likely  due to prerenal secondary to dehydration and continuation of ARB and diruetics - Check FeUrea - Hold Lasix and Cozaar  OSA: -CPAP   Code Status : full code  Family Communication  : daughter at bedside  Disposition Plan  : home once improved  Barriers For Discharge : active symptoms  Consults  :  PCCM  Procedures  :  CT angio chest  echo  DVT Prophylaxis  :  IV heparin  Lab Results  Component Value Date   PLT 207 06/08/2016    Antibiotics  :    Anti-infectives    None        Objective:   Vitals:   06/08/16 1129 06/08/16 1510 06/08/16 1530 06/08/16 1600  BP: 92/55 92/60 99/63  (!) 79/41  Pulse: 116 84 87 87  Resp: 24 17 (!) 21 (!) 23  Temp:      TempSrc:      SpO2: 93% 95% 96% 96%  Weight:  (!) 150.4 kg (331 lb 9.2 oz)    Height:  5\' 2"  (1.575 m)      Wt Readings from Last 3 Encounters:  06/08/16 (!) 150.4 kg (331 lb 9.2 oz)  05/05/16 (!) 147 kg (324 lb)  04/14/16 (!) 152.4 kg (336 lb)     Intake/Output Summary (Last 24 hours) at 06/08/16 1620 Last data filed at 06/08/16 1600  Gross per 24 hour  Intake               32 ml  Output                1 ml  Net               31 ml     Physical Exam  Gen: not in distress HEENT:  moist mucosa, supple neck Chest: Diminished breath sounds due to body habitus CVS: S1&S2 tachycardic,no murmurs, rubs or gallop GI: soft, NT, ND,  Musculoskeletal: warm, no edema CNS: alert and oriented    Data Review:    CBC  Recent Labs Lab 06/08/16 0215  WBC 10.4  HGB 13.0  HCT 41.1  PLT 207  MCV 91.1  MCH 28.8  MCHC 31.6  RDW  13.1  LYMPHSABS 1.1  MONOABS 0.8  EOSABS 0.1  BASOSABS 0.0    Chemistries   Recent Labs Lab 06/08/16 0215  NA 138  K 4.3  CL 105  CO2 25  GLUCOSE 242*  BUN 37*  CREATININE 1.91*  CALCIUM 9.1  AST 22  ALT 25  ALKPHOS 113  BILITOT 0.9   ------------------------------------------------------------------------------------------------------------------ No results for input(s): CHOL, HDL, LDLCALC, TRIG, CHOLHDL, LDLDIRECT in the last 72 hours.  Lab Results  Component Value Date   HGBA1C 5.8 (H) 03/01/2016   ------------------------------------------------------------------------------------------------------------------ No results for input(s): TSH, T4TOTAL, T3FREE, THYROIDAB in the last 72 hours.  Invalid input(s): FREET3 ------------------------------------------------------------------------------------------------------------------ No results for input(s): VITAMINB12, FOLATE, FERRITIN, TIBC, IRON, RETICCTPCT in the last 72 hours.  Coagulation profile No results for input(s): INR, PROTIME in the last 168 hours.  No results for input(s): DDIMER in the last 72 hours.  Cardiac Enzymes  Recent Labs Lab 06/08/16 0551 06/08/16 1103  TROPONINI 0.62* 0.62*   ------------------------------------------------------------------------------------------------------------------    Component Value Date/Time   BNP 35.9 06/08/2016 0551   BNP 184.7 (H) 04/14/2016 1714    Inpatient Medications  Scheduled Meds: . aspirin EC  81 mg Oral Daily  . atorvastatin  40 mg Oral q1800  . cholecalciferol  5,000 Units Oral Daily  .  fluticasone  1 spray Each Nare Daily  . magnesium oxide  400 mg Oral Daily  . metoprolol  50 mg Oral BID  . minoxidil  2.5 mg Oral Daily  . sodium chloride flush  3 mL Intravenous Q12H   Continuous Infusions: . heparin 1,500 Units/hr (06/08/16 1530)   PRN Meds:.acetaminophen, albuterol, hydrALAZINE, ondansetron **OR** ondansetron (ZOFRAN) IV,  zolpidem  Micro Results No results found for this or any previous visit (from the past 240 hour(s)).  Radiology Reports Dg Chest 2 View  Result Date: 06/08/2016 CLINICAL DATA:  65 y/o  F; shortness of breath. EXAM: CHEST  2 VIEW COMPARISON:  04/19/2016 chest radiograph FINDINGS: Low lung volumes. Stable cardiomegaly given projection and technique. No focal consolidation. No pleural effusion. Multilevel degenerative changes of thoracic spine. IVC filter. Upper abdominal surgical clips, presumably cholecystectomy. IMPRESSION: Stable cardiomegaly. Low lung volumes. No acute pulmonary process identified. Electronically Signed   By: Mitzi HansenLance  Furusawa-Stratton M.D.   On: 06/08/2016 02:46   Ct Angio Chest Pe W And/or Wo Contrast  Result Date: 06/08/2016 CLINICAL DATA:  [age] y/o F; dyspnea and hypoxemia with history of pulmonary embolus. EXAM: CT ANGIOGRAPHY CHEST WITH CONTRAST TECHNIQUE: Multidetector CT imaging of the chest was performed using the standard protocol during bolus administration of intravenous contrast. Multiplanar CT image reconstructions and MIPs were obtained to evaluate the vascular anatomy. CONTRAST:  80 cc Isovue 370 COMPARISON:  CT angiogram of the chest dated 11/21/2012. FINDINGS: Cardiovascular: Satisfactory opacification of the pulmonary arteries to the segmental level. Saddle pulmonary embolus extending into multiple segmental pulmonary arteries. RV/LV equals 1.2. Moderate cardiomegaly. No pericardial effusion. Normal caliber thoracic aorta. Mild aortic atherosclerosis with arch calcifications. Enlarged main pulmonary artery measuring 37 mm indicating pulmonary artery hypertension. Mediastinum/Nodes: No enlarged mediastinal, hilar, or axillary lymph nodes. Thyroid gland, trachea, and esophagus demonstrate no significant findings. Lungs/Pleura: Stable 3 mm right middle lobe nodule (series 7, image 42), no additional recommendations for follow-up. No consolidation of the lungs. No pleural  effusion. Upper Abdomen: No acute abnormality. Musculoskeletal: No chest wall abnormality. No acute or significant osseous findings. Review of the MIP images confirms the above findings. IMPRESSION: Positive for acute PE with saddle pulmonary embolus and CTevidence of right heart strain (RV/LV Ratio = 1.2) consistent with at least submassive (intermediate risk) PE. The presence of right heart strain has been associated with an increased risk of morbidity and mortality. These results were called by telephone at the time of interpretation on 06/08/2016 at 4:19 am to Dr. Alona BeneJOSHUA LONG , who verbally acknowledged these results. Electronically Signed   By: Mitzi HansenLance  Furusawa-Stratton M.D.   On: 06/08/2016 04:21    Time Spent in minutes  20   Eddie NorthHUNGEL, Daysen Gundrum M.D on 06/08/2016 at 4:20 PM  Between 7am to 7pm - Pager - [phone]  After 7pm go to [email or web address] - password [email or web address]  [facility] -  Office  [phone]

## 2016-06-08 NOTE — Progress Notes (Signed)
  Echocardiogram 2D Echocardiogram with Definity has been performed.  Kerry Liu, Kerry Liu 06/08/2016, 9:52 AM

## 2016-06-08 NOTE — ED Notes (Signed)
Patient is going to bed 1233. She can go up at 2:30 pm

## 2016-06-08 NOTE — ED Notes (Signed)
MD notified of Troponin of 0.43 at this time.

## 2016-06-08 NOTE — ED Notes (Signed)
Admitting at bedside 

## 2016-06-08 NOTE — Progress Notes (Addendum)
VASCULAR LAB PRELIMINARY  PRELIMINARY  PRELIMINARY  PRELIMINARY  Bilateral lower extremity venous duplex completed.    Preliminary report:  Bilateral:  No obvious evidence of DVT, superficial thrombosis, or Baker's Cyst.   Kerry Liu, RVS 06/08/2016, 9:48 AM

## 2016-06-08 NOTE — ED Provider Notes (Signed)
Emergency Department Provider Note   I have reviewed the triage vital signs and the nursing notes.  By signing my name below, I, Nelwyn Salisbury, attest that this documentation has been prepared under the direction and in the presence of Maia Plan, MD . Electronically Signed: Nelwyn Salisbury, Scribe. 06/08/2016. 2:09 AM.  HISTORY  Chief Complaint Shortness of Breath   HPI Kerry Liu is a 65 y.o. female with pmhx of PE who presents to the Emergency Department complaining of sudden-onset intermittent shortness of breath beginnings yesterday afternoon. Pt describes her symptoms as being exacerbated on minimal exertion and alleviated by rest. She reports associated cough. Pt denies any chest pain or other symptoms. She is compliant with a daily baby aspirin.   Past Medical History:  Diagnosis Date  . Acute meniscal tear of knee    RIGHT  . Arthritis    "right knee" (11/21/2012)  . Elevated hemoglobin A1c   . Exertional shortness of breath    "this week" (11/21/2012)  . History of DVT (deep vein thrombosis)    IN PREGNANCY  . HTN (hypertension)    CARDIOLOGIST--  DR HOCHREIN-- CLEARANCE NOTE IN EPIC AND CHART FROM 08-21-2012  . Mixed hyperlipidemia   . Obesity   . Pulmonary embolism (HCC)    "3 today" (11/21/2012)  . RBBB (right bundle branch block with left anterior fascicular block)   . Seasonal allergies   . Sinus tachycardia   . Sleep apnea   . Vitamin D deficiency     Patient Active Problem List   Diagnosis Date Noted  . OSA on CPAP 06/08/2016  . AKI (acute kidney injury) (HCC) 06/08/2016  . PE (pulmonary thromboembolism) (HCC) 06/08/2016  . Chronic diastolic CHF (congestive heart failure) (HCC) 06/08/2016  . Elevated troponin 06/08/2016  . 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 Medication management 07/10/2015  . Mixed hyperlipidemia   . Seasonal allergies   . Prediabetes   . Morbid obesity (HCC)   . Vitamin D deficiency   . History of DVT (deep  vein thrombosis)   . Pulmonary embolism (HCC)   . Recurrent pulmonary emboli (HCC) 11/21/2012  . Osteoarthritis of right knee 10/05/2012  . Essential hypertension 03/25/2009    Past Surgical History:  Procedure Laterality Date  . IVC filter placed in May 2014 N/A   . KNEE ARTHROSCOPY Right 12-09-2002  . KNEE ARTHROSCOPY WITH MEDIAL MENISECTOMY Right 10/05/2012   Procedure: KNEE ARTHROSCOPY WITH MEDIAL MENISECTOMY;  Surgeon: Jacki Cones, MD;  Location: [facility] [place];  Service: Orthopedics;  Laterality: Right;  . TOTAL ABDOMINAL HYSTERECTOMY W/ BILATERAL SALPINGOOPHORECTOMY  ~ 1986   W/ BILATERAL SALPINGOOPHORECTOMY  . TRANSTHORACIC ECHOCARDIOGRAM  11-17-2010   MILD LVH/ EF 60-65%/ GRADE I DIASTOLIC DYSFUNCTION/ MILD LAE / MILD RAE    Current Outpatient Rx  . Order #: [id number] Class: Historical Med  . Order #: [id number] Class: OTC  . Order #: [id number] Class: Normal  . Order #: [id number] Class: Historical Med  . Order #: [id number] Class: Normal  . Order #: [id number] Class: Normal  . Order #: [id number] Class: Historical Med  . Order #: [id number] Class: Normal  . Order #: [id number] Class: Normal  . Order #: [id number] Class: Normal  . Order #: [id number] Class: Historical Med    Allergies Codeine; Hydrocodone; Oxycodone; and Tramadol  Family History  Problem Relation Age of Onset  . Heart attack    . Heart attack Father   . Hypertension Brother   . Diabetes Brother   . Stroke Brother   .  Sudden death Brother     Social History Social History  Substance Use Topics  . Smoking status: Never Smoker  . Smokeless tobacco: Never Used  . Alcohol use No    Review of Systems  Constitutional: No fever/chills Eyes: No visual changes. ENT: No sore throat. Cardiovascular: Denies chest pain. Respiratory: Shortness of breath. Cough. Gastrointestinal: No abdominal pain.  No nausea, no vomiting.  No diarrhea.  No constipation. Genitourinary: Negative for  dysuria. Musculoskeletal: Negative for back pain. Skin: Negative for rash. Neurological: Negative for headaches, focal weakness or numbness.  10-point ROS otherwise negative.  ____________________________________________   PHYSICAL EXAM:  VITAL SIGNS: ED Triage Vitals [06/08/16 0157]  Enc Vitals Group     BP 107/77     Pulse      Resp (!) 30     Temp 97.9 F (36.6 C)     Temp Source Oral     SpO2 (!) 86 %   Constitutional: Alert and oriented. Appears slightly dyspneic.  Eyes: Conjunctivae are normal.  Head: Atraumatic. Nose: No congestion/rhinnorhea. Mouth/Throat: Mucous membranes are moist.  Oropharynx non-erythematous. Neck: No stridor.  Cardiovascular: Tachycardia. Good peripheral circulation. Grossly normal heart sounds.   Respiratory: Increased respiratory effort.  No retractions. Lungs CTAB. Gastrointestinal: Soft and nontender. Obese. No distention.  Musculoskeletal: No lower extremity tenderness nor edema. No gross deformities of extremities. Neurologic:  Normal speech and language. No gross focal neurologic deficits are appreciated.  Skin:  Skin is warm, dry and intact. No rash noted.  ____________________________________________   LABS (all labs ordered are listed, but only abnormal results are displayed)  Labs Reviewed  COMPREHENSIVE METABOLIC PANEL - Abnormal; Notable for the following:       Result Value   Glucose, Bld 242 (*)    BUN 37 (*)    Creatinine, Ser 1.91 (*)    GFR calc non Af Amer 27 (*)    GFR calc Af Amer 31 (*)    All other components within normal limits  CBC WITH DIFFERENTIAL/PLATELET - Abnormal; Notable for the following:    Neutro Abs 8.4 (*)    All other components within normal limits  I-STAT TROPOININ, ED - Abnormal; Notable for the following:    Troponin i, poc 0.43 (*)    All other components within normal limits  LIPASE, BLOOD  HEPARIN LEVEL (UNFRACTIONATED)  CREATININE, URINE, RANDOM  UREA NITROGEN, URINE  BRAIN  NATRIURETIC PEPTIDE  TROPONIN I  TROPONIN I  TROPONIN I   ____________________________________________  EKG   EKG Interpretation  Date/Time:  Wednesday June 08 2016 04:18:18 EST Ventricular Rate:  137 PR Interval:    QRS Duration: 135 QT Interval:  330 QTC Calculation: 499 R Axis:   -80 Text Interpretation:  Sinus tachycardia RBBB and LAFB Left ventricular hypertrophy Anterolateral infarct, age indeterminate No STEMI. Similar to prior.  Confirmed by LONG MD, JOSHUA [phone]([id number]) on 06/08/2016 5:41:23 AM      ____________________________________________  RADIOLOGY  Dg Chest 2 View  Result Date: 06/08/2016 CLINICAL DATA:  65 y/o  F; shortness of breath. EXAM: CHEST  2 VIEW COMPARISON:  04/19/2016 chest radiograph FINDINGS: Low lung volumes. Stable cardiomegaly given projection and technique. No focal consolidation. No pleural effusion. Multilevel degenerative changes of thoracic spine. IVC filter. Upper abdominal surgical clips, presumably cholecystectomy. IMPRESSION: Stable cardiomegaly. Low lung volumes. No acute pulmonary process identified. Electronically Signed   By: Mitzi HansenLance  Furusawa-Stratton M.D.   On: 06/08/2016 02:46   Ct Angio Chest Pe W And/or Wo Contrast  Result Date: 06/08/2016 CLINICAL DATA:  65 y/o F; dyspnea and hypoxemia with history of pulmonary embolus. EXAM: CT ANGIOGRAPHY CHEST WITH CONTRAST TECHNIQUE: Multidetector CT imaging of the chest was performed using the standard protocol during bolus administration of intravenous contrast. Multiplanar CT image reconstructions and MIPs were obtained to evaluate the vascular anatomy. CONTRAST:  80 cc Isovue 370 COMPARISON:  CT angiogram of the chest dated 11/21/2012. FINDINGS: Cardiovascular: Satisfactory opacification of the pulmonary arteries to the segmental level. Saddle pulmonary embolus extending into multiple segmental pulmonary arteries. RV/LV equals 1.2. Moderate cardiomegaly. No pericardial effusion. Normal caliber  thoracic aorta. Mild aortic atherosclerosis with arch calcifications. Enlarged main pulmonary artery measuring 37 mm indicating pulmonary artery hypertension. Mediastinum/Nodes: No enlarged mediastinal, hilar, or axillary lymph nodes. Thyroid gland, trachea, and esophagus demonstrate no significant findings. Lungs/Pleura: Stable 3 mm right middle lobe nodule (series 7, image 42), no additional recommendations for follow-up. No consolidation of the lungs. No pleural effusion. Upper Abdomen: No acute abnormality. Musculoskeletal: No chest wall abnormality. No acute or significant osseous findings. Review of the MIP images confirms the above findings. IMPRESSION: Positive for acute PE with saddle pulmonary embolus and CTevidence of right heart strain (RV/LV Ratio = 1.2) consistent with at least submassive (intermediate risk) PE. The presence of right heart strain has been associated with an increased risk of morbidity and mortality. These results were called by telephone at the time of interpretation on 06/08/2016 at 4:19 am to Dr. Alona BeneJOSHUA LONG , who verbally acknowledged these results. Electronically Signed   By: Mitzi HansenLance  Furusawa-Stratton M.D.   On: 06/08/2016 04:21    ____________________________________________   PROCEDURES  Procedure(s) performed:   Procedures  CRITICAL CARE Performed by: Maia PlanJoshua G Long Total critical care time: 40 minutes Critical care time was exclusive of separately billable procedures and treating other patients. Critical care was necessary to treat or prevent imminent or life-threatening deterioration. Critical care was time spent personally by me on the following activities: development of treatment plan with patient and/or surrogate as well as nursing, discussions with consultants, evaluation of patient's response to treatment, examination of patient, obtaining history from patient or surrogate, ordering and performing treatments and interventions, ordering and review of  laboratory studies, ordering and review of radiographic studies, pulse oximetry and re-evaluation of patient's condition.  Alona BeneJoshua Long, MD Emergency Medicine   ____________________________________________   INITIAL IMPRESSION / ASSESSMENT AND PLAN / ED COURSE  Pertinent labs & imaging results that were available during my care of the patient were reviewed by me and considered in my medical decision making (see chart for details).  Patient with past medical history of pulmonary embolism presents to the emergency department for evaluation of dyspnea. She has hypoxemia on arrival and tachycardia. Her EKG is similar to prior with the exception of tachycardia. Troponin is initially elevated but my suspicion for pulmonary embolism is high in this patient. Discussed the case with Cardiology who agrees with waiting for CTA results. Heparin bolus given pending scan. Discussed with the patient that she has an AKI and that contrast could make this kidney injury worse but that I am very concerned regarding a potentially life-threatening diagnosis, PE, and that the risk of kidney injury is outweighed by the benefit of recognizing large PE.   04:30 AM Spoke with intensive care regarding the patient's large PE burden and evidence of heart strain on CT. The patient remains tachycardic but with normal blood pressures. The patient is feeling somewhat better. They request hospitalist admission to stepdown. They  will send someone for consultation and consideration of catheter directed tPA.   Discussed patient's case with hospitalist, Dr. Clyde Lundborg.  Recommend admission to inpatient, stepdown bed.  I will place holding orders per their request. Patient and family (if present) updated with plan. Care transferred to hospitalist service.  I reviewed all nursing notes, vitals, pertinent old records, EKGs, labs, imaging (as available).  ____________________________________________  FINAL CLINICAL IMPRESSION(S) / ED  DIAGNOSES  Final diagnoses:  PE (pulmonary thromboembolism) (HCC)     MEDICATIONS GIVEN DURING THIS VISIT:  Medications  iopamidol (ISOVUE-300) 61 % injection (not administered)  sodium chloride 0.9 % injection (not administered)  iopamidol (ISOVUE-370) 76 % injection (not administered)  heparin ADULT infusion 100 units/mL (25000 units/242mL sodium chloride 0.45%) (1,700 Units/hr Intravenous New Bag/Given 06/08/16 0509)  albuterol (PROVENTIL) (2.5 MG/3ML) 0.083% nebulizer solution 5 mg (not administered)  aspirin EC tablet 81 mg (not administered)  minoxidil (LONITEN) tablet 2.5 mg (not administered)  metoprolol tartrate (LOPRESSOR) tablet 50 mg (not administered)  atorvastatin (LIPITOR) tablet 40 mg (not administered)  fluticasone (FLONASE) 50 MCG/ACT nasal spray 1 spray (not administered)  acetaminophen (TYLENOL) tablet 650 mg (not administered)  cholecalciferol (VITAMIN D) tablet 5,000 Units (not administered)  magnesium oxide (MAG-OX) tablet 400 mg (not administered)  hydrALAZINE (APRESOLINE) injection 5 mg (not administered)  sodium chloride flush (NS) 0.9 % injection 3 mL (not administered)  ondansetron (ZOFRAN) tablet 4 mg (not administered)    Or  ondansetron (ZOFRAN) injection 4 mg (not administered)  zolpidem (AMBIEN) tablet 5 mg (not administered)  albuterol (PROVENTIL) (2.5 MG/3ML) 0.083% nebulizer solution 5 mg (5 mg Nebulization Given 06/08/16 0252)  sodium chloride 0.9 % bolus 500 mL (0 mLs Intravenous Stopped 06/08/16 0339)  aspirin chewable tablet 324 mg (324 mg Oral Given 06/08/16 0321)  heparin injection 4,000 Units (4,000 Units Intravenous Given 06/08/16 0337)  sodium chloride 0.9 % bolus 500 mL (0 mLs Intravenous Stopped 06/08/16 0509)  iopamidol (ISOVUE-370) 76 % injection 100 mL (80 mLs Intravenous Contrast Given 06/08/16 0355)     NEW OUTPATIENT MEDICATIONS STARTED DURING THIS VISIT:  None   Note:  This document was prepared using Dragon voice  recognition software and may include unintentional dictation errors.  Alona Bene, MD Emergency Medicine  I personally performed the services described in this documentation, which was scribed in my presence. The recorded information has been reviewed and is accurate.       Maia Plan, MD 06/08/16 [phone]

## 2016-06-08 NOTE — ED Notes (Signed)
Lab called with critical value: troponin 0.62; Dr Jacqulyn BathLong notified

## 2016-06-08 NOTE — Progress Notes (Signed)
ANTICOAGULATION CONSULT NOTE - Initial Consult  Pharmacy Consult for Heparin Indication: chest pain/ACS and pulmonary embolus  Allergies  Allergen Reactions  . Codeine Other (See Comments)    Unknown  . Hydrocodone     Itch  . Oxycodone Rash  . Tramadol Rash    Patient Measurements: Height: 5\' 2"  (157.5 cm) Weight: (!) 324 lb (147 kg) IBW/kg (Calculated) : 50.1 Heparin Dosing Weight:   Vital Signs: Temp: 97.9 F (36.6 C) (12/13 0157) Temp Source: Oral (12/13 0157) BP: 117/67 (12/13 0324) Pulse Rate: 129 (12/13 0324)  Labs:  Recent Labs  06/08/16 0215  HGB 13.0  HCT 41.1  PLT 207  CREATININE 1.91*    Estimated Creatinine Clearance: 42.3 mL/min (by C-G formula based on SCr of 1.91 mg/dL (H)).   Medical History: Past Medical History:  Diagnosis Date  . Acute meniscal tear of knee    RIGHT  . Arthritis    "right knee" (11/21/2012)  . Elevated hemoglobin A1c   . Exertional shortness of breath    "this week" (11/21/2012)  . History of DVT (deep vein thrombosis)    IN PREGNANCY  . HTN (hypertension)    CARDIOLOGIST--  DR HOCHREIN-- CLEARANCE NOTE IN EPIC AND CHART FROM 08-21-2012  . Mixed hyperlipidemia   . Obesity   . Pulmonary embolism (HCC)    "3 today" (11/21/2012)  . RBBB (right bundle branch block with left anterior fascicular block)   . Seasonal allergies   . Sinus tachycardia   . Sleep apnea   . Vitamin D deficiency     Medications:  Infusions:  . heparin      Assessment: Patient in ED with SOB.  MD wants heparin for PE, prior heparin bolus for ACS ordered due to + Troponin.    Goal of Therapy:  Heparin level 0.3-0.7 units/ml Monitor platelets by anticoagulation protocol: Yes   Plan:  Heparin bolus 4000  units iv x1--given prior Heparin drip at 1700 units/hr Daily CBC Next heparin level at 1400    [id number]Kerry Liu, Kerry Liu 06/08/2016,4:48 AM

## 2016-06-08 NOTE — Consult Note (Signed)
Name: Kerry Liu MRN: [id number] DOB: 06/15/1951    ADMISSION DATE:  06/08/2016 CONSULTATION DATE:  12/13  REFERRING MD :  Dr. Clyde LundborgNiu  CHIEF COMPLAINT:  DOE  BRIEF PATIENT DESCRIPTION: 65 year old morbidly obese female presenting with dyspnea and history of PE. She is relatively immobile at baseline. Spends most of day in recliner. CTA positive for bilateral and possibly saddle PE with evidence of RV strain. PCCM asked to evaluate.  SIGNIFICANT EVENTS    STUDIES:  CTA 12/13 > Positive for acute PE with saddle pulmonary embolus and CT evidence of right heart strain (RV/LV Ratio = 1.2)   HISTORY OF PRESENT ILLNESS:  65 year old female with PMH as below, which is significant for PE in 2014 (treated with Xarelto for one year), HTN, HLD, OSA on CPAP 9cmH2O, and morbid obesity. She has had dyspnea on exertion for some time and presented to her PCP for this several months ago along with leg swelling. She had CXR with no acute disease and was started on diuretic with some beneft. She has been largely immobilized due to these symptoms and spends most of her day in a recliner. Symptoms were relatively stable until 12/12 when she had a sudden worsening. [facility] with marked SOB. She underwent CTA chest and was found to have bilateral PE with evidence of RV strain. She was started on heparin infusion and PCCM was asked to evaluate. Of note she had IVC filter placed last admit, which she claims has never been retrieved.   PAST MEDICAL HISTORY :   has a past medical history of Acute meniscal tear of knee; Arthritis; Elevated hemoglobin A1c; Exertional shortness of breath; History of DVT (deep vein thrombosis); HTN (hypertension); Mixed hyperlipidemia; Obesity; Pulmonary embolism (HCC); RBBB (right bundle branch block with left anterior fascicular block); Seasonal allergies; Sinus tachycardia; Sleep apnea; and Vitamin D deficiency.  has a past surgical history that includes Knee  arthroscopy (Right, 12-09-2002); transthoracic echocardiogram (11-17-2010); Total abdominal hysterectomy w/ bilateral salpingoophorectomy (~ 1986); Knee arthroscopy with medial menisectomy (Right, 10/05/2012); and IVC filter placed in May 2014 (N/A). Prior to Admission medications   Medication Sig Start Date End Date Taking? Authorizing Provider  acetaminophen (TYLENOL) 500 MG tablet Take 500 mg by mouth every 6 (six) hours as needed for pain.   Yes Historical Provider, MD  aspirin EC 81 MG tablet Take 1 tablet (81 mg total) by mouth daily. 03/30/16 03/30/17 Yes Lucky CowboyWilliam McKeown, MD  atorvastatin (LIPITOR) 80 MG tablet Take 1/2 to 1 tablet daily or as directed for Cholesterol 07/10/15  Yes Lucky CowboyWilliam McKeown, MD  Cholecalciferol (VITAMIN D3) 5000 UNITS CAPS Take 1 capsule by mouth daily.   Yes Historical Provider, MD  furosemide (LASIX) 40 MG tablet Take 1 tablet 2 x/ day for BP & Fluid 03/01/16 08/29/16 Yes Lucky CowboyWilliam McKeown, MD  losartan (COZAAR) 100 MG tablet Take 1 tablet daily for BP 03/01/16 08/29/16 Yes Lucky CowboyWilliam McKeown, MD  Magnesium Oxide 250 MG TABS Take 1 tablet by mouth daily.    Yes Historical Provider, MD  metoprolol (LOPRESSOR) 50 MG tablet TAKE ONE TABLET BY MOUTH TWICE DAILY 11/01/15  Yes Lucky CowboyWilliam McKeown, MD  minoxidil (LONITEN) 10 MG tablet Take 1/2 to 1 tablet daily as directed for BP 03/30/16 09/28/16 Yes Lucky CowboyWilliam McKeown, MD  mometasone (NASONEX) 50 MCG/ACT nasal spray Place 2 sprays into the nose daily. 09/04/14 06/08/16 Yes Courtney Forcucci, PA-C  PROAIR HFA 108 (90 Base) MCG/ACT inhaler Inhale 1-2 puffs  into the lungs every 4 (four) hours as needed for shortness of breath.  06/12/15  Yes Historical Provider, MD   Allergies  Allergen Reactions  . Codeine Other (See Comments)    Unknown  . Hydrocodone     Itch  . Oxycodone Rash  . Tramadol Rash    FAMILY HISTORY:  family history includes Diabetes in her brother; Heart attack in her father; Hypertension in her brother; Stroke in her brother;  Sudden death in her brother. SOCIAL HISTORY:  reports that she has never smoked. She has never used smokeless tobacco. She reports that she does not drink alcohol or use drugs.  REVIEW OF SYSTEMS:   Bolds are positive  Constitutional: weight loss, gain, night sweats, Fevers, chills, fatigue .  HEENT: headaches, Sore throat, sneezing, nasal congestion, post nasal drip, Difficulty swallowing, Tooth/dental problems, visual complaints visual changes, ear ache CV:  chest pain, radiates: ,Orthopnea, PND, swelling in lower extremities, dizziness, palpitations, syncope.  GI  heartburn, indigestion, abdominal pain, nausea, vomiting, diarrhea, change in bowel habits, loss of appetite, bloody stools.  Resp: cough, productive: , hemoptysis, dyspnea, chest pain, pleuritic.  Skin: rash or itching or icterus GU: dysuria, change in color of urine, urgency or frequency. flank pain, hematuria  MS: joint pain or swelling. decreased range of motion  Psych: change in mood or affect. depression or anxiety.  Neuro: difficulty with speech, weakness, numbness, ataxia    SUBJECTIVE:   VITAL SIGNS: Temp:  [97.9 F (36.6 C)] 97.9 F (36.6 C) (12/13 0157) Pulse Rate:  [129-134] 134 (12/13 0500) Resp:  [20-30] 26 (12/13 0500) BP: (107-117)/(67-77) 112/70 (12/13 0500) SpO2:  [86 %-100 %] 93 % (12/13 0500) Weight:  [147 kg (324 lb)-147 kg (324 lb 1.2 oz)] 147 kg (324 lb) (12/13 0322)  PHYSICAL EXAMINATION: General:  Morbidly obese female in NAD on 2L [place] Neuro:  Alert, oriented, non-focal HEENT:  [place]/AT, PERRL, difficult to discern JVD due to neck girth Cardiovascular:  Tachy, regular, no MRG, difficult to determine extent of lower extremity edema.  Lungs:  Clear, distant Abdomen:  Obese, soft, non-tender Musculoskeletal:  No acute defomirty Skin:  Grossly intact   Recent Labs Lab 06/08/16 0215  NA 138  K 4.3  CL 105  CO2 25  BUN 37*  CREATININE 1.91*  GLUCOSE 242*    Recent Labs Lab  06/08/16 0215  HGB 13.0  HCT 41.1  WBC 10.4  PLT 207   Dg Chest 2 View  Result Date: 06/08/2016 CLINICAL DATA:  65 y/o  F; shortness of breath. EXAM: CHEST  2 VIEW COMPARISON:  04/19/2016 chest radiograph FINDINGS: Low lung volumes. Stable cardiomegaly given projection and technique. No focal consolidation. No pleural effusion. Multilevel degenerative changes of thoracic spine. IVC filter. Upper abdominal surgical clips, presumably cholecystectomy. IMPRESSION: Stable cardiomegaly. Low lung volumes. No acute pulmonary process identified. Electronically Signed   By: Mitzi Hansen M.D.   On: 06/08/2016 02:46   Ct Angio Chest Pe W And/or Wo Contrast  Result Date: 06/08/2016 CLINICAL DATA:  65 y/o F; dyspnea and hypoxemia with history of pulmonary embolus. EXAM: CT ANGIOGRAPHY CHEST WITH CONTRAST TECHNIQUE: Multidetector CT imaging of the chest was performed using the standard protocol during bolus administration of intravenous contrast. Multiplanar CT image reconstructions and MIPs were obtained to evaluate the vascular anatomy. CONTRAST:  80 cc Isovue 370 COMPARISON:  CT angiogram of the chest dated 11/21/2012. FINDINGS: Cardiovascular: Satisfactory opacification of the pulmonary arteries to the segmental level. Saddle pulmonary embolus extending  into multiple segmental pulmonary arteries. RV/LV equals 1.2. Moderate cardiomegaly. No pericardial effusion. Normal caliber thoracic aorta. Mild aortic atherosclerosis with arch calcifications. Enlarged main pulmonary artery measuring 37 mm indicating pulmonary artery hypertension. Mediastinum/Nodes: No enlarged mediastinal, hilar, or axillary lymph nodes. Thyroid gland, trachea, and esophagus demonstrate no significant findings. Lungs/Pleura: Stable 3 mm right middle lobe nodule (series 7, image 42), no additional recommendations for follow-up. No consolidation of the lungs. No pleural effusion. Upper Abdomen: No acute abnormality. Musculoskeletal:  No chest wall abnormality. No acute or significant osseous findings. Review of the MIP images confirms the above findings. IMPRESSION: Positive for acute PE with saddle pulmonary embolus and CTevidence of right heart strain (RV/LV Ratio = 1.2) consistent with at least submassive (intermediate risk) PE. The presence of right heart strain has been associated with an increased risk of morbidity and mortality. These results were called by telephone at the time of interpretation on 06/08/2016 at 4:19 am to Dr. Alona BeneJOSHUA LONG , who verbally acknowledged these results. Electronically Signed   By: Mitzi HansenLance  Furusawa-Stratton M.D.   On: 06/08/2016 04:21    ASSESSMENT / PLAN:  Recurrent pulmonary embolism - currently she is comfortable and stable on 2L [place]. No complaints at this time. Some evidence of R heart strain on CT. Poor quality study so will want to confirm this with Echo prior to making further decisions on plan of care. - Heparin per pharmacy - Telemetry - Lifelong anticoagulation at this point. Has used Xarelto in past. - Echocardiogram - Lower extremity dopplers - ? Filter still in place, seems unlikely. - Hold off on lytics of any kind at this point pending further eval as outlined above   OSA/OHS on CPAP - Nocturnal CPAP 9cmH2O while inpatient as per home regimen.  Joneen RoachPaul Elward Nocera, AGACNP-BC [facility]/Critical Care Pager [phone] or [phone]  06/08/2016 5:37 AM

## 2016-06-08 NOTE — ED Notes (Signed)
Pt is aware urine sample is needed. 

## 2016-06-08 NOTE — H&P (Addendum)
History and Physical    [name]:[id number] DOB: [birth year] DOA: 06/08/2016  Referring MD/NP/PA:   PCP: Nadean Corwin, MD   Patient coming from:  The patient is coming from home.  At baseline, pt is independent for most of ADL.   Chief Complaint: SOB  HPI: Kerry Liu is a 65 y.o. female with medical history significant of DVT, PE, s/p of IVC placement, hypertension, hyperlipidemia, OSA on CPAP, right bundle blockage, morbid obesity, who presents with SOB  Patient and she started having shortness breast today, which has been progressively getting worse. She does not have chest pain. She has a dry cough, no fever or chills. Patient reports she had 3 loose stool bowel movement yesterday, and no new diarrhea after arrival to ED. No nausea, vomiting or abdominal pain. She states that she took antibiotics 3 weeks ago for an infection. Patient does not have symptoms of UTI, unilateral weakness. No tenderness over calf areas.  ED Course: pt was found to have WBC 10.4, troponin 0.43, AKi with cre 1.91, negative chest x-ray, temperature normal, tachycardia, tachypnea, and desaturated to 86% on room air. CTA ofchest showed submassive PE with right heart straining. Pt is admitted to stepdown ICU patient. PCCM was consulted by EDP.  Review of Systems:   General: no fevers, chills, no changes in body weight, has fatigue HEENT: no blurry vision, hearing changes or sore throat Respiratory: has dyspnea, coughing, no wheezing CV: no chest pain, no palpitations GI: no nausea, vomiting, abdominal pain, had diarrhea, no constipation GU: no dysuria, burning on urination, increased urinary frequency, hematuria  Ext: has leg edema Neuro: no unilateral weakness, numbness, or tingling, no vision change or hearing loss Skin: no rash, no skin tear. MSK: No muscle spasm, no deformity, no limitation of range of movement in spin Heme: No easy bruising.  Travel history: No recent long distant  travel.  Allergy:  Allergies  Allergen Reactions  . Codeine Other (See Comments)    Unknown  . Hydrocodone     Itch  . Oxycodone Rash  . Tramadol Rash    Past Medical History:  Diagnosis Date  . Acute meniscal tear of knee    RIGHT  . Arthritis    "right knee" (11/21/2012)  . Elevated hemoglobin A1c   . Exertional shortness of breath    "this week" (11/21/2012)  . History of DVT (deep vein thrombosis)    IN PREGNANCY  . HTN (hypertension)    CARDIOLOGIST--  DR HOCHREIN-- CLEARANCE NOTE IN EPIC AND CHART FROM 08-21-2012  . Mixed hyperlipidemia   . Obesity   . Pulmonary embolism (HCC)    "3 today" (11/21/2012)  . RBBB (right bundle branch block with left anterior fascicular block)   . Seasonal allergies   . Sinus tachycardia   . Sleep apnea   . Vitamin D deficiency     Past Surgical History:  Procedure Laterality Date  . IVC filter placed in May 2014 N/A   . KNEE ARTHROSCOPY Right 12-09-2002  . KNEE ARTHROSCOPY WITH MEDIAL MENISECTOMY Right 10/05/2012   Procedure: KNEE ARTHROSCOPY WITH MEDIAL MENISECTOMY;  Surgeon: Jacki Cones, MD;  [facility];  Service: Orthopedics;  Laterality: Right;  . TOTAL ABDOMINAL HYSTERECTOMY W/ BILATERAL SALPINGOOPHORECTOMY  ~ 1986   W/ BILATERAL SALPINGOOPHORECTOMY  . TRANSTHORACIC ECHOCARDIOGRAM  11-17-2010   MILD LVH/ EF 60-65%/ GRADE I DIASTOLIC DYSFUNCTION/ MILD LAE / MILD RAE    Social History:  reports that she has never  smoked. She has never used smokeless tobacco. She reports that she does not drink alcohol or use drugs.  Family History:  Family History  Problem Relation Age of Onset  . Heart attack    . Heart attack Father   . Hypertension Brother   . Diabetes Brother   . Stroke Brother   . Sudden death Brother      Prior to Admission medications   Medication Sig Start Date End Date Taking? Authorizing Provider  acetaminophen (TYLENOL) 500 MG tablet Take 500 mg by mouth every 6 (six) hours as  needed for pain.   Yes Historical Provider, MD  aspirin EC 81 MG tablet Take 1 tablet (81 mg total) by mouth daily. 03/30/16 03/30/17 Yes Lucky CowboyWilliam McKeown, MD  atorvastatin (LIPITOR) 80 MG tablet Take 1/2 to 1 tablet daily or as directed for Cholesterol 07/10/15  Yes Lucky CowboyWilliam McKeown, MD  Cholecalciferol (VITAMIN D3) 5000 UNITS CAPS Take 1 capsule by mouth daily.   Yes Historical Provider, MD  furosemide (LASIX) 40 MG tablet Take 1 tablet 2 x/ day for BP & Fluid 03/01/16 08/29/16 Yes Lucky CowboyWilliam McKeown, MD  losartan (COZAAR) 100 MG tablet Take 1 tablet daily for BP 03/01/16 08/29/16 Yes Lucky CowboyWilliam McKeown, MD  Magnesium Oxide 250 MG TABS Take 1 tablet by mouth daily.    Yes Historical Provider, MD  metoprolol (LOPRESSOR) 50 MG tablet TAKE ONE TABLET BY MOUTH TWICE DAILY 11/01/15  Yes Lucky CowboyWilliam McKeown, MD  minoxidil (LONITEN) 10 MG tablet Take 1/2 to 1 tablet daily as directed for BP 03/30/16 09/28/16 Yes Lucky CowboyWilliam McKeown, MD  mometasone (NASONEX) 50 MCG/ACT nasal spray Place 2 sprays into the nose daily. 09/04/14 06/08/16 Yes Courtney Forcucci, PA-C  PROAIR HFA 108 (90 Base) MCG/ACT inhaler Inhale 1-2 puffs into the lungs every 4 (four) hours as needed for shortness of breath.  06/12/15  Yes Historical Provider, MD    Physical Exam: Vitals:   06/08/16 0300 06/08/16 0322 06/08/16 0324 06/08/16 0500  BP:   117/67 112/70  Pulse:   (!) 129 (!) 134  Resp:   20 26  Temp:      TempSrc:      SpO2:   94% 93%  Weight: (!) 147 kg (324 lb 1.2 oz) (!) 147 kg (324 lb)    Height:  5\' 2"  (1.575 m)     General: Not in acute distress HEENT:       Eyes: PERRL, EOMI, no scleral icterus.       ENT: No discharge from the ears and nose, no pharynx injection, no tonsillar enlargement.        Neck: Difficult to assess JVD due to morbid obesity, no bruit, no mass felt. Heme: No neck lymph node enlargement. Cardiac: S1/S2, RRR, No murmurs, No gallops or rubs. Respiratory: No rales, wheezing, rhonchi or rubs. GI: Soft, nondistended,  nontender, no rebound pain, no organomegaly, BS present. GU: No hematuria Ext: 1+ pitting leg edema bilaterally. 2+DP/PT pulse bilaterally. Musculoskeletal: No joint deformities, No joint redness or warmth, no limitation of ROM in spin. Skin: No rashes.  Neuro: Alert, oriented X3, cranial nerves II-XII grossly intact, moves all extremities normally.  Psych: Patient is not psychotic, no suicidal or hemocidal ideation.  Labs on Admission: I have personally reviewed following labs and imaging studies  CBC:  Recent Labs Lab 06/08/16 0215  WBC 10.4  NEUTROABS 8.4*  HGB 13.0  HCT 41.1  MCV 91.1  PLT 207   Basic Metabolic Panel:  Recent Labs Lab 06/08/16  0215  NA 138  K 4.3  CL 105  CO2 25  GLUCOSE 242*  BUN 37*  CREATININE 1.91*  CALCIUM 9.1   GFR: Estimated Creatinine Clearance: 42.3 mL/min (by C-G formula based on SCr of 1.91 mg/dL (H)). Liver Function Tests:  Recent Labs Lab 06/08/16 0215  AST 22  ALT 25  ALKPHOS 113  BILITOT 0.9  PROT 7.5  ALBUMIN 3.6    Recent Labs Lab 06/08/16 0215  LIPASE 27   No results for input(s): AMMONIA in the last 168 hours. Coagulation Profile: No results for input(s): INR, PROTIME in the last 168 hours. Cardiac Enzymes: No results for input(s): CKTOTAL, CKMB, CKMBINDEX, TROPONINI in the last 168 hours. BNP (last 3 results) No results for input(s): PROBNP in the last 8760 hours. HbA1C: No results for input(s): HGBA1C in the last 72 hours. CBG: No results for input(s): GLUCAP in the last 168 hours. Lipid Profile: No results for input(s): CHOL, HDL, LDLCALC, TRIG, CHOLHDL, LDLDIRECT in the last 72 hours. Thyroid Function Tests: No results for input(s): TSH, T4TOTAL, FREET4, T3FREE, THYROIDAB in the last 72 hours. Anemia Panel: No results for input(s): VITAMINB12, FOLATE, FERRITIN, TIBC, IRON, RETICCTPCT in the last 72 hours. Urine analysis:    Component Value Date/Time   COLORURINE YELLOW 10/22/2015 1608    APPEARANCEUR CLEAR 10/22/2015 1608   LABSPEC 1.023 10/22/2015 1608   PHURINE 7.5 10/22/2015 1608   GLUCOSEU NEGATIVE 10/22/2015 1608   HGBUR NEGATIVE 10/22/2015 1608   BILIRUBINUR NEGATIVE 10/22/2015 1608   KETONESUR NEGATIVE 10/22/2015 1608   PROTEINUR NEGATIVE 10/22/2015 1608   NITRITE NEGATIVE 10/22/2015 1608   LEUKOCYTESUR NEGATIVE 10/22/2015 1608   Sepsis Labs: @LABRCNTIP (procalcitonin:4,lacticidven:4) )No results found for this or any previous visit (from the past 240 hour(s)).   Radiological Exams on Admission: Dg Chest 2 View  Result Date: 06/08/2016 CLINICAL DATA:  65 y/o  F; shortness of breath. EXAM: CHEST  2 VIEW COMPARISON:  04/19/2016 chest radiograph FINDINGS: Low lung volumes. Stable cardiomegaly given projection and technique. No focal consolidation. No pleural effusion. Multilevel degenerative changes of thoracic spine. IVC filter. Upper abdominal surgical clips, presumably cholecystectomy. IMPRESSION: Stable cardiomegaly. Low lung volumes. No acute pulmonary process identified. Electronically Signed   By: Mitzi HansenLance  Furusawa-Stratton M.D.   On: 06/08/2016 02:46   Ct Angio Chest Pe W And/or Wo Contrast  Result Date: 06/08/2016 CLINICAL DATA:  65 y/o F; dyspnea and hypoxemia with history of pulmonary embolus. EXAM: CT ANGIOGRAPHY CHEST WITH CONTRAST TECHNIQUE: Multidetector CT imaging of the chest was performed using the standard protocol during bolus administration of intravenous contrast. Multiplanar CT image reconstructions and MIPs were obtained to evaluate the vascular anatomy. CONTRAST:  80 cc Isovue 370 COMPARISON:  CT angiogram of the chest dated 11/21/2012. FINDINGS: Cardiovascular: Satisfactory opacification of the pulmonary arteries to the segmental level. Saddle pulmonary embolus extending into multiple segmental pulmonary arteries. RV/LV equals 1.2. Moderate cardiomegaly. No pericardial effusion. Normal caliber thoracic aorta. Mild aortic atherosclerosis with arch  calcifications. Enlarged main pulmonary artery measuring 37 mm indicating pulmonary artery hypertension. Mediastinum/Nodes: No enlarged mediastinal, hilar, or axillary lymph nodes. Thyroid gland, trachea, and esophagus demonstrate no significant findings. Lungs/Pleura: Stable 3 mm right middle lobe nodule (series 7, image 42), no additional recommendations for follow-up. No consolidation of the lungs. No pleural effusion. Upper Abdomen: No acute abnormality. Musculoskeletal: No chest wall abnormality. No acute or significant osseous findings. Review of the MIP images confirms the above findings. IMPRESSION: Positive for acute PE with saddle pulmonary embolus  and CTevidence of right heart strain (RV/LV Ratio = 1.2) consistent with at least submassive (intermediate risk) PE. The presence of right heart strain has been associated with an increased risk of morbidity and mortality. These results were called by telephone at the time of interpretation on 06/08/2016 at 4:19 am to Dr. Alona Bene , who verbally acknowledged these results. Electronically Signed   By: Mitzi Hansen M.D.   On: 06/08/2016 04:21     EKG: Independently reviewed. Sinus rhythm, QTC 489, tachycardia, bifascicular block  Assessment/Plan Principal Problem:   Recurrent pulmonary emboli (HCC) Active Problems:   Essential hypertension   Mixed hyperlipidemia   Morbid obesity (HCC)   OSA on CPAP   AKI (acute kidney injury) (HCC)   Chronic diastolic CHF (congestive heart failure) (HCC)   Elevated troponin   Recurrent pulmonary emboli [facility]): CTA showed submassive PE with right heart straining. Currently patient has tachycardia, but hemodynamically stable. PCCM was consulted by EDP ( EDP dose not Remember doctor's name). PCCM will see pt in AM.  -admit to stepdown for close monitoring overnight -heparin drip -2D echocardiogram ordered -LE dopplers ordered to evaluate for DVT -trop x 3 -f/u PCCM recommendations  Elevated  trop: trop 0.43. No CP. Most likely due to demand ischemia secondary to PE. -will continue ASA, lipitor and metoprolol -Trend trop -f/u 2d echo.  Chronic diastolic CHF (congestive heart failure): pt has 1+ leg edema, but no pulmonary edema on chest x-ray, CHF seems to be compensated. 2-D echo on 11/22/12 showed EF 60% with grade 1 diastolic dysfunction. -Hold Lasix due to worsening renal function -continue aspirin and metoprolol  HTN: -hold lasix and Cozaar due to worsening renal function -Continue metoprolol -IV hydralazine when necessary  HLD: Last LDL was 90 on 03/01/16.  -Continue home medications: lipitor  AKI: cre 1.91. Likely due to prerenal secondary to dehydration and continuation of ARB diruetics - Check FeUrea - Follow up renal function by BMP - Hold Lasix the Cozaar  OSA: -CPAP   DVT ppx: On IV heparin Code Status: Full code Family Communication: None at bed side.   Disposition Plan:  Anticipate discharge back to previous home environment Consults called:   PCCM was consulted by EDP ( EDP dose not Remember doctor's name) Admission status:   SDU/inpation       Date of Service 06/08/2016    Lorretta Harp [facility] Pager [phone]  If 7PM-7AM, please contact night-coverage [email or web address] Password [email or web address] 06/08/2016, 5:25 AM

## 2016-06-08 NOTE — ED Triage Notes (Signed)
Pt reports to ER c/o SOB since yesterday afternoon; hx of sleep apnea and sleeps with CPAP; pt extremely short of breath after standing up from wheelchair and pivoting onto bed; 86% on RA after resting

## 2016-06-09 ENCOUNTER — Inpatient Hospital Stay (HOSPITAL_COMMUNITY): Payer: Medicare Other

## 2016-06-09 DIAGNOSIS — I2692 Saddle embolus of pulmonary artery without acute cor pulmonale: Principal | ICD-10-CM

## 2016-06-09 DIAGNOSIS — Z86718 Personal history of other venous thrombosis and embolism: Secondary | ICD-10-CM

## 2016-06-09 DIAGNOSIS — N179 Acute kidney failure, unspecified: Secondary | ICD-10-CM

## 2016-06-09 DIAGNOSIS — R748 Abnormal levels of other serum enzymes: Secondary | ICD-10-CM

## 2016-06-09 DIAGNOSIS — R609 Edema, unspecified: Secondary | ICD-10-CM

## 2016-06-09 LAB — BASIC METABOLIC PANEL
ANION GAP: 8 (ref 5–15)
BUN: 44 mg/dL — ABNORMAL HIGH (ref 6–20)
CHLORIDE: 106 mmol/L (ref 101–111)
CO2: 25 mmol/L (ref 22–32)
Calcium: 8.7 mg/dL — ABNORMAL LOW (ref 8.9–10.3)
Creatinine, Ser: 1.64 mg/dL — ABNORMAL HIGH (ref 0.44–1.00)
GFR calc non Af Amer: 32 mL/min — ABNORMAL LOW (ref >60)
GFR, EST AFRICAN AMERICAN: 37 mL/min — AB (ref >60)
Glucose, Bld: 155 mg/dL — ABNORMAL HIGH (ref 65–99)
POTASSIUM: 4.1 mmol/L (ref 3.5–5.1)
SODIUM: 139 mmol/L (ref 135–145)

## 2016-06-09 LAB — CBC
HCT: 36.8 % (ref 36.0–46.0)
Hemoglobin: 11.5 g/dL — ABNORMAL LOW (ref 12.0–15.0)
MCH: 28.4 pg (ref 26.0–34.0)
MCHC: 31.3 g/dL (ref 30.0–36.0)
MCV: 90.9 fL (ref 78.0–100.0)
PLATELETS: 218 10*3/uL (ref 150–400)
RBC: 4.05 MIL/uL (ref 3.87–5.11)
RDW: 13.2 % (ref 11.5–15.5)
WBC: 9.3 10*3/uL (ref 4.0–10.5)

## 2016-06-09 LAB — PROTIME-INR
INR: 1.13
PROTHROMBIN TIME: 14.6 s (ref 11.4–15.2)

## 2016-06-09 LAB — LIPID PANEL
CHOL/HDL RATIO: 3.3 ratio
CHOLESTEROL: 137 mg/dL (ref 0–200)
HDL: 41 mg/dL (ref >40)
LDL Cholesterol: 84 mg/dL (ref 0–99)
Triglycerides: 61 mg/dL (ref <150)
VLDL: 12 mg/dL (ref 0–40)

## 2016-06-09 LAB — UREA NITROGEN, URINE: UREA NITROGEN UR: 411 mg/dL

## 2016-06-09 LAB — HEPARIN LEVEL (UNFRACTIONATED)
HEPARIN UNFRACTIONATED: 0.64 [IU]/mL (ref 0.30–0.70)
HEPARIN UNFRACTIONATED: 0.6 [IU]/mL (ref 0.30–0.70)
Heparin Unfractionated: 0.69 IU/mL (ref 0.30–0.70)

## 2016-06-09 LAB — GLUCOSE, CAPILLARY: GLUCOSE-CAPILLARY: 132 mg/dL — AB (ref 65–99)

## 2016-06-09 MED ORDER — DIPHENHYDRAMINE HCL 50 MG/ML IJ SOLN
25.0000 mg | Freq: Once | INTRAMUSCULAR | Status: AC
  Administered 2016-06-09: 25 mg via INTRAVENOUS
  Filled 2016-06-09: qty 1

## 2016-06-09 MED ORDER — METOCLOPRAMIDE HCL 5 MG/ML IJ SOLN
10.0000 mg | Freq: Once | INTRAMUSCULAR | Status: AC
  Administered 2016-06-09: 10 mg via INTRAVENOUS
  Filled 2016-06-09: qty 2

## 2016-06-09 MED ORDER — SODIUM CHLORIDE 0.9 % IV SOLN
INTRAVENOUS | Status: AC
  Administered 2016-06-09 (×2): via INTRAVENOUS

## 2016-06-09 NOTE — Progress Notes (Signed)
[facility]  Brief: 65 y/o female with IVC filter admitted on 12/13 with recurrent 72PE.  S: Feels better, no chest pain, breathing is good  Past Medical History:  Diagnosis Date  . Acute meniscal tear of knee    RIGHT  . Arthritis    "right knee" (11/21/2012)  . Elevated hemoglobin A1c   . Exertional shortness of breath    "this week" (11/21/2012)  . History of DVT (deep vein thrombosis)    IN PREGNANCY  . HTN (hypertension)    CARDIOLOGIST--  DR HOCHREIN-- CLEARANCE NOTE IN EPIC AND CHART FROM 08-21-2012  . Mixed hyperlipidemia   . Obesity   . Pulmonary embolism (HCC)    "3 today" (11/21/2012)  . RBBB (right bundle branch block with left anterior fascicular block)   . Seasonal allergies   . Sinus tachycardia   . Sleep apnea   . Vitamin D deficiency    ROS: no chest pain, no fever, no chills, no nausea, no vomiting, no bleeding  O:  Vitals:   06/09/16 0500 06/09/16 0600 06/09/16 0625 06/09/16 0730  BP: 115/69 (!) 77/40 (!) 107/51   Pulse: 81 79 84   Resp: 16 17 15    Temp:    98.1 F (36.7 C)  TempSrc:    Oral  SpO2: 93% 97% 94%   Weight:      Height:       RA  Gen: morbidly obese, no distress HENT: OP clear, TM's clear, neck supple PULM: CTA B, normal percussion CV: RRR, no mgr, trace edema GI: BS+, soft, nontender Derm: no cyanosis or rash Psyche: normal mood and affect   BMET    Component Value Date/Time   NA 139 06/09/2016 0338   K 4.1 06/09/2016 0338   CL 106 06/09/2016 0338   CO2 25 06/09/2016 0338   GLUCOSE 155 (H) 06/09/2016 0338   BUN 44 (H) 06/09/2016 0338   CREATININE 1.64 (H) 06/09/2016 0338   CREATININE 0.94 04/14/2016 1714   CALCIUM 8.7 (L) 06/09/2016 0338   GFRNONAA 32 (L) 06/09/2016 0338   GFRNONAA 65 04/14/2016 1714   GFRAA 37 (L) 06/09/2016 0338   GFRAA 75 04/14/2016 1714   CBC    Component Value Date/Time   WBC 9.3 06/09/2016 0338   RBC 4.05 06/09/2016 0338   HGB 11.5 (L) 06/09/2016 0338   HCT 36.8 06/09/2016 0338   PLT 218  06/09/2016 0338   MCV 90.9 06/09/2016 0338   MCH 28.4 06/09/2016 0338   MCHC 31.3 06/09/2016 0338   RDW 13.2 06/09/2016 0338   LYMPHSABS 1.1 06/08/2016 0215   MONOABS 0.8 06/08/2016 0215   EOSABS 0.1 06/08/2016 0215   BASOSABS 0.0 06/08/2016 0215   06/08/16 Echo: LVEF 65%, severe LVH, diastolic dysfunction, mild left atrial dilation, estimated PA pressure normal, RV systolic function normal  06/08/16 Lower Ext duplex> negative for DVT bilaterally  Impression/Plan:  Pulmonary embolism: now hemodynamically stable; does not need thrombolysis but the source of this is still uncertain. Discussed with radiology, will plan: - MRV tomorrow (give kidneys a day to improve prior to gad), check upper extremity ultrasound today.   - continue heparin today - consult pharmacy tomorrow for oral anticoagulation options  Move out of ICU  Heber CarolinaBrent McQuaid, MD [facility] Pager: [phone] Cell: [phone] After 3pm or if no response, call [phone]

## 2016-06-09 NOTE — Progress Notes (Signed)
RT NOTE:  Pt stated her grandson was supposed to bring her home machine today, however, he was unable to do so. Pt request O2 via Agency tonight. Her grandson is bringing her machine tomorrow. Pt understands to have RN call RT when machine is brought in.

## 2016-06-09 NOTE — Progress Notes (Signed)
ANTICOAGULATION CONSULT NOTE - follow-up Consult  Pharmacy Consult for Heparin Indication: chest pain/ACS and pulmonary embolus  Allergies  Allergen Reactions  . Codeine Other (See Comments)    Unknown  . Hydrocodone     Itch  . Oxycodone Rash  . Tramadol Rash    Patient Measurements: Height: 5\' 2"  (157.5 cm) Weight: (!) 331 lb 9.2 oz (150.4 kg) IBW/kg (Calculated) : 50.1 Heparin Dosing Weight: 89kg  Vital Signs: Temp: 98.6 F (37 C) (12/13 2316) Temp Source: Oral (12/13 2316) BP: 107/89 (12/13 2200) Pulse Rate: 80 (12/13 2200)  Labs:  Recent Labs  06/08/16 0215 06/08/16 0551 06/08/16 1103 06/08/16 1419 06/08/16 1724 06/09/16 0009  HGB 13.0  --   --   --   --   --   HCT 41.1  --   --   --   --   --   PLT 207  --   --   --   --   --   LABPROT  --   --   --   --   --  14.6  INR  --   --   --   --   --  1.13  HEPARINUNFRC  --   --   --  0.86*  --  0.69  CREATININE 1.91*  --   --   --   --   --   TROPONINI  --  0.62* 0.62*  --  0.57*  --     Estimated Creatinine Clearance: 42.9 mL/min (by C-G formula based on SCr of 1.91 mg/dL (H)).   Medical History: Past Medical History:  Diagnosis Date  . Acute meniscal tear of knee    RIGHT  . Arthritis    "right knee" (11/21/2012)  . Elevated hemoglobin A1c   . Exertional shortness of breath    "this week" (11/21/2012)  . History of DVT (deep vein thrombosis)    IN PREGNANCY  . HTN (hypertension)    CARDIOLOGIST--  DR HOCHREIN-- CLEARANCE NOTE IN EPIC AND CHART FROM 08-21-2012  . Mixed hyperlipidemia   . Obesity   . Pulmonary embolism (HCC)    "3 today" (11/21/2012)  . RBBB (right bundle branch block with left anterior fascicular block)   . Seasonal allergies   . Sinus tachycardia   . Sleep apnea   . Vitamin D deficiency     Medications:  Infusions:  . heparin 1,500 Units/hr (06/08/16 2001)    Assessment: [age] YOF presents with SOB. She has history of PE. Heparin ordered for PE, prior heparin bolus for ACS  ordered due to + Troponin.  CTA confirms saddle PE with evidence of R heart strain per CT but ECHO did not reveal any acute abnormalities.   12/13  First heparin level supratherapeutic (=0.86) on 1700 units/hr  Heparin infusing at expected rate  Baseline CBC WNL  SCr is elevated  No bleeding noted  Today, 12/14  0009 HL=0.69 therapeutic on 1500 units/hr , no infusion or bleeding issues per RN   Goal of Therapy:  Heparin level 0.3-0.7 units/ml Monitor platelets by anticoagulation protocol: Yes   Plan:   Continue heparin drip at 1500 units/hr  Recheck HL with am labs  Daily CBC and heparin level  Await plan for long-term anticoagulation (follow renal function if decision made for DOAC)  Lorenza EvangelistGreen, Pernell Lenoir R 06/09/2016 12:42 AM

## 2016-06-09 NOTE — Progress Notes (Signed)
ANTICOAGULATION CONSULT NOTE - follow-up Consult  Pharmacy Consult for Heparin Indication: chest pain/ACS and pulmonary embolus  Allergies  Allergen Reactions  . Codeine Other (See Comments)    Unknown  . Hydrocodone     Itch  . Oxycodone Rash  . Tramadol Rash    Patient Measurements: Height: 5\' 2"  (157.5 cm) Weight: (!) 331 lb 2.1 oz (150.2 kg) IBW/kg (Calculated) : 50.1 Heparin Dosing Weight: 89kg  Vital Signs: Temp: 98.1 F (36.7 C) (12/14 0730) Temp Source: Oral (12/14 0730) BP: 113/62 (12/14 1000) Pulse Rate: 100 (12/14 1000)  Labs:  Recent Labs  06/08/16 0215 06/08/16 0551 06/08/16 1103  06/08/16 1724 06/09/16 0009 06/09/16 0338 06/09/16 1010  HGB 13.0  --   --   --   --   --  11.5*  --   HCT 41.1  --   --   --   --   --  36.8  --   PLT 207  --   --   --   --   --  218  --   LABPROT  --   --   --   --   --  14.6  --   --   INR  --   --   --   --   --  1.13  --   --   HEPARINUNFRC  --   --   --   < >  --  0.69 0.64 0.60  CREATININE 1.91*  --   --   --   --   --  1.64*  --   TROPONINI  --  0.62* 0.62*  --  0.57*  --   --   --   < > = values in this interval not displayed.  Estimated Creatinine Clearance: 49.9 mL/min (by C-G formula based on SCr of 1.64 mg/dL (H)).   Medical History: Past Medical History:  Diagnosis Date  . Acute meniscal tear of knee    RIGHT  . Arthritis    "right knee" (11/21/2012)  . Elevated hemoglobin A1c   . Exertional shortness of breath    "this week" (11/21/2012)  . History of DVT (deep vein thrombosis)    IN PREGNANCY  . HTN (hypertension)    CARDIOLOGIST--  DR HOCHREIN-- CLEARANCE NOTE IN EPIC AND CHART FROM 08-21-2012  . Mixed hyperlipidemia   . Obesity   . Pulmonary embolism (HCC)    "3 today" (11/21/2012)  . RBBB (right bundle branch block with left anterior fascicular block)   . Seasonal allergies   . Sinus tachycardia   . Sleep apnea   . Vitamin D deficiency     Medications:  Infusions:  . sodium chloride  100 mL/hr at 06/09/16 0925  . heparin 1,500 Units/hr (06/09/16 1000)    Assessment: Kerry Liu presents with SOB. She has history of PE. Heparin ordered for PE, prior heparin bolus for ACS ordered due to + Troponin.  CTA confirms saddle PE with evidence of R heart strain per CT but ECHO did not reveal any acute abnormalities.   12/14: Confirmation HL is therapeutic.    Hgb sl decrease, plts WNL  SCr is elevated but improving  No bleeding noted   Goal of Therapy:  Heparin level 0.3-0.7 units/ml Monitor platelets by anticoagulation protocol: Yes   Plan:   Continue heparin drip at 1500 units/hr  Recheck HL with am labs  Daily CBC and heparin level  Await plan for long-term anticoagulation (follow renal function  if decision made for DOAC)  Haynes Hoehnolleen Makaio Mach, PharmD, BCPS 06/09/2016, 10:55 AM  Pager: [phone]

## 2016-06-09 NOTE — Progress Notes (Signed)
PROGRESS NOTE                                                                                                                                                                                                             Patient Demographics:    Kerry Liu, is a 65 y.o. female, DOB - 08/22/1950, UJW:[id number]  Admit date - 06/08/2016   Admitting Physician Eddie NorthNishant Dacoda Finlay, MD  Outpatient Primary MD for the patient is Nadean CorwinMCKEOWN,WILLIAM DAVID, MD  LOS - 1  Outpatient Specialists: none  Chief Complaint  Patient presents with  . Shortness of Breath       Brief Narrative   65 y.o. morbidly obese female with medical history significant of DVT, PE, ( after hip surgery in 10/2012) s/p of IVC placement in 2014 ( unable to remove it due to body habitus) hypertension, hyperlipidemia, OSA on CPAP, right bundle blockage, morbid obesity, who presents with 1 day history of shortness of breath. Pt tachycardic and hypotensive with elevated tropinin , AKI and  found to have submassive PE on CT angio chest with rt heart strain. Admitted to ICU.    Subjective:   Denies any chest pain or shortness of breath. Blood pressure and heart rate have  been stable   Assessment  & Plan :    Principal Problem:   Recurrent pulmonary emboli (HCC) No clear etiology. Has IVC filter in place. Doppler LE negative for DVT. Plan for MRV tomorrow. Checking upper extremity Doppler. On IV heparin. Could possibly assist to NOAC once renal function improves. Compare needs lifelong anticoagulation) -2D echo echo with normal EF and no RV strain.   Active Problems:  Elevate troponin  suspect demand ischemia secondary to submassive PE. No chest pain symptoms or EKG changes. Echo with normal EF and no wall motion abnormality.  Chronic diastolic CHF (congestive heart failure):  pt has 1+ leg edema, but no pulmonary edema on chest x-ray, CHF seems to be  compensated. 2-D echo on 11/22/12 showed EF 60% with grade 1 diastolic dysfunction. -Lasix held due to acute kidney injury -continue aspirin and metoprolol  Hypotension Possibly due to inappropriate size scuff. Stable on manual read.    AKI:   Likely due to prerenal secondary to dehydration and continuation of ARB and diruetics - Start IV hydration. - Hold Lasix and Cozaar  OSA: -CPAP  Code Status : full code  Family Communication  : None at bedside  Disposition Plan  : home once improved  Barriers For Discharge : active symptoms  Consults  :  PCCM  Procedures  :  CT angio chest  echo  DVT Prophylaxis  :  IV heparin  Lab Results  Component Value Date   PLT 218 06/09/2016    Antibiotics  :    Anti-infectives    None        Objective:   Vitals:   06/09/16 0730 06/09/16 0800 06/09/16 0900 06/09/16 1000  BP:  (!) 113/54 (!) 95/58 113/62  Pulse:  77 100 100  Resp:  14 (!) 21 18  Temp: 98.1 F (36.7 C)     TempSrc: Oral     SpO2:  92% 96% 95%  Weight:      Height:        Wt Readings from Last 3 Encounters:  06/09/16 (!) 150.2 kg (331 lb 2.1 oz)  05/05/16 (!) 147 kg (324 lb)  04/14/16 (!) 152.4 kg (336 lb)     Intake/Output Summary (Last 24 hours) at 06/09/16 1105 Last data filed at 06/09/16 1000  Gross per 24 hour  Intake              542 ml  Output              301 ml  Net              241 ml     Physical Exam  Gen: Redo obese female, not in distress HEENT:  moist mucosa, supple neck Chest: Diminished breath sounds due to body habitus CVS: S1&S2 normal,no murmurs, rubs or gallop GI: soft, NT, ND,  Musculoskeletal: warm, no edema     Data Review:    CBC  Recent Labs Lab 06/08/16 0215 06/09/16 0338  WBC 10.4 9.3  HGB 13.0 11.5*  HCT 41.1 36.8  PLT 207 218  MCV 91.1 90.9  MCH 28.8 28.4  MCHC 31.6 31.3  RDW 13.1 13.2  LYMPHSABS 1.1  --   MONOABS 0.8  --   EOSABS 0.1  --   BASOSABS 0.0  --     Chemistries    Recent Labs Lab 06/08/16 0215 06/09/16 0338  NA 138 139  K 4.3 4.1  CL 105 106  CO2 25 25  GLUCOSE 242* 155*  BUN 37* 44*  CREATININE 1.91* 1.64*  CALCIUM 9.1 8.7*  AST 22  --   ALT 25  --   ALKPHOS 113  --   BILITOT 0.9  --    ------------------------------------------------------------------------------------------------------------------  Recent Labs  06/09/16 0338  CHOL 137  HDL 41  LDLCALC 84  TRIG 61  CHOLHDL 3.3    Lab Results  Component Value Date   HGBA1C 5.8 (H) 03/01/2016   ------------------------------------------------------------------------------------------------------------------ No results for input(s): TSH, T4TOTAL, T3FREE, THYROIDAB in the last 72 hours.  Invalid input(s): FREET3 ------------------------------------------------------------------------------------------------------------------ No results for input(s): VITAMINB12, FOLATE, FERRITIN, TIBC, IRON, RETICCTPCT in the last 72 hours.  Coagulation profile  Recent Labs Lab 06/09/16 0009  INR 1.13    No results for input(s): DDIMER in the last 72 hours.  Cardiac Enzymes  Recent Labs Lab 06/08/16 0551 06/08/16 1103 06/08/16 1724  TROPONINI 0.62* 0.62* 0.57*   ------------------------------------------------------------------------------------------------------------------    Component Value Date/Time   BNP 35.9 06/08/2016 0551   BNP 184.7 (H) 04/14/2016 1714    Inpatient Medications  Scheduled Meds: . aspirin EC  81  mg Oral Daily  . atorvastatin  40 mg Oral q1800  . cholecalciferol  5,000 Units Oral Daily  . fluticasone  1 spray Each Nare Daily  . magnesium oxide  400 mg Oral Daily  . metoprolol  50 mg Oral BID  . minoxidil  2.5 mg Oral Daily  . sodium chloride flush  3 mL Intravenous Q12H   Continuous Infusions: . sodium chloride 100 mL/hr at 06/09/16 2956  . heparin 1,500 Units/hr (06/09/16 1000)   PRN Meds:.acetaminophen, albuterol, hydrALAZINE,  ondansetron **OR** ondansetron (ZOFRAN) IV, zolpidem  Micro Results Recent Results (from the past 240 hour(s))  MRSA PCR Screening     Status: None   Collection Time: 06/08/16  3:50 PM  Result Value Ref Range Status   MRSA by PCR NEGATIVE NEGATIVE Final    Comment:        The GeneXpert MRSA Assay (FDA approved for NASAL specimens only), is one component of a comprehensive MRSA colonization surveillance program. It is not intended to diagnose MRSA infection nor to guide or monitor treatment for MRSA infections.     Radiology Reports Dg Chest 2 View  Result Date: 06/08/2016 CLINICAL DATA:  65 y/o  F; shortness of breath. EXAM: CHEST  2 VIEW COMPARISON:  04/19/2016 chest radiograph FINDINGS: Low lung volumes. Stable cardiomegaly given projection and technique. No focal consolidation. No pleural effusion. Multilevel degenerative changes of thoracic spine. IVC filter. Upper abdominal surgical clips, presumably cholecystectomy. IMPRESSION: Stable cardiomegaly. Low lung volumes. No acute pulmonary process identified. Electronically Signed   By: Mitzi Hansen M.D.   On: 06/08/2016 02:46   Ct Angio Chest Pe W And/or Wo Contrast  Result Date: 06/08/2016 CLINICAL DATA:  65 y/o F; dyspnea and hypoxemia with history of pulmonary embolus. EXAM: CT ANGIOGRAPHY CHEST WITH CONTRAST TECHNIQUE: Multidetector CT imaging of the chest was performed using the standard protocol during bolus administration of intravenous contrast. Multiplanar CT image reconstructions and MIPs were obtained to evaluate the vascular anatomy. CONTRAST:  80 cc Isovue 370 COMPARISON:  CT angiogram of the chest dated 11/21/2012. FINDINGS: Cardiovascular: Satisfactory opacification of the pulmonary arteries to the segmental level. Saddle pulmonary embolus extending into multiple segmental pulmonary arteries. RV/LV equals 1.2. Moderate cardiomegaly. No pericardial effusion. Normal caliber thoracic aorta. Mild aortic  atherosclerosis with arch calcifications. Enlarged main pulmonary artery measuring 37 mm indicating pulmonary artery hypertension. Mediastinum/Nodes: No enlarged mediastinal, hilar, or axillary lymph nodes. Thyroid gland, trachea, and esophagus demonstrate no significant findings. Lungs/Pleura: Stable 3 mm right middle lobe nodule (series 7, image 42), no additional recommendations for follow-up. No consolidation of the lungs. No pleural effusion. Upper Abdomen: No acute abnormality. Musculoskeletal: No chest wall abnormality. No acute or significant osseous findings. Review of the MIP images confirms the above findings. IMPRESSION: Positive for acute PE with saddle pulmonary embolus and CTevidence of right heart strain (RV/LV Ratio = 1.2) consistent with at least submassive (intermediate risk) PE. The presence of right heart strain has been associated with an increased risk of morbidity and mortality. These results were called by telephone at the time of interpretation on 06/08/2016 at 4:19 am to Dr. Alona Bene , who verbally acknowledged these results. Electronically Signed   By: Mitzi Hansen M.D.   On: 06/08/2016 04:21    Time Spent in minutes  35   Eddie North M.D on 06/09/2016 at 11:05 AM  Between 7am to 7pm - Pager - [phone]  After 7pm go to [email or web address] - password [email or web address]  [facility] -  Office  [phone]

## 2016-06-09 NOTE — Progress Notes (Signed)
VASCULAR LAB PRELIMINARY  PRELIMINARY  PRELIMINARY  PRELIMINARY  Bilateral upper extremity venous duplex completed.    Preliminary report:  Bilateral:  No evidence of DVT or superficial thrombosis.  Liset Mcmonigle, RVS 06/09/2016, 4:03 PM

## 2016-06-09 NOTE — Care Management Note (Signed)
Case Management Note  Patient Details  Name: Kerry Liu MRN: [id number] Date of Birth: 09/23/1950  Subjective/Objective:     P.e. Via ct chest               Action/Plan:Date:  June 09, 2016 Chart reviewed for concurrent status and case management needs. Will continue to follow patient progress. Discharge Planning: following for needs Expected discharge date: [id number] Marcelle SmilingRhonda Syona Wroblewski, BSN, [place]3, [place]   [phone]   Expected Discharge Date:   (unknown)               Expected Discharge Plan:  Home/Self Care  In-House Referral:     Discharge planning Services     Post Acute Care Choice:    Choice offered to:     DME Arranged:    DME Agency:     HH Arranged:    HH Agency:     Status of Service:  In process, will continue to follow  If discussed at Long Length of Stay Meetings, dates discussed:    Additional Comments:  [name], [name], RN 06/09/2016, 11:56 AM

## 2016-06-10 ENCOUNTER — Inpatient Hospital Stay (HOSPITAL_COMMUNITY): Payer: Medicare Other

## 2016-06-10 LAB — BASIC METABOLIC PANEL
ANION GAP: 5 (ref 5–15)
BUN: 32 mg/dL — ABNORMAL HIGH (ref 6–20)
CO2: 25 mmol/L (ref 22–32)
CREATININE: 1.09 mg/dL — AB (ref 0.44–1.00)
Calcium: 8.5 mg/dL — ABNORMAL LOW (ref 8.9–10.3)
Chloride: 111 mmol/L (ref 101–111)
GFR calc Af Amer: >60 mL/min (ref >60)
GFR, EST NON AFRICAN AMERICAN: 53 mL/min — AB (ref >60)
GLUCOSE: 134 mg/dL — AB (ref 65–99)
Potassium: 4.6 mmol/L (ref 3.5–5.1)
Sodium: 141 mmol/L (ref 135–145)

## 2016-06-10 LAB — CBC
HEMATOCRIT: 35 % — AB (ref 36.0–46.0)
HEMOGLOBIN: 11 g/dL — AB (ref 12.0–15.0)
MCH: 28.4 pg (ref 26.0–34.0)
MCHC: 31.4 g/dL (ref 30.0–36.0)
MCV: 90.2 fL (ref 78.0–100.0)
PLATELETS: 213 10*3/uL (ref 150–400)
RBC: 3.88 MIL/uL (ref 3.87–5.11)
RDW: 12.9 % (ref 11.5–15.5)
WBC: 7.8 10*3/uL (ref 4.0–10.5)

## 2016-06-10 LAB — HEMOGLOBIN A1C
Hgb A1c MFr Bld: 6.3 % — ABNORMAL HIGH (ref 4.8–5.6)
MEAN PLASMA GLUCOSE: 134 mg/dL

## 2016-06-10 LAB — HEPARIN LEVEL (UNFRACTIONATED): HEPARIN UNFRACTIONATED: 0.56 [IU]/mL (ref 0.30–0.70)

## 2016-06-10 LAB — GLUCOSE, CAPILLARY: Glucose-Capillary: 203 mg/dL — ABNORMAL HIGH (ref 65–99)

## 2016-06-10 MED ORDER — IOPAMIDOL (ISOVUE-300) INJECTION 61%
150.0000 mL | Freq: Once | INTRAVENOUS | Status: AC | PRN
  Administered 2016-06-10: 150 mL via INTRAVENOUS

## 2016-06-10 NOTE — Progress Notes (Signed)
ANTICOAGULATION CONSULT NOTE - Follow-Up Consult  Pharmacy Consult for Heparin Indication: chest pain/ACS and pulmonary embolus  Allergies  Allergen Reactions  . Codeine Other (See Comments)    Unknown  . Hydrocodone     Itch  . Oxycodone Rash  . Tramadol Rash    Patient Measurements: Height: 5\' 2"  (157.5 cm) Weight: (!) 343 lb 7.6 oz (155.8 kg) IBW/kg (Calculated) : 50.1 Heparin Dosing Weight: 89kg  Vital Signs: Temp: 98.2 F (36.8 C) (12/15 0648) Temp Source: Oral (12/15 0648) BP: 124/68 (12/15 0648) Pulse Rate: 66 (12/15 0648)  Labs:  Recent Labs  06/08/16 0215 06/08/16 0551 06/08/16 1103  06/08/16 1724 06/09/16 0009 06/09/16 0338 06/09/16 1010 06/10/16 0536  HGB 13.0  --   --   --   --   --  11.5*  --  11.0*  HCT 41.1  --   --   --   --   --  36.8  --  35.0*  PLT 207  --   --   --   --   --  218  --  213  LABPROT  --   --   --   --   --  14.6  --   --   --   INR  --   --   --   --   --  1.13  --   --   --   HEPARINUNFRC  --   --   --   < >  --  0.69 0.64 0.60 0.56  CREATININE 1.91*  --   --   --   --   --  1.64*  --  1.09*  TROPONINI  --  0.62* 0.62*  --  0.57*  --   --   --   --   < > = values in this interval not displayed.  Estimated Creatinine Clearance: 77.1 mL/min (by C-G formula based on SCr of 1.09 mg/dL (H)).   Medical History: Past Medical History:  Diagnosis Date  . Acute meniscal tear of knee    RIGHT  . Arthritis    "right knee" (11/21/2012)  . Elevated hemoglobin A1c   . Exertional shortness of breath    "this week" (11/21/2012)  . History of DVT (deep vein thrombosis)    IN PREGNANCY  . HTN (hypertension)    CARDIOLOGIST--  DR HOCHREIN-- CLEARANCE NOTE IN EPIC AND CHART FROM 08-21-2012  . Mixed hyperlipidemia   . Obesity   . Pulmonary embolism (HCC)    "3 today" (11/21/2012)  . RBBB (right bundle branch block with left anterior fascicular block)   . Seasonal allergies   . Sinus tachycardia   . Sleep apnea   . Vitamin D  deficiency     Medications:  Infusions:  . heparin 1,500 Units/hr (06/09/16 1511)    Assessment: [age] yoF presents with SOB. She has history of PE. Heparin ordered for PE, prior heparin bolus for ACS ordered due to + Troponin.  CTA confirms saddle PE with evidence of R heart strain per CT but ECHO did not reveal any acute abnormalities.   Today, 06/10/16:   Hgb sl decrease at 11.0, plts WNL  HL is 0.56, therapeutic  SCr is elevated but improving, down to 1.09 today   No bleeding noted and no line issues per RN  Goal of Therapy:  Heparin level 0.3-0.7 units/ml Monitor platelets by anticoagulation protocol: Yes   Plan:   Continue heparin drip at 1500 units/hr  Recheck HL with am labs  Daily CBC and heparin level  Await plan for long-term anticoagulation (follow renal function if decision made for DOAC)   Adalberto ColeNikola Rielly Corlett, PharmD, BCPS Pager [phone] 06/10/2016 9:31 AM

## 2016-06-10 NOTE — Progress Notes (Signed)
[facility]  Brief: 65 y/o female with IVC filter admitted on 12/13 with recurrent PE.  S: Feels better, no chest pain, breathing is good  Past Medical History:  Diagnosis Date  . Acute meniscal tear of knee    RIGHT  . Arthritis    "right knee" (11/21/2012)  . Elevated hemoglobin A1c   . Exertional shortness of breath    "this week" (11/21/2012)  . History of DVT (deep vein thrombosis)    IN PREGNANCY  . HTN (hypertension)    CARDIOLOGIST--  DR HOCHREIN-- CLEARANCE NOTE IN EPIC AND CHART FROM 08-21-2012  . Mixed hyperlipidemia   . Obesity   . Pulmonary embolism (HCC)    "3 today" (11/21/2012)  . RBBB (right bundle branch block with left anterior fascicular block)   . Seasonal allergies   . Sinus tachycardia   . Sleep apnea   . Vitamin D deficiency    ROS: no chest pain, no fever, no chills, no nausea, no vomiting, no bleeding  O:  Vitals:   06/09/16 1200 06/09/16 1314 06/09/16 2121 06/10/16 0648  BP: 113/71 (!) 105/58 135/60 124/68  Pulse: 94 95 (!) 113 66  Resp: 19 20 20 19   Temp: 97.8 F (36.6 C) 97.5 F (36.4 C) 98.5 F (36.9 C) 98.2 F (36.8 C)  TempSrc: Oral Oral Oral Oral  SpO2: 95% 98% 97% 99%  Weight:    (!) 343 lb 7.6 oz (155.8 kg)  Height:       RA  Gen: morbidly obese, no distress HENT: OP clear, TM's clear, neck supple PULM: CTA B, normal percussion CV: RRR, no mgr, trace edema GI: BS+, soft, nontender Derm: no cyanosis or rash Psyche: normal mood and affect   BMET    Component Value Date/Time   NA 141 06/10/2016 0536   K 4.6 06/10/2016 0536   CL 111 06/10/2016 0536   CO2 25 06/10/2016 0536   GLUCOSE 134 (H) 06/10/2016 0536   BUN 32 (H) 06/10/2016 0536   CREATININE 1.09 (H) 06/10/2016 0536   CREATININE 0.94 04/14/2016 1714   CALCIUM 8.5 (L) 06/10/2016 0536   GFRNONAA 53 (L) 06/10/2016 0536   GFRNONAA 65 04/14/2016 1714   GFRAA >60 06/10/2016 0536   GFRAA 75 04/14/2016 1714   CBC    Component Value Date/Time   WBC 7.8 06/10/2016 0536    RBC 3.88 06/10/2016 0536   HGB 11.0 (L) 06/10/2016 0536   HCT 35.0 (L) 06/10/2016 0536   PLT 213 06/10/2016 0536   MCV 90.2 06/10/2016 0536   MCH 28.4 06/10/2016 0536   MCHC 31.4 06/10/2016 0536   RDW 12.9 06/10/2016 0536   LYMPHSABS 1.1 06/08/2016 0215   MONOABS 0.8 06/08/2016 0215   EOSABS 0.1 06/08/2016 0215   BASOSABS 0.0 06/08/2016 0215   06/08/16 Echo: LVEF 65%, severe LVH, diastolic dysfunction, mild left atrial dilation, estimated PA pressure normal, RV systolic function normal  06/08/16 Lower Ext duplex> negative for DVT bilaterally 06/09/16 UEDS neg for DVT  Impression/Plan:  Pulmonary embolism: now hemodynamically stable; does not need thrombolysis but the source of this is still uncertain. Discussed with radiology, will plan: - MRV 12/15 at [facility](give kidneys a day to improve prior to gad), - continue heparin today - consult pharmacy  for oral anticoagulation options    [facility] Lashundra Shiveley ACNP [facility] Pager [phone] till 3 pm If no answer page [phone] 06/10/2016, 11:02 AM

## 2016-06-10 NOTE — Progress Notes (Signed)
D/w IR  Dr Marthenia RollingWoganer. Suggested CT venogram may be a better test to detect clot above IVC and would not cause any artifact that may be seen with an MRV.  Since her renal function has normalized, she should be able to handle contrast. Will order CT venogram instead.

## 2016-06-10 NOTE — Progress Notes (Signed)
RT talked with patient about wearing a cpap tonight. Patients son was suppose to bring machine up to her today but was unable to. Patient wishes to wear oxygen tonight instead of one of the hospitals machine. RT encouraged patient to call if she changes her mind. RT will continue to monitor.

## 2016-06-10 NOTE — Progress Notes (Addendum)
PROGRESS NOTE                                                                                                                                                                                                             Patient Demographics:    Kerry Liu, is a 65 y.o. female, DOB - 01/22/1951, WUJ:[id number]  Admit date - 06/08/2016   Admitting Physician Eddie NorthNishant Ryosuke Ericksen, MD  Outpatient Primary MD for the patient is Nadean CorwinMCKEOWN,WILLIAM DAVID, MD  LOS - 2  Outpatient Specialists: none  Chief Complaint  Patient presents with  . Shortness of Breath       Brief Narrative   65 y.o. morbidly obese female with medical history significant of DVT, PE, ( after hip surgery in 10/2012) s/p of IVC placement in 2014 ( unable to remove it due to body habitus) hypertension, hyperlipidemia, OSA on CPAP, right bundle blockage, morbid obesity, who presents with 1 day history of shortness of breath. Pt tachycardic and hypotensive with elevated tropinin , AKI and  found to have submassive PE on CT angio chest with rt heart strain. Admitted to ICU.    Subjective:   No further symptoms.    Assessment  & Plan :    Principal Problem:   Recurrent pulmonary emboli (HCC) No clear etiology. Has IVC filter in place. Doppler LE negative for DVT.   upper extremity Doppler negative for DVT.Marland Kitchen. CT venogram shows entrapped thrombus in infrarenal IVC filter without caval occlusion. Will d/w IR.  -needs lifelong anticoagulation. Transition to NOAC if no intervention. -2D echo echo with normal EF and no RV strain.   Active Problems:  Elevate troponin -demand ischemia secondary to submassive PE. No chest pain symptoms or EKG changes. Echo with normal EF and no wall motion abnormality.  Chronic diastolic CHF (congestive heart failure): -compensated. Repeat echo with normal EF. -Lasix held due to acute kidney injury -continue aspirin and  metoprolol  Hypotension Possibly due to inappropriate size cuff. Stable on manual read after BP cuff adjusted..    AKI:  - prerenal secondary to dehydration and continuation of ARB and diruetics -Improved with IV fluids - Hold Lasix and Cozaar  OSA: -CPAP   Code Status : full code  Family Communication  : None at bedside  Disposition Plan  : home pending MRV result , if negative discharge in  a.m. on oral anticoagulation.  Barriers For Discharge : Pending MRV.  Consults  :  PCCM  Procedures  :  CT angio chest  echo MRV  DVT Prophylaxis  :  IV heparin  Lab Results  Component Value Date   PLT 213 06/10/2016    Antibiotics  :    Anti-infectives    None        Objective:   Vitals:   06/09/16 1200 06/09/16 1314 06/09/16 2121 06/10/16 0648  BP: 113/71 (!) 105/58 135/60 124/68  Pulse: 94 95 (!) 113 66  Resp: 19 20 20 19   Temp: 97.8 F (36.6 C) 97.5 F (36.4 C) 98.5 F (36.9 C) 98.2 F (36.8 C)  TempSrc: Oral Oral Oral Oral  SpO2: 95% 98% 97% 99%  Weight:    (!) 155.8 kg (343 lb 7.6 oz)  Height:        Wt Readings from Last 3 Encounters:  06/10/16 (!) 155.8 kg (343 lb 7.6 oz)  05/05/16 (!) 147 kg (324 lb)  04/14/16 (!) 152.4 kg (336 lb)     Intake/Output Summary (Last 24 hours) at 06/10/16 1406 Last data filed at 06/10/16 0800  Gross per 24 hour  Intake          2603.34 ml  Output                0 ml  Net          2603.34 ml     Physical Exam  Gen: not in distress HEENT:  moist mucosa, supple neck Chest: Diminished breath sounds  CVS: S1&S2 normal,no murmurs,  GI: soft, NT, ND,  Musculoskeletal: warm, no edema     Data Review:    CBC  Recent Labs Lab 06/08/16 0215 06/09/16 0338 06/10/16 0536  WBC 10.4 9.3 7.8  HGB 13.0 11.5* 11.0*  HCT 41.1 36.8 35.0*  PLT 207 218 213  MCV 91.1 90.9 90.2  MCH 28.8 28.4 28.4  MCHC 31.6 31.3 31.4  RDW 13.1 13.2 12.9  LYMPHSABS 1.1  --   --   MONOABS 0.8  --   --   EOSABS 0.1  --   --    BASOSABS 0.0  --   --     Chemistries   Recent Labs Lab 06/08/16 0215 06/09/16 0338 06/10/16 0536  NA 138 139 141  K 4.3 4.1 4.6  CL 105 106 111  CO2 25 25 25   GLUCOSE 242* 155* 134*  BUN 37* 44* 32*  CREATININE 1.91* 1.64* 1.09*  CALCIUM 9.1 8.7* 8.5*  AST 22  --   --   ALT 25  --   --   ALKPHOS 113  --   --   BILITOT 0.9  --   --    ------------------------------------------------------------------------------------------------------------------  Recent Labs  06/09/16 0338  CHOL 137  HDL 41  LDLCALC 84  TRIG 61  CHOLHDL 3.3    Lab Results  Component Value Date   HGBA1C 6.3 (H) 06/09/2016   ------------------------------------------------------------------------------------------------------------------ No results for input(s): TSH, T4TOTAL, T3FREE, THYROIDAB in the last 72 hours.  Invalid input(s): FREET3 ------------------------------------------------------------------------------------------------------------------ No results for input(s): VITAMINB12, FOLATE, FERRITIN, TIBC, IRON, RETICCTPCT in the last 72 hours.  Coagulation profile  Recent Labs Lab 06/09/16 0009  INR 1.13    No results for input(s): DDIMER in the last 72 hours.  Cardiac Enzymes  Recent Labs Lab 06/08/16 0551 06/08/16 1103 06/08/16 1724  TROPONINI 0.62* 0.62* 0.57*   ------------------------------------------------------------------------------------------------------------------    Component  Value Date/Time   BNP 35.9 06/08/2016 0551   BNP 184.7 (H) 04/14/2016 1714    Inpatient Medications  Scheduled Meds: . aspirin EC  81 mg Oral Daily  . atorvastatin  40 mg Oral q1800  . cholecalciferol  5,000 Units Oral Daily  . fluticasone  1 spray Each Nare Daily  . magnesium oxide  400 mg Oral Daily  . metoprolol  50 mg Oral BID  . minoxidil  2.5 mg Oral Daily  . sodium chloride flush  3 mL Intravenous Q12H   Continuous Infusions: . heparin 1,500 Units/hr (06/10/16  1127)   PRN Meds:.acetaminophen, albuterol, hydrALAZINE, ondansetron **OR** ondansetron (ZOFRAN) IV, zolpidem  Micro Results Recent Results (from the past 240 hour(s))  MRSA PCR Screening     Status: None   Collection Time: 06/08/16  3:50 PM  Result Value Ref Range Status   MRSA by PCR NEGATIVE NEGATIVE Final    Comment:        The GeneXpert MRSA Assay (FDA approved for NASAL specimens only), is one component of a comprehensive MRSA colonization surveillance program. It is not intended to diagnose MRSA infection nor to guide or monitor treatment for MRSA infections.     Radiology Reports Dg Chest 2 View  Result Date: 06/08/2016 CLINICAL DATA:  65 y/o  F; shortness of breath. EXAM: CHEST  2 VIEW COMPARISON:  04/19/2016 chest radiograph FINDINGS: Low lung volumes. Stable cardiomegaly given projection and technique. No focal consolidation. No pleural effusion. Multilevel degenerative changes of thoracic spine. IVC filter. Upper abdominal surgical clips, presumably cholecystectomy. IMPRESSION: Stable cardiomegaly. Low lung volumes. No acute pulmonary process identified. Electronically Signed   By: Mitzi Hansen M.D.   On: 06/08/2016 02:46   Ct Angio Chest Pe W And/or Wo Contrast  Result Date: 06/08/2016 CLINICAL DATA:  65 y/o F; dyspnea and hypoxemia with history of pulmonary embolus. EXAM: CT ANGIOGRAPHY CHEST WITH CONTRAST TECHNIQUE: Multidetector CT imaging of the chest was performed using the standard protocol during bolus administration of intravenous contrast. Multiplanar CT image reconstructions and MIPs were obtained to evaluate the vascular anatomy. CONTRAST:  80 cc Isovue 370 COMPARISON:  CT angiogram of the chest dated 11/21/2012. FINDINGS: Cardiovascular: Satisfactory opacification of the pulmonary arteries to the segmental level. Saddle pulmonary embolus extending into multiple segmental pulmonary arteries. RV/LV equals 1.2. Moderate cardiomegaly. No pericardial  effusion. Normal caliber thoracic aorta. Mild aortic atherosclerosis with arch calcifications. Enlarged main pulmonary artery measuring 37 mm indicating pulmonary artery hypertension. Mediastinum/Nodes: No enlarged mediastinal, hilar, or axillary lymph nodes. Thyroid gland, trachea, and esophagus demonstrate no significant findings. Lungs/Pleura: Stable 3 mm right middle lobe nodule (series 7, image 42), no additional recommendations for follow-up. No consolidation of the lungs. No pleural effusion. Upper Abdomen: No acute abnormality. Musculoskeletal: No chest wall abnormality. No acute or significant osseous findings. Review of the MIP images confirms the above findings. IMPRESSION: Positive for acute PE with saddle pulmonary embolus and CTevidence of right heart strain (RV/LV Ratio = 1.2) consistent with at least submassive (intermediate risk) PE. The presence of right heart strain has been associated with an increased risk of morbidity and mortality. These results were called by telephone at the time of interpretation on 06/08/2016 at 4:19 am to Dr. Alona Bene , who verbally acknowledged these results. Electronically Signed   By: Mitzi Hansen M.D.   On: 06/08/2016 04:21    Time Spent in minutes  25   Eddie North M.D on 06/10/2016 at 2:06 PM  Between 7am to  7pm - Pager - [phone]  After 7pm go to [email or web address] - password [email or web address]  [facility] -  Office  [phone]

## 2016-06-11 LAB — GLUCOSE, CAPILLARY: GLUCOSE-CAPILLARY: 186 mg/dL — AB (ref 65–99)

## 2016-06-11 LAB — CBC
HEMATOCRIT: 34 % — AB (ref 36.0–46.0)
HEMOGLOBIN: 10.8 g/dL — AB (ref 12.0–15.0)
MCH: 27.8 pg (ref 26.0–34.0)
MCHC: 31.8 g/dL (ref 30.0–36.0)
MCV: 87.4 fL (ref 78.0–100.0)
Platelets: 217 10*3/uL (ref 150–400)
RBC: 3.89 MIL/uL (ref 3.87–5.11)
RDW: 12.5 % (ref 11.5–15.5)
WBC: 7.6 10*3/uL (ref 4.0–10.5)

## 2016-06-11 LAB — HEPARIN LEVEL (UNFRACTIONATED): HEPARIN UNFRACTIONATED: 0.44 [IU]/mL (ref 0.30–0.70)

## 2016-06-11 MED ORDER — ONDANSETRON 4 MG PO TBDP: 8.0000 mg | ORAL_TABLET | Freq: Once | ORAL | Status: DC

## 2016-06-11 NOTE — Progress Notes (Signed)
Notified physician that [facility] report patient has an entrapped thrombus in IVC filter without occlusion.  No orders received.

## 2016-06-11 NOTE — Progress Notes (Signed)
[facility] P[facility]  Brief: 65 y/o female with IVC filter admitted on 12/13 with recurrent PE.  S: Feels better, no chest pain, breathing is good, but she asks when she will breathe "normally" again.  Past Medical History:  Diagnosis Date  . Acute meniscal tear of knee    RIGHT  . Arthritis    "right knee" (11/21/2012)  . Elevated hemoglobin A1c   . Exertional shortness of breath    "this week" (11/21/2012)  . History of DVT (deep vein thrombosis)    IN PREGNANCY  . HTN (hypertension)    CARDIOLOGIST--  DR HOCHREIN-- CLEARANCE NOTE IN EPIC AND CHART FROM 08-21-2012  . Mixed hyperlipidemia   . Obesity   . Pulmonary embolism (HCC)    "3 today" (11/21/2012)  . RBBB (right bundle branch block with left anterior fascicular block)   . Seasonal allergies   . Sinus tachycardia   . Sleep apnea   . Vitamin D deficiency    ROS: no chest pain, no fever, no chills, no nausea, no vomiting, no bleeding  O:  Vitals:   06/09/16 2121 06/10/16 0648 06/10/16 2018 06/11/16 0554  BP: 135/60 124/68 (!) 149/85 131/72  Pulse: (!) 113 66 87 68  Resp: 20 19 20 19   Temp: 98.5 F (36.9 C) 98.2 F (36.8 C) 98.8 F (37.1 C) 97.9 F (36.6 C)  TempSrc: Oral Oral Oral Oral  SpO2: 97% 99% 98% 100%  Weight:  (!) 155.8 kg (343 lb 7.6 oz)    Height:       RA  Gen: morbidly obese, no distress, easily conversational on room air HENT: OP clear, TM's clear, neck supple PULM: CTA B, normal percussion, shallow c/w habitus. No audible rales or rub CV: RRR, no mgr, trace edema GI: BS+, soft, nontender Derm: no cyanosis or rash Psyche: normal mood and affect   BMET    Component Value Date/Time   NA 141 06/10/2016 0536   K 4.6 06/10/2016 0536   CL 111 06/10/2016 0536   CO2 25 06/10/2016 0536   GLUCOSE 134 (H) 06/10/2016 0536   BUN 32 (H) 06/10/2016 0536   CREATININE 1.09 (H) 06/10/2016 0536   CREATININE 0.94 04/14/2016 1714   CALCIUM 8.5 (L) 06/10/2016 0536   GFRNONAA 53 (L) 06/10/2016 0536   GFRNONAA 65  04/14/2016 1714   GFRAA >60 06/10/2016 0536   GFRAA 75 04/14/2016 1714   CBC    Component Value Date/Time   WBC 7.6 06/11/2016 0520   RBC 3.89 06/11/2016 0520   HGB 10.8 (L) 06/11/2016 0520   HCT 34.0 (L) 06/11/2016 0520   PLT 217 06/11/2016 0520   MCV 87.4 06/11/2016 0520   MCH 27.8 06/11/2016 0520   MCHC 31.8 06/11/2016 0520   RDW 12.5 06/11/2016 0520   LYMPHSABS 1.1 06/08/2016 0215   MONOABS 0.8 06/08/2016 0215   EOSABS 0.1 06/08/2016 0215   BASOSABS 0.0 06/08/2016 0215   06/08/16 Echo: LVEF 65%, severe LVH, diastolic dysfunction, mild left atrial dilation, estimated PA pressure normal, RV systolic function normal  06/08/16 Lower Ext duplex> negative for DVT bilaterally 12/ 15/17- CT venogram done, interp pending  Impression/Plan:  Pulmonary embolism: now hemodynamically stable; does not need thrombolysis but the source of this is still uncertain. Decision was mad to go with CT venogram instead of MRV. Pending Radiology interp. Large, lurking clot burden would be reason to consider thrombolysis, depending on review of CTV.   - continue heparin today - consult pharmacy tomorrow for oral anticoagulation  options    CD Dawit Tankard, MD [place] P[facility]M Pager: [phone] After 3pm or if no response, call [phone]

## 2016-06-11 NOTE — Progress Notes (Addendum)
PROGRESS NOTE                                                                                                                                                                                                             Patient Demographics:    Kerry Liu, is a 65 y.o. female, DOB - 12/16/1950, ZOX:[id number]  Admit date - 06/08/2016   Admitting Physician Eddie NorthNishant Suzannah Bettes, MD  Outpatient Primary MD for the patient is Nadean CorwinMCKEOWN,WILLIAM DAVID, MD  LOS - 3  Outpatient Specialists: none  Chief Complaint  Patient presents with  . Shortness of Breath       Brief Narrative   65 y.o. morbidly obese female with medical history significant of DVT, PE, ( after hip surgery in 10/2012) s/p of IVC placement in 2014 ( unable to remove it due to body habitus) hypertension, hyperlipidemia, OSA on CPAP, right bundle blockage, morbid obesity, who presents with 1 day history of shortness of breath. Pt tachycardic and hypotensive with elevated tropinin , AKI and  found to have submassive PE on CT angio chest with rt heart strain. Admitted to ICU.    Subjective:   No further symptoms.    Assessment  & Plan :    Principal Problem:   Recurrent pulmonary emboli (HCC) No clear etiology. Has IVC filter in place. Doppler LE negative for DVT.   upper extremity Doppler negative for DVT. CT venogram shows entrapped thrombus in infrarenal IVC filter without caval occlusion. D/w IR who will review the images. Since there is no occlusion, suggested no intervention.   -needs lifelong anticoagulation. Transition to NOAC possibly in am and d/c home. -2D echo echo with normal EF and no RV strain.   Active Problems:  Elevate troponin -demand ischemia secondary to submassive PE. No chest pain symptoms or EKG changes. Echo with normal EF and no wall motion abnormality.  Chronic diastolic CHF (congestive heart failure): -compensated. Repeat echo with  normal EF. -Lasix held due to acute kidney injury -continue aspirin and metoprolol  Hypotension stable   AKI:  - prerenal secondary to dehydration and continuation of ARB and diruetics -Improved with IV fluids - Holding Lasix and Cozaar  OSA: -CPAP   Code Status : full code  Family Communication  : None at bedside  Disposition Plan  : home pending MRV result , if negative  discharge in a.m. on oral anticoagulation.  Barriers For Discharge : Pending MRV.  Consults  :  PCCM  Procedures  :  CT angio chest  echo MRV  DVT Prophylaxis  :  IV heparin  Lab Results  Component Value Date   PLT 217 06/11/2016    Antibiotics  :    Anti-infectives    None        Objective:   Vitals:   06/09/16 2121 06/10/16 0648 06/10/16 2018 06/11/16 0554  BP: 135/60 124/68 (!) 149/85 131/72  Pulse: (!) 113 66 87 68  Resp: 20 19 20 19   Temp: 98.5 F (36.9 C) 98.2 F (36.8 C) 98.8 F (37.1 C) 97.9 F (36.6 C)  TempSrc: Oral Oral Oral Oral  SpO2: 97% 99% 98% 100%  Weight:  (!) 155.8 kg (343 lb 7.6 oz)    Height:        Wt Readings from Last 3 Encounters:  06/10/16 (!) 155.8 kg (343 lb 7.6 oz)  05/05/16 (!) 147 kg (324 lb)  04/14/16 (!) 152.4 kg (336 lb)     Intake/Output Summary (Last 24 hours) at 06/11/16 1423 Last data filed at 06/11/16 0931  Gross per 24 hour  Intake              975 ml  Output                0 ml  Net              975 ml     Physical Exam  Gen: not in distress HEENT:  moist mucosa, supple neck Chest: Diminished breath sounds  CVS: S1&S2 normal,no murmurs,  GI: soft, NT, ND,  Musculoskeletal: warm, no edema     Data Review:    CBC  Recent Labs Lab 06/08/16 0215 06/09/16 0338 06/10/16 0536 06/11/16 0520  WBC 10.4 9.3 7.8 7.6  HGB 13.0 11.5* 11.0* 10.8*  HCT 41.1 36.8 35.0* 34.0*  PLT 207 218 213 217  MCV 91.1 90.9 90.2 87.4  MCH 28.8 28.4 28.4 27.8  MCHC 31.6 31.3 31.4 31.8  RDW 13.1 13.2 12.9 12.5  LYMPHSABS 1.1  --    --   --   MONOABS 0.8  --   --   --   EOSABS 0.1  --   --   --   BASOSABS 0.0  --   --   --     Chemistries   Recent Labs Lab 06/08/16 0215 06/09/16 0338 06/10/16 0536  NA 138 139 141  K 4.3 4.1 4.6  CL 105 106 111  CO2 25 25 25   GLUCOSE 242* 155* 134*  BUN 37* 44* 32*  CREATININE 1.91* 1.64* 1.09*  CALCIUM 9.1 8.7* 8.5*  AST 22  --   --   ALT 25  --   --   ALKPHOS 113  --   --   BILITOT 0.9  --   --    ------------------------------------------------------------------------------------------------------------------  Recent Labs  06/09/16 0338  CHOL 137  HDL 41  LDLCALC 84  TRIG 61  CHOLHDL 3.3    Lab Results  Component Value Date   HGBA1C 6.3 (H) 06/09/2016   ------------------------------------------------------------------------------------------------------------------ No results for input(s): TSH, T4TOTAL, T3FREE, THYROIDAB in the last 72 hours.  Invalid input(s): FREET3 ------------------------------------------------------------------------------------------------------------------ No results for input(s): VITAMINB12, FOLATE, FERRITIN, TIBC, IRON, RETICCTPCT in the last 72 hours.  Coagulation profile  Recent Labs Lab 06/09/16 0009  INR 1.13    No results for  input(s): DDIMER in the last 72 hours.  Cardiac Enzymes  Recent Labs Lab 06/08/16 0551 06/08/16 1103 06/08/16 1724  TROPONINI 0.62* 0.62* 0.57*   ------------------------------------------------------------------------------------------------------------------    Component Value Date/Time   BNP 35.9 06/08/2016 0551   BNP 184.7 (H) 04/14/2016 1714    Inpatient Medications  Scheduled Meds: . aspirin EC  81 mg Oral Daily  . atorvastatin  40 mg Oral q1800  . cholecalciferol  5,000 Units Oral Daily  . fluticasone  1 spray Each Nare Daily  . magnesium oxide  400 mg Oral Daily  . metoprolol  50 mg Oral BID  . minoxidil  2.5 mg Oral Daily  . sodium chloride flush  3 mL  Intravenous Q12H   Continuous Infusions: . heparin 1,500 Units/hr (06/11/16 0307)   PRN Meds:.acetaminophen, albuterol, hydrALAZINE, ondansetron **OR** ondansetron (ZOFRAN) IV, zolpidem  Micro Results Recent Results (from the past 240 hour(s))  MRSA PCR Screening     Status: None   Collection Time: 06/08/16  3:50 PM  Result Value Ref Range Status   MRSA by PCR NEGATIVE NEGATIVE Final    Comment:        The GeneXpert MRSA Assay (FDA approved for NASAL specimens only), is one component of a comprehensive MRSA colonization surveillance program. It is not intended to diagnose MRSA infection nor to guide or monitor treatment for MRSA infections.     Radiology Reports Dg Chest 2 View  Result Date: 06/08/2016 CLINICAL DATA:  65 y/o  F; shortness of breath. EXAM: CHEST  2 VIEW COMPARISON:  04/19/2016 chest radiograph FINDINGS: Low lung volumes. Stable cardiomegaly given projection and technique. No focal consolidation. No pleural effusion. Multilevel degenerative changes of thoracic spine. IVC filter. Upper abdominal surgical clips, presumably cholecystectomy. IMPRESSION: Stable cardiomegaly. Low lung volumes. No acute pulmonary process identified. Electronically Signed   By: Mitzi Hansen M.D.   On: 06/08/2016 02:46   Ct Angio Chest Pe W And/or Wo Contrast  Result Date: 06/08/2016 CLINICAL DATA:  65 y/o F; dyspnea and hypoxemia with history of pulmonary embolus. EXAM: CT ANGIOGRAPHY CHEST WITH CONTRAST TECHNIQUE: Multidetector CT imaging of the chest was performed using the standard protocol during bolus administration of intravenous contrast. Multiplanar CT image reconstructions and MIPs were obtained to evaluate the vascular anatomy. CONTRAST:  80 cc Isovue 370 COMPARISON:  CT angiogram of the chest dated 11/21/2012. FINDINGS: Cardiovascular: Satisfactory opacification of the pulmonary arteries to the segmental level. Saddle pulmonary embolus extending into multiple segmental  pulmonary arteries. RV/LV equals 1.2. Moderate cardiomegaly. No pericardial effusion. Normal caliber thoracic aorta. Mild aortic atherosclerosis with arch calcifications. Enlarged main pulmonary artery measuring 37 mm indicating pulmonary artery hypertension. Mediastinum/Nodes: No enlarged mediastinal, hilar, or axillary lymph nodes. Thyroid gland, trachea, and esophagus demonstrate no significant findings. Lungs/Pleura: Stable 3 mm right middle lobe nodule (series 7, image 42), no additional recommendations for follow-up. No consolidation of the lungs. No pleural effusion. Upper Abdomen: No acute abnormality. Musculoskeletal: No chest wall abnormality. No acute or significant osseous findings. Review of the MIP images confirms the above findings. IMPRESSION: Positive for acute PE with saddle pulmonary embolus and CTevidence of right heart strain (RV/LV Ratio = 1.2) consistent with at least submassive (intermediate risk) PE. The presence of right heart strain has been associated with an increased risk of morbidity and mortality. These results were called by telephone at the time of interpretation on 06/08/2016 at 4:19 am to Dr. Alona Bene , who verbally acknowledged these results. Electronically Signed   By:  Mitzi HansenLance  Furusawa-Stratton M.D.   On: 06/08/2016 04:21   Ct Venogram Abd/pel  Result Date: 06/11/2016 CLINICAL DATA:  medical history significant of DVT, PE, ( after hip surgery in 10/2012) s/p of IVC placement in 2014 ( unable to remove it due to body habitus) hypertension, hyperlipidemia, OSA on CPAP, right bundle blockage, morbid obesity, who presents with 1 day history of shortness of breath. Pt tachycardic and hypotensive with elevated tropinin , AKI and found to have submassive PE on CT angio chest with rt heart strain. EXAM: CT VENOGRAM ABD-PELVIS TECHNIQUE: Multidetector CT imaging of the abdomen and pelvis was performed using the venous phase protocol after bolus administration of intravenous  contrast. Multiplanar reconstructed images and MIPs were obtained and reviewed to evaluate the vascular anatomy. CONTRAST:  150mL ISOVUE-300 IOPAMIDOL (ISOVUE-300) INJECTION 61% COMPARISON:  CT chest 06/08/2016 FINDINGS: VASCULAR Aorta: Scattered atheromatous plaque. No aneurysm, dissection, or stenosis. Celiac: Diminutive, patent SMA: Patent Renals: Both renal arteries are patent without evidence of aneurysm, dissection, vasculitis, fibromuscular dysplasia or significant stenosis. IMA: Patent without evidence of aneurysm, dissection, vasculitis or significant stenosis. Inflow: Scattered nonocclusive plaque in bilateral common iliac arteries. No aneurysm, dissection, or high-grade stenosis evident. Proximal Outflow: Bilateral common femoral and visualized portions of the superficial and profunda femoral arteries are patent without evidence of aneurysm, dissection, vasculitis or significant stenosis. Veins: Patent portal vein, splenic vein, superior mesenteric vein. Patency of the visualized common femoral veins and iliac venous system. IVC patent. Good position of infrarenal IVC filter without any fracture or caval wall penetration. There is some entrapped thrombus within the central aspect of the legs and around the apex of the filter, without caval occlusion. Patent bilateral renal veins. Patent hepatic veins. Review of the MIP images confirms the above findings. NON-VASCULAR Lower chest: Minimal dependent subsegmental atelectasis. Filling defect in left lower lobe pulmonary arterial branch is suspected, as was seen on previous CTA. No pleural or pericardial effusion. Hepatobiliary: No focal liver abnormality is seen. No gallstones, gallbladder wall thickening, or biliary dilatation. Pancreas: Unremarkable. No pancreatic ductal dilatation or surrounding inflammatory changes. Spleen: Normal in size without focal abnormality. Adrenals/Urinary Tract: Adrenal glands are unremarkable. Kidneys are normal, without renal  calculi, focal lesion, or hydronephrosis. Bladder is unremarkable. Stomach/Bowel: Stomach and small bowel are nondilated. Appendix not identified. Colon is nondilated. Lymphatic: No adenopathy localized. Reproductive: Status post hysterectomy. No adnexal masses. Other: No ascites.  No free air. Musculoskeletal: Umbilical hernia containing only mesenteric fat. Spondylitic changes in the mid lumbar spine. Degenerative changes in the right hip. IMPRESSION: VASCULAR 1. Entrapped thrombus in the infrarenal IVC filter without caval occlusion. 2. Negative for iliac venous thrombus. 3. Scattered aortoiliac arterial plaque without aneurysm or stenosis. 4. Left lower lobe pulmonary embolus, as previously demonstrated. These results will be called to the ordering clinician or representative by the Radiologist Assistant, and communication documented in the PACS or zVision Dashboard. NON-VASCULAR 1. No acute findings. 2. Umbilical hernia containing only  mesenteric fat. Electronically Signed   By: Corlis Leak  Hassell M.D.   On: 06/11/2016 12:59    Time Spent in minutes  25   Eddie NorthHUNGEL, Meeka Cartelli M.D on 06/11/2016 at 2:23 PM  Between 7am to 7pm - Pager - [phone]  After 7pm go to [email or web address] - password [email or web address]  [facility] -  Office  [phone]

## 2016-06-11 NOTE — Progress Notes (Signed)
ANTICOAGULATION CONSULT NOTE - Follow-Up Consult  Pharmacy Consult for Heparin Indication: chest pain/ACS and pulmonary embolus  Allergies  Allergen Reactions  . Codeine Other (See Comments)    Unknown  . Hydrocodone     Itch  . Oxycodone Rash  . Tramadol Rash    Patient Measurements: Height: 5\' 2"  (157.5 cm) Weight: (!) 343 lb 7.6 oz (155.8 kg) IBW/kg (Calculated) : 50.1 Heparin Dosing Weight: 89kg  Vital Signs: Temp: 97.9 F (36.6 C) (12/16 0554) Temp Source: Oral (12/16 0554) BP: 131/72 (12/16 0554) Pulse Rate: 68 (12/16 0554)  Labs:  Recent Labs  06/08/16 1103  06/08/16 1724 06/09/16 0009  06/09/16 0338 06/09/16 1010 06/10/16 0536 06/11/16 0520  HGB  --   --   --   --   < > 11.5*  --  11.0* 10.8*  HCT  --   --   --   --   --  36.8  --  35.0* 34.0*  PLT  --   --   --   --   --  218  --  213 217  LABPROT  --   --   --  14.6  --   --   --   --   --   INR  --   --   --  1.13  --   --   --   --   --   HEPARINUNFRC  --   < >  --  0.69  --  0.64 0.60 0.56 0.44  CREATININE  --   --   --   --   --  1.64*  --  1.09*  --   TROPONINI 0.62*  --  0.57*  --   --   --   --   --   --   < > = values in this interval not displayed.  Estimated Creatinine Clearance: 77.1 mL/min (by C-G formula based on SCr of 1.09 mg/dL (H)).   Medical History: Past Medical History:  Diagnosis Date  . Acute meniscal tear of knee    RIGHT  . Arthritis    "right knee" (11/21/2012)  . Elevated hemoglobin A1c   . Exertional shortness of breath    "this week" (11/21/2012)  . History of DVT (deep vein thrombosis)    IN PREGNANCY  . HTN (hypertension)    CARDIOLOGIST--  DR HOCHREIN-- CLEARANCE NOTE IN EPIC AND CHART FROM 08-21-2012  . Mixed hyperlipidemia   . Obesity   . Pulmonary embolism (HCC)    "3 today" (11/21/2012)  . RBBB (right bundle branch block with left anterior fascicular block)   . Seasonal allergies   . Sinus tachycardia   . Sleep apnea   . Vitamin D deficiency      Medications:  Infusions:  . heparin 1,500 Units/hr (06/11/16 0307)    Assessment: [age] yoF presents with SOB. She has history of PE. Heparin ordered for PE, prior heparin bolus for ACS ordered due to + Troponin.  CTA confirms saddle PE with evidence of R heart strain per CT but ECHO did not reveal any acute abnormalities.   Today, 06/11/16:   Hgb stable, plts WNL  HL continues to be therapeutic at current rate of 1500 units/hr  SCr is elevated but improving, down to 1.09 today   No bleeding noted and no line issues per RN  Goal of Therapy:  Heparin level 0.3-0.7 units/ml Monitor platelets by anticoagulation protocol: Yes   Plan:   Continue heparin  drip at 1500 units/hr  Recheck HL with am labs  Daily CBC and heparin level  Await plan for long-term anticoagulation (follow renal function if decision made for DOAC)    Hessie KnowsJustin M Jahmia Berrett, PharmD, BCPS Pager [phone] 06/11/2016 8:56 AM

## 2016-06-11 NOTE — Progress Notes (Signed)
Patient gas home CPAP

## 2016-06-12 DIAGNOSIS — J9601 Acute respiratory failure with hypoxia: Secondary | ICD-10-CM

## 2016-06-12 LAB — HEPARIN LEVEL (UNFRACTIONATED): Heparin Unfractionated: 0.45 IU/mL (ref 0.30–0.70)

## 2016-06-12 LAB — CBC
HEMATOCRIT: 34.6 % — AB (ref 36.0–46.0)
Hemoglobin: 11.1 g/dL — ABNORMAL LOW (ref 12.0–15.0)
MCH: 28.6 pg (ref 26.0–34.0)
MCHC: 32.1 g/dL (ref 30.0–36.0)
MCV: 89.2 fL (ref 78.0–100.0)
PLATELETS: 224 10*3/uL (ref 150–400)
RBC: 3.88 MIL/uL (ref 3.87–5.11)
RDW: 12.8 % (ref 11.5–15.5)
WBC: 7 10*3/uL (ref 4.0–10.5)

## 2016-06-12 LAB — GLUCOSE, CAPILLARY: Glucose-Capillary: 111 mg/dL — ABNORMAL HIGH (ref 65–99)

## 2016-06-12 MED ORDER — APIXABAN 5 MG PO TABS: 5.0000 mg | ORAL_TABLET | Freq: Two times a day (BID) | ORAL | Status: DC

## 2016-06-12 MED ORDER — APIXABAN 5 MG PO TABS: 10.0000 mg | ORAL_TABLET | Freq: Two times a day (BID) | ORAL | 0 refills | Status: DC

## 2016-06-12 MED ORDER — APIXABAN 5 MG PO TABS
10.0000 mg | ORAL_TABLET | Freq: Two times a day (BID) | ORAL | Status: DC
  Administered 2016-06-12: 10 mg via ORAL
  Filled 2016-06-12: qty 2

## 2016-06-12 MED ORDER — APIXABAN 5 MG PO TABS: ORAL_TABLET | ORAL | 0 refills | Status: DC

## 2016-06-12 MED ORDER — APIXABAN (ELIQUIS) EDUCATION KIT FOR DVT/PE PATIENTS: 1.0000 | PACK | Freq: Once | 0 refills | Status: AC

## 2016-06-12 MED ORDER — APIXABAN 5 MG PO TABS: 5.0000 mg | ORAL_TABLET | Freq: Two times a day (BID) | ORAL | 0 refills | Status: DC

## 2016-06-12 NOTE — Discharge Instructions (Addendum)
Apixaban oral tablets °What is this medicine? °APIXABAN (a PIX a ban) is an anticoagulant (blood thinner). It is used to lower the chance of stroke in people with a medical condition called atrial fibrillation. It is also used to treat or prevent blood clots in the lungs or in the veins. °COMMON BRAND NAME(S): Eliquis °What should I tell my health care provider before I take this medicine? °They need to know if you have any of these conditions: °-bleeding disorders °-bleeding in the brain °-blood in your stools (black or tarry stools) or if you have blood in your vomit °-history of stomach bleeding °-kidney disease °-liver disease °-mechanical heart valve °-an unusual or allergic reaction to apixaban, other medicines, foods, dyes, or preservatives °-pregnant or trying to get pregnant °-breast-feeding °How should I use this medicine? °Take this medicine by mouth with a glass of water. Follow the directions on the prescription label. You can take it with or without food. If it upsets your stomach, take it with food. Take your medicine at regular intervals. Do not take it more often than directed. Do not stop taking except on your doctor's advice. Stopping this medicine may increase your risk of a blot clot. Be sure to refill your prescription before you run out of medicine. °Talk to your pediatrician regarding the use of this medicine in children. Special care may be needed. °What if I miss a dose? °If you miss a dose, take it as soon as you can. If it is almost time for your next dose, take only that dose. Do not take double or extra doses. °What may interact with this medicine? °This medicine may interact with the following: °-aspirin and aspirin-like medicines °-certain medicines for fungal infections like ketoconazole and itraconazole °-certain medicines for seizures like carbamazepine and phenytoin °-certain medicines that treat or prevent blood clots like warfarin, enoxaparin, and  dalteparin °-clarithromycin °-NSAIDs, medicines for pain and inflammation, like ibuprofen or naproxen °-rifampin °-ritonavir °-St. John's wort °What should I watch for while using this medicine? °Notify your doctor or health care professional and seek emergency treatment if you develop breathing problems; changes in vision; chest pain; severe, sudden headache; pain, swelling, warmth in the leg; trouble speaking; sudden numbness or weakness of the face, arm, or leg. These can be signs that your condition has gotten worse. °If you are going to have surgery, tell your doctor or health care professional that you are taking this medicine. °Tell your health care professional that you use this medicine before you have a spinal or epidural procedure. Sometimes people who take this medicine have bleeding problems around the spine when they have a spinal or epidural procedure. This bleeding is very rare. If you have a spinal or epidural procedure while on this medicine, call your health care professional immediately if you have back pain, numbness or tingling (especially in your legs and feet), muscle weakness, paralysis, or loss of bladder or bowel control. °Avoid sports and activities that might cause injury while you are using this medicine. Severe falls or injuries can cause unseen bleeding. Be careful when using sharp tools or knives. Consider using an electric razor. Take special care brushing or flossing your teeth. Report any injuries, bruising, or red spots on the skin to your doctor or health care professional. °What side effects may I notice from receiving this medicine? °Side effects that you should report to your doctor or health care professional as soon as possible: °-allergic reactions like skin rash, itching or hives, swelling   of the face, lips, or tongue -signs and symptoms of bleeding such as bloody or black, tarry stools; red or dark-brown urine; spitting up blood or brown material that looks like coffee  grounds; red spots on the skin; unusual bruising or bleeding from the eye, gums, or nose Where should I keep my medicine? Keep out of the reach of children. Store at room temperature between 20 and 25 degrees C (68 and 77 degrees F). Throw away any unused medicine after the expiration date.  2017 Elsevier/Gold Standard (2015-07-16 09:26:49)  Information on my medicine - ELIQUIS (apixaban)  This medication education was reviewed with me or my healthcare representative as part of my discharge preparation.  The pharmacist that spoke with me during my hospital stay was:  Berkley HarveyLegge, Abrian Hanover Marshall, [facility]PH  Why was Eliquis prescribed for you? Eliquis was prescribed to treat blood clots that may have been found in the veins of your legs (deep vein thrombosis) or in your lungs (pulmonary embolism) and to reduce the risk of them occurring again.  What do You need to know about Eliquis ? The starting dose is 10 mg (two 5 mg tablets) taken TWICE daily for the FIRST SEVEN (7) DAYS, then on 06/19/16 the dose is reduced to ONE 5 mg tablet taken TWICE daily.  Eliquis may be taken with or without food.   Try to take the dose about the same time in the morning and in the evening. If you have difficulty swallowing the tablet whole please discuss with your pharmacist how to take the medication safely.  Take Eliquis exactly as prescribed and DO NOT stop taking Eliquis without talking to the doctor who prescribed the medication.  Stopping may increase your risk of developing a new blood clot.  Refill your prescription before you run out.  After discharge, you should have regular check-up appointments with your healthcare provider that is prescribing your Eliquis.    What do you do if you miss a dose? If a dose of ELIQUIS is not taken at the scheduled time, take it as soon as possible on the same day and twice-daily administration should be resumed. The dose should not be doubled to make up for a missed  dose.  Important Safety Information A possible side effect of Eliquis is bleeding. You should call your healthcare provider right away if you experience any of the following: ? Bleeding from an injury or your nose that does not stop. ? Unusual colored urine (red or dark brown) or unusual colored stools (red or black). ? Unusual bruising for unknown reasons. ? A serious fall or if you hit your head (even if there is no bleeding).  Some medicines may interact with Eliquis and might increase your risk of bleeding or clotting while on Eliquis. To help avoid this, consult your healthcare provider or pharmacist prior to using any new prescription or non-prescription medications, including herbals, vitamins, non-steroidal anti-inflammatory drugs (NSAIDs) and supplements.  This website has more information on Eliquis (apixaban): http://www.eliquis.com/eliquis/home

## 2016-06-12 NOTE — Care Management Note (Signed)
Case Management Note  Patient Details  Name: Kerry Liu MRN: [id number] Date of Birth: 11/28/1950  Subjective/Objective:  Pt verbalized understanding of 30D free card for Eliquis and instructions given by MD.                   Action/Plan:CM will sign off for now but will be available should additional discharge needs arise or disposition change.    Expected Discharge Date:   (unknown)               Expected Discharge Plan:  Home w Home Health Services  In-House Referral:  NA  Discharge planning Services  CM Consult, Medication Assistance (Eliquis 30Day free Card given.)  Post Acute Care Choice:  Home Health Choice offered to:  Patient  DME Arranged:  3-N-1, Cane DME Agency:  [facility]  [facility]  Status of Service:  Completed, signed off  If discussed at Long Length of Stay Meetings, dates discussed:    Additional Comments:  Yvone NeuCrutchfield, Fareedah Mahler M, RN 06/12/2016, 3:13 PM

## 2016-06-12 NOTE — Progress Notes (Signed)
Patient discharged home.  Leaving with personal belonging, prescriptions. [facility], and cane.  Reports understanding of discharge instructions.  Room air, denies pain, no s/s of distress.  Accompanied by granddaughter.  No complaints.

## 2016-06-12 NOTE — Progress Notes (Addendum)
ANTICOAGULATION CONSULT NOTE - Follow-Up Consult  Pharmacy Consult for Heparin - transition to apixaban Indication: chest pain/ACS and pulmonary embolus  Allergies  Allergen Reactions  . Codeine Other (See Comments)    Unknown  . Hydrocodone     Itch  . Oxycodone Rash  . Tramadol Rash    Patient Measurements: Height: 5\' 2"  (157.5 cm) Weight: (!) 343 lb 7.6 oz (155.8 kg) IBW/kg (Calculated) : 50.1 Heparin Dosing Weight: 89kg  Vital Signs: Temp: 98 F (36.7 C) (12/17 0442) Temp Source: Oral (12/17 0442) BP: 113/61 (12/17 0442) Pulse Rate: 70 (12/17 0442)  Labs:  Recent Labs  06/10/16 0536 06/11/16 0520 06/12/16 0548  HGB 11.0* 10.8* 11.1*  HCT 35.0* 34.0* 34.6*  PLT 213 217 224  HEPARINUNFRC 0.56 0.44 0.45  CREATININE 1.09*  --   --     Estimated Creatinine Clearance: 77.1 mL/min (by C-G formula based on SCr of 1.09 mg/dL (H)).   Medical History: Past Medical History:  Diagnosis Date  . Acute meniscal tear of knee    RIGHT  . Arthritis    "right knee" (11/21/2012)  . Elevated hemoglobin A1c   . Exertional shortness of breath    "this week" (11/21/2012)  . History of DVT (deep vein thrombosis)    IN PREGNANCY  . HTN (hypertension)    CARDIOLOGIST--  DR HOCHREIN-- CLEARANCE NOTE IN EPIC AND CHART FROM 08-21-2012  . Mixed hyperlipidemia   . Obesity   . Pulmonary embolism (HCC)    "3 today" (11/21/2012)  . RBBB (right bundle branch block with left anterior fascicular block)   . Seasonal allergies   . Sinus tachycardia   . Sleep apnea   . Vitamin D deficiency     Medications:  Infusions:  . heparin 1,500 Units/hr (06/11/16 1901)    Assessment: [age] yoF presents with SOB. She has history of PE. Heparin ordered for PE, prior heparin bolus for ACS ordered due to + Troponin.  CTA confirms saddle PE with evidence of R heart strain per CT but ECHO did not reveal any acute abnormalities.   Today, 06/12/16:   Hgb stable, plts WNL  HL continues to be  therapeutic at current rate of 1500 units/hr  SCr is elevated but improving, down to 1.09 today   No bleeding noted and no line issues per RN  To d/c IV heparin and change to apixaban  Goal of Therapy:  Heparin level 0.3-0.7 units/ml Monitor platelets by anticoagulation protocol: Yes   Plan:  Discontinue heparin drip then start apixaban 10mg  BID x 7 days then 5mg  BID thereafter Will counsel prior to discharge   Hessie KnowsJustin M Laiza Veenstra, PharmD, BCPS Pager [phone] 06/12/2016 10:00 AM

## 2016-06-12 NOTE — Discharge Summary (Addendum)
Physician Discharge Summary  Kerry Liu JHE:[id number] DOB: 09-Jun-1951 DOA: 06/08/2016  PCP: Alesia Richards, MD  Admit date: 06/08/2016 Discharge date: 06/12/2016  Admitted From: Home Disposition:  Home  Recommendations for Outpatient Follow-up:  1. Follow up with PCP in 1 week. Please monitor H&H as outpatient. 2. Patient is being discharged on eliquis for submassive pulmonary embolism. She needs to be on anticoagulation lifelong.  Home Health: RN Equipment/Devices: CPAP at night  Discharge Condition: Fair CODE STATUS:Full code Diet recommendation: Heart healthy   Discharge Diagnoses:  Principal Problem:   Recurrent /submassive pulmonary emboli [facility])  Active Problems:   Morbid obesity ([place])   AKI (acute kidney injury) ([place])   Essential hypertension   Mixed hyperlipidemia   Pulmonary embolism (HCC)   OSA on CPAP   Chronic diastolic CHF (congestive heart failure) (HCC)   Elevated troponin   Acute respiratory failure with hypoxemia (HCC)   Systemic inflammatory response syndrome (SIRS)  Brief narrative/history of present illness 65 y.o.morbidly obese femalewith medical history significant of DVT, PE, ( after hip surgery in 10/2012) s/p of IVC placement in 2014 ( unable to remove it due to body habitus) hypertension, hyperlipidemia, OSA on CPAP, right bundle blockage, morbid obesity, who presents with 1 day history of shortness of breath. Pt tachycardic and hypotensive with elevated tropinin , AKI and  found to have submassive PE on CT angio chest with rt heart strain. Admitted to ICU.  Principal Problem:   Recurrent / submassive pulmonary emboli (HCC)  Has IVC filter in place. Doppler LE negative for DVT.   upper extremity Doppler negative for DVT. -2D echo echo with normal EF and no RV strain. -CT venogram done shows entrapped thrombus in infrarenal IVC filter without caval occlusion. D/w IR who reviewed the images.. Since there is no occlusion, suggested no  intervention. Discussed finding with pulmonary (Dr. Annamaria Boots) who agrees with the same.  -needs lifelong anticoagulation. Transition to oral apixaban and discharge home.  Patient provided education and resources on long-term anticoagulation, including benefits, side effects and need for outpatient follow-up.    Active Problems:  Elevate troponin -demand ischemia secondary to submassive PE. No chest pain symptoms or EKG changes. Echo with normal EF and no wall motion abnormality.  Chronic diastolic CHF (congestive heart failure): -compensated. Repeat echo with normal EF. -Lasix held due to acute kidney injury -continue aspirin and metoprolol  Hypotension Likely an error due to inappropriate blood pressure cuff size. Stable after cuff adjusted.   AKI: - prerenal secondary to dehydration and continuation of ARBand diruetics -Improved with IV fluids -resume lasix and ARB.  OSA: -On nighttime CPAP  Morbid obesity  Counseled on diet and exercise to lose weight.   Code Status : full code  Family Communication  : None at bedside, daughter updated on the phone  Disposition Plan  : home     Consults  :  PCCM  Procedures  :  CT angio chest  echo MRV  Discharge Instructions   Allergies as of 06/12/2016      Reactions   Codeine Other (See Comments)   Unknown   Hydrocodone    Itch   Oxycodone Rash   Tramadol Rash      Medication List    TAKE these medications   apixaban 5 MG Tabs tablet Commonly known as:  ELIQUIS Take 2 tablets (10 mg total) by mouth 2 (two) times daily for 7 days then starting 12/24 , take 1 tablet (51m) 2 times daily.  apixaban Kit Commonly known as:  ELIQUIS 1 kit by Does not apply route once.   aspirin EC 81 MG tablet Take 1 tablet (81 mg total) by mouth daily.   atorvastatin 80 MG tablet Commonly known as:  LIPITOR Take 1/2 to 1 tablet daily or as directed for Cholesterol   furosemide 40 MG tablet Commonly  known as:  LASIX Take 1 tablet 2 x/ day for BP & Fluid   losartan 100 MG tablet Commonly known as:  COZAAR Take 1 tablet daily for BP   Magnesium Oxide 250 MG Tabs Take 1 tablet by mouth daily.   metoprolol 50 MG tablet Commonly known as:  LOPRESSOR TAKE ONE TABLET BY MOUTH TWICE DAILY   minoxidil 10 MG tablet Commonly known as:  LONITEN Take 1/2 to 1 tablet daily as directed for BP   mometasone 50 MCG/ACT nasal spray Commonly known as:  NASONEX Place 2 sprays into the nose daily.   PROAIR HFA 108 (90 Base) MCG/ACT inhaler Generic drug:  albuterol Inhale 1-2 puffs into the lungs every 4 (four) hours as needed for shortness of breath.   TYLENOL 500 MG tablet Generic drug:  acetaminophen Take 500 mg by mouth every 6 (six) hours as needed for pain.   Vitamin D3 5000 units Caps Take 1 capsule by mouth daily.      Follow-up Information    MCKEOWN,WILLIAM DAVID, MD. Schedule an appointment as soon as possible for a visit in 1 week(s).   Specialty:  Internal Medicine Contact information: [address] [place] [address] [phone]          Allergies  Allergen Reactions  . Codeine Other (See Comments)    Unknown  . Hydrocodone     Itch  . Oxycodone Rash  . Tramadol Rash       Procedures/Studies: Dg Chest 2 View  Result Date: 06/08/2016 CLINICAL DATA:  65 y/o  F; shortness of breath. EXAM: CHEST  2 VIEW COMPARISON:  04/19/2016 chest radiograph FINDINGS: Low lung volumes. Stable cardiomegaly given projection and technique. No focal consolidation. No pleural effusion. Multilevel degenerative changes of thoracic spine. IVC filter. Upper abdominal surgical clips, presumably cholecystectomy. IMPRESSION: Stable cardiomegaly. Low lung volumes. No acute pulmonary process identified. Electronically Signed   By: Kristine Garbe M.D.   On: 06/08/2016 02:46   Ct Angio Chest Pe W And/or Wo Contrast  Result Date: 06/08/2016 CLINICAL DATA:   65 y/o F; dyspnea and hypoxemia with history with history of pulmonary embolus. EXAM: CT ANGIOGRAPHY CHEST WITH CONTRAST TECHNIQUE: Multidetector CT imaging of the chest was performed using the standard protocol during bolus administration of intravenous contrast. Multiplanar CT image reconstructions and MIPs were obtained to evaluate the vascular anatomy. CONTRAST:  80 cc Isovue 370 COMPARISON:  CT angiogram of the chest dated 11/21/2012. FINDINGS: Cardiovascular: Satisfactory opacification of the pulmonary arteries to the segmental level. Saddle pulmonary embolus extending into multiple segmental pulmonary arteries. RV/LV equals 1.2. Moderate cardiomegaly. No pericardial effusion. Normal caliber thoracic aorta. Mild aortic atherosclerosis with arch calcifications. Enlarged main pulmonary artery measuring 37 mm indicating pulmonary artery hypertension. Mediastinum/Nodes: No enlarged mediastinal, hilar, or axillary lymph nodes. Thyroid gland, trachea, and esophagus demonstrate no significant findings. Lungs/Pleura: Stable 3 mm right middle lobe nodule (series 7, image 42), no additional recommendations for follow-up. No consolidation of the lungs. No pleural effusion. Upper Abdomen: No acute abnormality. Musculoskeletal: No chest wall abnormality. No acute or significant osseous findings. Review of the MIP images confirms the above findings.  IMPRESSION: Positive for acute PE with saddle pulmonary embolus and CTevidence of right heart strain (RV/LV Ratio = 1.2) consistent with at least submassive (intermediate risk) PE. The presence of right heart strain has been associated with an increased risk of morbidity and mortality. These results were called by telephone at the time of interpretation on 06/08/2016 at 4:19 am to Dr. Nanda Quinton , who verbally acknowledged these results. Electronically Signed   By: Kristine Garbe M.D.   On: 06/08/2016 04:21   Ct Venogram Abd/pel  Result Date: 06/11/2016 CLINICAL DATA:   medical history significant of DVT, PE, ( after hip surgery in 10/2012) s/p of IVC placement in 2014 ( unable to remove it due to body habitus) hypertension, hyperlipidemia, OSA on CPAP, right bundle blockage, morbid obesity, who presents with 1 day history of shortness of breath. Pt tachycardic and hypotensive with elevated tropinin , AKI and found to have submassive PE on CT angio chest with rt heart strain. EXAM: CT VENOGRAM ABD-PELVIS TECHNIQUE: Multidetector CT imaging of the abdomen and pelvis was performed using the venous phase protocol after bolus administration of intravenous contrast. Multiplanar reconstructed images and MIPs were obtained and reviewed to evaluate the vascular anatomy. CONTRAST:  165m ISOVUE-300 IOPAMIDOL (ISOVUE-300) INJECTION 61% COMPARISON:  CT chest 06/08/2016 FINDINGS: VASCULAR Aorta: Scattered atheromatous plaque. No aneurysm, dissection, or stenosis. Celiac: Diminutive, patent SMA: Patent Renals: Both renal arteries are patent without evidence of aneurysm, dissection, vasculitis, fibromuscular dysplasia or significant stenosis. IMA: Patent without evidence of aneurysm, dissection, vasculitis or significant stenosis. Inflow: Scattered nonocclusive plaque in bilateral common iliac arteries. No aneurysm, dissection, or high-grade stenosis evident. Proximal Outflow: Bilateral common femoral and visualized portions of the superficial and profunda femoral arteries are patent without evidence of aneurysm, dissection, vasculitis or significant stenosis. Veins: Patent portal vein, splenic vein, superior mesenteric vein. Patency of the visualized common femoral veins and iliac venous system. IVC patent. Good position of infrarenal IVC filter without any fracture or caval wall penetration. There is some entrapped thrombus within the central aspect of the legs and around the apex of the filter, without caval occlusion. Patent bilateral renal veins. Patent hepatic veins. Review of the MIP  images confirms the above findings. NON-VASCULAR Lower chest: Minimal dependent subsegmental atelectasis. Filling defect in left lower lobe pulmonary arterial branch is suspected, as was seen on previous CTA. No pleural or pericardial effusion. Hepatobiliary: No focal liver abnormality is seen. No gallstones, gallbladder wall thickening, or biliary dilatation. Pancreas: Unremarkable. No pancreatic ductal dilatation or surrounding inflammatory changes. Spleen: Normal in size without focal abnormality. Adrenals/Urinary Tract: Adrenal glands are unremarkable. Kidneys are normal, without renal calculi, focal lesion, or hydronephrosis. Bladder is unremarkable. Stomach/Bowel: Stomach and small bowel are nondilated. Appendix not identified. Colon is nondilated. Lymphatic: No adenopathy localized. Reproductive: Status post hysterectomy. No adnexal masses. Other: No ascites.  No free air. Musculoskeletal: Umbilical hernia containing only mesenteric fat. Spondylitic changes in the mid lumbar spine. Degenerative changes in the right hip. IMPRESSION: VASCULAR 1. Entrapped thrombus in the infrarenal IVC filter without caval occlusion. 2. Negative for iliac venous thrombus. 3. Scattered aortoiliac arterial plaque without aneurysm or stenosis. 4. Left lower lobe pulmonary embolus, as previously demonstrated. These results will be called to the ordering clinician or representative by the Radiologist Assistant, and communication documented in the PACS or zVision Dashboard. NON-VASCULAR 1. No acute findings. 2. Umbilical hernia containing only  mesenteric fat. Electronically Signed   By: DLucrezia EuropeM.D.   On: 06/11/2016 12:59  2-D echo Study Conclusions  - Left ventricle: The cavity size was normal. Wall thickness was   increased in a pattern of severe LVH. Systolic function was   vigorous. The estimated ejection fraction was in the range of 65%   to 70%. Wall motion was normal; there were no regional wall   motion  abnormalities. Doppler parameters are consistent with   abnormal left ventricular relaxation (grade 1 diastolic   dysfunction). - Left atrium: The atrium was mildly dilated.  Subjective: Denies shortness of breath or chest discomfort. No overnight issues.  Discharge Exam: Vitals:   06/11/16 2011 06/12/16 0442  BP: 133/85 113/61  Pulse: 72 70  Resp: 19 18  Temp: 98.5 F (36.9 C) 98 F (36.7 C)   Vitals:   06/11/16 1709 06/11/16 2011 06/12/16 0442 06/12/16 0534  BP: (!) 152/76 133/85 113/61   Pulse: 74 72 70   Resp: _0 Temp: 98 F (36.7 C) 98.5 F (36.9 C) 98 F (36.7 C)   TempSrc: Oral Oral Oral   SpO2: 97% 100% 97%   Weight:    (!) 155.8 kg (343 lb 7.6 oz)  Height:       Gen: morbidly obese female not in distress HEENT:  moist mucosa, supple neck Chest: Diminished breath sounds due to body habitus CVS: S1&S2 normal,no murmurs,  GI: soft, NT, ND,  Musculoskeletal: warm, no edema     The results of significant diagnostics from this hospitalization (including imaging, microbiology, ancillary and laboratory) are listed below for reference.     Microbiology: Recent Results (from the past 240 hour(s))  MRSA PCR Screening     Status: None   Collection Time: 06/08/16  3:50 PM  Result Value Ref Range Status   MRSA by PCR NEGATIVE NEGATIVE Final    Comment:        The GeneXpert MRSA Assay (FDA approved for NASAL specimens only), is one component of a comprehensive MRSA colonization surveillance program. It is not intended to diagnose MRSA infection nor to guide or monitor treatment for MRSA infections.      Labs: BNP (last 3 results)  Recent Labs  04/14/16 1714 06/08/16 0551  BNP 184.7* 10.1   Basic Metabolic Panel:  Recent Labs Lab 06/08/16 0215 06/09/16 0338 06/10/16 0536  NA 138 139 141  K 4.3 4.1 4.6  CL 105 106 111  CO2 _1 GLUCOSE 242* 155* 134*  BUN 37* 44* 32*  CREATININE 1.91* 1.64* 1.09*  CALCIUM 9.1 8.7* 8.5*    Liver Function Tests:  Recent Labs Lab 06/08/16 0215  AST 22  ALT 25  ALKPHOS 113  BILITOT 0.9  PROT 7.5  ALBUMIN 3.6    Recent Labs Lab 06/08/16 0215  LIPASE 27   No results for input(s): AMMONIA in the last 168 hours. CBC:  Recent Labs Lab 06/08/16 0215 06/09/16 0338 06/10/16 0536 06/11/16 0520 06/12/16 0548  WBC 10.4 9.3 7.8 7.6 7.0  NEUTROABS 8.4*  --   --   --   --   HGB 13.0 11.5* 11.0* 10.8* 11.1*  HCT 41.1 36.8 35.0* 34.0* 34.6*  MCV 91.1 90.9 90.2 87.4 89.2  PLT 207 218 213 217 224   Cardiac Enzymes:  Recent Labs Lab 06/08/16 0551 06/08/16 1103 06/08/16 1724  TROPONINI 0.62* 0.62* 0.57*   BNP: Invalid input(s): POCBNP CBG:  Recent Labs Lab 06/08/16 0921 06/09/16 0809 06/10/16 0752 06/11/16 0734 06/12/16 0727  GLUCAP 207* 132* 203* 186* 111*  D-Dimer No results for input(s): DDIMER in the last 72 hours. Hgb A1c No results for input(s): HGBA1C in the last 72 hours. Lipid Profile No results for input(s): CHOL, HDL, LDLCALC, TRIG, CHOLHDL, LDLDIRECT in the last 72 hours. Thyroid function studies No results for input(s): TSH, T4TOTAL, T3FREE, THYROIDAB in the last 72 hours.  Invalid input(s): FREET3 Anemia work up No results for input(s): VITAMINB12, FOLATE, FERRITIN, TIBC, IRON, RETICCTPCT in the last 72 hours. Urinalysis    Component Value Date/Time   COLORURINE YELLOW 10/22/2015 1608   APPEARANCEUR CLEAR 10/22/2015 1608   LABSPEC 1.023 10/22/2015 1608   PHURINE 7.5 10/22/2015 1608   GLUCOSEU NEGATIVE 10/22/2015 1608   HGBUR NEGATIVE 10/22/2015 1608   BILIRUBINUR NEGATIVE 10/22/2015 1608   KETONESUR NEGATIVE 10/22/2015 1608   PROTEINUR NEGATIVE 10/22/2015 1608   NITRITE NEGATIVE 10/22/2015 1608   LEUKOCYTESUR NEGATIVE 10/22/2015 1608   Sepsis Labs Invalid input(s): PROCALCITONIN,  WBC,  LACTICIDVEN Microbiology Recent Results (from the past 240 hour(s))  MRSA PCR Screening     Status: None   Collection Time: 06/08/16   3:50 PM  Result Value Ref Range Status   MRSA by PCR NEGATIVE NEGATIVE Final    Comment:        The GeneXpert MRSA Assay (FDA approved for NASAL specimens only), is one component of a comprehensive MRSA colonization surveillance program. It is not intended to diagnose MRSA infection nor to guide or monitor treatment for MRSA infections.      Time coordinating discharge: Over 30 minutes  SIGNED:   Louellen Molder, MD  [facility] 06/12/2016, 11:41 AM Pager   If 7PM-7AM, please contact night-coverage [email or web address] Password [email or web address]

## 2016-06-12 NOTE — Care Management Note (Addendum)
Case Management Note  Patient Details  Name: Arlina Robestta S Behanna MRN: [id number] Date of Birth: 09/03/1950  Subjective/Objective:      PE            Action/Plan: Discharge Planning: AVS reviewed:  [facility] spoke to pt and [facility] Pt states  she [facility] the past. [facility] Pt requesting Rollator, and cane for home. Contacted [facility] DME rep for DME. Pt states dtr at home to assist with care. NCM, Jeannie to provided pt with Eliquis 30 day free trial card.   PCP Lucky CowboyMCKEOWN, WILLIAM MD   Expected Discharge Date:  06/12/2016           Expected Discharge Plan:  Home w Home Health Services  In-House Referral:  NA  Discharge planning Services  CM Consult  Post Acute Care Choice:  Home Health Choice offered to:  Patient  DME Arranged:  3-N-1, [place] DME Agency:  [facility]  [facility]  Status of Service:  Completed, signed off  If discussed at Long Length of Stay Meetings, dates discussed:    Additional Comments:  Elliot CousinShavis, Karrington Mccravy Ellen, RN 06/12/2016, 2:16 PM

## 2016-06-13 ENCOUNTER — Telehealth: Payer: Self-pay | Admitting: *Deleted

## 2016-06-13 ENCOUNTER — Other Ambulatory Visit: Payer: Self-pay | Admitting: Internal Medicine

## 2016-06-13 ENCOUNTER — Ambulatory Visit: Payer: Self-pay | Admitting: Internal Medicine

## 2016-06-13 DIAGNOSIS — I10 Essential (primary) hypertension: Secondary | ICD-10-CM

## 2016-06-13 NOTE — Telephone Encounter (Signed)
Admit date - 06/08/2016 Discharge date - 06/12/2016  Diagnosis: Recurrent/submassive PE  Patient declines having any questions or concerns about medications.  States she was placed back on Eliquis and "is aware of how to take Rx."  The only concern she has is "how she is going to get food."  She states she called and left message with social services to see if they can offer any additional services and help.  She states "she does not have anyone who can help her" and [facility] can not get to patient's house until tomorrow or Wednesday.  She also states that she "sent her granddaughter to get her food and she has not returned to the house yet."  I advised her to try to call her granddaughter again to see when she plans to return to the house.  I advised the patient to call if any further questions or concerns arise about he hospital admission or medications she was discharged with.  Patient will f/u here 06/15/16 for hospital f/u appt.

## 2016-06-14 DIAGNOSIS — Z86718 Personal history of other venous thrombosis and embolism: Secondary | ICD-10-CM | POA: Diagnosis not present

## 2016-06-14 DIAGNOSIS — M1711 Unilateral primary osteoarthritis, right knee: Secondary | ICD-10-CM | POA: Diagnosis not present

## 2016-06-14 DIAGNOSIS — I5032 Chronic diastolic (congestive) heart failure: Secondary | ICD-10-CM | POA: Diagnosis not present

## 2016-06-14 DIAGNOSIS — I2699 Other pulmonary embolism without acute cor pulmonale: Secondary | ICD-10-CM | POA: Diagnosis not present

## 2016-06-14 DIAGNOSIS — G4733 Obstructive sleep apnea (adult) (pediatric): Secondary | ICD-10-CM | POA: Diagnosis not present

## 2016-06-14 DIAGNOSIS — E785 Hyperlipidemia, unspecified: Secondary | ICD-10-CM | POA: Diagnosis not present

## 2016-06-14 DIAGNOSIS — Z86711 Personal history of pulmonary embolism: Secondary | ICD-10-CM | POA: Diagnosis not present

## 2016-06-14 DIAGNOSIS — I110 Hypertensive heart disease with heart failure: Secondary | ICD-10-CM | POA: Diagnosis not present

## 2016-06-15 ENCOUNTER — Ambulatory Visit (INDEPENDENT_AMBULATORY_CARE_PROVIDER_SITE_OTHER): Payer: Medicare Other | Admitting: Physician Assistant

## 2016-06-15 ENCOUNTER — Encounter: Payer: Self-pay | Admitting: Physician Assistant

## 2016-06-15 ENCOUNTER — Ambulatory Visit: Payer: Self-pay | Admitting: Internal Medicine

## 2016-06-15 VITALS — BP 140/80 | HR 115 | Temp 98.4°F | Resp 18 | Ht 62.0 in | Wt 344.0 lb

## 2016-06-15 DIAGNOSIS — N179 Acute kidney failure, unspecified: Secondary | ICD-10-CM | POA: Diagnosis not present

## 2016-06-15 DIAGNOSIS — R002 Palpitations: Secondary | ICD-10-CM | POA: Diagnosis not present

## 2016-06-15 DIAGNOSIS — R05 Cough: Secondary | ICD-10-CM

## 2016-06-15 DIAGNOSIS — J9601 Acute respiratory failure with hypoxia: Secondary | ICD-10-CM

## 2016-06-15 DIAGNOSIS — I2699 Other pulmonary embolism without acute cor pulmonale: Secondary | ICD-10-CM | POA: Diagnosis not present

## 2016-06-15 DIAGNOSIS — R059 Cough, unspecified: Secondary | ICD-10-CM

## 2016-06-15 LAB — CBC WITH DIFFERENTIAL/PLATELET
BASOS ABS: 0 {cells}/uL (ref 0–200)
BASOS PCT: 0 %
EOS ABS: 102 {cells}/uL (ref 15–500)
EOS PCT: 1 %
HCT: 40.5 % (ref 35.0–45.0)
HEMOGLOBIN: 12.9 g/dL (ref 11.7–15.5)
LYMPHS ABS: 816 {cells}/uL — AB (ref 850–3900)
Lymphocytes Relative: 8 %
MCH: 28.2 pg (ref 27.0–33.0)
MCHC: 31.9 g/dL — ABNORMAL LOW (ref 32.0–36.0)
MCV: 88.4 fL (ref 80.0–100.0)
MPV: 10.1 fL (ref 7.5–12.5)
Monocytes Absolute: 1122 cells/uL — ABNORMAL HIGH (ref 200–950)
Monocytes Relative: 11 %
NEUTROS ABS: 8160 {cells}/uL — AB (ref 1500–7800)
Neutrophils Relative %: 80 %
Platelets: 329 10*3/uL (ref 140–400)
RBC: 4.58 MIL/uL (ref 3.80–5.10)
RDW: 12.9 % (ref 11.0–15.0)
WBC: 10.2 10*3/uL (ref 3.8–10.8)

## 2016-06-15 MED ORDER — BENZONATATE 100 MG PO CAPS: 200.0000 mg | ORAL_CAPSULE | Freq: Three times a day (TID) | ORAL | 0 refills | Status: DC | PRN

## 2016-06-15 MED ORDER — AZITHROMYCIN 250 MG PO TABS: ORAL_TABLET | ORAL | 1 refills | Status: DC

## 2016-06-15 NOTE — Progress Notes (Signed)
Hospital follow up  Assessment and Plan: [id number]  hospital visit follow up for  Recurrent/submassive pulmonary emboli - continue eliquis- no CP, no acute SOB at this time AKI- check labs, patient is back on lasix and losartan- recheck today SOB- with cough/chills, will put on zpak, need CXR tomorrow if continuing cough, check for anemia/dehydration- starting pulmonary rehab outpatient Palpitations/sinus tachy- if anemia/dehydration- check labs- may  Benefit from  low dose verapamil/BB High risk readmission due to SOB, body habitus, trouble with ADL's/food at home- talked with daughter will have someone with the patient daily however I told the patient if any SOB or feels that she is alone/staying alone/feels endangered at all she needs to call 911  Hospital discharge meds were reviewed, and reconciled with the patient.    Over 40 minutes of exam, counseling, chart review, and complex, high/moderate level critical decision making was performed this visit.   HPI 65 y.o.female presents for follow up for transition from recent hospitalization. Admit date to the hospital was 06/08/16, patient was discharged from the hospital on 06/12/16 and our nursing staff contacted the patient the day after discharge to set up a follow up appointment, patient was admitted for:  Recurrent/submassive pulmonary emboli.   Patient had acute SOB on 12/13, went to ER and found to have submassive PE with right heart strain with elevated troponin and AKI. Has IVC in place, DVT negative will need lifelong anticoagulation, started on elliquis. Troponin was thought to be due to demand ischemia, she had a normal echo and EKG, no CP since discharge. Lasix was held in the hospital due to AKI but she was restarted on ARB in the AM and lasix BID at discharge. She continues to have severe shortness of breath, dry cough, some chills since discharge, she states with any exertion she is SOB and has palpitations. She has had some weight  gain, leg swelling, she is on the fluid pill twice daily,  she is taking elliquis 10 mg in the AM and 2 at night until Sunday she will go on 5 mg twice daily. She is not driving, she is having severe shortness of breath with ADL's, unable to walk, hard time cooking food, washing. She has not been using CPAP for the last 2 nights due to cough, she was not discharged with oxygen. Uncertain if she can having help at home.  Talk with her daughter on the phone, she will having someone out at the house morning and night, explained that home health can not help with ADL's, will try to contact social work.  She has been constipated since discharge.   While in the office with exertion never had desaturation below 91% but due to body habitus unable to walk/go far.     Lab Results  Component Value Date   WBC 7.0 06/12/2016   HGB 11.1 (L) 06/12/2016   HCT 34.6 (L) 06/12/2016   MCV 89.2 06/12/2016   PLT 224 06/12/2016   Lab Results  Component Value Date   CREATININE 1.09 (H) 06/10/2016   BUN 32 (H) 06/10/2016   NA 141 06/10/2016   K 4.6 06/10/2016   CL 111 06/10/2016   CO2 25 06/10/2016   BMI is Body mass index is 62.92 kg/m.. Wt Readings from Last 3 Encounters:  06/15/16 (!) 344 lb (156 kg)  06/12/16 (!) 343 lb 7.6 oz (155.8 kg)  05/05/16 (!) 324 lb (147 kg)    Images while in the hospital: Dg Chest 2 View  Result Date:  06/08/2016 CLINICAL DATA:  65 y/o  F; shortness of breath. EXAM: CHEST  2 VIEW COMPARISON:  04/19/2016 chest radiograph FINDINGS: Low lung volumes. Stable cardiomegaly given projection and technique. No focal consolidation. No pleural effusion. Multilevel degenerative changes of thoracic spine. IVC filter. Upper abdominal surgical clips, presumably cholecystectomy. IMPRESSION: Stable cardiomegaly. Low lung volumes. No acute pulmonary process identified. Electronically Signed   By: Mitzi HansenLance  Furusawa-Stratton M.D.   On: 06/08/2016 02:46   Ct Angio Chest Pe W And/or Wo  Contrast  Result Date: 06/08/2016 CLINICAL DATA:  65 y/o F; dyspnea and hypoxemia with history of pulmonary embolus. EXAM: CT ANGIOGRAPHY CHEST WITH CONTRAST TECHNIQUE: Multidetector CT imaging of the chest was performed using the standard protocol during bolus administration of intravenous contrast. Multiplanar CT image reconstructions and MIPs were obtained to evaluate the vascular anatomy. CONTRAST:  80 cc Isovue 370 COMPARISON:  CT angiogram of the chest dated 11/21/2012. FINDINGS: Cardiovascular: Satisfactory opacification of the pulmonary arteries to the segmental level. Saddle pulmonary embolus extending into multiple segmental pulmonary arteries. RV/LV equals 1.2. Moderate cardiomegaly. No pericardial effusion. Normal caliber thoracic aorta. Mild aortic atherosclerosis with arch calcifications. Enlarged main pulmonary artery measuring 37 mm indicating pulmonary artery hypertension. Mediastinum/Nodes: No enlarged mediastinal, hilar, or axillary lymph nodes. Thyroid gland, trachea, and esophagus demonstrate no significant findings. Lungs/Pleura: Stable 3 mm right middle lobe nodule (series 7, image 42), no additional recommendations for follow-up. No consolidation of the lungs. No pleural effusion. Upper Abdomen: No acute abnormality. Musculoskeletal: No chest wall abnormality. No acute or significant osseous findings. Review of the MIP images confirms the above findings. IMPRESSION: Positive for acute PE with saddle pulmonary embolus and CTevidence of right heart strain (RV/LV Ratio = 1.2) consistent with at least submassive (intermediate risk) PE. The presence of right heart strain has been associated with an increased risk of morbidity and mortality. These results were called by telephone at the time of interpretation on 06/08/2016 at 4:19 am to Dr. Alona BeneJOSHUA LONG , who verbally acknowledged these results. Electronically Signed   By: Mitzi HansenLance  Furusawa-Stratton M.D.   On: 06/08/2016 04:21    Past Medical  History:  Diagnosis Date  . Acute meniscal tear of knee    RIGHT  . Arthritis    "right knee" (11/21/2012)  . Elevated hemoglobin A1c   . Exertional shortness of breath    "this week" (11/21/2012)  . History of DVT (deep vein thrombosis)    IN PREGNANCY  . HTN (hypertension)    CARDIOLOGIST--  DR HOCHREIN-- CLEARANCE NOTE IN EPIC AND CHART FROM 08-21-2012  . Mixed hyperlipidemia   . Obesity   . Pulmonary embolism (HCC)    "3 today" (11/21/2012)  . RBBB (right bundle branch block with left anterior fascicular block)   . Seasonal allergies   . Sinus tachycardia   . Sleep apnea   . Vitamin D deficiency      Allergies  Allergen Reactions  . Codeine Other (See Comments)    Unknown  . Hydrocodone     Itch  . Oxycodone Rash  . Tramadol Rash      Current Outpatient Prescriptions on File Prior to Visit  Medication Sig Dispense Refill  . acetaminophen (TYLENOL) 500 MG tablet Take 500 mg by mouth every 6 (six) hours as needed for pain.    Marland Kitchen. amLODipine (NORVASC) 5 MG tablet Take 1/2 to 1 tablet daily as directed for BP 90 tablet 1  . apixaban (ELIQUIS) 5 MG TABS tablet Take 10 mg (  2 tablets)  2 times daily for 7 days, then 5mg  (1 tablet) 2 times daily 74 tablet 0  . aspirin EC 81 MG tablet Take 1 tablet (81 mg total) by mouth daily. 1 tablet 0  . atorvastatin (LIPITOR) 80 MG tablet Take 1/2 to 1 tablet daily or as directed for Cholesterol 30 tablet 11  . Cholecalciferol (VITAMIN D3) 5000 UNITS CAPS Take 1 capsule by mouth daily.    . furosemide (LASIX) 40 MG tablet Take 1 tablet 2 x/ day for BP & Fluid 180 tablet 1  . losartan (COZAAR) 100 MG tablet Take 1 tablet daily for BP 90 tablet 1  . Magnesium Oxide 250 MG TABS Take 1 tablet by mouth daily.     . metoprolol (LOPRESSOR) 50 MG tablet TAKE ONE TABLET BY MOUTH TWICE DAILY 180 tablet 1  . minoxidil (LONITEN) 10 MG tablet Take 1/2 to 1 tablet daily as directed for BP 90 tablet 1  . PROAIR HFA 108 (90 Base) MCG/ACT inhaler Inhale  1-2 puffs into the lungs every 4 (four) hours as needed for shortness of breath.     . mometasone (NASONEX) 50 MCG/ACT nasal spray Place 2 sprays into the nose daily. 17 g 2   No current facility-administered medications on file prior to visit.     ROS: all negative except above.   Physical Exam: Filed Weights   06/15/16 1622  Weight: (!) 344 lb (156 kg)   BP 140/80   Pulse (!) 115   Temp 98.4 F (36.9 C)   Resp 18   Ht 5\' 2"  (1.575 m)   Wt (!) 344 lb (156 kg)   SpO2 95%   BMI 62.92 kg/m  General Appearance: Well nourished, in no apparent distress. Eyes: PERRLA, EOMs, conjunctiva no swelling or erythema Sinuses: No Frontal/maxillary tenderness ENT/Mouth: Ext aud canals clear, TMs without erythema, bulging. No erythema, swelling, or exudate on post pharynx.  Tonsils not swollen or erythematous. Hearing normal.  Neck: Supple, thyroid normal.  Respiratory: Respiratory effort normal, with decreased diffuse breath sounds diffusely, no rales/rhonchi. .   Cardio: RRR with no MRGs. Brisk peripheral pulses with trace edema.  Abdomen: Soft, + BS, morbidly obese Non tender, no guarding, rebound, hernias, masses. Lymphatics: Non tender without lymphadenopathy.  Musculoskeletal: Full ROM, 5/5 strength,in wheel chair due to SOB/weakness Skin: Warm, dry without rashes, lesions, ecchymosis.  Neuro: Cranial nerves intact. Normal muscle tone, no cerebellar symptoms.  Psych: Awake and oriented X 3, normal affect, Insight and Judgment appropriate.     Quentin Mulling, PA-C 4:42 PM [facility]

## 2016-06-15 NOTE — Patient Instructions (Addendum)
Take tessalon Take zpak Will check labs, if negative may need chest xray or may send in medication for your heart rate.   Go to the Er if you have any issues, not getting the care you need  Get on miralax for your constipation   Pulmonary Embolism A pulmonary embolism (PE) is a sudden blockage or decrease of blood flow in one lung or both lungs. Most blockages come from a blood clot that travels from the legs or the pelvis to the lungs. PE is a dangerous and potentially life-threatening condition if it is not treated right away. What are the causes? A pulmonary embolism occurs most commonly when a blood clot travels from one of your veins to your lungs. Rarely, PE is caused by air, fat, amniotic fluid, or part of a tumor traveling through your veins to your lungs. What increases the risk? A PE is more likely to develop in:  People who smoke.  People who areolder, especially over 80 years of age.  People who are overweight (obese).  People who sit or lie still for a long time, such as during long-distance travel (over 4 hours), bed rest, hospitalization, or during recovery from certain medical conditions like a stroke.  People who do not engage in much physical activity (sedentary lifestyle).  People who have chronic breathing disorders.  People whohave a personal or family history of blood clots or blood clotting disease.  People whohave peripheral vascular disease (PVD), diabetes, or some types of cancer.  People who haveheart disease, especially if the person had a recent heart attack or has congestive heart failure.  People who have neurological diseases that affect the legs (leg paresis).  People who have had a traumatic injury, such as breaking a hip or leg.  People whohave recently had major or lengthy surgery, especially on the hip, knee, or abdomen.  People who have hada central line placed inside a large vein.  People who takemedicines that contain the  hormone estrogen. These include birth control pills and hormone replacement therapy.  Pregnancy or during childbirth or the postpartum period. What are the signs or symptoms? The symptoms of a PE usually start suddenly and include:  Shortness of breath while active or at rest.  Coughing or coughing up blood or blood-tinged mucus.  Chest pain that is often worse with deep breaths.  Rapid or irregular heartbeat.  Feeling light-headed or dizzy.  Fainting.  Feelinganxious.  Sweating. There may also be pain and swelling in a leg if that is where the blood clot started. These symptoms may represent a serious problem that is an emergency. Do not wait to see if the symptoms will go away. Get medical help right away. Call your local emergency services (911 in the [place] Do not drive yourself to the hospital.  How is this diagnosed? Your health care provider will take a medical history and perform a physical exam. You may also have other tests, including:  Blood tests to assess the clotting properties of your blood, assess oxygen levels in your blood, and find blood clots.  Imaging tests, such as CT, ultrasound, MRI, X-ray, and other tests to see if you have clots anywhere in your body.  An electrocardiogram (ECG) to look for heart strain from blood clots in the lungs. How is this treated? The main goals of PE treatment are:  To stop a blood clot from growing larger.  To stop new blood clots from forming. The type of treatment that you receive depends  on many factors, such as the cause of your PE, your risk for bleeding or developing more clots, and other medical conditions that you have. Sometimes, a combination of treatments is necessary. This condition may be treated with:  Medicines, including newer oral blood thinners (anticoagulants), warfarin, low molecular weight heparins, thrombolytics, or heparins.  Wearing compression stockings or using different types of  devices.  Surgery (rare) to remove the blood clot or to place a filter in your abdomen to stop the blood clot from traveling to your lungs. Treatments for a PE are often divided into immediate treatment, long-term treatment (up to 3 months after PE), and extended treatment (more than 3 months after PE). Your treatment may continue for several months. This is called maintenance therapy, and it is used to prevent the forming of new blood clots. You can work with your health care provider to choose the treatment program that is best for you. What are anticoagulants?  Anticoagulants are medicines that treat PEs. They can stop current blood clots from growing and stop new clots from forming. They cannot dissolve existing clots. Your body dissolves clots by itself over time. Anticoagulants are given by mouth, by injection, or through an IV tube. What are thrombolytics?  Thrombolytics are clot-dissolving medicines that are used to dissolve a PE. They carry a high risk of bleeding, so they tend to be used only in severe cases or if you have very low blood pressure. Follow these instructions at home: If you are taking a newer oral anticoagulant:  Take the medicine every single day at the same time each day.  Understand what foods and drugs interact with this medicine.  Understand that there are no regular blood tests required when using this medicine.  Understandthe side effects of this medicine, including excessive bruising or bleeding. Ask your health care provider or pharmacist about other possible side effects. If you are taking warfarin:  Understand how to take warfarin and know which foods can affect how warfarin works in Public relations account executive.  Understand that it is dangerous to taketoo much or too little warfarin. Too much warfarin increases the risk of bleeding. Too little warfarin continues to allow the risk for blood clots.  Follow your PT and INR blood testing schedule. The PT and INR results allow  your health care provider to adjust your dose of warfarin. It is very important that you have your PT and INR tested as often as told by your health care provider.  Avoid major changes in your diet, or tell your health care provider before you change your diet. Arrange a visit with a registered dietitian to answer your questions. Many foods, especially foods that are high in vitamin K, can interfere with warfarin and affect the PT and INR results. Eat a consistent amount of foods that are high in vitamin K, such as:  Spinach, kale, broccoli, cabbage, collard greens, turnip greens, Brussels sprouts, peas, cauliflower, seaweed, and parsley.  Beef liver and pork liver.  Green tea.  Soybean oil.  Tell your health care provider about any and all medicines, vitamins, and supplements that you take, including aspirin and other over-the-counter anti-inflammatory medicines. Be especially cautious with aspirin and anti-inflammatory medicines. Do not take those before you ask your health care provider if it is safe to do so. This is important because many medicines can interfere with warfarin and affect the PT and INR results.  Do not start or stop taking any over-the-counter or prescription medicine unless your health care  provider or pharmacist tells you to do so. If you take warfarin, you will also need to do these things:  Hold pressure over cuts for longer than usual.  Tell your dentist and other health care providers that you are taking warfarin before you have any procedures in which bleeding may occur.  Avoid alcohol or drink very small amounts. Tell your health care provider if you change your alcohol intake.  Do not use tobacco products, including cigarettes, chewing tobacco, and e-cigarettes. If you need help quitting, ask your health care provider.  Avoid contact sports. General instructions  Take over-the-counter and prescription medicines only as told by your health care provider.  Anticoagulant medicines can have side effects, including easy bruising and difficulty stopping bleeding. If you are prescribed an anticoagulant, you will also need to do these things:  Hold pressure over cuts for longer than usual.  Tell your dentist and other health care providers that you are taking anticoagulants before you have any procedures in which bleeding may occur.  Avoid contact sports.  Wear a medical alert bracelet or carry a medical alert card that says you have had a PE.  Ask your health care provider how soon you can go back to your normal activities. Stay active to prevent new blood clots from forming.  Make sure to exercise while traveling or when you have been sitting or standing for a long period of time. It is very important to exercise. Exercise your legs by walking or by tightening and relaxing your leg muscles often. Take frequent walks.  Wear compression stockings as told by your health care provider to help prevent more blood clots from forming.  Do not use tobacco products, including cigarettes, chewing tobacco, and e-cigarettes. If you need help quitting, ask your health care provider.  Keep all follow-up appointments with your health care provider. This is important. How is this prevented? Take these actions to decrease your risk of developing another PE:  Exercise regularly. For at least 30 minutes every day, engage in:  Activity that involves moving your arms and legs.  Activity that encourages good blood flow through your body by increasing your heart rate.  Exercise your arms and legs every hour during long-distance travel (over 4 hours). Drink plenty of water and avoid drinking alcohol while traveling.  Avoid sitting or lying in bed for long periods of time without moving your legs.  Maintain a weight that is appropriate for your height. Ask your health care provider what weight is healthy for you.  If you are a woman who is over 63 years of age,  avoid unnecessary use of medicines that contain estrogen. These include birth control pills.  Do not smoke, especially if you take estrogen medicines. If you need help quitting, ask your health care provider.  If you are at very high risk for PE, wear compression stockings.  If you recently had a PE, have regularly scheduled ultrasound testing on your legs to check for new blood clots. If you are hospitalized, prevention measures may include:  Early walking after surgery, as soon as your health care provider says that it is safe.  Receiving anticoagulants to prevent blood clots. If you cannot take anticoagulants, other options may be available, such as wearing compression stockings or using different types of devices. Get help right away if:  You have new or increased pain, swelling, or redness in an arm or leg.  You have numbness or tingling in an arm or leg.  You  have shortness of breath while active or at rest.  You have chest pain.  You have a rapid or irregular heartbeat.  You feel light-headed or dizzy.  You cough up blood.  You notice blood in your vomit, bowel movement, or urine.  You have a fever. These symptoms may represent a serious problem that is an emergency. Do not wait to see if the symptoms will go away. Get medical help right away. Call your local emergency services (911 in the [place] Do not drive yourself to the hospital.  This information is not intended to replace advice given to you by your health care provider. Make sure you discuss any questions you have with your health care provider. Document Released: 06/10/2000 Document Revised: 11/19/2015 Document Reviewed: 10/08/2014 Elsevier Interactive Patient Education  2017 [employer].

## 2016-06-16 ENCOUNTER — Inpatient Hospital Stay (HOSPITAL_COMMUNITY): Payer: Medicare Other

## 2016-06-16 ENCOUNTER — Inpatient Hospital Stay (HOSPITAL_COMMUNITY)
Admission: EM | Admit: 2016-06-16 | Discharge: 2016-06-19 | DRG: 871 | Disposition: A | Discharge status: Home / Self Care | Payer: Medicare Other | Attending: Internal Medicine | Admitting: Internal Medicine

## 2016-06-16 ENCOUNTER — Emergency Department (HOSPITAL_COMMUNITY): Payer: Medicare Other

## 2016-06-16 ENCOUNTER — Telehealth: Payer: Self-pay | Admitting: Internal Medicine

## 2016-06-16 ENCOUNTER — Encounter (HOSPITAL_COMMUNITY): Payer: Self-pay | Admitting: Emergency Medicine

## 2016-06-16 ENCOUNTER — Telehealth (HOSPITAL_COMMUNITY): Payer: Self-pay | Admitting: Internal Medicine

## 2016-06-16 ENCOUNTER — Telehealth: Payer: Self-pay

## 2016-06-16 DIAGNOSIS — I110 Hypertensive heart disease with heart failure: Secondary | ICD-10-CM | POA: Diagnosis present

## 2016-06-16 DIAGNOSIS — Z885 Allergy status to narcotic agent status: Secondary | ICD-10-CM

## 2016-06-16 DIAGNOSIS — Z90710 Acquired absence of both cervix and uterus: Secondary | ICD-10-CM

## 2016-06-16 DIAGNOSIS — Z79899 Other long term (current) drug therapy: Secondary | ICD-10-CM | POA: Diagnosis not present

## 2016-06-16 DIAGNOSIS — N3000 Acute cystitis without hematuria: Secondary | ICD-10-CM | POA: Diagnosis not present

## 2016-06-16 DIAGNOSIS — Z9989 Dependence on other enabling machines and devices: Secondary | ICD-10-CM

## 2016-06-16 DIAGNOSIS — J9601 Acute respiratory failure with hypoxia: Secondary | ICD-10-CM | POA: Diagnosis not present

## 2016-06-16 DIAGNOSIS — Z8249 Family history of ischemic heart disease and other diseases of the circulatory system: Secondary | ICD-10-CM | POA: Diagnosis not present

## 2016-06-16 DIAGNOSIS — Z90722 Acquired absence of ovaries, bilateral: Secondary | ICD-10-CM

## 2016-06-16 DIAGNOSIS — R000 Tachycardia, unspecified: Secondary | ICD-10-CM

## 2016-06-16 DIAGNOSIS — Z86718 Personal history of other venous thrombosis and embolism: Secondary | ICD-10-CM | POA: Diagnosis not present

## 2016-06-16 DIAGNOSIS — E782 Mixed hyperlipidemia: Secondary | ICD-10-CM | POA: Diagnosis present

## 2016-06-16 DIAGNOSIS — R778 Other specified abnormalities of plasma proteins: Secondary | ICD-10-CM | POA: Diagnosis present

## 2016-06-16 DIAGNOSIS — Z7901 Long term (current) use of anticoagulants: Secondary | ICD-10-CM | POA: Diagnosis not present

## 2016-06-16 DIAGNOSIS — R531 Weakness: Secondary | ICD-10-CM | POA: Diagnosis present

## 2016-06-16 DIAGNOSIS — I10 Essential (primary) hypertension: Secondary | ICD-10-CM | POA: Diagnosis present

## 2016-06-16 DIAGNOSIS — G4733 Obstructive sleep apnea (adult) (pediatric): Secondary | ICD-10-CM

## 2016-06-16 DIAGNOSIS — Z86711 Personal history of pulmonary embolism: Secondary | ICD-10-CM | POA: Diagnosis not present

## 2016-06-16 DIAGNOSIS — E119 Type 2 diabetes mellitus without complications: Secondary | ICD-10-CM | POA: Diagnosis present

## 2016-06-16 DIAGNOSIS — R0602 Shortness of breath: Secondary | ICD-10-CM

## 2016-06-16 DIAGNOSIS — Z7982 Long term (current) use of aspirin: Secondary | ICD-10-CM | POA: Diagnosis not present

## 2016-06-16 DIAGNOSIS — Z6841 Body Mass Index (BMI) 40.0 and over, adult: Secondary | ICD-10-CM

## 2016-06-16 DIAGNOSIS — Z9079 Acquired absence of other genital organ(s): Secondary | ICD-10-CM

## 2016-06-16 DIAGNOSIS — I2699 Other pulmonary embolism without acute cor pulmonale: Secondary | ICD-10-CM

## 2016-06-16 DIAGNOSIS — Z888 Allergy status to other drugs, medicaments and biological substances status: Secondary | ICD-10-CM | POA: Diagnosis not present

## 2016-06-16 DIAGNOSIS — R6510 Systemic inflammatory response syndrome (SIRS) of non-infectious origin without acute organ dysfunction: Secondary | ICD-10-CM | POA: Diagnosis not present

## 2016-06-16 DIAGNOSIS — Z95828 Presence of other vascular implants and grafts: Secondary | ICD-10-CM

## 2016-06-16 DIAGNOSIS — E785 Hyperlipidemia, unspecified: Secondary | ICD-10-CM | POA: Diagnosis not present

## 2016-06-16 DIAGNOSIS — R05 Cough: Secondary | ICD-10-CM | POA: Diagnosis not present

## 2016-06-16 DIAGNOSIS — B9620 Unspecified Escherichia coli [E. coli] as the cause of diseases classified elsewhere: Secondary | ICD-10-CM | POA: Diagnosis present

## 2016-06-16 DIAGNOSIS — R197 Diarrhea, unspecified: Secondary | ICD-10-CM | POA: Diagnosis not present

## 2016-06-16 DIAGNOSIS — I5032 Chronic diastolic (congestive) heart failure: Secondary | ICD-10-CM | POA: Diagnosis present

## 2016-06-16 DIAGNOSIS — A419 Sepsis, unspecified organism: Secondary | ICD-10-CM | POA: Diagnosis not present

## 2016-06-16 DIAGNOSIS — M1711 Unilateral primary osteoarthritis, right knee: Secondary | ICD-10-CM | POA: Diagnosis not present

## 2016-06-16 DIAGNOSIS — R7989 Other specified abnormal findings of blood chemistry: Secondary | ICD-10-CM | POA: Diagnosis present

## 2016-06-16 LAB — HEPATIC FUNCTION PANEL
ALK PHOS: 88 U/L (ref 33–130)
ALT: 42 U/L — AB (ref 6–29)
AST: 22 U/L (ref 10–35)
Albumin: 3.7 g/dL (ref 3.6–5.1)
BILIRUBIN TOTAL: 0.7 mg/dL (ref 0.2–1.2)
Bilirubin, Direct: 0.2 mg/dL (ref <=0.2)
Indirect Bilirubin: 0.5 mg/dL (ref 0.2–1.2)
Total Protein: 6.7 g/dL (ref 6.1–8.1)

## 2016-06-16 LAB — BASIC METABOLIC PANEL WITH GFR
BUN: 15 mg/dL (ref 7–25)
CHLORIDE: 104 mmol/L (ref 98–110)
CO2: 27 mmol/L (ref 20–31)
CREATININE: 0.98 mg/dL (ref 0.50–0.99)
Calcium: 9.1 mg/dL (ref 8.6–10.4)
GFR, Est African American: 71 mL/min (ref >=60)
GFR, Est Non African American: 62 mL/min (ref >=60)
Glucose, Bld: 137 mg/dL — ABNORMAL HIGH (ref 65–99)
POTASSIUM: 4.4 mmol/L (ref 3.5–5.3)
SODIUM: 141 mmol/L (ref 135–146)

## 2016-06-16 LAB — COMPREHENSIVE METABOLIC PANEL
ALBUMIN: 3.6 g/dL (ref 3.5–5.0)
ALT: 44 U/L (ref 14–54)
ANION GAP: 9 (ref 5–15)
AST: 33 U/L (ref 15–41)
Alkaline Phosphatase: 82 U/L (ref 38–126)
BILIRUBIN TOTAL: 1.7 mg/dL — AB (ref 0.3–1.2)
BUN: 17 mg/dL (ref 6–20)
CO2: 26 mmol/L (ref 22–32)
Calcium: 9 mg/dL (ref 8.9–10.3)
Chloride: 104 mmol/L (ref 101–111)
Creatinine, Ser: 1.18 mg/dL — ABNORMAL HIGH (ref 0.44–1.00)
GFR calc Af Amer: 56 mL/min — ABNORMAL LOW (ref >60)
GFR calc non Af Amer: 48 mL/min — ABNORMAL LOW (ref >60)
GLUCOSE: 154 mg/dL — AB (ref 65–99)
POTASSIUM: 4.2 mmol/L (ref 3.5–5.1)
SODIUM: 139 mmol/L (ref 135–145)
TOTAL PROTEIN: 7.1 g/dL (ref 6.5–8.1)

## 2016-06-16 LAB — CBC WITH DIFFERENTIAL/PLATELET
BASOS ABS: 0 10*3/uL (ref 0.0–0.1)
Basophils Relative: 0 %
Eosinophils Absolute: 0.1 10*3/uL (ref 0.0–0.7)
Eosinophils Relative: 1 %
HEMATOCRIT: 38.7 % (ref 36.0–46.0)
Hemoglobin: 12.5 g/dL (ref 12.0–15.0)
LYMPHS ABS: 1 10*3/uL (ref 0.7–4.0)
LYMPHS PCT: 9 %
MCH: 28.8 pg (ref 26.0–34.0)
MCHC: 32.3 g/dL (ref 30.0–36.0)
MCV: 89.2 fL (ref 78.0–100.0)
Monocytes Absolute: 1.8 10*3/uL — ABNORMAL HIGH (ref 0.1–1.0)
Monocytes Relative: 16 %
NEUTROS ABS: 8.1 10*3/uL — AB (ref 1.7–7.7)
Neutrophils Relative %: 74 %
PLATELETS: 232 10*3/uL (ref 150–400)
RBC: 4.34 MIL/uL (ref 3.87–5.11)
RDW: 13.2 % (ref 11.5–15.5)
WBC: 11 10*3/uL — AB (ref 4.0–10.5)

## 2016-06-16 LAB — URINALYSIS, ROUTINE W REFLEX MICROSCOPIC
Bilirubin Urine: NEGATIVE
GLUCOSE, UA: NEGATIVE mg/dL
KETONES UR: NEGATIVE mg/dL
Nitrite: POSITIVE — AB
PH: 5 (ref 5.0–8.0)
Protein, ur: NEGATIVE mg/dL
Specific Gravity, Urine: 1.017 (ref 1.005–1.030)
Squamous Epithelial / LPF: NONE SEEN

## 2016-06-16 LAB — PROTIME-INR
INR: 1.62
INR: 1.51
PROTHROMBIN TIME: 18.4 s — AB (ref 11.4–15.2)
Prothrombin Time: 19.4 seconds — ABNORMAL HIGH (ref 11.4–15.2)

## 2016-06-16 LAB — I-STAT CG4 LACTIC ACID, ED
LACTIC ACID, VENOUS: 1.75 mmol/L (ref 0.5–1.9)
LACTIC ACID, VENOUS: 1.07 mmol/L (ref 0.5–1.9)

## 2016-06-16 LAB — I-STAT BETA HCG BLOOD, ED (MC, WL, AP ONLY): I-stat hCG, quantitative: 5.4 m[IU]/mL — ABNORMAL HIGH (ref <5)

## 2016-06-16 LAB — TROPONIN I: Troponin I: 0.05 ng/mL (ref <0.03)

## 2016-06-16 LAB — APTT: aPTT: 47 seconds — ABNORMAL HIGH (ref 24–36)

## 2016-06-16 LAB — BRAIN NATRIURETIC PEPTIDE: B Natriuretic Peptide: 155 pg/mL — ABNORMAL HIGH (ref 0.0–100.0)

## 2016-06-16 MED ORDER — ALBUTEROL SULFATE (2.5 MG/3ML) 0.083% IN NEBU
5.0000 mg | INHALATION_SOLUTION | Freq: Once | RESPIRATORY_TRACT | Status: AC
  Administered 2016-06-16: 5 mg via RESPIRATORY_TRACT
  Filled 2016-06-16: qty 6

## 2016-06-16 MED ORDER — VITAMIN D 1000 UNITS PO TABS
5000.0000 [IU] | ORAL_TABLET | Freq: Every day | ORAL | Status: DC
  Administered 2016-06-17 – 2016-06-19 (×3): 5000 [IU] via ORAL
  Filled 2016-06-16 (×3): qty 5

## 2016-06-16 MED ORDER — IOPAMIDOL (ISOVUE-370) INJECTION 76%
INTRAVENOUS | Status: AC
  Filled 2016-06-16: qty 100

## 2016-06-16 MED ORDER — SODIUM CHLORIDE 0.9 % IV SOLN
INTRAVENOUS | Status: DC
  Administered 2016-06-17: 09:00:00 via INTRAVENOUS

## 2016-06-16 MED ORDER — METOPROLOL TARTRATE 50 MG PO TABS
50.0000 mg | ORAL_TABLET | Freq: Two times a day (BID) | ORAL | Status: DC
  Administered 2016-06-16 – 2016-06-19 (×6): 50 mg via ORAL
  Filled 2016-06-16 (×3): qty 1
  Filled 2016-06-16: qty 2
  Filled 2016-06-16: qty 1
  Filled 2016-06-16: qty 2

## 2016-06-16 MED ORDER — PIPERACILLIN-TAZOBACTAM 3.375 G IVPB
3.3750 g | Freq: Three times a day (TID) | INTRAVENOUS | Status: DC
  Administered 2016-06-17: 3.375 g via INTRAVENOUS
  Filled 2016-06-16: qty 50

## 2016-06-16 MED ORDER — LEVALBUTEROL HCL 0.63 MG/3ML IN NEBU: 0.6300 mg | INHALATION_SOLUTION | Freq: Four times a day (QID) | RESPIRATORY_TRACT | Status: DC | PRN

## 2016-06-16 MED ORDER — IOPAMIDOL (ISOVUE-370) INJECTION 76%
125.0000 mL | Freq: Once | INTRAVENOUS | Status: AC | PRN
  Administered 2016-06-16: 125 mL via INTRAVENOUS

## 2016-06-16 MED ORDER — VANCOMYCIN HCL IN DEXTROSE 1-5 GM/200ML-% IV SOLN
1000.0000 mg | Freq: Once | INTRAVENOUS | Status: AC
  Administered 2016-06-17: 1000 mg via INTRAVENOUS
  Filled 2016-06-16: qty 200

## 2016-06-16 MED ORDER — MAGNESIUM OXIDE 400 (241.3 MG) MG PO TABS: 400.0000 mg | ORAL_TABLET | Freq: Every day | ORAL | Status: DC | PRN

## 2016-06-16 MED ORDER — PIPERACILLIN-TAZOBACTAM 3.375 G IVPB 30 MIN
3.3750 g | Freq: Once | INTRAVENOUS | Status: AC
  Administered 2016-06-16: 3.375 g via INTRAVENOUS
  Filled 2016-06-16: qty 50

## 2016-06-16 MED ORDER — ASPIRIN EC 81 MG PO TBEC
81.0000 mg | DELAYED_RELEASE_TABLET | Freq: Every day | ORAL | Status: DC
  Administered 2016-06-16 – 2016-06-19 (×4): 81 mg via ORAL
  Filled 2016-06-16 (×4): qty 1

## 2016-06-16 MED ORDER — VANCOMYCIN HCL 10 G IV SOLR
1250.0000 mg | Freq: Two times a day (BID) | INTRAVENOUS | Status: DC
  Filled 2016-06-16: qty 1250

## 2016-06-16 MED ORDER — ACETAMINOPHEN 325 MG PO TABS
650.0000 mg | ORAL_TABLET | Freq: Four times a day (QID) | ORAL | Status: DC | PRN
  Administered 2016-06-16 – 2016-06-18 (×3): 650 mg via ORAL
  Filled 2016-06-16 (×3): qty 2

## 2016-06-16 MED ORDER — CEFTRIAXONE SODIUM 1 G IJ SOLR
1.0000 g | Freq: Once | INTRAMUSCULAR | Status: AC
  Administered 2016-06-16: 1 g via INTRAVENOUS
  Filled 2016-06-16: qty 10

## 2016-06-16 MED ORDER — ONDANSETRON HCL 4 MG/2ML IJ SOLN: 4.0000 mg | Freq: Four times a day (QID) | INTRAMUSCULAR | Status: DC | PRN

## 2016-06-16 MED ORDER — FLUTICASONE PROPIONATE 50 MCG/ACT NA SUSP
1.0000 | Freq: Every day | NASAL | Status: DC
  Administered 2016-06-17 – 2016-06-19 (×3): 1 via NASAL
  Filled 2016-06-16: qty 16

## 2016-06-16 MED ORDER — HEPARIN (PORCINE) IN NACL 100-0.45 UNIT/ML-% IJ SOLN
1700.0000 [IU]/h | INTRAMUSCULAR | Status: DC
  Administered 2016-06-16: 1700 [IU]/h via INTRAVENOUS
  Filled 2016-06-16 (×2): qty 250

## 2016-06-16 MED ORDER — CHLORHEXIDINE GLUCONATE 0.12 % MT SOLN
15.0000 mL | Freq: Two times a day (BID) | OROMUCOSAL | Status: DC
  Administered 2016-06-17 – 2016-06-19 (×5): 15 mL via OROMUCOSAL
  Filled 2016-06-16 (×5): qty 15

## 2016-06-16 MED ORDER — ONDANSETRON HCL 4 MG PO TABS: 4.0000 mg | ORAL_TABLET | Freq: Four times a day (QID) | ORAL | Status: DC | PRN

## 2016-06-16 MED ORDER — ACETAMINOPHEN 650 MG RE SUPP: 650.0000 mg | Freq: Four times a day (QID) | RECTAL | Status: DC | PRN

## 2016-06-16 MED ORDER — ORAL CARE MOUTH RINSE
15.0000 mL | Freq: Two times a day (BID) | OROMUCOSAL | Status: DC
  Administered 2016-06-17: 15 mL via OROMUCOSAL

## 2016-06-16 MED ORDER — VANCOMYCIN HCL 10 G IV SOLR
1500.0000 mg | Freq: Once | INTRAVENOUS | Status: AC
  Administered 2016-06-17: 1500 mg via INTRAVENOUS
  Filled 2016-06-16: qty 1500

## 2016-06-16 MED ORDER — ATORVASTATIN CALCIUM 40 MG PO TABS
40.0000 mg | ORAL_TABLET | Freq: Every evening | ORAL | Status: DC
  Administered 2016-06-16 – 2016-06-19 (×4): 40 mg via ORAL
  Filled 2016-06-16 (×4): qty 1

## 2016-06-16 NOTE — Telephone Encounter (Signed)
Friend Felicia called, Concerned about [name], checking on CPAP titration with [facility], and  patient requesting DME Toilet Chair through [facility]. [facility] coming out today at 3pm can we order a DME Toilet Chair for her? Can we send an order to [facility] to evaluate/retitrate CPAP machine?

## 2016-06-16 NOTE — Telephone Encounter (Signed)
Contrina with Dr. Rosalita LevanW. [place] office called wants a return per Dr. Oneta RackMcKeown wants pt admitted to our Pulmonary Rehab, sent msg to RN to return call. KJ

## 2016-06-16 NOTE — Progress Notes (Signed)
Pharmacy Antibiotic Note  Kerry Liu is a 65 y.o. female with a PMH of HTN, CHF, DM, history of DVT and PE status post IVC filter and recent diagnosis of recurrent PE currently on Eliquis status post hospitalization last week who presents with fevers, chills, cough, and SOB. Home health team concerned about pneumonia and started her on azithromycin yesterday but symptoms continued today. Pharmacy has been consulted for Vancomycin and Zosyn dosing for sepsis likely d/t pneumonia (Rocephin given x 1 in ED).  Plan:  Vancomycin 2500 mg IV now, then 1250 mg IV q12 hr; goal trough 15-20 mcg/mL  Measure vancomycin trough levels at steady state as indicated  Zosyn 3.375 g IV given once over 30 minutes, then every 8 hrs by 4-hr infusion Follow clinical course, renal function, culture results as available Follow for de-escalation of antibiotics and LOT   Height: 5\' 2"  (157.5 cm) Weight: (!) 320 lb (145.2 kg) IBW/kg (Calculated) : 50.1  Temp (24hrs), Avg:98.8 F (37.1 C), Min:98.6 F (37 C), Max:99 F (37.2 C)   Recent Labs Lab 06/10/16 0536 06/11/16 0520 06/12/16 0548 06/15/16 1702 06/16/16 1712 06/16/16 1716 06/16/16 2053  WBC 7.8 7.6 7.0 10.2 11.0*  --   --   CREATININE 1.09*  --   --  0.98 1.18*  --   --   LATICACIDVEN  --   --   --   --   --  1.75 1.07    Estimated Creatinine Clearance: 67.9 mL/min (by C-G formula based on SCr of 1.18 mg/dL (H)).    Allergies  Allergen Reactions  . Codeine Other (See Comments)    Unknown  . Hydrocodone     Itch  . Oxycodone Rash  . Tramadol Rash    Antimicrobials this admission: Rocephin x 1 in ED 12/21 Vancomycin 12/21 >>  Zosyn 12/21 >>   Dose adjustments this admission: ---  Microbiology results: 12/21 BCx: sent 12/21 UCx: sent   Thank you for allowing pharmacy to be a part of this patient's care.  Bernadene Personrew Kerry Liu, PharmD, BCPS Pager: [phone] 06/16/2016, 10:25 PM

## 2016-06-16 NOTE — ED Provider Notes (Signed)
[facility]-EMERGENCY DEPT Provider Note   CSN: [id number] Arrival date & time: 06/16/16  1609     History   Chief Complaint Chief Complaint  Patient presents with  . Shortness of Breath  . Fever  . Cough    HPI Kerry Liu is a 65 y.o. 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 Patient to come in by family who report that over the last few days since discharge, patient has had return of shortness of breath. Patient denies any chest pain or palpitations but does say that the home health nursing found her to be tachycardic, febrile, and short of breath. Patient says that she has not coughed up anything but the home health team was concerned about pneumonia. They started her on azithromycin yesterday but symptoms continued today on a recheck probably her to be sent to the ED for evaluation. Patient denies any nausea or vomiting, but does report some intermittent constipation and diarrhea over the last week. Patient denies any serious but does say that her urination has increased and is darker. Patient says she's also had increase in her lower extremity edema. On arrival, patient is tachycardic and to give neck. Patient is maintaining her oxygen saturation on room air.   The history is provided by the patient, a relative and medical records. No language interpreter was used.  Shortness of Breath  This is a recurrent problem. The problem occurs continuously.The current episode started more than 2 days ago. The problem has been gradually worsening. Associated symptoms include a fever, cough and leg swelling. Pertinent negatives include no headaches, no rhinorrhea, no neck pain, no hemoptysis, no wheezing, no chest pain, no vomiting, no abdominal  pain, no rash and no leg pain. She has tried nothing for the symptoms. The treatment provided no relief. Associated medical issues include PE and DVT.    Past Medical History:  Diagnosis Date  . Acute meniscal tear of knee    RIGHT  . Arthritis    "right knee" (11/21/2012)  . Elevated hemoglobin A1c   . Exertional shortness of breath    "this week" (11/21/2012)  . History of DVT (deep vein thrombosis)    IN PREGNANCY  . HTN (hypertension)    CARDIOLOGIST--  DR HOCHREIN-- CLEARANCE NOTE IN EPIC AND CHART FROM 08-21-2012  . Mixed hyperlipidemia   . Obesity   . Pulmonary embolism (HCC)    "3 today" (11/21/2012)  . RBBB (right bundle branch block with left anterior fascicular block)   . Seasonal allergies   . Sinus tachycardia   . Sleep apnea   . Vitamin D deficiency     Patient Active Problem List   Diagnosis Date Noted  . Acute respiratory failure with hypoxemia (HCC) 06/12/2016  . OSA on CPAP 06/08/2016  . AKI (acute kidney injury) (HCC) 06/08/2016  . PE (pulmonary thromboembolism) (HCC) 06/08/2016  . Chronic diastolic CHF (congestive heart failure) (HCC) 06/08/2016  . Elevated troponin 06/08/2016  . 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 Medication management 07/10/2015  . Mixed hyperlipidemia   . Seasonal allergies   . Prediabetes   . Morbid obesity (HCC)   . Vitamin D deficiency   . History of DVT (deep vein thrombosis)   . Pulmonary embolism (HCC)   .  Recurrent pulmonary emboli (HCC) 11/21/2012  . Osteoarthritis of right knee 10/05/2012  . Essential hypertension 03/25/2009    Past Surgical History:  Procedure Laterality Date  . IVC filter placed in May 2014 N/A   . KNEE ARTHROSCOPY Right 12-09-2002  . KNEE ARTHROSCOPY WITH MEDIAL MENISECTOMY Right 10/05/2012   Procedure: KNEE ARTHROSCOPY WITH MEDIAL MENISECTOMY;  Surgeon: Jacki Cones, MD;  Location: [facility] [place];  Service: Orthopedics;  Laterality: Right;  . TOTAL ABDOMINAL  HYSTERECTOMY W/ BILATERAL SALPINGOOPHORECTOMY  ~ 1986   W/ BILATERAL SALPINGOOPHORECTOMY  . TRANSTHORACIC ECHOCARDIOGRAM  11-17-2010   MILD LVH/ EF 60-65%/ GRADE I DIASTOLIC DYSFUNCTION/ MILD LAE / MILD RAE    OB History    No data available       Home Medications    Prior to Admission medications   Medication Sig Start Date End Date Taking? Authorizing Provider  acetaminophen (TYLENOL) 500 MG tablet Take 500 mg by mouth every 6 (six) hours as needed for pain.    Historical Provider, MD  amLODipine (NORVASC) 5 MG tablet Take 1/2 to 1 tablet daily as directed for BP 06/13/16 12/12/16  Lucky Cowboy, MD  apixaban (ELIQUIS) 5 MG TABS tablet Take 10 mg ( 2 tablets)  2 times daily for 7 days, then 5mg  (1 tablet) 2 times daily 06/12/16   Nishant Dhungel, MD  aspirin EC 81 MG tablet Take 1 tablet (81 mg total) by mouth daily. 03/30/16 03/30/17  Lucky Cowboy, MD  atorvastatin (LIPITOR) 80 MG tablet Take 1/2 to 1 tablet daily or as directed for Cholesterol 07/10/15   Lucky Cowboy, MD  azithromycin (ZITHROMAX) 250 MG tablet Take 2 tablets (500 mg) on  Day 1,  followed by 1 tablet (250 mg) once daily on Days 2 through 5. 06/15/16 06/20/16  Quentin Mulling, PA-C  benzonatate (TESSALON PERLES) 100 MG capsule Take 2 capsules (200 mg total) by mouth 3 (three) times daily as needed for cough (Max: 600mg  per day). 06/15/16   Quentin Mulling, PA-C  Cholecalciferol (VITAMIN D3) 5000 UNITS CAPS Take 1 capsule by mouth daily.    Historical Provider, MD  furosemide (LASIX) 40 MG tablet Take 1 tablet 2 x/ day for BP & Fluid 03/01/16 08/29/16  Lucky Cowboy, MD  losartan (COZAAR) 100 MG tablet Take 1 tablet daily for BP 03/01/16 08/29/16  Lucky Cowboy, MD  Magnesium Oxide 250 MG TABS Take 1 tablet by mouth daily.     Historical Provider, MD  metoprolol (LOPRESSOR) 50 MG tablet TAKE ONE TABLET BY MOUTH TWICE DAILY 11/01/15   Lucky Cowboy, MD  minoxidil (LONITEN) 10 MG tablet Take 1/2 to 1 tablet daily as directed  for BP 03/30/16 09/28/16  Lucky Cowboy, MD  mometasone (NASONEX) 50 MCG/ACT nasal spray Place 2 sprays into the nose daily. 09/04/14 06/08/16  Courtney Forcucci, PA-C  PROAIR HFA 108 (90 Base) MCG/ACT inhaler Inhale 1-2 puffs into the lungs every 4 (four) hours as needed for shortness of breath.  06/12/15   Historical Provider, MD    Family History Family History  Problem Relation Age of Onset  . Heart attack    . Heart attack Father   . Hypertension Brother   . Diabetes Brother   . Stroke Brother   . Sudden death Brother     Social History Social History  Substance Use Topics  . Smoking status: Never Smoker  . Smokeless tobacco: Never Used  . Alcohol use No     Allergies   Codeine; Hydrocodone; Oxycodone;  and Tramadol   Review of Systems Review of Systems  Constitutional: Positive for chills, fatigue and fever. Negative for diaphoresis.  HENT: Negative for rhinorrhea.   Respiratory: Positive for cough. Negative for hemoptysis, chest tightness, shortness of breath and wheezing.   Cardiovascular: Positive for leg swelling. Negative for chest pain.  Gastrointestinal: Negative for abdominal pain, diarrhea, nausea and vomiting.  Genitourinary: Positive for frequency. Negative for difficulty urinating and dysuria.  Musculoskeletal: Negative for back pain, neck pain and neck stiffness.  Skin: Negative for rash and wound.  Neurological: Negative for dizziness and headaches.  All other systems reviewed and are negative.    Physical Exam Updated Vital Signs BP 120/61 (BP Location: Right Arm)   Pulse 109   Temp 99 F (37.2 C) (Oral)   Resp 24   SpO2 94%   Physical Exam  Constitutional: She is oriented to person, place, and time. She appears well-developed and well-nourished. No distress.  HENT:  Head: Normocephalic and atraumatic.  Mouth/Throat: No oropharyngeal exudate.  Eyes: Conjunctivae are normal. Pupils are equal, round, and reactive to light.  Neck: Neck supple.   Cardiovascular: Regular rhythm and intact distal pulses.  Tachycardia present.   No murmur heard. Pulmonary/Chest: Effort normal. No stridor. Tachypnea noted. No respiratory distress. She has no decreased breath sounds. She has no wheezes. She has rhonchi. She has rales. She exhibits no tenderness.  Abdominal: Soft. There is tenderness (veru mild lower).  Musculoskeletal: She exhibits edema. She exhibits no tenderness.  Neurological: She is alert and oriented to person, place, and time. No cranial nerve deficit or sensory deficit. She exhibits normal muscle tone. Coordination normal.  Skin: Skin is warm and dry.  Psychiatric: She has a normal mood and affect.  Nursing note and vitals reviewed.    ED Treatments / Results  Labs (all labs ordered are listed, but only abnormal results are displayed) Labs Reviewed  COMPREHENSIVE METABOLIC PANEL - Abnormal; Notable for the following:       Result Value   Glucose, Bld 154 (*)    Creatinine, Ser 1.18 (*)    Total Bilirubin 1.7 (*)    GFR calc non Af Amer 48 (*)    GFR calc Af Amer 56 (*)    All other components within normal limits  CBC WITH DIFFERENTIAL/PLATELET - Abnormal; Notable for the following:    WBC 11.0 (*)    Neutro Abs 8.1 (*)    Monocytes Absolute 1.8 (*)    All other components within normal limits  PROTIME-INR - Abnormal; Notable for the following:    Prothrombin Time 19.4 (*)    All other components within normal limits  URINALYSIS, ROUTINE W REFLEX MICROSCOPIC - Abnormal; Notable for the following:    Hgb urine dipstick MODERATE (*)    Nitrite POSITIVE (*)    Leukocytes, UA SMALL (*)    Bacteria, UA MANY (*)    All other components within normal limits  BRAIN NATRIURETIC PEPTIDE - Abnormal; Notable for the following:    B Natriuretic Peptide 155.0 (*)    All other components within normal limits  TROPONIN I - Abnormal; Notable for the following:    Troponin I 0.05 (*)    All other components within normal limits    TROPONIN I - Abnormal; Notable for the following:    Troponin I 0.05 (*)    All other components within normal limits  TROPONIN I - Abnormal; Notable for the following:    Troponin I 0.05 (*)  All other components within normal limits  CBC WITH DIFFERENTIAL/PLATELET - Abnormal; Notable for the following:    RBC 3.78 (*)    Hemoglobin 10.7 (*)    HCT 33.1 (*)    Monocytes Absolute 1.7 (*)    All other components within normal limits  COMPREHENSIVE METABOLIC PANEL - Abnormal; Notable for the following:    Glucose, Bld 156 (*)    Creatinine, Ser 1.12 (*)    Calcium 8.1 (*)    Total Protein 6.0 (*)    Albumin 2.8 (*)    GFR calc non Af Amer 51 (*)    GFR calc Af Amer 59 (*)    All other components within normal limits  PROTIME-INR - Abnormal; Notable for the following:    Prothrombin Time 18.4 (*)    All other components within normal limits  APTT - Abnormal; Notable for the following:    aPTT 47 (*)    All other components within normal limits  HEPARIN LEVEL (UNFRACTIONATED) - Abnormal; Notable for the following:    Heparin Unfractionated >2.20 (*)    All other components within normal limits  HEPARIN LEVEL (UNFRACTIONATED) - Abnormal; Notable for the following:    Heparin Unfractionated >2.20 (*)    All other components within normal limits  APTT - Abnormal; Notable for the following:    aPTT 175 (*)    All other components within normal limits  I-STAT BETA HCG BLOOD, ED ([facility], [facility], [facility] ONLY) - Abnormal; Notable for the following:    I-stat hCG, quantitative 5.4 (*)    All other components within normal limits  CULTURE, BLOOD (ROUTINE X 2)  CULTURE, BLOOD (ROUTINE X 2)  URINE CULTURE  TSH  LACTIC ACID, PLASMA  LACTIC ACID, PLASMA  PROCALCITONIN  I-STAT CG4 LACTIC ACID, ED  I-STAT CG4 LACTIC ACID, ED    EKG  EKG Interpretation  Date/Time:  Thursday June 16 2016 16:48:48 EST Ventricular Rate:  123 PR Interval:    QRS Duration: 125 QT Interval:  350 QTC  Calculation: 501 R Axis:   -71 Text Interpretation:  Sinus tachycardia RBBB and LAFB Left ventricular hypertrophy EKG similar to prior.  No STEMI Confirmed by Rush LandmarkEGELER MD, CHRISTOPHER [phone]([id number]) on 06/16/2016 5:15:28 PM       Radiology Dg Chest 2 View  Result Date: 06/16/2016 CLINICAL DATA:  Recent pulmonary embolism. Dyspnea and nonproductive cough today. EXAM: CHEST  2 VIEW COMPARISON:  06/08/2016 FINDINGS: Low lung volumes. No confluent consolidation. No effusions. Unchanged cardiomegaly. Pulmonary vasculature is normal. Hilar and mediastinal contours are unremarkable and unchanged. IMPRESSION: Cardiomegaly.  Low lung volumes.  No consolidation or effusion. Electronically Signed   By: Ellery Plunkaniel R Mitchell M.D.   On: 06/16/2016 18:16   Ct Angio Chest Pe W Or Wo Contrast  Result Date: 06/16/2016 CLINICAL DATA:  Shortness of breath dry cough and fever EXAM: CT ANGIOGRAPHY CHEST WITH CONTRAST TECHNIQUE: Multidetector CT imaging of the chest was performed using the standard protocol during bolus administration of intravenous contrast. Multiplanar CT image reconstructions and MIPs were obtained to evaluate the vascular anatomy. CONTRAST:  125 mL Isovue 370 intravenous COMPARISON:  Chest x-ray 06/16/2016, CT chest 06/08/2016, 11/21/2012 FINDINGS: Cardiovascular: Examination is limited by patient's large body habitus and respiratory motion artifact. Contrast bolus is sub optimal for evaluation of lower order pulmonary artery branches. Main pulmonary artery trunk is enlarged, measuring 3.6 cm. Previously noted saddle embolus is not identified. Emboli within the distal right pulmonary artery are not clearly identified on today's  study. There is residual nonocclusive thrombus within the distal left pulmonary artery that extends into segmental and subsegmental branches of left lower lobe pulmonary arteries. There is no new thrombus. Overall thrombus appears slightly decreased with slightly improved enhancement of  subsegmental lower lobe pulmonary artery's on the left side. RV LV ratio remains elevated at 1.1, previously 1.2. Thoracic aorta is non aneurysmal. No dissection. There is mild cardiomegaly. No pericardial effusion. Atherosclerosis is present. Mediastinum/Nodes: No grossly enlarged mediastinal or hilar nodes. Trachea is unremarkable. Image thyroid within normal limits. Esophagus is within normal limits. Lungs/Pleura: Stable 4 mm nodule right middle lobe, series 11, image number 37. No acute consolidation or effusion. No pneumothorax. Upper Abdomen: No acute abnormality. Musculoskeletal: Degenerative changes of the spine. No acute abnormality. Review of the MIP images confirms the above findings. IMPRESSION: 1. The examination is compromised by patient's large body habitus and excessive respiratory motion artifact as well as suboptimal contrast bolus. The previously noted saddle embolus has resolved and the previously noted right-sided pulmonary emboli are not clearly identified on the current exam. There is residual but smaller thrombus within the distal left main pulmonary artery with extension into segmental and subsegmental branches of left lower lobe pulmonary arteries, however there is improved flow within the lower order pulmonary arterial branches on the left. There is no new thrombus visualized. RV LV ratio remains elevated at 1.1 consistent with right heart strain, but improved compared with 1.2 previously. Please note that evaluation of the RV LV ratio was difficult due to respiratory motion artifact. 2. Lung fields show no acute infiltrate or effusion. Electronically Signed   By: Jasmine Pang M.D.   On: 06/16/2016 23:09    Procedures Procedures (including critical care time)  Medications Ordered in ED Medications  aspirin EC tablet 81 mg (81 mg Oral Given 06/16/16 2338)  metoprolol tartrate (LOPRESSOR) tablet 50 mg (50 mg Oral Given 06/16/16 2338)  atorvastatin (LIPITOR) tablet 40 mg (40 mg  Oral Given 06/16/16 2338)  fluticasone (FLONASE) 50 MCG/ACT nasal spray 1 spray (not administered)  magnesium oxide (MAG-OX) tablet 400 mg (not administered)  cholecalciferol (VITAMIN D) tablet 5,000 Units (not administered)  acetaminophen (TYLENOL) tablet 650 mg (650 mg Oral Given 06/16/16 2342)    Or  acetaminophen (TYLENOL) suppository 650 mg ( Rectal See Alternative 06/16/16 2342)  ondansetron (ZOFRAN) tablet 4 mg (not administered)    Or  ondansetron (ZOFRAN) injection 4 mg (not administered)  levalbuterol (XOPENEX) nebulizer solution 0.63 mg (not administered)  iopamidol (ISOVUE-370) 76 % injection (not administered)  iopamidol (ISOVUE-370) 76 % injection (not administered)  chlorhexidine (PERIDEX) 0.12 % solution 15 mL (not administered)  MEDLINE mouth rinse (not administered)  apixaban (ELIQUIS) tablet 10 mg (not administered)    Followed by  apixaban (ELIQUIS) tablet 5 mg (not administered)  cefTRIAXone (ROCEPHIN) 1 g in dextrose 5 % 50 mL IVPB (not administered)  albuterol (PROVENTIL) (2.5 MG/3ML) 0.083% nebulizer solution 5 mg (5 mg Nebulization Given 06/16/16 1740)  cefTRIAXone (ROCEPHIN) 1 g in dextrose 5 % 50 mL IVPB (0 g Intravenous Stopped 06/16/16 2158)  piperacillin-tazobactam (ZOSYN) IVPB 3.375 g (3.375 g Intravenous Given 06/16/16 2355)  vancomycin (VANCOCIN) IVPB 1000 mg/200 mL premix (1,000 mg Intravenous Given 06/17/16 0014)  iopamidol (ISOVUE-370) 76 % injection 125 mL (125 mLs Intravenous Contrast Given 06/16/16 2232)  vancomycin (VANCOCIN) 1,500 mg in sodium chloride 0.9 % 500 mL IVPB (1,500 mg Intravenous Given 06/17/16 0117)     Initial Impression / Assessment and Plan / ED  Course  I have reviewed the triage vital signs and the nursing notes.  Pertinent labs & imaging results that were available during my care of the patient were reviewed by me and considered in my medical decision making (see chart for details).  Clinical Course     SHIVANGI LUTZ is a  65 y.o. 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  History and exam are seen above.  On exam, patient has course breath sounds bilaterally. Patient has some very mild abdominal tenderness. No back tenderness. Lower extremities are edematous. No focal neurologic deficits and pulses are intact.  Based on history, suspect pneumonia with the tachycardia, tachypnea, shortness of breath, and course breath sounds. Chest x-ray was obtained and showed no evidence of pneumonia.  Laboratory testing was otherwise performed and shows evidence of urinary tract infection. With patient's report of frequency and darkened urine, patient will be treated with Rocephin. Given her persistent tachycardia and tachypnea, suspect UTI is the source of her symptoms. Lactic acid nonelevated. Troponin slightly elevated however this is improved from prior.  Worsen pulmonary embolism is considered given tachycardia in shortness of breath however, patient will initially be treated for UTI and admitted. Will defer to admitting team for necessity of repeat CT scan.  Patient admitted to hospitalist service for further management of UTI and vital sign abnormalities. With lower extremity edema worsening and course breath sounds, feel patient will need close monitoring for rehydration. Patient admitted in stable condition.   Final Clinical Impressions(s) / ED Diagnoses   Final diagnoses:  Acute cystitis without hematuria     Clinical Impression: 1. SOB (shortness of breath)   2. Tachycardia     Disposition: Admit to Hospitalist service    Heide Scales, MD 06/17/16 1017

## 2016-06-16 NOTE — Telephone Encounter (Signed)
[facility] RN called to To make you aware patient has temp of 100.3 at today's visit. Also, please place order for OT, and PT evaluation and Treat. fax to [phone]

## 2016-06-16 NOTE — ED Triage Notes (Addendum)
Patient here from home with complaints of SOB, dry cough, and fever. Hx of sleep apnea, reports that sleeps with CPAP at night- states that she will need respiratory to help her with her personal one. SOB increased with exertion. Hx of PE and DVT.

## 2016-06-16 NOTE — Telephone Encounter (Signed)
CPAP pressure will be reduced to 8 cm for comfort.

## 2016-06-16 NOTE — Telephone Encounter (Signed)
PCP office contacted us stating that patient is having trouble with her CPAP blowing in her face. Dr. Frances FurbishAthar looked at her CPAP download (Dr. Vickey Hugerohmeier is out of the office) and recommended that her pressure be decreased from 9 to 8cm.

## 2016-06-16 NOTE — Telephone Encounter (Signed)
Per Quentin MullingAmanda Collier, advised patient's person of contact, Felicia, patient is to go to the ED, for chest xray, and possible abx, may have Hospital Acquired Pneumonia, Felicia understands and has contacted patient's daughter and [facility].

## 2016-06-16 NOTE — H&P (Signed)
History and Physical    Kerry Liu WUJ:[id number] DOB: 03/30/1951 DOA: 06/16/2016  PCP: Nadean CorwinMCKEOWN,WILLIAM DAVID, MD  Patient coming from: Home.  Chief Complaint: Shortness of breath.  HPI: Kerry Liu is a 65 y.o. female with history of recurrent PE/DVT recently admitted for PE and discharged 4 days ago presents to the ER because of worsening shortness of breath tachycardic. Patient states after discharge patient started developing cough and shortness of breath and had gone to the PCP yesterday and was prescribed antibiotics. Patient started developing fever and chills and patient's physical therapist found patient was tachycardic and hypoxic and was referred to the ER. Patient states she also had a fever of 101F at home. In the ER patient was found to be tachycardic with heart rate around 120 to 130s. UA shows features consistent with UTI. Chest x-ray was unremarkable. Patient is being admitted for SIRS and acute hypoxic respiratory failure probably from developing sepsis from UTI.   ED Course: Chest x-ray unremarkable. EKG shows sinus tachycardia. UA shows features consistent with UTI.  Review of Systems: As per HPI, rest all negative.   Past Medical History:  Diagnosis Date  . Acute meniscal tear of knee    RIGHT  . Arthritis    "right knee" (11/21/2012)  . Elevated hemoglobin A1c   . Exertional shortness of breath    "this week" (11/21/2012)  . History of DVT (deep vein thrombosis)    IN PREGNANCY  . HTN (hypertension)    CARDIOLOGIST--  DR HOCHREIN-- CLEARANCE NOTE IN EPIC AND CHART FROM 08-21-2012  . Mixed hyperlipidemia   . Obesity   . Pulmonary embolism (HCC)    "3 today" (11/21/2012)  . RBBB (right bundle branch block with left anterior fascicular block)   . Seasonal allergies   . Sinus tachycardia   . Sleep apnea   . Vitamin D deficiency     Past Surgical History:  Procedure Laterality Date  . IVC filter placed in May 2014 N/A   . KNEE ARTHROSCOPY Right  12-09-2002  . KNEE ARTHROSCOPY WITH MEDIAL MENISECTOMY Right 10/05/2012   Procedure: KNEE ARTHROSCOPY WITH MEDIAL MENISECTOMY;  Surgeon: Jacki Conesonald A Gioffre, MD;  [facility] [place];  Service: Orthopedics;  Laterality: Right;  . TOTAL ABDOMINAL HYSTERECTOMY W/ BILATERAL SALPINGOOPHORECTOMY  ~ 1986   W/ BILATERAL SALPINGOOPHORECTOMY  . TRANSTHORACIC ECHOCARDIOGRAM  11-17-2010   MILD LVH/ EF 60-65%/ GRADE I DIASTOLIC DYSFUNCTION/ MILD LAE / MILD RAE     reports that she has never smoked. She has never used smokeless tobacco. She reports that she does not drink alcohol or use drugs.  Allergies  Allergen Reactions  . Codeine Other (See Comments)    Unknown  . Hydrocodone     Itch  . Oxycodone Rash  . Tramadol Rash    Family History  Problem Relation Age of Onset  . Heart attack    . Heart attack Father   . Hypertension Brother   . Diabetes Brother   . Stroke Brother   . Sudden death Brother     Prior to Admission medications   Medication Sig Start Date End Date Taking? Authorizing Provider  acetaminophen (TYLENOL) 500 MG tablet Take 500 mg by mouth every 6 (six) hours as needed for pain.   Yes Historical Provider, MD  amLODipine (NORVASC) 5 MG tablet Take 1/2 to 1 tablet daily as directed for BP Patient taking differently: Take 5 mg by mouth daily.  06/13/16 12/12/16 Yes Lucky CowboyWilliam McKeown, MD  apixaban (ELIQUIS) 5 MG TABS tablet Take 10 mg ( 2 tablets)  2 times daily for 7 days, then 5mg  (1 tablet) 2 times daily Patient taking differently: Take 5-10 mg by mouth 4 (four) times daily. Take 5mg  QID until 06/19/16 then take 5mg  BID 06/12/16  Yes Nishant Dhungel, MD  aspirin EC 81 MG tablet Take 1 tablet (81 mg total) by mouth daily. 03/30/16 03/30/17 Yes Lucky Cowboy, MD  atorvastatin (LIPITOR) 80 MG tablet Take 1/2 to 1 tablet daily or as directed for Cholesterol Patient taking differently: Take 40 mg by mouth every evening.  07/10/15  Yes Lucky Cowboy, MD    azithromycin (ZITHROMAX) 250 MG tablet Take 2 tablets (500 mg) on  Day 1,  followed by 1 tablet (250 mg) once daily on Days 2 through 5. 06/15/16 06/20/16 Yes Quentin Mulling, PA-C  benzonatate (TESSALON PERLES) 100 MG capsule Take 2 capsules (200 mg total) by mouth 3 (three) times daily as needed for cough (Max: 600mg  per day). 06/15/16  Yes Quentin Mulling, PA-C  Cholecalciferol (VITAMIN D3) 5000 UNITS CAPS Take 1 capsule by mouth daily.   Yes Historical Provider, MD  furosemide (LASIX) 40 MG tablet Take 1 tablet 2 x/ day for BP & Fluid Patient taking differently: Take 20 mg by mouth daily.  03/01/16 08/29/16 Yes Lucky Cowboy, MD  losartan (COZAAR) 100 MG tablet Take 1 tablet daily for BP Patient taking differently: Take 100 mg by mouth daily.  03/01/16 08/29/16 Yes Lucky Cowboy, MD  Magnesium Oxide 250 MG TABS Take 1 tablet by mouth daily as needed (constipation).    Yes Historical Provider, MD  metoprolol (LOPRESSOR) 50 MG tablet TAKE ONE TABLET BY MOUTH TWICE DAILY Patient taking differently: TAKE 50mg  TABLET BY MOUTH TWICE DAILY 11/01/15  Yes Lucky Cowboy, MD  minoxidil (LONITEN) 10 MG tablet Take 1/2 to 1 tablet daily as directed for BP Patient taking differently: Take 10 mg by mouth daily.  03/30/16 09/28/16 Yes Lucky Cowboy, MD  mometasone (NASONEX) 50 MCG/ACT nasal spray Place 2 sprays into the nose daily. Patient taking differently: Place 2 sprays into the nose 2 (two) times daily.  09/04/14 06/16/16 Yes Courtney Forcucci, PA-C  PROAIR HFA 108 (90 Base) MCG/ACT inhaler Inhale 1-2 puffs into the lungs every 4 (four) hours as needed for shortness of breath.  06/12/15   Historical Provider, MD    Physical Exam: Vitals:   06/16/16 1927 06/16/16 1931 06/16/16 2000 06/16/16 2030  BP: 115/59  117/61 129/77  Pulse: (!) 124     Resp: 24  (!) 30 26  Temp: 98.6 F (37 C)     TempSrc: Oral     SpO2: 99% 96%    Weight:      Height:          Constitutional: Obese not in distress. Vitals:    06/16/16 1927 06/16/16 1931 06/16/16 2000 06/16/16 2030  BP: 115/59  117/61 129/77  Pulse: (!) 124     Resp: 24  (!) 30 26  Temp: 98.6 F (37 C)     TempSrc: Oral     SpO2: 99% 96%    Weight:      Height:       Eyes: Anicteric no pallor. ENMT: No discharge from the ears eyes nose or mouth. Neck: No mass felt. No neck rigidity. Respiratory: No rhonchi or crepitations. Cardiovascular: S1-S2 heard. No murmurs appreciated. Abdomen: Soft nontender bowel sounds present. No guarding or rigidity. Musculoskeletal: No edema. Skin: No rash. Chronic  skin changes. Neurologic: Alert awake oriented to time place and person. Moves all extremities. Psychiatric: Appears normal. Normal affect.   Labs on Admission: I have personally reviewed following labs and imaging studies  CBC:  Recent Labs Lab 06/10/16 0536 06/11/16 0520 06/12/16 0548 06/15/16 1702 06/16/16 1712  WBC 7.8 7.6 7.0 10.2 11.0*  NEUTROABS  --   --   --  8,160* 8.1*  HGB 11.0* 10.8* 11.1* 12.9 12.5  HCT 35.0* 34.0* 34.6* 40.5 38.7  MCV 90.2 87.4 89.2 88.4 89.2  PLT 213 217 224 329 232   Basic Metabolic Panel:  Recent Labs Lab 06/10/16 0536 06/15/16 1702 06/16/16 1712  NA 141 141 139  K 4.6 4.4 4.2  CL 111 104 104  CO2 25 27 26   GLUCOSE 134* 137* 154*  BUN 32* 15 17  CREATININE 1.09* 0.98 1.18*  CALCIUM 8.5* 9.1 9.0   GFR: Estimated Creatinine Clearance: 67.9 mL/min (by C-G formula based on SCr of 1.18 mg/dL (H)). Liver Function Tests:  Recent Labs Lab 06/15/16 1702 06/16/16 1712  AST 22 33  ALT 42* 44  ALKPHOS 88 82  BILITOT 0.7 1.7*  PROT 6.7 7.1  ALBUMIN 3.7 3.6   No results for input(s): LIPASE, AMYLASE in the last 168 hours. No results for input(s): AMMONIA in the last 168 hours. Coagulation Profile:  Recent Labs Lab 06/16/16 1712  INR 1.62   Cardiac Enzymes:  Recent Labs Lab 06/16/16 1712  TROPONINI 0.05*   BNP (last 3 results) No results for input(s): PROBNP in the last  8760 hours. HbA1C: No results for input(s): HGBA1C in the last 72 hours. CBG:  Recent Labs Lab 06/10/16 0752 06/11/16 0734 06/12/16 0727  GLUCAP 203* 186* 111*   Lipid Profile: No results for input(s): CHOL, HDL, LDLCALC, TRIG, CHOLHDL, LDLDIRECT in the last 72 hours. Thyroid Function Tests: No results for input(s): TSH, T4TOTAL, FREET4, T3FREE, THYROIDAB in the last 72 hours. Anemia Panel: No results for input(s): VITAMINB12, FOLATE, FERRITIN, TIBC, IRON, RETICCTPCT in the last 72 hours. Urine analysis:    Component Value Date/Time   COLORURINE YELLOW 06/16/2016 1702   APPEARANCEUR CLEAR 06/16/2016 1702   LABSPEC 1.017 06/16/2016 1702   PHURINE 5.0 06/16/2016 1702   GLUCOSEU NEGATIVE 06/16/2016 1702   HGBUR MODERATE (A) 06/16/2016 1702   BILIRUBINUR NEGATIVE 06/16/2016 1702   KETONESUR NEGATIVE 06/16/2016 1702   PROTEINUR NEGATIVE 06/16/2016 1702   NITRITE POSITIVE (A) 06/16/2016 1702   LEUKOCYTESUR SMALL (A) 06/16/2016 1702   Sepsis Labs: @LABRCNTIP (procalcitonin:4,lacticidven:4) ) Recent Results (from the past 240 hour(s))  MRSA PCR Screening     Status: None   Collection Time: 06/08/16  3:50 PM  Result Value Ref Range Status   MRSA by PCR NEGATIVE NEGATIVE Final    Comment:        The GeneXpert MRSA Assay (FDA approved for NASAL specimens only), is one component of a comprehensive MRSA colonization surveillance program. It is not intended to diagnose MRSA infection nor to guide or monitor treatment for MRSA infections.      Radiological Exams on Admission: Dg Chest 2 View  Result Date: 06/16/2016 CLINICAL DATA:  Recent pulmonary embolism. Dyspnea and nonproductive cough today. EXAM: CHEST  2 VIEW COMPARISON:  06/08/2016 FINDINGS: Low lung volumes. No confluent consolidation. No effusions. Unchanged cardiomegaly. Pulmonary vasculature is normal. Hilar and mediastinal contours are unremarkable and unchanged. IMPRESSION: Cardiomegaly.  Low lung volumes.  No  consolidation or effusion. Electronically Signed   By: Rosey Bath.D.  On: 06/16/2016 18:16    EKG: Independently reviewed. Sinus tachycardia.  Assessment/Plan Principal Problem:   SIRS (systemic inflammatory response syndrome) (HCC) Active Problems:   Essential hypertension   Recurrent pulmonary emboli (HCC)   OSA on CPAP   Elevated troponin   Acute respiratory failure with hypoxemia (HCC)    1. SIRS with possible developing sepsis with acute hypoxic respiratory failure - possible source could be UTI. I have ordered blood cultures procalcitonin levels lactate levels and continue hydration. Patient is placed on vancomycin and Zosyn until blood cultures available. 2. Recent diagnosis of PE with hypoxic respiratory failure with a history of recurrent PE and status post IVC placement - I have ordered CT angiogram of the chest to make sure there is no worsening of patient's PE. Until then we will keep patient on heparin infusion and hold Apixaban. Patient has had a recent 2-D echo which showed EF of 65-70% with grade 1 diastolic dysfunction. Troponin mildly elevated, continue to trend. Denies any chest pain. 3. Hypertension - holding all antihypertensives except beta blockers since patient's blood pressures in the low normal. Metoprolol will be dosed tomorrow morning. May need to hold metoprolol if patient's blood pressure remains low normal.  4. OSA on CPAP. 5. Morbid obesity.   DVT prophylaxis: Heparin. Code Status: Full code.  Family Communication: Patient's daughter.  Disposition Plan: Home.  Consults called: None.  Admission status: Inpatient.    Eduard ClosKAKRAKANDY,Arshan Jabs N. MD [facility] Pager [phone].  If 7PM-7AM, please contact night-coverage [email or web address] Password [email or web address]  06/16/2016, 9:56 PM

## 2016-06-16 NOTE — Addendum Note (Signed)
Addended by: Quentin MullingOLLIER, AMANDA R on: 06/16/2016 01:31 PM   Modules accepted: Orders

## 2016-06-16 NOTE — ED Notes (Signed)
I&O cath prolonged due to Pharmacy tech in room, will attempt once pharm tech is finished

## 2016-06-16 NOTE — Telephone Encounter (Signed)
I spoke to the daughter and she is aware that these changes will be made.

## 2016-06-16 NOTE — Progress Notes (Signed)
ANTICOAGULATION CONSULT NOTE  Pharmacy Consult for IV heparin Indication: pulmonary embolus  Allergies  Allergen Reactions  . Codeine Other (See Comments)    Unknown  . Hydrocodone     Itch  . Oxycodone Rash  . Tramadol Rash    Patient Measurements: Height: 5\' 2"  (157.5 cm) Weight: (!) 320 lb (145.2 kg) IBW/kg (Calculated) : 50.1 Heparin Dosing Weight: dosing per [place] nomogram  Vital Signs: Temp: 98.6 F (37 C) (12/21 1927) Temp Source: Oral (12/21 1927) BP: 129/77 (12/21 2030) Pulse Rate: 124 (12/21 1927)  Labs:  Recent Labs  06/15/16 1702 06/16/16 1712  HGB 12.9 12.5  HCT 40.5 38.7  PLT 329 232  LABPROT  --  19.4*  INR  --  1.62  CREATININE 0.98 1.18*  TROPONINI  --  0.05*    Estimated Creatinine Clearance: 67.9 mL/min (by C-G formula based on SCr of 1.18 mg/dL (H)).   Medical History: Past Medical History:  Diagnosis Date  . Acute meniscal tear of knee    RIGHT  . Arthritis    "right knee" (11/21/2012)  . Elevated hemoglobin A1c   . Exertional shortness of breath    "this week" (11/21/2012)  . History of DVT (deep vein thrombosis)    IN PREGNANCY  . HTN (hypertension)    CARDIOLOGIST--  DR HOCHREIN-- CLEARANCE NOTE IN EPIC AND CHART FROM 08-21-2012  . Mixed hyperlipidemia   . Obesity   . Pulmonary embolism (HCC)    "3 today" (11/21/2012)  . RBBB (right bundle branch block with left anterior fascicular block)   . Seasonal allergies   . Sinus tachycardia   . Sleep apnea   . Vitamin D deficiency     Medications:   (Not in a hospital admission) Scheduled:  . aspirin EC  81 mg Oral Daily  . atorvastatin  40 mg Oral QPM  . fluticasone  1 spray Each Nare Daily  . iopamidol      . [START ON 06/17/2016] metoprolol  50 mg Oral BID  . Vitamin D3  1 capsule Oral Daily   Infusions:  . sodium chloride    . heparin    . piperacillin-tazobactam    . vancomycin      Assessment: 65 y.o. female with a past medical history significant for  hypertension, CHF, diabetes, history of DVT and PE status post IVC filter and recent diagnosis of recurrent PE currently on Eliquis status post hospitalization last week who presents with fevers, chills, cough, and shortness of breath. Admitting for sepsis d/t pneumonia. Pharmacy to dose heparin while off Eliquis (not clear at this point if Eliquis on hold, unable to take, or concern for bleeding).   Baseline INR slightly elevated as expected with FXaI, aPTT in progress  Prior anticoagulation: Was supposed to be taking Eliquis 10 mg bid until 12/24, but reports taking 5 mg QID during this phase of tx. Only dose today taken at 10am  Significant events:  Today, 06/16/2016:  CBC: wnl  No bleeding or infusion issues per nursing  CrCl: 68 ml/min  Goal of Therapy: Heparin level 0.3-0.7 units/ml Monitor platelets by anticoagulation protocol: Yes  Plan:  No bolus d/t Eliquis, will start heparin 12 hrs after last Eliquis dose  Heparin 1700 units/hr IV infusion  Check heparin level 6 hrs after start  Daily CBC, daily heparin level once stable  Monitor for signs of bleeding or thrombosis   Bernadene Personrew Adalberto Metzgar, PharmD Pager: [phone] 06/16/2016, 10:14 PM

## 2016-06-17 ENCOUNTER — Encounter (HOSPITAL_COMMUNITY): Payer: Self-pay

## 2016-06-17 DIAGNOSIS — I10 Essential (primary) hypertension: Secondary | ICD-10-CM

## 2016-06-17 DIAGNOSIS — G4733 Obstructive sleep apnea (adult) (pediatric): Secondary | ICD-10-CM

## 2016-06-17 DIAGNOSIS — Z9989 Dependence on other enabling machines and devices: Secondary | ICD-10-CM

## 2016-06-17 LAB — CBC WITH DIFFERENTIAL/PLATELET
BASOS ABS: 0 10*3/uL (ref 0.0–0.1)
BASOS PCT: 0 %
EOS ABS: 0.1 10*3/uL (ref 0.0–0.7)
EOS PCT: 1 %
HCT: 33.1 % — ABNORMAL LOW (ref 36.0–46.0)
HEMOGLOBIN: 10.7 g/dL — AB (ref 12.0–15.0)
LYMPHS ABS: 1.3 10*3/uL (ref 0.7–4.0)
Lymphocytes Relative: 13 %
MCH: 28.3 pg (ref 26.0–34.0)
MCHC: 32.3 g/dL (ref 30.0–36.0)
MCV: 87.6 fL (ref 78.0–100.0)
Monocytes Absolute: 1.7 10*3/uL — ABNORMAL HIGH (ref 0.1–1.0)
Monocytes Relative: 16 %
NEUTROS PCT: 70 %
Neutro Abs: 7.3 10*3/uL (ref 1.7–7.7)
PLATELETS: 249 10*3/uL (ref 150–400)
RBC: 3.78 MIL/uL — AB (ref 3.87–5.11)
RDW: 13 % (ref 11.5–15.5)
WBC: 10.5 10*3/uL (ref 4.0–10.5)

## 2016-06-17 LAB — COMPREHENSIVE METABOLIC PANEL
ALBUMIN: 2.8 g/dL — AB (ref 3.5–5.0)
ALK PHOS: 72 U/L (ref 38–126)
ALT: 47 U/L (ref 14–54)
AST: 36 U/L (ref 15–41)
Anion gap: 5 (ref 5–15)
BUN: 15 mg/dL (ref 6–20)
CHLORIDE: 109 mmol/L (ref 101–111)
CO2: 26 mmol/L (ref 22–32)
CREATININE: 1.12 mg/dL — AB (ref 0.44–1.00)
Calcium: 8.1 mg/dL — ABNORMAL LOW (ref 8.9–10.3)
GFR calc non Af Amer: 51 mL/min — ABNORMAL LOW (ref >60)
GFR, EST AFRICAN AMERICAN: 59 mL/min — AB (ref >60)
GLUCOSE: 156 mg/dL — AB (ref 65–99)
Potassium: 4.2 mmol/L (ref 3.5–5.1)
SODIUM: 140 mmol/L (ref 135–145)
Total Bilirubin: 1.2 mg/dL (ref 0.3–1.2)
Total Protein: 6 g/dL — ABNORMAL LOW (ref 6.5–8.1)

## 2016-06-17 LAB — LACTIC ACID, PLASMA
LACTIC ACID, VENOUS: 0.7 mmol/L (ref 0.5–1.9)
Lactic Acid, Venous: 1.5 mmol/L (ref 0.5–1.9)

## 2016-06-17 LAB — HEPARIN LEVEL (UNFRACTIONATED): Heparin Unfractionated: >2.2 IU/mL — ABNORMAL HIGH (ref 0.30–0.70)

## 2016-06-17 LAB — APTT: APTT: 175 s — AB (ref 24–36)

## 2016-06-17 LAB — TROPONIN I
TROPONIN I: 0.05 ng/mL — AB (ref <0.03)
TROPONIN I: 0.05 ng/mL — AB (ref <0.03)

## 2016-06-17 LAB — PROCALCITONIN

## 2016-06-17 LAB — TSH: TSH: 3.966 u[IU]/mL (ref 0.350–4.500)

## 2016-06-17 MED ORDER — APIXABAN 5 MG PO TABS
10.0000 mg | ORAL_TABLET | Freq: Two times a day (BID) | ORAL | Status: AC
  Administered 2016-06-17 – 2016-06-18 (×4): 10 mg via ORAL
  Filled 2016-06-17 (×4): qty 2

## 2016-06-17 MED ORDER — DEXTROSE 5 % IV SOLN
1.0000 g | INTRAVENOUS | Status: DC
  Administered 2016-06-17 – 2016-06-18 (×2): 1 g via INTRAVENOUS
  Filled 2016-06-17 (×3): qty 10

## 2016-06-17 MED ORDER — HEPARIN (PORCINE) IN NACL 100-0.45 UNIT/ML-% IJ SOLN
1400.0000 [IU]/h | INTRAMUSCULAR | Status: DC
  Filled 2016-06-17: qty 250

## 2016-06-17 MED ORDER — APIXABAN 5 MG PO TABS
5.0000 mg | ORAL_TABLET | Freq: Two times a day (BID) | ORAL | Status: DC
  Administered 2016-06-19: 5 mg via ORAL
  Filled 2016-06-17: qty 1

## 2016-06-17 NOTE — Progress Notes (Signed)
PT Cancellation Note  Patient Details Name: Kerry Liu MRN: [id number] DOB: 08/24/1950   Cancelled Treatment:    Reason Eval/Treat Not Completed: Other (comment)patient  Has  Been up to bathroom, then up in recliner, she plans to walk in the hall later, has visitors currentty. Will check back in the AM,  Patient states that she is requesting a 4 wheeled RW and BSC.  Rada HayHill, Enrico Eaddy Elizabeth 06/17/2016, 4:48 PM Blanchard KelchKaren Kareena Arrambide PT [phone]

## 2016-06-17 NOTE — Progress Notes (Addendum)
ANTICOAGULATION CONSULT NOTE  Pharmacy Consult for IV heparin Indication: hx recent pulmonary embolus; home Eliquis on hold  Allergies  Allergen Reactions  . Codeine Other (See Comments)    Unknown  . Hydrocodone     Itch  . Oxycodone Rash  . Tramadol Rash    Patient Measurements: Height: 5\' 2"  (157.5 cm) Weight: (!) 340 lb (154.2 kg) IBW/kg (Calculated) : 50.1 Heparin Dosing Weight: 87 kg  Vital Signs: Temp: 100 F (37.8 C) (12/21 2316) Temp Source: Oral (12/21 2316) BP: 111/65 (12/22 0600) Pulse Rate: 78 (12/22 0600)  Labs:  Recent Labs  06/15/16 1702 06/16/16 1712 06/16/16 2324 06/17/16 0357 06/17/16 0557  HGB 12.9 12.5  --  10.7*  --   HCT 40.5 38.7  --  33.1*  --   PLT 329 232  --  249  --   APTT  --   --  47*  --  175*  LABPROT  --  19.4* 18.4*  --   --   INR  --  1.62 1.51  --   --   HEPARINUNFRC  --   --   --  >2.20* >2.20*  CREATININE 0.98 1.18*  --  1.12*  --   TROPONINI  --  0.05* 0.05* 0.05*  --     Estimated Creatinine Clearance: 74.4 mL/min (by C-G formula based on SCr of 1.12 mg/dL (H)).   Medical History: Past Medical History:  Diagnosis Date  . Acute meniscal tear of knee    RIGHT  . Arthritis    "right knee" (11/21/2012)  . Elevated hemoglobin A1c   . Exertional shortness of breath    "this week" (11/21/2012)  . History of DVT (deep vein thrombosis)    IN PREGNANCY  . HTN (hypertension)    CARDIOLOGIST--  DR HOCHREIN-- CLEARANCE NOTE IN EPIC AND CHART FROM 08-21-2012  . Mixed hyperlipidemia   . Obesity   . Pulmonary embolism (HCC)    "3 today" (11/21/2012)  . RBBB (right bundle branch block with left anterior fascicular block)   . Seasonal allergies   . Sinus tachycardia   . Sleep apnea   . Vitamin D deficiency     Medications:  Prescriptions Prior to Admission  Medication Sig Dispense Refill Last Dose  . acetaminophen (TYLENOL) 500 MG tablet Take 500 mg by mouth every 6 (six) hours as needed for pain.   unknown  .  amLODipine (NORVASC) 5 MG tablet Take 1/2 to 1 tablet daily as directed for BP (Patient taking differently: Take 5 mg by mouth daily. ) 90 tablet 1 06/16/2016 at Unknown time  . apixaban (ELIQUIS) 5 MG TABS tablet Take 10 mg ( 2 tablets)  2 times daily for 7 days, then 5mg  (1 tablet) 2 times daily (Patient taking differently: Take 5-10 mg by mouth 4 (four) times daily. Take 5mg  QID until 06/19/16 then take 5mg  BID) 74 tablet 0 06/16/2016 at 1000  . aspirin EC 81 MG tablet Take 1 tablet (81 mg total) by mouth daily. 1 tablet 0 06/16/2016 at Unknown time  . atorvastatin (LIPITOR) 80 MG tablet Take 1/2 to 1 tablet daily or as directed for Cholesterol (Patient taking differently: Take 40 mg by mouth every evening. ) 30 tablet 11 06/15/2016 at Unknown time  . azithromycin (ZITHROMAX) 250 MG tablet Take 2 tablets (500 mg) on  Day 1,  followed by 1 tablet (250 mg) once daily on Days 2 through 5. 6 each 1 06/15/2016 at Unknown time  .  benzonatate (TESSALON PERLES) 100 MG capsule Take 2 capsules (200 mg total) by mouth 3 (three) times daily as needed for cough (Max: 600mg  per day). 60 capsule 0 06/15/2016 at Unknown time  . Cholecalciferol (VITAMIN D3) 5000 UNITS CAPS Take 1 capsule by mouth daily.   06/16/2016 at Unknown time  . furosemide (LASIX) 40 MG tablet Take 1 tablet 2 x/ day for BP & Fluid (Patient taking differently: Take 20 mg by mouth daily. ) 180 tablet 1 06/16/2016 at Unknown time  . losartan (COZAAR) 100 MG tablet Take 1 tablet daily for BP (Patient taking differently: Take 100 mg by mouth daily. ) 90 tablet 1 06/16/2016 at Unknown time  . Magnesium Oxide 250 MG TABS Take 1 tablet by mouth daily as needed (constipation).    06/15/2016 at Unknown time  . metoprolol (LOPRESSOR) 50 MG tablet TAKE ONE TABLET BY MOUTH TWICE DAILY (Patient taking differently: TAKE 50mg  TABLET BY MOUTH TWICE DAILY) 180 tablet 1 06/16/2016 at 1000  . minoxidil (LONITEN) 10 MG tablet Take 1/2 to 1 tablet daily as directed  for BP (Patient taking differently: Take 10 mg by mouth daily. ) 90 tablet 1 06/16/2016 at Unknown time  . mometasone (NASONEX) 50 MCG/ACT nasal spray Place 2 sprays into the nose daily. (Patient taking differently: Place 2 sprays into the nose 2 (two) times daily. ) 17 g 2 06/16/2016 at Unknown time  . PROAIR HFA 108 (90 Base) MCG/ACT inhaler Inhale 1-2 puffs into the lungs every 4 (four) hours as needed for shortness of breath.    Not Taking at Unknown time   Scheduled:  . aspirin EC  81 mg Oral Daily  . atorvastatin  40 mg Oral QPM  . chlorhexidine  15 mL Mouth Rinse BID  . cholecalciferol  5,000 Units Oral Daily  . fluticasone  1 spray Each Nare Daily  . iopamidol      . iopamidol      . mouth rinse  15 mL Mouth Rinse q12n4p  . metoprolol  50 mg Oral BID  . piperacillin-tazobactam (ZOSYN)  IV  3.375 g Intravenous Q8H  . vancomycin  1,250 mg Intravenous Q12H   Infusions:  . sodium chloride 100 mL/hr at 06/16/16 2343  . heparin 1,700 Units/hr (06/16/16 2349)    Assessment: 65 y.o. female with a past medical history significant for hypertension, CHF, diabetes, history of DVT/ PE status post IVC filter and recent diagnosis of recurrent PE (CTA on 12/13 with saddle PE with right heart strain)  currently on Eliquis status post hospitalization last week who presents with fevers, chills, cough, and shortness of breath. Admitting for sepsis d/t pneumonia. Pharmacy to dose heparin while off Eliquis.  Prior anticoagulation: Was supposed to be taking Eliquis 10 mg bid until 12/24, but reports taking 5 mg QID during this phase of tx. Only dose today taken at 10am  Significant events:  Today, 06/17/2016: - heparin level is supra-therapeutic at >2.20 but this is likely from residual effect of Eliquis.  APTT is a better measure of heparin at this time while while Eliquis is still in her system -- level is supra-therapeutic at 1.75 (level drawn on correct arm) -  hgb down 10.4 - no bleeding  documented  Goal of Therapy: Heparin level 0.3-0.7 units/ml; aPTT 66-102 secs Monitor platelets by anticoagulation protocol: Yes  Plan: - hold heparin drip for 1 hour, then resume at 1400 units/hr - recheck another 6 hr heparin level and aPTT after start of new rate -  f/u plan for resuming home Eliquis - monitor for s/s bleeding  ______________________  Adden (1610): to changed AC back to Eliquis.  Will d/c heparin, then start eliquis 10 mg bid x2 days (since this was started on 06/12/16), then 5 mg bid.  Will start rocephin 1 gm IV q24h for UTI.  Pharmacy will sign off for rocephin.  Dorna Leitz, PharmD, BCPS 06/17/2016 7:44 AM

## 2016-06-17 NOTE — Progress Notes (Signed)
CRITICAL VALUE ALERT  Critical value received:  PTT 175  Date of notification:  06/17/16  Time of notification:  0720  Critical value read back:Yes.    Nurse who received alert:  Santiago GladAllison Loreda Silverio, RN  MD notified (1st page): Dr. Malachi BondsShort    Time of first page:  [phone]  Responding MD:  Dr. Malachi BondsShort  Time MD responded:  [phone]

## 2016-06-17 NOTE — Progress Notes (Signed)
PROGRESS NOTE  Kerry Liu  WUJ:[id number] DOB: 09/29/1950 DOA: 06/16/2016 PCP: Nadean CorwinMCKEOWN,WILLIAM DAVID, MD  Brief Narrative:   The patient is a 65 year old female with history of PE/DVT who is admitted for pulmonary embolism and discharged 4 days prior to admission. She presented with worsening shortness of breath and fast heartbeat. She had followed up with her primary care doctor and describe cough and shortness of breath and was prescribed antibiotics. Despite antibiotics, she developed fever and chills and was found to be tachycardic and hypoxic by her physical therapist. She had a fever to 101 Fahrenheit at home. Her heart rate was in the 120s-130s. Urinalysis was consistent with urinary tract infection and chest x-ray was unremarkable. She was admitted for sepsis secondary to urinary tract infection which was present at the time of admission. Repeat CT angiogram chest demonstrated no acute pulmonary embolism and verified her old PE with stable to marginally improved. She reported compliance with her anticoagulation.  Assessment & Plan:   Principal Problem:   SIRS (systemic inflammatory response syndrome) (HCC) Active Problems:   Essential hypertension   Recurrent pulmonary emboli (HCC)   OSA on CPAP   Elevated troponin   Acute respiratory failure with hypoxemia (HCC)   Sirs positive with acute respiratory failure with hypoxia, likely due to urinary tract infection. Her pro-calcitonin level was low consistent with her stable chest x-ray and no evidence of infiltrate on her CT chest. She was initially placed on vancomycin and Zosyn, however due to low suspicion for pulmonary etiology, I have narrowed her antibiotics to ceftriaxone for UTI. -  ceftriaxone -  Follow-up urine culture -  Blood pressure stable, transfer to telemetry - Ambulatory pulse ox  Generalized weakness -  PT evaluation -  DME 3-in-1  Pulmonary embolism -  Discontinue heparin drip -  Resume Apixaban -   Echocardiogram during recent admission demonstrated an ejection fraction of 65-70 percent with grade 1 diastolic dysfunction, and normal right ventricle size, wall thickness and systolic function  Hypertension with low normal blood pressures, continue to hold antihypertensives  OSA, stable on CPAP  Morbid obesity, her weights have gradually been increasing over the last several years.    DVT prophylaxis:  Apixaban Code Status:  Full code Family Communication:  Patient alone Disposition Plan:  Likely home tomorrow   Consultants:   None  Procedures:  None  Antimicrobials:  Anti-infectives    Start     Dose/Rate Route Frequency Ordered Stop   06/17/16 2100  cefTRIAXone (ROCEPHIN) 1 g in dextrose 5 % 50 mL IVPB     1 g 100 mL/hr over 30 Minutes Intravenous Every 24 hours 06/17/16 0852     06/17/16 1000  vancomycin (VANCOCIN) 1,250 mg in sodium chloride 0.9 % 250 mL IVPB  Status:  Discontinued     1,250 mg 166.7 mL/hr over 90 Minutes Intravenous Every 12 hours 06/16/16 2234 06/17/16 0841   06/17/16 0600  piperacillin-tazobactam (ZOSYN) IVPB 3.375 g  Status:  Discontinued     3.375 g 12.5 mL/hr over 240 Minutes Intravenous Every 8 hours 06/16/16 2234 06/17/16 0841   06/17/16 0000  vancomycin (VANCOCIN) 1,500 mg in sodium chloride 0.9 % 500 mL IVPB     1,500 mg 250 mL/hr over 120 Minutes Intravenous  Once 06/16/16 2234 06/17/16 0317   06/16/16 2200  piperacillin-tazobactam (ZOSYN) IVPB 3.375 g     3.375 g 100 mL/hr over 30 Minutes Intravenous  Once 06/16/16 2155 06/17/16 0025   06/16/16 2200  vancomycin (  VANCOCIN) IVPB 1000 mg/200 mL premix     1,000 mg 200 mL/hr over 60 Minutes Intravenous  Once 06/16/16 2155 06/17/16 0114   06/16/16 2100  cefTRIAXone (ROCEPHIN) 1 g in dextrose 5 % 50 mL IVPB     1 g 100 mL/hr over 30 Minutes Intravenous  Once 06/16/16 2055 06/16/16 2158       Subjective: States that she is still having some fevers and chills and feels a little wheezy. She  had some difficulty getting her stream started prior to coming to the hospital but denied dysuria. Requesting a bedside commode.  Objective: Vitals:   06/17/16 0831 06/17/16 0900 06/17/16 1000 06/17/16 1244  BP: (!) 116/53  113/73 124/64  Pulse: 80 84 85 71  Resp: (!) 24 (!) 21 19 18   Temp:    98.1 F (36.7 C)  TempSrc:    Oral  SpO2: 95% 96% 94% 97%  Weight:      Height:        Intake/Output Summary (Last 24 hours) at 06/17/16 1358 Last data filed at 06/17/16 0900  Gross per 24 hour  Intake          2189.45 ml  Output              850 ml  Net          1339.45 ml   Filed Weights   06/16/16 1704 06/17/16 0500  Weight: (!) 145.2 kg (320 lb) (!) 154.2 kg (340 lb)    Examination:  General exam:  Morbidly obese adult female.  No acute distress.  HEENT:  NCAT, MMM Respiratory system: Wheezes, no focal rales, rhonchi Cardiovascular system: Regular rate and rhythm, normal S1/S2. No murmurs, rubs, gallops or clicks.  Warm extremities Gastrointestinal system: Normal active bowel sounds, soft, nondistended, nontender. MSK:  Normal tone and bulk, no lower extremity edema Neuro:  Grossly intact    Data Reviewed: I have personally reviewed following labs and imaging studies  CBC:  Recent Labs Lab 06/11/16 0520 06/12/16 0548 06/15/16 1702 06/16/16 1712 06/17/16 0357  WBC 7.6 7.0 10.2 11.0* 10.5  NEUTROABS  --   --  8,160* 8.1* 7.3  HGB 10.8* 11.1* 12.9 12.5 10.7*  HCT 34.0* 34.6* 40.5 38.7 33.1*  MCV 87.4 89.2 88.4 89.2 87.6  PLT 217 224 329 232 249   Basic Metabolic Panel:  Recent Labs Lab 06/15/16 1702 06/16/16 1712 06/17/16 0357  NA 141 139 140  K 4.4 4.2 4.2  CL 104 104 109  CO2 27 26 26   GLUCOSE 137* 154* 156*  BUN 15 17 15   CREATININE 0.98 1.18* 1.12*  CALCIUM 9.1 9.0 8.1*   GFR: Estimated Creatinine Clearance: 74.4 mL/min (by C-G formula based on SCr of 1.12 mg/dL (H)). Liver Function Tests:  Recent Labs Lab 06/15/16 1702 06/16/16 1712  06/17/16 0357  AST 22 33 36  ALT 42* 44 47  ALKPHOS 88 82 72  BILITOT 0.7 1.7* 1.2  PROT 6.7 7.1 6.0*  ALBUMIN 3.7 3.6 2.8*   No results for input(s): LIPASE, AMYLASE in the last 168 hours. No results for input(s): AMMONIA in the last 168 hours. Coagulation Profile:  Recent Labs Lab 06/16/16 1712 06/16/16 2324  INR 1.62 1.51   Cardiac Enzymes:  Recent Labs Lab 06/16/16 1712 06/16/16 2324 06/17/16 0357  TROPONINI 0.05* 0.05* 0.05*   BNP (last 3 results) No results for input(s): PROBNP in the last 8760 hours. HbA1C: No results for input(s): HGBA1C in the last 72 hours.  CBG:  Recent Labs Lab 06/11/16 0734 06/12/16 0727  GLUCAP 186* 111*   Lipid Profile: No results for input(s): CHOL, HDL, LDLCALC, TRIG, CHOLHDL, LDLDIRECT in the last 72 hours. Thyroid Function Tests:  Recent Labs  06/16/16 2324  TSH 3.966   Anemia Panel: No results for input(s): VITAMINB12, FOLATE, FERRITIN, TIBC, IRON, RETICCTPCT in the last 72 hours. Urine analysis:    Component Value Date/Time   COLORURINE YELLOW 06/16/2016 1702   APPEARANCEUR CLEAR 06/16/2016 1702   LABSPEC 1.017 06/16/2016 1702   PHURINE 5.0 06/16/2016 1702   GLUCOSEU NEGATIVE 06/16/2016 1702   HGBUR MODERATE (A) 06/16/2016 1702   BILIRUBINUR NEGATIVE 06/16/2016 1702   KETONESUR NEGATIVE 06/16/2016 1702   PROTEINUR NEGATIVE 06/16/2016 1702   NITRITE POSITIVE (A) 06/16/2016 1702   LEUKOCYTESUR SMALL (A) 06/16/2016 1702   Sepsis Labs: @LABRCNTIP (procalcitonin:4,lacticidven:4)  ) Recent Results (from the past 240 hour(s))  MRSA PCR Screening     Status: None   Collection Time: 06/08/16  3:50 PM  Result Value Ref Range Status   MRSA by PCR NEGATIVE NEGATIVE Final    Comment:        The GeneXpert MRSA Assay (FDA approved for NASAL specimens only), is one component of a comprehensive MRSA colonization surveillance program. It is not intended to diagnose MRSA infection nor to guide or monitor treatment  for MRSA infections.   Culture, blood (Routine x 2)     Status: None (Preliminary result)   Collection Time: 06/16/16  5:12 PM  Result Value Ref Range Status   Specimen Description BLOOD RIGHT HAND  Final   Special Requests BOTTLES DRAWN AEROBIC AND ANAEROBIC 5CC  Final   Culture   Final    NO GROWTH < 24 HOURS [facility]    Report Status PENDING  Incomplete  Culture, blood (Routine x 2)     Status: None (Preliminary result)   Collection Time: 06/16/16  5:12 PM  Result Value Ref Range Status   Specimen Description BLOOD LEFT ARM  Final   Special Requests IN PEDIATRIC BOTTLE 2CC  Final   Culture   Final    NO GROWTH < 24 HOURS [facility]    Report Status PENDING  Incomplete      Radiology Studies: Dg Chest 2 View  Result Date: 06/16/2016 CLINICAL DATA:  Recent pulmonary embolism. Dyspnea and nonproductive cough today. EXAM: CHEST  2 VIEW COMPARISON:  06/08/2016 FINDINGS: Low lung volumes. No confluent consolidation. No effusions. Unchanged cardiomegaly. Pulmonary vasculature is normal. Hilar and mediastinal contours are unremarkable and unchanged. IMPRESSION: Cardiomegaly.  Low lung volumes.  No consolidation or effusion. Electronically Signed   By: Ellery Plunkaniel R Mitchell M.D.   On: 06/16/2016 18:16   Ct Angio Chest Pe W Or Wo Contrast  Result Date: 06/16/2016 CLINICAL DATA:  Shortness of breath dry cough and fever EXAM: CT ANGIOGRAPHY CHEST WITH CONTRAST TECHNIQUE: Multidetector CT imaging of the chest was performed using the standard protocol during bolus administration of intravenous contrast. Multiplanar CT image reconstructions and MIPs were obtained to evaluate the vascular anatomy. CONTRAST:  125 mL Isovue 370 intravenous COMPARISON:  Chest x-ray 06/16/2016, CT chest 06/08/2016, 11/21/2012 FINDINGS: Cardiovascular: Examination is limited by patient's large body habitus and respiratory motion artifact. Contrast bolus is sub optimal for  evaluation of lower order pulmonary artery branches. Main pulmonary artery trunk is enlarged, measuring 3.6 cm. Previously noted saddle embolus is not identified. Emboli within the distal right pulmonary artery are not clearly  identified on today's study. There is residual nonocclusive thrombus within the distal left pulmonary artery that extends into segmental and subsegmental branches of left lower lobe pulmonary arteries. There is no new thrombus. Overall thrombus appears slightly decreased with slightly improved enhancement of subsegmental lower lobe pulmonary artery's on the left side. RV LV ratio remains elevated at 1.1, previously 1.2. Thoracic aorta is non aneurysmal. No dissection. There is mild cardiomegaly. No pericardial effusion. Atherosclerosis is present. Mediastinum/Nodes: No grossly enlarged mediastinal or hilar nodes. Trachea is unremarkable. Image thyroid within normal limits. Esophagus is within normal limits. Lungs/Pleura: Stable 4 mm nodule right middle lobe, series 11, image number 37. No acute consolidation or effusion. No pneumothorax. Upper Abdomen: No acute abnormality. Musculoskeletal: Degenerative changes of the spine. No acute abnormality. Review of the MIP images confirms the above findings. IMPRESSION: 1. The examination is compromised by patient's large body habitus and excessive respiratory motion artifact as well as suboptimal contrast bolus. The previously noted saddle embolus has resolved and the previously noted right-sided pulmonary emboli are not clearly identified on the current exam. There is residual but smaller thrombus within the distal left main pulmonary artery with extension into segmental and subsegmental branches of left lower lobe pulmonary arteries, however there is improved flow within the lower order pulmonary arterial branches on the left. There is no new thrombus visualized. RV LV ratio remains elevated at 1.1 consistent with right heart strain, but improved  compared with 1.2 previously. Please note that evaluation of the RV LV ratio was difficult due to respiratory motion artifact. 2. Lung fields show no acute infiltrate or effusion. Electronically Signed   By: Jasmine Pang M.D.   On: 06/16/2016 23:09     Scheduled Meds: . apixaban  10 mg Oral BID   Followed by  . [START ON 06/19/2016] apixaban  5 mg Oral BID  . aspirin EC  81 mg Oral Daily  . atorvastatin  40 mg Oral QPM  . cefTRIAXone (ROCEPHIN)  IV  1 g Intravenous Q24H  . chlorhexidine  15 mL Mouth Rinse BID  . cholecalciferol  5,000 Units Oral Daily  . fluticasone  1 spray Each Nare Daily  . mouth rinse  15 mL Mouth Rinse q12n4p  . metoprolol  50 mg Oral BID   Continuous Infusions:   LOS: 1 day    Time spent: 30 min    Renae Fickle, MD [facility] Pager [phone]  If 7PM-7AM, please contact night-coverage [email or web address] Password [email or web address] 06/17/2016, 1:58 PM

## 2016-06-17 NOTE — Progress Notes (Signed)
RT placed patient on CPAP. Patient setting is 5-20 auto. Sterile water added to water chamber for humidification. RT will continue to monitor.

## 2016-06-18 DIAGNOSIS — N3000 Acute cystitis without hematuria: Secondary | ICD-10-CM

## 2016-06-18 LAB — BASIC METABOLIC PANEL
Anion gap: 6 (ref 5–15)
BUN: 19 mg/dL (ref 6–20)
CALCIUM: 8.7 mg/dL — AB (ref 8.9–10.3)
CO2: 26 mmol/L (ref 22–32)
CREATININE: 0.95 mg/dL (ref 0.44–1.00)
Chloride: 109 mmol/L (ref 101–111)
GFR calc Af Amer: >60 mL/min (ref >60)
GLUCOSE: 112 mg/dL — AB (ref 65–99)
Potassium: 4.1 mmol/L (ref 3.5–5.1)
SODIUM: 141 mmol/L (ref 135–145)

## 2016-06-18 LAB — CBC
HEMATOCRIT: 32.6 % — AB (ref 36.0–46.0)
HEMOGLOBIN: 10 g/dL — AB (ref 12.0–15.0)
MCH: 27 pg (ref 26.0–34.0)
MCHC: 30.7 g/dL (ref 30.0–36.0)
MCV: 88.1 fL (ref 78.0–100.0)
Platelets: 263 10*3/uL (ref 150–400)
RBC: 3.7 MIL/uL — AB (ref 3.87–5.11)
RDW: 12.9 % (ref 11.5–15.5)
WBC: 8 10*3/uL (ref 4.0–10.5)

## 2016-06-18 NOTE — Progress Notes (Signed)
PROGRESS NOTE  Kerry Liu  MVH:[id number] DOB: 02/28/1951 DOA: 06/16/2016 PCP: Nadean CorwinMCKEOWN,WILLIAM DAVID, MD  Brief Narrative:   The patient is a 65 year old female with history of PE/DVT who is admitted for pulmonary embolism and discharged 4 days prior to admission. She presented with worsening shortness of breath and fast heartbeat. She had followed up with her primary care doctor and describe cough and shortness of breath and was prescribed antibiotics. Despite antibiotics, she developed fever and chills and was found to be tachycardic and hypoxic by her physical therapist. She had a fever to 101 Fahrenheit at home. Her heart rate was in the 120s-130s. Urinalysis was consistent with urinary tract infection and chest x-ray was unremarkable. She was admitted for sepsis secondary to urinary tract infection which was present at the time of admission. Repeat CT angiogram chest demonstrated no acute pulmonary embolism and verified her old PE with stable to marginally improved. She reported compliance with her anticoagulation.  Assessment & Plan:   Principal Problem:   SIRS (systemic inflammatory response syndrome) (HCC) Active Problems:   Essential hypertension   Recurrent pulmonary emboli (HCC)   OSA on CPAP   Elevated troponin   Acute respiratory failure with hypoxemia (HCC)   SIRS positive with acute respiratory failure with hypoxia, likely due to Escherichia coli urinary tract infection present at time of admission.  -  Continue ceftriaxone -  Follow-up urine culture sensitivities -  Blood cultures no growth to date  Generalized weakness -  PT evaluation:  Home health PT/OT -  DME 3-in-1, Bariatric 4 wheeled rolling walker  Diarrhea, frequent loose stools (5 recorded) and patient currently on abx without recent laxatives or stool softeners.  May be side effect of antibiotics -  If worsens, send for C. Diff  Pulmonary embolism -  Continue Apixaban -  Echocardiogram during recent  admission demonstrated an ejection fraction of 65-70 percent with grade 1 diastolic dysfunction, and normal right ventricle size, wall thickness and systolic function  Hypertension with low normal blood pressures, continue to hold antihypertensives  OSA, stable on CPAP  Morbid obesity, her weights have gradually been increasing over the last several years.    DVT prophylaxis:  Apixaban Code Status:  Full code Family Communication:  Patient alone Disposition Plan:  Home tomorrow with home health services  Consultants:   None  Procedures:  None  Antimicrobials:  Anti-infectives    Start     Dose/Rate Route Frequency Ordered Stop   06/17/16 2100  cefTRIAXone (ROCEPHIN) 1 g in dextrose 5 % 50 mL IVPB     1 g 100 mL/hr over 30 Minutes Intravenous Every 24 hours 06/17/16 0852     06/17/16 1000  vancomycin (VANCOCIN) 1,250 mg in sodium chloride 0.9 % 250 mL IVPB  Status:  Discontinued     1,250 mg 166.7 mL/hr over 90 Minutes Intravenous Every 12 hours 06/16/16 2234 06/17/16 0841   06/17/16 0600  piperacillin-tazobactam (ZOSYN) IVPB 3.375 g  Status:  Discontinued     3.375 g 12.5 mL/hr over 240 Minutes Intravenous Every 8 hours 06/16/16 2234 06/17/16 0841   06/17/16 0000  vancomycin (VANCOCIN) 1,500 mg in sodium chloride 0.9 % 500 mL IVPB     1,500 mg 250 mL/hr over 120 Minutes Intravenous  Once 06/16/16 2234 06/17/16 0317   06/16/16 2200  piperacillin-tazobactam (ZOSYN) IVPB 3.375 g     3.375 g 100 mL/hr over 30 Minutes Intravenous  Once 06/16/16 2155 06/17/16 0025   06/16/16 2200  vancomycin (  VANCOCIN) IVPB 1000 mg/200 mL premix     1,000 mg 200 mL/hr over 60 Minutes Intravenous  Once 06/16/16 2155 06/17/16 0114   06/16/16 2100  cefTRIAXone (ROCEPHIN) 1 g in dextrose 5 % 50 mL IVPB     1 g 100 mL/hr over 30 Minutes Intravenous  Once 06/16/16 2055 06/16/16 2158       Subjective: States that she is having frequent watery stools with stool incontinence since yesterday.   Breathing is better and she tolerated her CPAP last night.  Wants to make sure she is feeling better before she is discharged because she does not want to have to come back to the hospital.    Objective: Vitals:   06/17/16 1244 06/17/16 2120 06/18/16 0535 06/18/16 1329  BP: 124/64 118/61 (!) 107/50 126/79  Pulse: 71 79 70 65  Resp: 18 18 18 18   Temp: 98.1 F (36.7 C) 99.2 F (37.3 C) 98 F (36.7 C) 98.9 F (37.2 C)  TempSrc: Oral Oral Oral Oral  SpO2: 97% 98% 94% 97%  Weight:      Height:        Intake/Output Summary (Last 24 hours) at 06/18/16 1407 Last data filed at 06/18/16 0200  Gross per 24 hour  Intake              290 ml  Output              120 ml  Net              170 ml   Filed Weights   06/16/16 1704 06/17/16 0500  Weight: (!) 145.2 kg (320 lb) (!) 154.2 kg (340 lb)    Examination:  General exam:  Morbidly obese adult female.  No acute distress.  HEENT:  NCAT, MMM Respiratory system: Wheezes, no focal rales, rhonchi Cardiovascular system: Regular rate and rhythm, normal S1/S2. No murmurs, rubs, gallops or clicks.  Warm extremities Gastrointestinal system: Normal active bowel sounds, soft, nondistended, nontender. MSK:  Normal tone and bulk, trace bilateral lower extremity edema Neuro:  Grossly intact    Data Reviewed: I have personally reviewed following labs and imaging studies  CBC:  Recent Labs Lab 06/12/16 0548 06/15/16 1702 06/16/16 1712 06/17/16 0357 06/18/16 0548  WBC 7.0 10.2 11.0* 10.5 8.0  NEUTROABS  --  8,160* 8.1* 7.3  --   HGB 11.1* 12.9 12.5 10.7* 10.0*  HCT 34.6* 40.5 38.7 33.1* 32.6*  MCV 89.2 88.4 89.2 87.6 88.1  PLT 224 329 232 249 263   Basic Metabolic Panel:  Recent Labs Lab 06/15/16 1702 06/16/16 1712 06/17/16 0357 06/18/16 0548  NA 141 139 140 141  K 4.4 4.2 4.2 4.1  CL 104 104 109 109  CO2 27 26 26 26   GLUCOSE 137* 154* 156* 112*  BUN 15 17 15 19   CREATININE 0.98 1.18* 1.12* 0.95  CALCIUM 9.1 9.0 8.1* 8.7*     GFR: Estimated Creatinine Clearance: 87.7 mL/min (by C-G formula based on SCr of 0.95 mg/dL). Liver Function Tests:  Recent Labs Lab 06/15/16 1702 06/16/16 1712 06/17/16 0357  AST 22 33 36  ALT 42* 44 47  ALKPHOS 88 82 72  BILITOT 0.7 1.7* 1.2  PROT 6.7 7.1 6.0*  ALBUMIN 3.7 3.6 2.8*   No results for input(s): LIPASE, AMYLASE in the last 168 hours. No results for input(s): AMMONIA in the last 168 hours. Coagulation Profile:  Recent Labs Lab 06/16/16 1712 06/16/16 2324  INR 1.62 1.51   Cardiac Enzymes:  Recent Labs Lab 06/16/16 1712 06/16/16 2324 06/17/16 0357  TROPONINI 0.05* 0.05* 0.05*   BNP (last 3 results) No results for input(s): PROBNP in the last 8760 hours. HbA1C: No results for input(s): HGBA1C in the last 72 hours. CBG:  Recent Labs Lab 06/12/16 0727  GLUCAP 111*   Lipid Profile: No results for input(s): CHOL, HDL, LDLCALC, TRIG, CHOLHDL, LDLDIRECT in the last 72 hours. Thyroid Function Tests:  Recent Labs  06/16/16 2324  TSH 3.966   Anemia Panel: No results for input(s): VITAMINB12, FOLATE, FERRITIN, TIBC, IRON, RETICCTPCT in the last 72 hours. Urine analysis:    Component Value Date/Time   COLORURINE YELLOW 06/16/2016 1702   APPEARANCEUR CLEAR 06/16/2016 1702   LABSPEC 1.017 06/16/2016 1702   PHURINE 5.0 06/16/2016 1702   GLUCOSEU NEGATIVE 06/16/2016 1702   HGBUR MODERATE (A) 06/16/2016 1702   BILIRUBINUR NEGATIVE 06/16/2016 1702   KETONESUR NEGATIVE 06/16/2016 1702   PROTEINUR NEGATIVE 06/16/2016 1702   NITRITE POSITIVE (A) 06/16/2016 1702   LEUKOCYTESUR SMALL (A) 06/16/2016 1702   Sepsis Labs: @LABRCNTIP (procalcitonin:4,lacticidven:4)  ) Recent Results (from the past 240 hour(s))  MRSA PCR Screening     Status: None   Collection Time: 06/08/16  3:50 PM  Result Value Ref Range Status   MRSA by PCR NEGATIVE NEGATIVE Final    Comment:        The GeneXpert MRSA Assay (FDA approved for NASAL specimens only), is one  component of a comprehensive MRSA colonization surveillance program. It is not intended to diagnose MRSA infection nor to guide or monitor treatment for MRSA infections.   Urine culture     Status: Abnormal (Preliminary result)   Collection Time: 06/16/16  5:02 PM  Result Value Ref Range Status   Specimen Description URINE, RANDOM  Final   Special Requests NONE  Final   Culture (A)  Final    >=100,000 COLONIES/mL ESCHERICHIA COLI SUSCEPTIBILITIES TO FOLLOW Performed at [facility]    Report Status PENDING  Incomplete  Culture, blood (Routine x 2)     Status: None (Preliminary result)   Collection Time: 06/16/16  5:12 PM  Result Value Ref Range Status   Specimen Description BLOOD RIGHT HAND  Final   Special Requests BOTTLES DRAWN AEROBIC AND ANAEROBIC 5CC  Final   Culture   Final    NO GROWTH 2 DAYS Performed at [facility]    Report Status PENDING  Incomplete  Culture, blood (Routine x 2)     Status: None (Preliminary result)   Collection Time: 06/16/16  5:12 PM  Result Value Ref Range Status   Specimen Description BLOOD LEFT ARM  Final   Special Requests IN PEDIATRIC BOTTLE 2CC  Final   Culture   Final    NO GROWTH 2 DAYS Performed at [facility]    Report Status PENDING  Incomplete      Radiology Studies: Dg Chest 2 View  Result Date: 06/16/2016 CLINICAL DATA:  Recent pulmonary embolism. Dyspnea and nonproductive cough today. EXAM: CHEST  2 VIEW COMPARISON:  06/08/2016 FINDINGS: Low lung volumes. No confluent consolidation. No effusions. Unchanged cardiomegaly. Pulmonary vasculature is normal. Hilar and mediastinal contours are unremarkable and unchanged. IMPRESSION: Cardiomegaly.  Low lung volumes.  No consolidation or effusion. Electronically Signed   By: Ellery Plunk M.D.   On: 06/16/2016 18:16   Ct Angio Chest Pe W Or Wo Contrast  Result Date: 06/16/2016 CLINICAL DATA:  Shortness of breath dry cough and fever EXAM: CT ANGIOGRAPHY  CHEST WITH CONTRAST TECHNIQUE: Multidetector CT imaging of the chest was performed using the standard protocol during bolus administration of intravenous contrast. Multiplanar CT image reconstructions and MIPs were obtained to evaluate the vascular anatomy. CONTRAST:  125 mL Isovue 370 intravenous COMPARISON:  Chest x-ray 06/16/2016, CT chest 06/08/2016, 11/21/2012 FINDINGS: Cardiovascular: Examination is limited by patient's large body habitus and respiratory motion artifact. Contrast bolus is sub optimal for evaluation of lower order pulmonary artery branches. Main pulmonary artery trunk is enlarged, measuring 3.6 cm. Previously noted saddle embolus is not identified. Emboli within the distal right pulmonary artery are not clearly identified on today's study. There is residual nonocclusive thrombus within the distal left pulmonary artery that extends into segmental and subsegmental branches of left lower lobe pulmonary arteries. There is no new thrombus. Overall thrombus appears slightly decreased with slightly improved enhancement of subsegmental lower lobe pulmonary artery's on the left side. RV LV ratio remains elevated at 1.1, previously 1.2. Thoracic aorta is non aneurysmal. No dissection. There is mild cardiomegaly. No pericardial effusion. Atherosclerosis is present. Mediastinum/Nodes: No grossly enlarged mediastinal or hilar nodes. Trachea is unremarkable. Image thyroid within normal limits. Esophagus is within normal limits. Lungs/Pleura: Stable 4 mm nodule right middle lobe, series 11, image number 37. No acute consolidation or effusion. No pneumothorax. Upper Abdomen: No acute abnormality. Musculoskeletal: Degenerative changes of the spine. No acute abnormality. Review of the MIP images confirms the above findings. IMPRESSION: 1. The examination is compromised by patient's large body habitus and excessive respiratory motion artifact as well as suboptimal contrast bolus. The previously noted saddle  embolus has resolved and the previously noted right-sided pulmonary emboli are not clearly identified on the current exam. There is residual but smaller thrombus within the distal left main pulmonary artery with extension into segmental and subsegmental branches of left lower lobe pulmonary arteries, however there is improved flow within the lower order pulmonary arterial branches on the left. There is no new thrombus visualized. RV LV ratio remains elevated at 1.1 consistent with right heart strain, but improved compared with 1.2 previously. Please note that evaluation of the RV LV ratio was difficult due to respiratory motion artifact. 2. Lung fields show no acute infiltrate or effusion. Electronically Signed   By: Jasmine PangKim  Fujinaga M.D.   On: 06/16/2016 23:09     Scheduled Meds: . apixaban  10 mg Oral BID   Followed by  . [START ON 06/19/2016] apixaban  5 mg Oral BID  . aspirin EC  81 mg Oral Daily  . atorvastatin  40 mg Oral QPM  . cefTRIAXone (ROCEPHIN)  IV  1 g Intravenous Q24H  . chlorhexidine  15 mL Mouth Rinse BID  . cholecalciferol  5,000 Units Oral Daily  . fluticasone  1 spray Each Nare Daily  . mouth rinse  15 mL Mouth Rinse q12n4p  . metoprolol  50 mg Oral BID   Continuous Infusions:   LOS: 2 days    Time spent: 30 min    Renae FickleSHORT, Montell Leopard, MD [facility] Pager [phone]  If 7PM-7AM, please contact night-coverage [email or web address] Password [email or web address] 06/18/2016, 2:07 PM

## 2016-06-18 NOTE — Evaluation (Signed)
Physical Therapy Evaluation Patient Details Name: Kerry Liu MRN: [id number] DOB: 12/07/1950 Today's Date: 06/18/2016   History of Present Illness  65 yo female admitted with SIRS. Hx of PE, DVT, RBBB, morbid obesity, IVC filter  Clinical Impression  On eval, pt required Min guard assist for standing and ambulation with 4 wheeled rolling walker. She was able to walk ~40 feet before requiring a seated rest break. O2 sats 97% on RA, dyspnea 2/4 during ambulation. Will follow and progress activity as tolerated. Discussed d/c plan-pt states she needs aide assistance in home for ADLs and meals. She is unable to perform these tasks without assistance. She states that if she cannot get in home assistance, she is unsure if she can manage at home alone.     Follow Up Recommendations Home health PT;Supervision - Intermittent vs SNF (Home health aide for assistance with ADLs; HHOT. If pt cannot get assistance in home, may need to consider SNF)    Equipment Recommendations   (bariatric 4 wheeled rolling walker-to allow for seated rest breaks); 3n1    Recommendations for Other Services OT consult     Precautions / Restrictions Precautions Precautions: Fall Restrictions Weight Bearing Restrictions: No      Mobility  Bed Mobility               General bed mobility comments: oob sitting on bsc  Transfers Overall transfer level: Needs assistance Equipment used: 4-wheeled walker Transfers: Sit to/from Stand Sit to Stand: Min guard         General transfer comment: close guard for safety. VCs safety, hand placement, proper operation of rollator brakes.   Ambulation/Gait Ambulation/Gait assistance: Min guard Ambulation Distance (Feet): 40 Feet (x2) Assistive device: 4-wheeled walker       General Gait Details: close guard for safety. dyspnea 2/4. O2 sats 97% on RA  Stairs            Wheelchair Mobility    Modified Rankin (Stroke Patients Only)       Balance  Overall balance assessment: Needs assistance           Standing balance-Leahy Scale: Poor Standing balance comment: requires RW                             Pertinent Vitals/Pain Pain Assessment: No/denies pain    Home Living Family/patient expects to be discharged to:: Private residence Living Arrangements: Alone Available Help at Discharge: Family (grandson in and out) Type of Home: House Home Access: Level entry     Home Layout: One level Home Equipment: None Additional Comments: would like a BSC and a rollator    Prior Function                 Hand Dominance        Extremity/Trunk Assessment   Upper Extremity Assessment Upper Extremity Assessment: Overall WFL for tasks assessed    Lower Extremity Assessment Lower Extremity Assessment: Generalized weakness    Cervical / Trunk Assessment Cervical / Trunk Assessment: Normal  Communication   Communication: No difficulties  Cognition Arousal/Alertness: Awake/alert Behavior During Therapy: WFL for tasks assessed/performed Overall Cognitive Status: Within Functional Limits for tasks assessed                      General Comments      Exercises     Assessment/Plan    PT Assessment Patient needs continued PT services  PT Problem List Decreased mobility;Decreased balance;Decreased knowledge of use of DME;Decreased activity tolerance          PT Treatment Interventions Gait training;DME instruction;Therapeutic activities;Therapeutic exercise;Patient/family education;Functional mobility training;Balance training    PT Goals (Current goals can be found in the Care Plan section)  Acute Rehab PT Goals Patient Stated Goal: pt reports she needs aide assistance in home for ADLS, meals PT Goal Formulation: With patient Time For Goal Achievement: 07/02/16 Potential to Achieve Goals: Fair    Frequency Min 3X/week   Barriers to discharge        Co-evaluation                End of Session   Activity Tolerance: Patient tolerated treatment well Patient left: in chair;with call bell/phone within reach           Time: 1053-1125 PT Time Calculation (min) (ACUTE ONLY): 32 min   Charges:   PT Evaluation $PT Eval Low Complexity: 1 Procedure PT Treatments $Gait Training: 8-22 mins   PT G Codes:        Rebeca AlertJannie Julieta Rogalski, MPT Pager: [phone]

## 2016-06-19 LAB — BASIC METABOLIC PANEL
ANION GAP: 6 (ref 5–15)
BUN: 19 mg/dL (ref 6–20)
CHLORIDE: 108 mmol/L (ref 101–111)
CO2: 27 mmol/L (ref 22–32)
Calcium: 8.7 mg/dL — ABNORMAL LOW (ref 8.9–10.3)
Creatinine, Ser: 0.96 mg/dL (ref 0.44–1.00)
GFR calc non Af Amer: >60 mL/min (ref >60)
Glucose, Bld: 107 mg/dL — ABNORMAL HIGH (ref 65–99)
POTASSIUM: 4.4 mmol/L (ref 3.5–5.1)
SODIUM: 141 mmol/L (ref 135–145)

## 2016-06-19 LAB — CBC
HEMATOCRIT: 33.2 % — AB (ref 36.0–46.0)
HEMOGLOBIN: 10.5 g/dL — AB (ref 12.0–15.0)
MCH: 28.5 pg (ref 26.0–34.0)
MCHC: 31.6 g/dL (ref 30.0–36.0)
MCV: 90 fL (ref 78.0–100.0)
Platelets: 308 10*3/uL (ref 150–400)
RBC: 3.69 MIL/uL — AB (ref 3.87–5.11)
RDW: 12.9 % (ref 11.5–15.5)
WBC: 7 10*3/uL (ref 4.0–10.5)

## 2016-06-19 LAB — URINE CULTURE: Culture: >=100000 — AB

## 2016-06-19 MED ORDER — AMLODIPINE BESYLATE 5 MG PO TABS: 5.0000 mg | ORAL_TABLET | Freq: Every day | ORAL | 0 refills | Status: DC

## 2016-06-19 MED ORDER — CEPHALEXIN 500 MG PO CAPS: 500.0000 mg | ORAL_CAPSULE | Freq: Two times a day (BID) | ORAL | 0 refills | Status: DC

## 2016-06-19 MED ORDER — ATORVASTATIN CALCIUM 80 MG PO TABS: 40.0000 mg | ORAL_TABLET | Freq: Every evening | ORAL | 0 refills | Status: DC

## 2016-06-19 MED ORDER — APIXABAN 5 MG PO TABS: 5.0000 mg | ORAL_TABLET | Freq: Two times a day (BID) | ORAL | 0 refills | Status: DC

## 2016-06-19 MED ORDER — FUROSEMIDE 40 MG PO TABS: 20.0000 mg | ORAL_TABLET | Freq: Every day | ORAL | 0 refills | Status: DC

## 2016-06-19 NOTE — Progress Notes (Signed)
Lab reports that BM is too formed to accept as a c-diff sample.

## 2016-06-19 NOTE — Care Management Note (Signed)
Case Management Note  Patient Details  Name: Kerry Liu MRN: [id number] Date of Birth: 05/29/1951  Subjective/Objective:                   pulmonary embolism Action/Plan: Discharge planning Expected Discharge Date:   (unknown)               Expected Discharge Plan:  Home w Home Health Services  In-House Referral:     Discharge planning Services  CM Consult  Post Acute Care Choice:  Home Health Choice offered to:  Patient  DME Arranged:  3-N-1, Walker rolling DME Agency:  [facility]  HH Arranged:  RN, PT, OT, Nurse's Aide HH Agency:  [facility]  Status of Service:  Completed, signed off  If discussed at Long Length of Stay Meetings, dates discussed:    Additional Comments: CM spoke with pt who chooses [facility] to render hHPT/OT/RN/Aide.  Cm notified [facility] DME rep, Reggie to please deliver the 3n1 and rollator to room.  No other CM needs were communicated. Yves DillJeffries, Jahmez Bily Christine, RN 06/19/2016, 3:41 PM

## 2016-06-19 NOTE — Discharge Summary (Signed)
Physician Discharge Summary  Kerry Liu S Gage ZOX:[id number] DOB: 06/15/1951 DOA: 06/16/2016  PCP: Kerry CorwinMCKEOWN,Kerry DAVID, MD  Admit date: 06/16/2016 Discharge date: 06/19/2016  Admitted From: Home  Disposition:  Home  Recommendations for Outpatient Follow-up:  1. Follow up with PCP in 1-2 weeks for blood pressure check to resume ARB and minoxidil if needed 2. Please obtain BMP/CBC in one week 3. Please follow up on the following pending results:  Blood cultures 4. Keflex to complete a 7-day course  Home Health:  PT, OT, aid, RN  Equipment/Devices:  Bariatric 4 wheeled rolling walker, bariatric 3 -in- 1  Discharge Condition:  Stable, improved CODE STATUS:  Full code  Diet recommendation:  Healthy heart   Brief/Interim Summary:  The patient is a 65 year old female with history of PE/DVT who is admitted for pulmonary embolism and discharged 4 days prior to admission. She presented with worsening shortness of breath and fast heartbeat. She had followed up with her primary care doctor and describe cough and shortness of breath and was prescribed antibiotics. Despite antibiotics, she developed fever and chills and was found to be tachycardic and hypoxic by her physical therapist.  She had a fever to 101 Fahrenheit at home. Her heart rate was in the 120s-130s. Urinalysis was consistent with urinary tract infection and chest x-ray was unremarkable. She was admitted for sepsis secondary to urinary tract infection (present at the time of admission). Repeat CT angiogram chest demonstrated no acute pulmonary embolism and verified her old PE with stable to marginally improved. She reported compliance with her anticoagulation.  Urine culture grew Escherichia coli which was sensitive to most antibiotics. She will be discharged on Keflex.  Discharge Diagnoses:  Principal Problem:   SIRS (systemic inflammatory response syndrome) (HCC) Active Problems:   Essential hypertension   Recurrent pulmonary emboli  (HCC)   OSA on CPAP   Elevated troponin   Acute respiratory failure with hypoxemia (HCC)  SIRS positive with acute respiratory failure with hypoxia, likely due to Escherichia coli urinary tract infection present at time of admission.  -   given ceftriaxone and transitioned to Keflex at discharge -  Blood cultures no growth to date  Generalized weakness -  PT evaluation:  Home health PT/OT -  DME 3-in-1, Bariatric 4 wheeled rolling walker  Frequent loose/soft but not watery stools, may be side effect of antibiotics.  Stools were too formed to send for C. difficile.  Pulmonary embolism, CT angiogram chest confirmed no acute PE. She continued Apixaban. -  Echocardiogram during recent admission demonstrated an ejection fraction of 65-70 percent with grade 1 diastolic dysfunction, and normal right ventricle size, wall thickness and systolic function  Hypertension initially had low normal blood pressures which rose during her hospitalization.  Recommended resuming beta blocker, norvasc, and lasix, but held losartan and minoxidil for now.  PCP to resume latter medications if needed in a few weeks.  OSA, stable on CPAP  Morbid obesity, her weights have gradually been increasing over the last several years.  She would benefit from keeping her weight the same or losing a little weight.    Discharge Instructions  Discharge Instructions    Call MD for:  difficulty breathing, headache or visual disturbances    Complete by:  As directed    Call MD for:  extreme fatigue    Complete by:  As directed    Call MD for:  hives    Complete by:  As directed    Call MD for:  persistant dizziness  or light-headedness    Complete by:  As directed    Call MD for:  persistant nausea and vomiting    Complete by:  As directed    Call MD for:  severe uncontrolled pain    Complete by:  As directed    Call MD for:  temperature >100.4    Complete by:  As directed    Diet - low sodium heart healthy     Complete by:  As directed    Increase activity slowly    Complete by:  As directed        Medication List    STOP taking these medications   aspirin EC 81 MG tablet   azithromycin 250 MG tablet Commonly known as:  ZITHROMAX   benzonatate 100 MG capsule Commonly known as:  TESSALON PERLES   losartan 100 MG tablet Commonly known as:  COZAAR   minoxidil 10 MG tablet Commonly known as:  LONITEN     TAKE these medications   amLODipine 5 MG tablet Commonly known as:  NORVASC Take 1 tablet (5 mg total) by mouth daily.   apixaban 5 MG Tabs tablet Commonly known as:  ELIQUIS Take 1 tablet (5 mg total) by mouth 2 (two) times daily. What changed:  how much to take  how to take this  when to take this  additional instructions   atorvastatin 80 MG tablet Commonly known as:  LIPITOR Take 0.5 tablets (40 mg total) by mouth every evening.   cephALEXin 500 MG capsule Commonly known as:  KEFLEX Take 1 capsule (500 mg total) by mouth 2 (two) times daily.   furosemide 40 MG tablet Commonly known as:  LASIX Take 0.5 tablets (20 mg total) by mouth daily.   Magnesium Oxide 250 MG Tabs Take 1 tablet by mouth daily as needed (constipation).   metoprolol 50 MG tablet Commonly known as:  LOPRESSOR TAKE ONE TABLET BY MOUTH TWICE DAILY What changed:  See the new instructions.   mometasone 50 MCG/ACT nasal spray Commonly known as:  NASONEX Place 2 sprays into the nose daily. What changed:  when to take this   PROAIR HFA 108 (90 Base) MCG/ACT inhaler Generic drug:  albuterol Inhale 1-2 puffs into the lungs every 4 (four) hours as needed for shortness of breath.   TYLENOL 500 MG tablet Generic drug:  acetaminophen Take 500 mg by mouth every 6 (six) hours as needed for pain.   Vitamin D3 5000 units Caps Take 1 capsule by mouth daily.      Follow-up Information    MCKEOWN,Kerry DAVID, MD. Schedule an appointment as soon as possible for a visit in 1 week(s).    Specialty:  Internal Medicine Contact information: [address] [place] [address] [phone]          Allergies  Allergen Reactions  . Codeine Other (See Comments)    Unknown  . Hydrocodone     Itch  . Oxycodone Rash  . Tramadol Rash    Consultations: none   Procedures/Studies: Dg Chest 2 View  Result Date: 06/16/2016 CLINICAL DATA:  Recent pulmonary embolism. Dyspnea and nonproductive cough today. EXAM: CHEST  2 VIEW COMPARISON:  06/08/2016 FINDINGS: Low lung volumes. No confluent consolidation. No effusions. Unchanged cardiomegaly. Pulmonary vasculature is normal. Hilar and mediastinal contours are unremarkable and unchanged. IMPRESSION: Cardiomegaly.  Low lung volumes.  No consolidation or effusion. Electronically Signed   By: Kerry Liu M.D.   On: 06/16/2016 18:16   Dg  Chest 2 View  Result Date: 06/08/2016 CLINICAL DATA:  65 y/o  F; shortness of breath. EXAM: CHEST  2 VIEW COMPARISON:  04/19/2016 chest radiograph FINDINGS: Low lung volumes. Stable cardiomegaly given projection and technique. No focal consolidation. No pleural effusion. Multilevel degenerative changes of thoracic spine. IVC filter. Upper abdominal surgical clips, presumably cholecystectomy. IMPRESSION: Stable cardiomegaly. Low lung volumes. No acute pulmonary process identified. Electronically Signed   By: Mitzi Hansen M.D.   On: 06/08/2016 02:46   Ct Angio Chest Pe W Or Wo Contrast  Result Date: 06/16/2016 CLINICAL DATA:  Shortness of breath dry cough and fever EXAM: CT ANGIOGRAPHY CHEST WITH CONTRAST TECHNIQUE: Multidetector CT imaging of the chest was performed using the standard protocol during bolus administration of intravenous contrast. Multiplanar CT image reconstructions and MIPs were obtained to evaluate the vascular anatomy. CONTRAST:  125 mL Isovue 370 intravenous COMPARISON:  Chest x-ray 06/16/2016, CT chest 06/08/2016, 11/21/2012 FINDINGS:  Cardiovascular: Examination is limited by patient's large body habitus and respiratory motion artifact. Contrast bolus is sub optimal for evaluation of lower order pulmonary artery branches. Main pulmonary artery trunk is enlarged, measuring 3.6 cm. Previously noted saddle embolus is not identified. Emboli within the distal right pulmonary artery are not clearly identified on today's study. There is residual nonocclusive thrombus within the distal left pulmonary artery that extends into segmental and subsegmental branches of left lower lobe pulmonary arteries. There is no new thrombus. Overall thrombus appears slightly decreased with slightly improved enhancement of subsegmental lower lobe pulmonary artery's on the left side. RV LV ratio remains elevated at 1.1, previously 1.2. Thoracic aorta is non aneurysmal. No dissection. There is mild cardiomegaly. No pericardial effusion. Atherosclerosis is present. Mediastinum/Nodes: No grossly enlarged mediastinal or hilar nodes. Trachea is unremarkable. Image thyroid within normal limits. Esophagus is within normal limits. Lungs/Pleura: Stable 4 mm nodule right middle lobe, series 11, image number 37. No acute consolidation or effusion. No pneumothorax. Upper Abdomen: No acute abnormality. Musculoskeletal: Degenerative changes of the spine. No acute abnormality. Review of the MIP images confirms the above findings. IMPRESSION: 1. The examination is compromised by patient's large body habitus and excessive respiratory motion artifact as well as suboptimal contrast bolus. The previously noted saddle embolus has resolved and the previously noted right-sided pulmonary emboli are not clearly identified on the current exam. There is residual but smaller thrombus within the distal left main pulmonary artery with extension into segmental and subsegmental branches of left lower lobe pulmonary arteries, however there is improved flow within the lower order pulmonary arterial branches  on the left. There is no new thrombus visualized. RV LV ratio remains elevated at 1.1 consistent with right heart strain, but improved compared with 1.2 previously. Please note that evaluation of the RV LV ratio was difficult due to respiratory motion artifact. 2. Lung fields show no acute infiltrate or effusion. Electronically Signed   By: Kerry Liu M.D.   On: 06/16/2016 23:09   Ct Angio Chest Pe W And/or Wo Contrast  Result Date: 06/08/2016 CLINICAL DATA:  65 y/o F; dyspnea and hypoxemia with history of pulmonary embolus. EXAM: CT ANGIOGRAPHY CHEST WITH CONTRAST TECHNIQUE: Multidetector CT imaging of the chest was performed using the standard protocol during bolus administration of intravenous contrast. Multiplanar CT image reconstructions and MIPs were obtained to evaluate the vascular anatomy. CONTRAST:  80 cc Isovue 370 COMPARISON:  CT angiogram of the chest dated 11/21/2012. FINDINGS: Cardiovascular: Satisfactory opacification of the pulmonary arteries to the segmental level. Saddle pulmonary  embolus extending into multiple segmental pulmonary arteries. RV/LV equals 1.2. Moderate cardiomegaly. No pericardial effusion. Normal caliber thoracic aorta. Mild aortic atherosclerosis with arch calcifications. Enlarged main pulmonary artery measuring 37 mm indicating pulmonary artery hypertension. Mediastinum/Nodes: No enlarged mediastinal, hilar, or axillary lymph nodes. Thyroid gland, trachea, and esophagus demonstrate no significant findings. Lungs/Pleura: Stable 3 mm right middle lobe nodule (series 7, image 42), no additional recommendations for follow-up. No consolidation of the lungs. No pleural effusion. Upper Abdomen: No acute abnormality. Musculoskeletal: No chest wall abnormality. No acute or significant osseous findings. Review of the MIP images confirms the above findings. IMPRESSION: Positive for acute PE with saddle pulmonary embolus and CTevidence of right heart strain (RV/LV Ratio = 1.2)  consistent with at least submassive (intermediate risk) PE. The presence of right heart strain has been associated with an increased risk of morbidity and mortality. These results were called by telephone at the time of interpretation on 06/08/2016 at 4:19 am to Dr. Alona Bene , who verbally acknowledged these results. Electronically Signed   By: Mitzi Hansen M.D.   On: 06/08/2016 04:21   Ct Venogram Abd/pel  Result Date: 06/11/2016 CLINICAL DATA:  medical history significant of DVT, PE, ( after hip surgery in 10/2012) s/p of IVC placement in 2014 ( unable to remove it due to body habitus) hypertension, hyperlipidemia, OSA on CPAP, right bundle blockage, morbid obesity, who presents with 1 day history of shortness of breath. Pt tachycardic and hypotensive with elevated tropinin , AKI and found to have submassive PE on CT angio chest with rt heart strain. EXAM: CT VENOGRAM ABD-PELVIS TECHNIQUE: Multidetector CT imaging of the abdomen and pelvis was performed using the venous phase protocol after bolus administration of intravenous contrast. Multiplanar reconstructed images and MIPs were obtained and reviewed to evaluate the vascular anatomy. CONTRAST:  ISOVUE-300 IOPAMIDOL (ISOVUE-300) INJECTION 61% COMPARISON:  CT chest 06/08/2016 FINDINGS: VASCULAR Aorta: Scattered atheromatous plaque. No aneurysm, dissection, or stenosis. Celiac: Diminutive, patent SMA: Patent Renals: Both renal arteries are patent without evidence of aneurysm, dissection, vasculitis, fibromuscular dysplasia or significant stenosis. IMA: Patent without evidence of aneurysm, dissection, vasculitis or significant stenosis. Inflow: Scattered nonocclusive plaque in bilateral common iliac arteries. No aneurysm, dissection, or high-grade stenosis evident. Proximal Outflow: Bilateral common femoral and visualized portions of the superficial and profunda femoral arteries are patent without evidence of aneurysm, dissection,  vasculitis or significant stenosis. Veins: Patent portal vein, splenic vein, superior mesenteric vein. Patency of the visualized common femoral veins and iliac venous system. IVC patent. Good position of infrarenal IVC filter without any fracture or caval wall penetration. There is some entrapped thrombus within the central aspect of the legs and around the apex of the filter, without caval occlusion. Patent bilateral renal veins. Patent hepatic veins. Review of the MIP images confirms the above findings. NON-VASCULAR Lower chest: Minimal dependent subsegmental atelectasis. Filling defect in left lower lobe pulmonary arterial branch is suspected, as was seen on previous CTA. No pleural or pericardial effusion. Hepatobiliary: No focal liver abnormality is seen. No gallstones, gallbladder wall thickening, or biliary dilatation. Pancreas: Unremarkable. No pancreatic ductal dilatation or surrounding inflammatory changes. Spleen: Normal in size without focal abnormality. Adrenals/Urinary Tract: Adrenal glands are unremarkable. Kidneys are normal, without renal calculi, focal lesion, or hydronephrosis. Bladder is unremarkable. Stomach/Bowel: Stomach and small bowel are nondilated. Appendix not identified. Colon is nondilated. Lymphatic: No adenopathy localized. Reproductive: Status post hysterectomy. No adnexal masses. Other: No ascites.  No free air. Musculoskeletal: Umbilical hernia containing only  mesenteric fat. Spondylitic changes in the mid lumbar spine. Degenerative changes in the right hip. IMPRESSION: VASCULAR 1. Entrapped thrombus in the infrarenal IVC filter without caval occlusion. 2. Negative for iliac venous thrombus. 3. Scattered aortoiliac arterial plaque without aneurysm or stenosis. 4. Left lower lobe pulmonary embolus, as previously demonstrated. These results will be called to the ordering clinician or representative by the Radiologist Assistant, and communication documented in the PACS or zVision  Dashboard. NON-VASCULAR 1. No acute findings. 2. Umbilical hernia containing only  mesenteric fat. Electronically Signed   By: Kerry Liu M.D.   On: 06/11/2016 12:59   none   Subjective:  Soft stools.  Would like to go home today.  Denies abdominal pain, SOB, chest pains, nausea, vomiting.    Discharge Exam: Vitals:   06/18/16 2335 06/19/16 0542  BP:  (!) 141/75  Pulse: 77 63  Resp: 18 18  Temp:  98.1 F (36.7 C)   Vitals:   06/18/16 1329 06/18/16 2208 06/18/16 2335 06/19/16 0542  BP: 126/79 130/63  (!) 141/75  Pulse: 65 72 77 63  Resp: 18 20 18 18   Temp: 98.9 F (37.2 C) 97.9 F (36.6 C)  98.1 F (36.7 C)  TempSrc: Oral Oral  Oral  SpO2: 97% 100% 98% 98%  Weight:      Height:        General:  Obese female, no acute distress Cardiovascular: RRR, S1/S2 +, no rubs, no gallops Respiratory:  Faint wheeze bilaterally, no rales, no rhonchi Abdominal: Soft, NT, ND, bowel sounds + Extremities: no edema, no cyanosis    The results of significant diagnostics from this hospitalization (including imaging, microbiology, ancillary and laboratory) are listed below for reference.     Microbiology: Recent Results (from the past 240 hour(s))  Urine culture     Status: Abnormal   Collection Time: 06/16/16  5:02 PM  Result Value Ref Range Status   Specimen Description URINE, RANDOM  Final   Special Requests NONE  Final   Culture >=100,000 COLONIES/mL ESCHERICHIA COLI (A)  Final   Report Status 06/19/2016 FINAL  Final   Organism ID, Bacteria ESCHERICHIA COLI (A)  Final      Susceptibility   Escherichia coli - MIC*    AMPICILLIN >=32 RESISTANT Resistant     CEFAZOLIN <=4 SENSITIVE Sensitive     CEFTRIAXONE <=1 SENSITIVE Sensitive     CIPROFLOXACIN <=0.25 SENSITIVE Sensitive     GENTAMICIN <=1 SENSITIVE Sensitive     IMIPENEM <=0.25 SENSITIVE Sensitive     NITROFURANTOIN <=16 SENSITIVE Sensitive     TRIMETH/SULFA <=20 SENSITIVE Sensitive     AMPICILLIN/SULBACTAM >=32  RESISTANT Resistant     PIP/TAZO <=4 SENSITIVE Sensitive     Extended ESBL NEGATIVE Sensitive     * >=100,000 COLONIES/mL ESCHERICHIA COLI  Culture, blood (Routine x 2)     Status: None (Preliminary result)   Collection Time: 06/16/16  5:12 PM  Result Value Ref Range Status   Specimen Description BLOOD RIGHT HAND  Final   Special Requests BOTTLES DRAWN AEROBIC AND ANAEROBIC 5CC  Final   Culture   Final    NO GROWTH 3 DAYS Performed at [facility]    Report Status PENDING  Incomplete  Culture, blood (Routine x 2)     Status: None (Preliminary result)   Collection Time: 06/16/16  5:12 PM  Result Value Ref Range Status   Specimen Description BLOOD LEFT ARM  Final   Special Requests IN PEDIATRIC BOTTLE [facility]  Final   Culture   Final    NO GROWTH 3 DAYS [facility]    Report Status PENDING  Incomplete     Labs: BNP (last 3 results)  Recent Labs  04/14/16 1714 06/08/16 0551 06/16/16 1700  BNP 184.7* 35.9 155.0*   Basic Metabolic Panel:  Recent Labs Lab 06/15/16 1702 06/16/16 1712 06/17/16 0357 06/18/16 0548 06/19/16 0520  NA 141 139 140 141 141  K 4.4 4.2 4.2 4.1 4.4  CL 104 104 109 109 108  CO2 27 26 26 26 27   GLUCOSE 137* 154* 156* 112* 107*  BUN 15 17 15 19 19   CREATININE 0.98 1.18* 1.12* 0.95 0.96  CALCIUM 9.1 9.0 8.1* 8.7* 8.7*   Liver Function Tests:  Recent Labs Lab 06/15/16 1702 06/16/16 1712 06/17/16 0357  AST 22 33 36  ALT 42* 44 47  ALKPHOS 88 82 72  BILITOT 0.7 1.7* 1.2  PROT 6.7 7.1 6.0*  ALBUMIN 3.7 3.6 2.8*   No results for input(s): LIPASE, AMYLASE in the last 168 hours. No results for input(s): AMMONIA in the last 168 hours. CBC:  Recent Labs Lab 06/15/16 1702 06/16/16 1712 06/17/16 0357 06/18/16 0548 06/19/16 0520  WBC 10.2 11.0* 10.5 8.0 7.0  NEUTROABS 8,160* 8.1* 7.3  --   --   HGB 12.9 12.5 10.7* 10.0* 10.5*  HCT 40.5 38.7 33.1* 32.6* 33.2*  MCV 88.4 89.2 87.6 88.1 90.0  PLT 329 232 249 263  308   Cardiac Enzymes:  Recent Labs Lab 06/16/16 1712 06/16/16 2324 06/17/16 0357  TROPONINI 0.05* 0.05* 0.05*   BNP: Invalid input(s): POCBNP CBG: No results for input(s): GLUCAP in the last 168 hours. D-Dimer No results for input(s): DDIMER in the last 72 hours. Hgb A1c No results for input(s): HGBA1C in the last 72 hours. Lipid Profile No results for input(s): CHOL, HDL, LDLCALC, TRIG, CHOLHDL, LDLDIRECT in the last 72 hours. Thyroid function studies  Recent Labs  06/16/16 2324  TSH 3.966   Anemia work up No results for input(s): VITAMINB12, FOLATE, FERRITIN, TIBC, IRON, RETICCTPCT in the last 72 hours. Urinalysis    Component Value Date/Time   COLORURINE YELLOW 06/16/2016 1702   APPEARANCEUR CLEAR 06/16/2016 1702   LABSPEC 1.017 06/16/2016 1702   PHURINE 5.0 06/16/2016 1702   GLUCOSEU NEGATIVE 06/16/2016 1702   HGBUR MODERATE (A) 06/16/2016 1702   BILIRUBINUR NEGATIVE 06/16/2016 1702   KETONESUR NEGATIVE 06/16/2016 1702   PROTEINUR NEGATIVE 06/16/2016 1702   NITRITE POSITIVE (A) 06/16/2016 1702   LEUKOCYTESUR SMALL (A) 06/16/2016 1702   Sepsis Labs Invalid input(s): PROCALCITONIN,  WBC,  LACTICIDVEN   Time coordinating discharge: Over 30 minutes  SIGNED:   Renae Liu, Kerry Martelle, MD  [facility] 06/19/2016, 12:21 PM Pager   If 7PM-7AM, please contact night-coverage [email or web address] Password [email or web address]

## 2016-06-21 DIAGNOSIS — I2699 Other pulmonary embolism without acute cor pulmonale: Secondary | ICD-10-CM | POA: Diagnosis not present

## 2016-06-21 DIAGNOSIS — I5032 Chronic diastolic (congestive) heart failure: Secondary | ICD-10-CM | POA: Diagnosis not present

## 2016-06-21 DIAGNOSIS — I110 Hypertensive heart disease with heart failure: Secondary | ICD-10-CM | POA: Diagnosis not present

## 2016-06-21 DIAGNOSIS — G4733 Obstructive sleep apnea (adult) (pediatric): Secondary | ICD-10-CM | POA: Diagnosis not present

## 2016-06-21 DIAGNOSIS — E785 Hyperlipidemia, unspecified: Secondary | ICD-10-CM | POA: Diagnosis not present

## 2016-06-21 DIAGNOSIS — M1711 Unilateral primary osteoarthritis, right knee: Secondary | ICD-10-CM | POA: Diagnosis not present

## 2016-06-21 LAB — CULTURE, BLOOD (ROUTINE X 2)
CULTURE: NO GROWTH
Culture: NO GROWTH

## 2016-06-23 ENCOUNTER — Other Ambulatory Visit: Payer: Self-pay | Admitting: Physician Assistant

## 2016-06-23 ENCOUNTER — Telehealth: Payer: Self-pay

## 2016-06-23 DIAGNOSIS — E785 Hyperlipidemia, unspecified: Secondary | ICD-10-CM | POA: Diagnosis not present

## 2016-06-23 DIAGNOSIS — I5032 Chronic diastolic (congestive) heart failure: Secondary | ICD-10-CM | POA: Diagnosis not present

## 2016-06-23 DIAGNOSIS — G4733 Obstructive sleep apnea (adult) (pediatric): Secondary | ICD-10-CM | POA: Diagnosis not present

## 2016-06-23 DIAGNOSIS — I2699 Other pulmonary embolism without acute cor pulmonale: Secondary | ICD-10-CM | POA: Diagnosis not present

## 2016-06-23 DIAGNOSIS — I110 Hypertensive heart disease with heart failure: Secondary | ICD-10-CM | POA: Diagnosis not present

## 2016-06-23 DIAGNOSIS — M1711 Unilateral primary osteoarthritis, right knee: Secondary | ICD-10-CM | POA: Diagnosis not present

## 2016-06-23 NOTE — Telephone Encounter (Signed)
Kerry Liu reports: pt w/diarrhea C/o vaginal itching & irritation   Per provider needs follow up visit

## 2016-06-24 ENCOUNTER — Telehealth: Payer: Self-pay | Admitting: Internal Medicine

## 2016-06-24 ENCOUNTER — Other Ambulatory Visit: Payer: Self-pay | Admitting: Physician Assistant

## 2016-06-24 DIAGNOSIS — M79675 Pain in left toe(s): Secondary | ICD-10-CM | POA: Diagnosis not present

## 2016-06-24 DIAGNOSIS — M79674 Pain in right toe(s): Secondary | ICD-10-CM | POA: Diagnosis not present

## 2016-06-24 DIAGNOSIS — B351 Tinea unguium: Secondary | ICD-10-CM | POA: Diagnosis not present

## 2016-06-24 MED ORDER — FLUCONAZOLE 150 MG PO TABS: 150.0000 mg | ORAL_TABLET | Freq: Every day | ORAL | 3 refills | Status: DC

## 2016-06-24 MED ORDER — AMLODIPINE BESYLATE 10 MG PO TABS: 10.0000 mg | ORAL_TABLET | Freq: Every day | ORAL | 0 refills | Status: DC

## 2016-06-24 MED ORDER — NYSTATIN 100000 UNIT/GM EX CREA: 1.0000 "application " | TOPICAL_CREAM | Freq: Two times a day (BID) | CUTANEOUS | 1 refills | Status: DC

## 2016-06-24 NOTE — Progress Notes (Signed)
Daughter lost her amlodipine, will send in 10mg  and she can take 1/2 of the pill.  Future Appointments Date Time Provider Department Center  06/30/2016 4:00 PM Quentin MullingAmanda Tuan Tippin, PA-C [facility] None  10/18/2016 2:00 PM Terri Piedraourtney Forcucci, PA-C [facility] None  11/02/2016 10:30 AM Melvyn Novasarmen Dohmeier, MD [facility] None

## 2016-06-24 NOTE — Addendum Note (Signed)
Addended by: Quentin MullingOLLIER, AMANDA R on: 06/24/2016 10:26 AM   Modules accepted: Orders

## 2016-06-24 NOTE — Telephone Encounter (Signed)
Patient's daughter, Victorino DikeJennifer, called to advise that patient has a severe red rash from vagina to anus from incontinence. Requesting rx ointment for rash and pain. Victorino DikeJennifer stated patient does NOT use Depends or pads, but will get some for patient. Also, nothing has been set up with the respitory  therapist at [facility], [phone], left a second message for Larita FifeLynn to call us back and to call patient. [facility], ph [phone], fax [phone], for annual check faxed insurance cards. Requested today's office note be faxed over for our records.

## 2016-06-24 NOTE — Telephone Encounter (Signed)
LVM  In detail informing pt that AMLODIPINE 10mg s was sent to her pharmacy & she should take half of that equalling 5mg s. Told pt that if she had any questions to call the office.

## 2016-06-24 NOTE — Telephone Encounter (Signed)
-----   Message from Quentin MullingAmanda Collier, [place] sent at 06/23/2016  5:38 PM EST ----- Regarding: RE: Lasix Next week should be fine, she needs to take a whole lasix pill daily, go to ER if any worsening symptoms Marchelle FolksAmanda ----- Message ----- From: Gregery NaAngela D Priya Matsen, CMA Sent: 06/23/2016   4:44 PM To: Quentin MullingAmanda Collier, PA-C Subject: Lasix                                          Pt states she has started voiding now & pt was transferred to front office for a follow up visit. Wasn't sure when she should follow up so it was made for 1 wk. Is this ok?

## 2016-06-24 NOTE — Telephone Encounter (Signed)
PT AWARE OF RX SENT TO PHARMACY & HAS A FOLLOW UP APPT Thursday.

## 2016-06-24 NOTE — Telephone Encounter (Signed)
Pt states she is taking a whole lasix pill in the AM  Pt states her grandchildren cleaned her house & lost her amlodipine pills, pt would like to know if there is a way she can get some more until she can get a refill. Please advise?

## 2016-06-24 NOTE — Telephone Encounter (Signed)
Sent in diflucan and cream however needs OV for full evaluation, will go to ER if any fever, chill, etc.

## 2016-06-30 ENCOUNTER — Ambulatory Visit (INDEPENDENT_AMBULATORY_CARE_PROVIDER_SITE_OTHER): Payer: Medicare Other | Admitting: Physician Assistant

## 2016-06-30 ENCOUNTER — Encounter: Payer: Self-pay | Admitting: Physician Assistant

## 2016-06-30 VITALS — BP 132/90 | HR 83 | Temp 97.6°F | Resp 16 | Ht 62.0 in | Wt 325.2 lb

## 2016-06-30 DIAGNOSIS — R6510 Systemic inflammatory response syndrome (SIRS) of non-infectious origin without acute organ dysfunction: Secondary | ICD-10-CM | POA: Diagnosis not present

## 2016-06-30 DIAGNOSIS — J9601 Acute respiratory failure with hypoxia: Secondary | ICD-10-CM | POA: Diagnosis not present

## 2016-06-30 DIAGNOSIS — R0600 Dyspnea, unspecified: Secondary | ICD-10-CM | POA: Diagnosis not present

## 2016-06-30 DIAGNOSIS — R531 Weakness: Secondary | ICD-10-CM | POA: Diagnosis not present

## 2016-06-30 DIAGNOSIS — I2699 Other pulmonary embolism without acute cor pulmonale: Secondary | ICD-10-CM | POA: Diagnosis not present

## 2016-06-30 DIAGNOSIS — B372 Candidiasis of skin and nail: Secondary | ICD-10-CM

## 2016-06-30 DIAGNOSIS — M545 Low back pain, unspecified: Secondary | ICD-10-CM

## 2016-06-30 DIAGNOSIS — G8929 Other chronic pain: Secondary | ICD-10-CM | POA: Diagnosis not present

## 2016-06-30 DIAGNOSIS — N3000 Acute cystitis without hematuria: Secondary | ICD-10-CM

## 2016-06-30 LAB — CBC WITH DIFFERENTIAL/PLATELET
Basophils Absolute: 76 cells/uL (ref 0–200)
Basophils Relative: 1 %
EOS PCT: 3 %
Eosinophils Absolute: 228 cells/uL (ref 15–500)
HCT: 42 % (ref 35.0–45.0)
HEMOGLOBIN: 13.1 g/dL (ref 11.7–15.5)
LYMPHS ABS: 1368 {cells}/uL (ref 850–3900)
Lymphocytes Relative: 18 %
MCH: 28.1 pg (ref 27.0–33.0)
MCHC: 31.2 g/dL — AB (ref 32.0–36.0)
MCV: 90.1 fL (ref 80.0–100.0)
MONOS PCT: 10 %
MPV: 10.3 fL (ref 7.5–12.5)
Monocytes Absolute: 760 cells/uL (ref 200–950)
NEUTROS ABS: 5168 {cells}/uL (ref 1500–7800)
NEUTROS PCT: 68 %
PLATELETS: 419 10*3/uL — AB (ref 140–400)
RBC: 4.66 MIL/uL (ref 3.80–5.10)
RDW: 12.9 % (ref 11.0–15.0)
WBC: 7.6 10*3/uL (ref 3.8–10.8)

## 2016-06-30 MED ORDER — ACETAMINOPHEN-CODEINE #3 300-30 MG PO TABS: 1.0000 | ORAL_TABLET | Freq: Four times a day (QID) | ORAL | 0 refills | Status: DC | PRN

## 2016-06-30 NOTE — Patient Instructions (Addendum)
Going to send in nebulizer machine and albuterol for you to do twice daily Finish the lamisil and continue with the cream, keep area clean and dry  Continue BP meds the same, depending on kidney function we may add back on 1/2 of the losartan pills that you have   Finish the keflex  Will repeat UA C&S 1 month OV  Can take tylenol #3 as needed for neck/back pain

## 2016-06-30 NOTE — Progress Notes (Signed)
Tylenol #3 was called into pharmacy. 

## 2016-06-30 NOTE — Progress Notes (Addendum)
Hospital follow up  Assessment and Plan: Hospital visit follow up for:  Recurrent pulmonary emboli (HCC) -     DME Nebulizer machine - continue elliquis - SOB at baseline - will benefit from respiratory therapy  Acute respiratory failure with hypoxemia (HCC) -     DME Nebulizer machine -     albuterol (PROVENTIL) (2.5 MG/3ML) 0.083% nebulizer solution; Take 3 mLs (2.5 mg total) by nebulization every 6 (six) hours as needed for wheezing or shortness of breath. - SOB at baseline - will benefit from respiratory therapy - Can not afford albuterol, will benefit from neubulizer, will get RX for patient  SIRS (systemic inflammatory response syndrome) (HCC) -     CBC with Differential/Platelet -     BASIC METABOLIC PANEL WITH GFR - check labs, afebrile today  Acute cystitis without hematuria Repeat urine 1 month, finish ABX  General weakness Secondary PE, respiratory faliure, SIRS Patient having trouble performing ADL's at home, unable to cook, clean, bath self properly Suggest trying for placement in facility/rehab Continue PT/RT  PND Patient with SOB, continue lasix, will give nebulizer  Yeast dermatitis Continue lamisil, cream Keep area clean and dry  Chronic lower back pain -     acetaminophen-codeine (TYLENOL #3) 300-30 MG tablet; Take 1 tablet by mouth every 6 (six) hours as needed for moderate pain. - weight loss advised    Hospital discharge meds were reviewed, and reconciled with the patient.   Medications Discontinued During This Encounter  Medication Reason  . acetaminophen (TYLENOL) 500 MG tablet Completed Course  . fluconazole (DIFLUCAN) 150 MG tablet Completed Course  . PROAIR HFA 108 (90 Base) MCG/ACT inhaler    Over 40 minutes of exam, counseling, chart review, and complex, high/moderate level critical decision making was performed this visit.   HPI 66 y.o.female presents for follow up for transition from recent hospitalization or SNIF stay. Admit date  to the hospital was 06/16/16, patient was discharged from the hospital on 06/19/16 and our clinical staff contacted the office the day after discharge to set up a follow up appointment, patient was admitted for:  SIRS with acute respiratory failure with hypoxemia due to UTI, recurrent PE, and with generalized weakness. She had CTA in the hospital that showed improved/unchanged PE, she was discharged with keflex for the UTI, home PT due to generalized weakness, and her BB, norvasc, and lasix were discontinued due to hypotension. She also had a rash, diflucan and cream was sent in for the patient prior to her arrival here.   Angelique Blonder, is niece, has been helping, no one has been helping with her ADLs at home and she has continuing shortness of breath and trouble with ADLs and would benefit from rehab or more help. She is back on her medications for BP, she can not afford the albuterol inhaler but she states that it does help her. She has 4 more of the keflex left.   Blood pressure 132/90, pulse 83, temperature 97.6 F (36.4 C), resp. rate 16, height 5\' 2"  (1.575 m), weight (!) 325 lb 3.2 oz (147.5 kg), SpO2 96 %.  Wt Readings from Last 3 Encounters:  06/30/16 (!) 325 lb 3.2 oz (147.5 kg)  06/17/16 (!) 340 lb (154.2 kg)  06/15/16 (!) 344 lb (156 kg)   Images while in the hospital: Dg Chest 2 View  Result Date: 06/16/2016 CLINICAL DATA:  Recent pulmonary embolism. Dyspnea and nonproductive cough today. EXAM: CHEST  2 VIEW COMPARISON:  06/08/2016 FINDINGS: Low lung volumes.  No confluent consolidation. No effusions. Unchanged cardiomegaly. Pulmonary vasculature is normal. Hilar and mediastinal contours are unremarkable and unchanged. IMPRESSION: Cardiomegaly.  Low lung volumes.  No consolidation or effusion. Electronically Signed   By: Ellery Plunk M.D.   On: 06/16/2016 18:16   Ct Angio Chest Pe W Or Wo Contrast  Result Date: 06/16/2016 CLINICAL DATA:  Shortness of breath dry cough and fever EXAM:  CT ANGIOGRAPHY CHEST WITH CONTRAST TECHNIQUE: Multidetector CT imaging of the chest was performed using the standard protocol during bolus administration of intravenous contrast. Multiplanar CT image reconstructions and MIPs were obtained to evaluate the vascular anatomy. CONTRAST:  125 mL Isovue 370 intravenous COMPARISON:  Chest x-ray 06/16/2016, CT chest 06/08/2016, 11/21/2012 FINDINGS: Cardiovascular: Examination is limited by patient's large body habitus and respiratory motion artifact. Contrast bolus is sub optimal for evaluation of lower order pulmonary artery branches. Main pulmonary artery trunk is enlarged, measuring 3.6 cm. Previously noted saddle embolus is not identified. Emboli within the distal right pulmonary artery are not clearly identified on today's study. There is residual nonocclusive thrombus within the distal left pulmonary artery that extends into segmental and subsegmental branches of left lower lobe pulmonary arteries. There is no new thrombus. Overall thrombus appears slightly decreased with slightly improved enhancement of subsegmental lower lobe pulmonary artery's on the left side. RV LV ratio remains elevated at 1.1, previously 1.2. Thoracic aorta is non aneurysmal. No dissection. There is mild cardiomegaly. No pericardial effusion. Atherosclerosis is present. Mediastinum/Nodes: No grossly enlarged mediastinal or hilar nodes. Trachea is unremarkable. Image thyroid within normal limits. Esophagus is within normal limits. Lungs/Pleura: Stable 4 mm nodule right middle lobe, series 11, image number 37. No acute consolidation or effusion. No pneumothorax. Upper Abdomen: No acute abnormality. Musculoskeletal: Degenerative changes of the spine. No acute abnormality. Review of the MIP images confirms the above findings. IMPRESSION: 1. The examination is compromised by patient's large body habitus and excessive respiratory motion artifact as well as suboptimal contrast bolus. The previously  noted saddle embolus has resolved and the previously noted right-sided pulmonary emboli are not clearly identified on the current exam. There is residual but smaller thrombus within the distal left main pulmonary artery with extension into segmental and subsegmental branches of left lower lobe pulmonary arteries, however there is improved flow within the lower order pulmonary arterial branches on the left. There is no new thrombus visualized. RV LV ratio remains elevated at 1.1 consistent with right heart strain, but improved compared with 1.2 previously. Please note that evaluation of the RV LV ratio was difficult due to respiratory motion artifact. 2. Lung fields show no acute infiltrate or effusion. Electronically Signed   By: Jasmine Pang M.D.   On: 06/16/2016 23:09    Past Medical History:  Diagnosis Date  . Acute meniscal tear of knee    RIGHT  . Arthritis    "right knee" (11/21/2012)  . Elevated hemoglobin A1c   . Exertional shortness of breath    "this week" (11/21/2012)  . History of DVT (deep vein thrombosis)    IN PREGNANCY  . HTN (hypertension)    CARDIOLOGIST--  DR HOCHREIN-- CLEARANCE NOTE IN EPIC AND CHART FROM 08-21-2012  . Mixed hyperlipidemia   . Obesity   . Pulmonary embolism (HCC)    "3 today" (11/21/2012)  . RBBB (right bundle branch block with left anterior fascicular block)   . Seasonal allergies   . Sinus tachycardia   . Sleep apnea   . Vitamin D deficiency  Allergies  Allergen Reactions  . Codeine Other (See Comments)    Unknown  . Hydrocodone     Itch  . Oxycodone Rash  . Tramadol Rash      Current Outpatient Prescriptions on File Prior to Visit  Medication Sig Dispense Refill  . amLODipine (NORVASC) 10 MG tablet Take 1 tablet (10 mg total) by mouth daily. 30 tablet 0  . apixaban (ELIQUIS) 5 MG TABS tablet Take 1 tablet (5 mg total) by mouth 2 (two) times daily. 60 tablet 0  . atorvastatin (LIPITOR) 80 MG tablet Take 0.5 tablets (40 mg total) by  mouth every evening. 30 tablet 0  . cephALEXin (KEFLEX) 500 MG capsule Take 1 capsule (500 mg total) by mouth 2 (two) times daily. 8 capsule 0  . Cholecalciferol (VITAMIN D3) 5000 UNITS CAPS Take 1 capsule by mouth daily.    . furosemide (LASIX) 40 MG tablet Take 0.5 tablets (20 mg total) by mouth daily. 30 tablet 0  . Magnesium Oxide 250 MG TABS Take 1 tablet by mouth daily as needed (constipation).     . metoprolol (LOPRESSOR) 50 MG tablet TAKE ONE TABLET BY MOUTH TWICE DAILY (Patient taking differently: TAKE 50mg  TABLET BY MOUTH TWICE DAILY) 180 tablet 1  . nystatin cream (MYCOSTATIN) Apply 1 application topically 2 (two) times daily. 30 g 1  . mometasone (NASONEX) 50 MCG/ACT nasal spray Place 2 sprays into the nose daily. (Patient taking differently: Place 2 sprays into the nose 2 (two) times daily. ) 17 g 2   No current facility-administered medications on file prior to visit.     ROS: all negative except above.   Physical Exam: Filed Weights   06/30/16 1611  Weight: (!) 325 lb 3.2 oz (147.5 kg)   BP 132/90   Pulse 83   Temp 97.6 F (36.4 C)   Resp 16   Ht 5\' 2"  (1.575 m)   Wt (!) 325 lb 3.2 oz (147.5 kg)   SpO2 96%   BMI 59.48 kg/m  General Appearance: Well nourished, in no apparent distress. Eyes: PERRLA, EOMs, conjunctiva no swelling or erythema Sinuses: No Frontal/maxillary tenderness ENT/Mouth: Ext aud canals clear, TMs without erythema, bulging. No erythema, swelling, or exudate on post pharynx.  Tonsils not swollen or erythematous. Hearing normal.  Neck: Supple, thyroid normal.  Respiratory: Respiratory effort normal, with decreased diffuse breath sounds diffusely, no rales/rhonchi. .   Cardio: RRR with no MRGs. Brisk peripheral pulses with trace edema.  Abdomen: Soft, + BS, morbidly obese Non tender, no guarding, rebound, hernias, masses. Lymphatics: Non tender without lymphadenopathy.  Musculoskeletal: Full ROM, 4/5 strength Skin: healing erythematous rash  bilateral inner thighs and inguinals, no skin break down.  Warm, dry without lesions, ecchymosis.  Neuro: Cranial nerves intact. Normal muscle tone, no cerebellar symptoms.  Psych: Awake and oriented X 3, normal affect, Insight and Judgment appropriate.     Quentin MullingAmanda Lylee Corrow, PA-C 9:10 AM [facility]

## 2016-07-01 LAB — BASIC METABOLIC PANEL WITH GFR
BUN: 16 mg/dL (ref 7–25)
CO2: 28 mmol/L (ref 20–31)
Calcium: 9.8 mg/dL (ref 8.6–10.4)
Chloride: 103 mmol/L (ref 98–110)
Creat: 1.06 mg/dL — ABNORMAL HIGH (ref 0.50–0.99)
GFR, EST NON AFRICAN AMERICAN: 56 mL/min — AB (ref >=60)
GFR, Est African American: 65 mL/min (ref >=60)
Glucose, Bld: 155 mg/dL — ABNORMAL HIGH (ref 65–99)
Potassium: 4.7 mmol/L (ref 3.5–5.3)
SODIUM: 141 mmol/L (ref 135–146)

## 2016-07-01 MED ORDER — ALBUTEROL SULFATE (2.5 MG/3ML) 0.083% IN NEBU: 2.5000 mg | INHALATION_SOLUTION | Freq: Four times a day (QID) | RESPIRATORY_TRACT | 12 refills | Status: AC | PRN

## 2016-07-04 ENCOUNTER — Telehealth: Payer: Self-pay

## 2016-07-04 DIAGNOSIS — M1711 Unilateral primary osteoarthritis, right knee: Secondary | ICD-10-CM | POA: Diagnosis not present

## 2016-07-04 DIAGNOSIS — R6511 Systemic inflammatory response syndrome (SIRS) of non-infectious origin with acute organ dysfunction: Secondary | ICD-10-CM | POA: Diagnosis not present

## 2016-07-04 DIAGNOSIS — J9601 Acute respiratory failure with hypoxia: Secondary | ICD-10-CM | POA: Diagnosis not present

## 2016-07-04 DIAGNOSIS — Z86711 Personal history of pulmonary embolism: Secondary | ICD-10-CM | POA: Diagnosis not present

## 2016-07-04 DIAGNOSIS — Z9114 Patient's other noncompliance with medication regimen: Secondary | ICD-10-CM

## 2016-07-04 DIAGNOSIS — G4733 Obstructive sleep apnea (adult) (pediatric): Secondary | ICD-10-CM

## 2016-07-04 DIAGNOSIS — J309 Allergic rhinitis, unspecified: Secondary | ICD-10-CM | POA: Diagnosis not present

## 2016-07-04 DIAGNOSIS — N390 Urinary tract infection, site not specified: Secondary | ICD-10-CM | POA: Diagnosis not present

## 2016-07-04 DIAGNOSIS — M6281 Muscle weakness (generalized): Secondary | ICD-10-CM | POA: Diagnosis not present

## 2016-07-04 DIAGNOSIS — I110 Hypertensive heart disease with heart failure: Secondary | ICD-10-CM | POA: Diagnosis not present

## 2016-07-04 DIAGNOSIS — E785 Hyperlipidemia, unspecified: Secondary | ICD-10-CM | POA: Diagnosis not present

## 2016-07-04 DIAGNOSIS — I5032 Chronic diastolic (congestive) heart failure: Secondary | ICD-10-CM | POA: Diagnosis not present

## 2016-07-04 NOTE — Telephone Encounter (Signed)
Dr. Vickey Hugerohmeier:  We received this email from [facility], do you want us to bring the patient in for a sooner appt?   Ellwood HandlerHey Kristen,  Unfortunately I have to inform you that [name] did not meet compliance within the 90 days that Medicare requires.  She has been doing much better in the last month I think.  Unfortunately we will have to either pick her Cpap up or we can do a "Pick up" and let her keep it as a "loaner" until she has a face to face with Dr. Vickey Hugerohmeier discussing ordering a new Splitnight/Titration , gets the new study and we get a new Order to start the process all over again.  Medicare requires they be completely re qualified with new notes and studies showing that the proper interface and pressure have been found in order to provide a Cpap if the patient does not meet compliance within the first 90 days.  Please advise me of what Dr. Vickey Hugerohmeier would like us to do from this point. Thank you!

## 2016-07-04 NOTE — Telephone Encounter (Signed)
Kerry Liu will have to re qualify and return for a CPAP re-titration after 1 hour baseline.  Is this plan  medicare compatible ? This question is for [facility].  I will send this note to Belenda CruiseKristin, and place an order in Epic for  One hour baseline and retitration. CD

## 2016-07-05 ENCOUNTER — Telehealth: Payer: Self-pay

## 2016-07-05 NOTE — Telephone Encounter (Signed)
I emailed Heather to see if patient needs face-to-face prior to study, pt was seen in November.   I called daughter to let her know what is going on. She will speak with her mother and let her know. Patient may be in a rehab facility for 21 days and wants to check on the status of that. She asked that I call her back in about 15 min.

## 2016-07-05 NOTE — Telephone Encounter (Signed)
Spoke with Kerry FearingJames from [facility]. He had patient bring in cpap machine to look at. It was infested with roaches. He advised and educated patient on cleaning equipment. They are letting her keep machine because it cannot be returned with roaches. He encouraged her to wear machine greater than 4 hrs nightly.

## 2016-07-05 NOTE — Telephone Encounter (Signed)
[facility] will not take her machine now that it has been infested with roaches. She will not need a repeat sleep study/office visit to qualify her for a new one, since she has been allowed to keep the one she has.

## 2016-07-05 NOTE — Telephone Encounter (Signed)
I got information from Greeley CenterKristen, she [facility] word [facility] [place] [facility] could not [facility] her [facility] back and therefore gave it to the patient. I advised daughter and they will see us back for OV in May.

## 2016-07-05 NOTE — Telephone Encounter (Signed)
Daughter called back and states to let her know if [facility] needs anything further and they are willing to repeat the sleep study.

## 2016-07-07 NOTE — Telephone Encounter (Signed)
Pt called says they cannot get the settings correct for the CPAP in rehab. Advised she will take her personal CPAP to [facility] tomorrow to get settings correct

## 2016-08-02 ENCOUNTER — Ambulatory Visit (INDEPENDENT_AMBULATORY_CARE_PROVIDER_SITE_OTHER): Payer: Medicare Other | Admitting: Physician Assistant

## 2016-08-02 ENCOUNTER — Encounter: Payer: Self-pay | Admitting: Physician Assistant

## 2016-08-02 VITALS — BP 140/80 | HR 70 | Temp 97.7°F | Resp 16 | Ht 62.0 in | Wt 315.6 lb

## 2016-08-02 DIAGNOSIS — N3000 Acute cystitis without hematuria: Secondary | ICD-10-CM

## 2016-08-02 DIAGNOSIS — G8929 Other chronic pain: Secondary | ICD-10-CM | POA: Diagnosis not present

## 2016-08-02 DIAGNOSIS — I2699 Other pulmonary embolism without acute cor pulmonale: Secondary | ICD-10-CM | POA: Diagnosis not present

## 2016-08-02 DIAGNOSIS — R0600 Dyspnea, unspecified: Secondary | ICD-10-CM

## 2016-08-02 DIAGNOSIS — M545 Low back pain: Secondary | ICD-10-CM

## 2016-08-02 DIAGNOSIS — R531 Weakness: Secondary | ICD-10-CM | POA: Diagnosis not present

## 2016-08-02 LAB — CBC WITH DIFFERENTIAL/PLATELET
Basophils Absolute: 67 cells/uL (ref 0–200)
Basophils Relative: 1 %
EOS PCT: 2 %
Eosinophils Absolute: 134 cells/uL (ref 15–500)
HCT: 43.1 % (ref 35.0–45.0)
Hemoglobin: 13.6 g/dL (ref 11.7–15.5)
LYMPHS PCT: 19 %
Lymphs Abs: 1273 cells/uL (ref 850–3900)
MCH: 27.8 pg (ref 27.0–33.0)
MCHC: 31.6 g/dL — AB (ref 32.0–36.0)
MCV: 88 fL (ref 80.0–100.0)
MONOS PCT: 10 %
MPV: 10.4 fL (ref 7.5–12.5)
Monocytes Absolute: 670 cells/uL (ref 200–950)
NEUTROS ABS: 4556 {cells}/uL (ref 1500–7800)
Neutrophils Relative %: 68 %
PLATELETS: 307 10*3/uL (ref 140–400)
RBC: 4.9 MIL/uL (ref 3.80–5.10)
RDW: 13.2 % (ref 11.0–15.0)
WBC: 6.7 10*3/uL (ref 3.8–10.8)

## 2016-08-02 LAB — HEPATIC FUNCTION PANEL
ALT: 40 U/L — AB (ref 6–29)
AST: 25 U/L (ref 10–35)
Albumin: 3.6 g/dL (ref 3.6–5.1)
Alkaline Phosphatase: 105 U/L (ref 33–130)
BILIRUBIN DIRECT: 0.2 mg/dL (ref <=0.2)
BILIRUBIN INDIRECT: 0.9 mg/dL (ref 0.2–1.2)
Total Bilirubin: 1.1 mg/dL (ref 0.2–1.2)
Total Protein: 6.5 g/dL (ref 6.1–8.1)

## 2016-08-02 LAB — BASIC METABOLIC PANEL WITH GFR
BUN: 14 mg/dL (ref 7–25)
CALCIUM: 9.2 mg/dL (ref 8.6–10.4)
CO2: 27 mmol/L (ref 20–31)
Chloride: 104 mmol/L (ref 98–110)
Creat: 0.89 mg/dL (ref 0.50–0.99)
GFR, EST NON AFRICAN AMERICAN: 69 mL/min (ref >=60)
GFR, Est African American: 80 mL/min (ref >=60)
Glucose, Bld: 163 mg/dL — ABNORMAL HIGH (ref 65–99)
POTASSIUM: 3.8 mmol/L (ref 3.5–5.3)
SODIUM: 143 mmol/L (ref 135–146)

## 2016-08-02 LAB — MAGNESIUM: MAGNESIUM: 1.7 mg/dL (ref 1.5–2.5)

## 2016-08-02 LAB — TSH: TSH: 1.87 mIU/L

## 2016-08-02 MED ORDER — LOSARTAN POTASSIUM 100 MG PO TABS: 50.0000 mg | ORAL_TABLET | Freq: Every day | ORAL | 0 refills | Status: DC

## 2016-08-02 NOTE — Patient Instructions (Signed)
    Bad carbs also include fruit juice, alcohol, and sweet tea. These are empty calories that do not signal to your brain that you are full.   Please remember the good carbs are still carbs which convert into sugar. So please measure them out no more than 1/2-1 cup of rice, oatmeal, pasta, and beans  Veggies are however free foods! Pile them on.   Not all fruit is created equal. Please see the list below, the fruit at the bottom is higher in sugars than the fruit at the top. Please avoid all dried fruits.      Simple math prevails.    1st - exercise does not produce significant weight loss - at best one converts fat into muscle , "bulks up", loses inches, but usually stays "weight neutral"     2nd - think of your body weightas a check book: If you eat more calories than you burn up - you save money or gain weight .... Or if you spend more money than you put in the check book, ie burn up more calories than you eat, then you lose weight     3rd - if you walk or run 1 mile, you burn up 100 calories - you have to burn up 3,500 calories to lose 1 pound, ie you have to walk/run 35 miles to lose 1 measly pound. So if you want to lose 10 #, then you have to walk/run 350 miles, so.... clearly exercise is not the solution.     4. So if you consume 1,500 calories, then you have to burn up the equivalent of 15 miles to stay weight neutral - It also stands to reason that if you consume 1,500 cal/day and don't lose weight, then you must be burning up about 1,500 cals/day to stay weight neutral.     5. If you really want to lose weight, you must cut your calorie intake 300 calories /day and at that rate you should lose about 1 # every 3 days.   6. Please purchase Dr Joel Fuhrman's book(s) "The End of Dieting" & "Eat to Live" . It has some great concepts and recipes.      

## 2016-08-02 NOTE — Progress Notes (Signed)
Assessment and Plan:  Recurrent pulmonary emboli (HCC) Continue elliquis, will give samples  Acute cystitis without hematuria -     Urinalysis, Routine w reflex microscopic -     Urine culture  General weakness Continue PT at home, doing well -     CBC with Differential/Platelet -     BASIC METABOLIC PANEL WITH GFR -     Hepatic function panel -     TSH -     Magnesium  Chronic midline low back pain without sciatica Will get tylenol #3 and continue weight loss  PND (paroxysmal nocturnal dyspnea) Will get wedge pillow for PND,  continue weight loss, may benefit form hospital bed if it does not help  Morbid Obesity with co morbidities - long discussion about weight loss, diet, and exercise  Future Appointments Date Time Provider Department Center  08/10/2016 2:30 PM Terri Piedra, PA-C [facility] None  10/18/2016 2:00 PM Courtney Forcucci, PA-C [facility] None  11/02/2016 10:30 AM Melvyn Novas, MD [facility] None    HPI 66 y.o.female presents for 1 month follow up for recurrent PE, respiratory failure she went to pulmonary inpatient rehab for 21 days. States she is doing better with her breathing, weakness however while in the rehab she started to have flu Wednesday 1 week ago, was put on tamiflu, still has cough and feels this set her back some.  [facility] is coming out 3 x a week.  She also need repeat urine. She is on losartan 1/2 pill daily.  BMI is Body mass index is 57.72 kg/m., she is working on diet and exercise. She has joined silver sneakers and will start water aerobics.  Wt Readings from Last 3 Encounters:  08/02/16 (!) 315 lb 9.6 oz (143.2 kg)  06/30/16 (!) 325 lb 3.2 oz (147.5 kg)  06/17/16 (!) 340 lb (154.2 kg)  ' Blood pressure 140/80, pulse 70, temperature 97.7 F (36.5 C), resp. rate 16, height 5\' 2"  (1.575 m), weight (!) 315 lb 9.6 oz (143.2 kg), SpO2 97 %.    Past Medical History:  Diagnosis Date  . Acute meniscal tear of knee    RIGHT  . Arthritis     "right knee" (11/21/2012)  . Elevated hemoglobin A1c   . Exertional shortness of breath    "this week" (11/21/2012)  . History of DVT (deep vein thrombosis)    IN PREGNANCY  . HTN (hypertension)    CARDIOLOGIST--  DR HOCHREIN-- CLEARANCE NOTE IN EPIC AND CHART FROM 08-21-2012  . Mixed hyperlipidemia   . Obesity   . Pulmonary embolism (HCC)    "3 today" (11/21/2012)  . RBBB (right bundle branch block with left anterior fascicular block)   . Seasonal allergies   . Sinus tachycardia   . Sleep apnea   . Vitamin D deficiency      Allergies  Allergen Reactions  . Codeine Other (See Comments)    Unknown  . Hydrocodone     Itch  . Oxycodone Rash  . Tramadol Rash    Current Outpatient Prescriptions on File Prior to Visit  Medication Sig  . acetaminophen-codeine (TYLENOL #3) 300-30 MG tablet Take 1 tablet by mouth every 6 (six) hours as needed for moderate pain.  Marland Kitchen albuterol (PROVENTIL) (2.5 MG/3ML) 0.083% nebulizer solution Take 3 mLs (2.5 mg total) by nebulization every 6 (six) hours as needed for wheezing or shortness of breath.  Marland Kitchen amLODipine (NORVASC) 10 MG tablet Take 1 tablet (10 mg total) by mouth daily.  Marland Kitchen apixaban (ELIQUIS)  5 MG TABS tablet Take 1 tablet (5 mg total) by mouth 2 (two) times daily.  Marland Kitchen. atorvastatin (LIPITOR) 80 MG tablet Take 0.5 tablets (40 mg total) by mouth every evening.  . Cholecalciferol (VITAMIN D3) 5000 UNITS CAPS Take 1 capsule by mouth daily.  . furosemide (LASIX) 40 MG tablet Take 0.5 tablets (20 mg total) by mouth daily.  . Magnesium Oxide 250 MG TABS Take 1 tablet by mouth daily as needed (constipation).   . metoprolol (LOPRESSOR) 50 MG tablet TAKE ONE TABLET BY MOUTH TWICE DAILY (Patient taking differently: TAKE 50mg  TABLET BY MOUTH TWICE DAILY)  . nystatin cream (MYCOSTATIN) Apply 1 application topically 2 (two) times daily.  . mometasone (NASONEX) 50 MCG/ACT nasal spray Place 2 sprays into the nose daily. (Patient taking differently: Place 2  sprays into the nose 2 (two) times daily. )   No current facility-administered medications on file prior to visit.     ROS: all negative except above.   Physical Exam: Filed Weights   08/02/16 1135  Weight: (!) 315 lb 9.6 oz (143.2 kg)   BP 140/80   Pulse 70   Temp 97.7 F (36.5 C)   Resp 16   Ht 5\' 2"  (1.575 m)   Wt (!) 315 lb 9.6 oz (143.2 kg)   SpO2 97%   BMI 57.72 kg/m  General Appearance: Well nourished, in no apparent distress. Eyes: PERRLA, EOMs, conjunctiva no swelling or erythema Sinuses: No Frontal/maxillary tenderness ENT/Mouth: Ext aud canals clear, TMs without erythema, bulging. No erythema, swelling, or exudate on post pharynx.  Tonsils not swollen or erythematous. Hearing normal.  Neck: Supple, thyroid normal.  Respiratory: Respiratory effort normal, with decreased diffuse breath sounds, no rales/rhonchi. .   Cardio: RRR with no MRGs. Brisk peripheral pulses without edema.  Abdomen: Soft, + BS, morbidly obese Non tender, no guarding, rebound, hernias, masses. Lymphatics: Non tender without lymphadenopathy.  Musculoskeletal: Full ROM, 4/5 strength Skin:  Warm, dry without lesions, ecchymosis.  Neuro: Cranial nerves intact. Normal muscle tone, no cerebellar symptoms.  Psych: Awake and oriented X 3, normal affect, Insight and Judgment appropriate.     Quentin MullingAmanda Chinonso Linker, PA-C 11:42 AM [facility]

## 2016-08-03 ENCOUNTER — Ambulatory Visit: Payer: Self-pay | Admitting: Physician Assistant

## 2016-08-03 LAB — URINALYSIS, ROUTINE W REFLEX MICROSCOPIC
Bilirubin Urine: NEGATIVE
GLUCOSE, UA: NEGATIVE
Ketones, ur: NEGATIVE
Leukocytes, UA: NEGATIVE
Nitrite: NEGATIVE
PROTEIN: NEGATIVE
Specific Gravity, Urine: 1.011 (ref 1.001–1.035)
pH: 7 (ref 5.0–8.0)

## 2016-08-03 LAB — URINALYSIS, MICROSCOPIC ONLY
CRYSTALS: NONE SEEN [HPF]
Yeast: NONE SEEN [HPF]

## 2016-08-03 LAB — URINE CULTURE: Organism ID, Bacteria: NO GROWTH

## 2016-08-03 NOTE — Progress Notes (Signed)
Pt aware of lab results & voiced understanding of those results.

## 2016-08-04 NOTE — Progress Notes (Signed)
Pt aware of lab results & voiced understanding of those results.

## 2016-08-05 ENCOUNTER — Other Ambulatory Visit: Payer: Self-pay | Admitting: Internal Medicine

## 2016-08-10 ENCOUNTER — Ambulatory Visit: Payer: Self-pay | Admitting: Internal Medicine

## 2016-08-11 ENCOUNTER — Telehealth: Payer: Self-pay | Admitting: Internal Medicine

## 2016-08-11 NOTE — Telephone Encounter (Signed)
[facility] requested 60day extension on Nursing order. Quentin MullingAmanda Collier okd verbal order extension. Donita to fax order to be sign

## 2016-08-23 ENCOUNTER — Telehealth: Payer: Self-pay

## 2016-08-23 NOTE — Telephone Encounter (Signed)
Pt called to report wt increase from 300 lbs to 309 lbs & B/P of 142/100 after eating pig's feet @ church on Sunday.  Per provider pt should decrease sodium/sugar Pt takes 1/2 tablet of lasix a day  Per provider pt should increase to 1 whole pil for 2 to 3 days & keep a watch on her wt & B/P.  Pt agreed & hung up.

## 2016-08-29 ENCOUNTER — Other Ambulatory Visit: Payer: Self-pay

## 2016-08-29 NOTE — Telephone Encounter (Signed)
Pt was informed to increase LOSARTAN to 1 whole tablet daily from a half of a tablet twice a day & to come in for office visit to check kidney function.   Pt agreed & hung up.  A message was sent to front office to schedule an office visit.

## 2016-08-30 ENCOUNTER — Telehealth: Payer: Self-pay

## 2016-08-30 NOTE — Telephone Encounter (Signed)
Pt called with question on how to take her Losartan  Per provider pt is to increase from the 1/2 of tablet to 1 whole tablet  & to come in for a OV to check kidney function.   Pt & pt's home health agreed & was transferred to front office to schedule an OV.

## 2016-09-05 ENCOUNTER — Ambulatory Visit: Payer: Self-pay | Admitting: Physician Assistant

## 2016-09-07 ENCOUNTER — Encounter: Payer: Self-pay | Admitting: Physician Assistant

## 2016-09-07 ENCOUNTER — Ambulatory Visit (INDEPENDENT_AMBULATORY_CARE_PROVIDER_SITE_OTHER): Payer: Medicare Other | Admitting: Physician Assistant

## 2016-09-07 VITALS — BP 136/80 | HR 86 | Temp 97.3°F | Resp 14 | Wt 317.0 lb

## 2016-09-07 DIAGNOSIS — R531 Weakness: Secondary | ICD-10-CM | POA: Diagnosis not present

## 2016-09-07 DIAGNOSIS — R0600 Dyspnea, unspecified: Secondary | ICD-10-CM | POA: Diagnosis not present

## 2016-09-07 DIAGNOSIS — I2699 Other pulmonary embolism without acute cor pulmonale: Secondary | ICD-10-CM | POA: Diagnosis not present

## 2016-09-07 LAB — CBC WITH DIFFERENTIAL/PLATELET
Basophils Absolute: 58 cells/uL (ref 0–200)
Basophils Relative: 1 %
EOS PCT: 2 %
Eosinophils Absolute: 116 cells/uL (ref 15–500)
HEMATOCRIT: 41 % (ref 35.0–45.0)
Hemoglobin: 12.8 g/dL (ref 11.7–15.5)
LYMPHS PCT: 24 %
Lymphs Abs: 1392 cells/uL (ref 850–3900)
MCH: 27.6 pg (ref 27.0–33.0)
MCHC: 31.2 g/dL — AB (ref 32.0–36.0)
MCV: 88.6 fL (ref 80.0–100.0)
MONO ABS: 522 {cells}/uL (ref 200–950)
MONOS PCT: 9 %
MPV: 10.9 fL (ref 7.5–12.5)
NEUTROS PCT: 64 %
Neutro Abs: 3712 cells/uL (ref 1500–7800)
Platelets: 283 10*3/uL (ref 140–400)
RBC: 4.63 MIL/uL (ref 3.80–5.10)
RDW: 13.9 % (ref 11.0–15.0)
WBC: 5.8 10*3/uL (ref 3.8–10.8)

## 2016-09-07 LAB — BASIC METABOLIC PANEL WITH GFR
BUN: 13 mg/dL (ref 7–25)
CALCIUM: 9.2 mg/dL (ref 8.6–10.4)
CO2: 28 mmol/L (ref 20–31)
Chloride: 105 mmol/L (ref 98–110)
Creat: 0.85 mg/dL (ref 0.50–0.99)
GFR, EST AFRICAN AMERICAN: 84 mL/min (ref >=60)
GFR, EST NON AFRICAN AMERICAN: 73 mL/min (ref >=60)
Glucose, Bld: 144 mg/dL — ABNORMAL HIGH (ref 65–99)
POTASSIUM: 4.4 mmol/L (ref 3.5–5.3)
SODIUM: 142 mmol/L (ref 135–146)

## 2016-09-07 LAB — HEPATIC FUNCTION PANEL
ALK PHOS: 111 U/L (ref 33–130)
ALT: 26 U/L (ref 6–29)
AST: 19 U/L (ref 10–35)
Albumin: 3.4 g/dL — ABNORMAL LOW (ref 3.6–5.1)
BILIRUBIN INDIRECT: 0.8 mg/dL (ref 0.2–1.2)
Bilirubin, Direct: 0.2 mg/dL (ref <=0.2)
TOTAL PROTEIN: 6.3 g/dL (ref 6.1–8.1)
Total Bilirubin: 1 mg/dL (ref 0.2–1.2)

## 2016-09-07 MED ORDER — RIVAROXABAN 20 MG PO TABS: 20.0000 mg | ORAL_TABLET | Freq: Every day | ORAL | 5 refills | Status: DC

## 2016-09-07 NOTE — Patient Instructions (Signed)
Finish the elliquis and the next day take 1 of the xarelto 20mg  ONCE daily at supper/dinner  Will set up for patient assitance  Monitor your blood pressure at home. Go to the ER if any CP, SOB, nausea, dizziness, severe HA, changes vision/speech  Goal BP:  For patients younger than 60: Goal BP < 140/90. For patients 60 and older: Goal BP < 150/90. For patients with diabetes: Goal BP < 140/90. Your most recent BP: BP: 136/80   Take your medications faithfully as instructed. Maintain a healthy weight. Get at least 150 minutes of aerobic exercise per week. Minimize salt intake. Minimize alcohol intake  DASH Eating Plan DASH stands for "Dietary Approaches to Stop Hypertension." The DASH eating plan is a healthy eating plan that has been shown to reduce high blood pressure (hypertension). Additional health benefits may include reducing the risk of type 2 diabetes mellitus, heart disease, and stroke. The DASH eating plan may also help with weight loss. WHAT DO I NEED TO KNOW ABOUT THE DASH EATING PLAN? For the DASH eating plan, you will follow these general guidelines:  Choose foods with a percent daily value for sodium of less than 5% (as listed on the food label).  Use salt-free seasonings or herbs instead of table salt or sea salt.  Check with your health care provider or pharmacist before using salt substitutes.  Eat lower-sodium products, often labeled as "lower sodium" or "no salt added."  Eat fresh foods.  Eat more vegetables, fruits, and low-fat dairy products.  Choose whole grains. Look for the word "whole" as the first word in the ingredient list.  Choose fish and skinless chicken or [place] more often than red meat. Limit fish, poultry, and meat to 6 oz (170 g) each day.  Limit sweets, desserts, sugars, and sugary drinks.  Choose heart-healthy fats.  Limit cheese to 1 oz (28 g) per day.  Eat more home-cooked food and less restaurant, buffet, and fast food.  Limit  fried foods.  Cook foods using methods other than frying.  Limit canned vegetables. If you do use them, rinse them well to decrease the sodium.  When eating at a restaurant, ask that your food be prepared with less salt, or no salt if possible. WHAT FOODS CAN I EAT? Seek help from a dietitian for individual calorie needs. Grains Whole grain or whole wheat bread. Brown rice. Whole grain or whole wheat pasta. Quinoa, bulgur, and whole grain cereals. Low-sodium cereals. Corn or whole wheat flour tortillas. Whole grain cornbread. Whole grain crackers. Low-sodium crackers. Vegetables Fresh or frozen vegetables (raw, steamed, roasted, or grilled). Low-sodium or reduced-sodium tomato and vegetable juices. Low-sodium or reduced-sodium tomato sauce and paste. Low-sodium or reduced-sodium canned vegetables.  Fruits All fresh, canned (in natural juice), or frozen fruits. Meat and Other Protein Products Ground beef (85% or leaner), grass-fed beef, or beef trimmed of fat. Skinless chicken or [place]. Ground chicken or [place]. Pork trimmed of fat. All fish and seafood. Eggs. Dried beans, peas, or lentils. Unsalted nuts and seeds. Unsalted canned beans. Dairy Low-fat dairy products, such as skim or 1% milk, 2% or reduced-fat cheeses, low-fat ricotta or cottage cheese, or plain low-fat yogurt. Low-sodium or reduced-sodium cheeses. Fats and Oils Tub margarines without trans fats. Light or reduced-fat mayonnaise and salad dressings (reduced sodium). Avocado. Safflower, olive, or canola oils. Natural peanut or almond butter. Other Unsalted popcorn and pretzels. The items listed above may not be a complete list of recommended foods or beverages. Contact your  dietitian for more options. WHAT FOODS ARE NOT RECOMMENDED? Grains White bread. White pasta. White rice. Refined cornbread. Bagels and croissants. Crackers that contain trans fat. Vegetables Creamed or fried vegetables. Vegetables in a cheese sauce.  Regular canned vegetables. Regular canned tomato sauce and paste. Regular tomato and vegetable juices. Fruits Dried fruits. Canned fruit in light or heavy syrup. Fruit juice. Meat and Other Protein Products Fatty cuts of meat. Ribs, chicken wings, bacon, sausage, bologna, salami, chitterlings, fatback, hot dogs, bratwurst, and packaged luncheon meats. Salted nuts and seeds. Canned beans with salt. Dairy Whole or 2% milk, cream, half-and-half, and cream cheese. Whole-fat or sweetened yogurt. Full-fat cheeses or blue cheese. Nondairy creamers and whipped toppings. Processed cheese, cheese spreads, or cheese curds. Condiments Onion and garlic salt, seasoned salt, table salt, and sea salt. Canned and packaged gravies. Worcestershire sauce. Tartar sauce. Barbecue sauce. Teriyaki sauce. Soy sauce, including reduced sodium. Steak sauce. Fish sauce. Oyster sauce. Cocktail sauce. Horseradish. Ketchup and mustard. Meat flavorings and tenderizers. Bouillon cubes. Hot sauce. Tabasco sauce. Marinades. Taco seasonings. Relishes. Fats and Oils Butter, stick margarine, lard, shortening, ghee, and bacon fat. Coconut, palm kernel, or palm oils. Regular salad dressings. Other Pickles and olives. Salted popcorn and pretzels. The items listed above may not be a complete list of foods and beverages to avoid. Contact your dietitian for more information. WHERE CAN I FIND MORE INFORMATION? National Heart, Lung, and Blood Institute: CableProm[employer].itwww.nhlbi.nih.gov/health/health-topics/topics/dash/ Document Released: 06/02/2011 Document Revised: 10/28/2013 Document Reviewed: 04/17/2013 [facility] Patient Information 2015 [place], [place]. This information is not intended to replace advice given to you by your health care provider. Make sure you discuss any questions you have with your health care provider.

## 2016-09-07 NOTE — Progress Notes (Signed)
Assessment and Plan:  Recurrent pulmonary emboli (HCC) Will switch from xartelto will give samples and try to get patient assistance With issues driving, SOB, etc better if patient was not on coumadin.  Will refer to pumonary  General weakness Continue PT PRN,doing well  PND (paroxysmal nocturnal dyspnea) Continue wedge pillow for PND,  continue weight loss  Morbid Obesity with co morbidities - long discussion about weight loss, diet, and exercise  Future Appointments Date Time Provider Department Center  10/18/2016 2:00 PM Courtney Forcucci, PA-C [facility] None  11/02/2016 10:30 AM Melvyn Novas, MD [facility] None    HPI 66 y.o.female presents for 1 month follow up for recurrent PE in 06/08/2016, respiratory failure she went to pulmonary inpatient rehab for 21 days. States she is doing better with her breathing, weakness, [facility] is coming out 3 x a week is checking BP, breathing, HR, and helping medications. Elliquis is too expensive, has been on xarelto in the past.  Her BP has been elevated, her losartan was increased to a full pill.  BMI is Body mass index is 57.98 kg/m., she is working on diet and exercise. She has joined silver sneakers and will start water aerobics.  Wt Readings from Last 3 Encounters:  09/07/16 (!) 317 lb (143.8 kg)  08/02/16 (!) 315 lb 9.6 oz (143.2 kg)  06/30/16 (!) 325 lb 3.2 oz (147.5 kg)  ' Blood pressure 136/80, pulse 86, temperature 97.3 F (36.3 C), resp. rate 14, weight (!) 317 lb (143.8 kg), SpO2 96 %.    Past Medical History:  Diagnosis Date  . Acute meniscal tear of knee    RIGHT  . Arthritis    "right knee" (11/21/2012)  . Elevated hemoglobin A1c   . Exertional shortness of breath    "this week" (11/21/2012)  . History of DVT (deep vein thrombosis)    IN PREGNANCY  . HTN (hypertension)    CARDIOLOGIST--  DR HOCHREIN-- CLEARANCE NOTE IN EPIC AND CHART FROM 08-21-2012  . Mixed hyperlipidemia   . Obesity   . Pulmonary embolism (HCC)     "3 today" (11/21/2012)  . RBBB (right bundle branch block with left anterior fascicular block)   . Seasonal allergies   . Sinus tachycardia   . Sleep apnea   . Vitamin D deficiency      Allergies  Allergen Reactions  . Codeine Other (See Comments)    Unknown  . Hydrocodone     Itch  . Oxycodone Rash  . Tramadol Rash    Current Outpatient Prescriptions on File Prior to Visit  Medication Sig  . acetaminophen-codeine (TYLENOL #3) 300-30 MG tablet Take 1 tablet by mouth every 6 (six) hours as needed for moderate pain.  Marland Kitchen albuterol (PROVENTIL) (2.5 MG/3ML) 0.083% nebulizer solution Take 3 mLs (2.5 mg total) by nebulization every 6 (six) hours as needed for wheezing or shortness of breath.  Marland Kitchen amLODipine (NORVASC) 10 MG tablet Take 1 tablet (10 mg total) by mouth daily.  Marland Kitchen apixaban (ELIQUIS) 5 MG TABS tablet Take 1 tablet (5 mg total) by mouth 2 (two) times daily.  Marland Kitchen atorvastatin (LIPITOR) 80 MG tablet Take 0.5 tablets (40 mg total) by mouth every evening.  . Cholecalciferol (VITAMIN D3) 5000 UNITS CAPS Take 1 capsule by mouth daily.  . furosemide (LASIX) 40 MG tablet Take 0.5 tablets (20 mg total) by mouth daily.  Marland Kitchen losartan (COZAAR) 100 MG tablet Take 0.5 tablets (50 mg total) by mouth daily.  . Magnesium Oxide 250 MG TABS Take  1 tablet by mouth daily as needed (constipation).   . metoprolol (LOPRESSOR) 50 MG tablet TAKE ONE TABLET BY MOUTH TWICE DAILY  . nystatin cream (MYCOSTATIN) Apply 1 application topically 2 (two) times daily.  . mometasone (NASONEX) 50 MCG/ACT nasal spray Place 2 sprays into the nose daily. (Patient taking differently: Place 2 sprays into the nose 2 (two) times daily. )   No current facility-administered medications on file prior to visit.     ROS: all negative except above.   Physical Exam: Filed Weights   09/07/16 1524  Weight: (!) 317 lb (143.8 kg)   BP 136/80   Pulse 86   Temp 97.3 F (36.3 C)   Resp 14   Wt (!) 317 lb (143.8 kg)   SpO2 96%    BMI 57.98 kg/m  General Appearance: Well nourished, in no apparent distress. Eyes: PERRLA, EOMs, conjunctiva no swelling or erythema Sinuses: No Frontal/maxillary tenderness ENT/Mouth: Ext aud canals clear, TMs without erythema, bulging. No erythema, swelling, or exudate on post pharynx.  Tonsils not swollen or erythematous. Hearing normal.  Neck: Supple, thyroid normal.  Respiratory: Respiratory effort normal, with decreased diffuse breath sounds, no rales/rhonchi. .   Cardio: RRR with no MRGs. Brisk peripheral pulses without edema.  Abdomen: Soft, + BS, morbidly obese Non tender, no guarding, rebound, hernias, masses. Lymphatics: Non tender without lymphadenopathy.  Musculoskeletal: Full ROM, 4/5 strength Skin:  Warm, dry without lesions, ecchymosis.  Neuro: Cranial nerves intact. Normal muscle tone, no cerebellar symptoms.  Psych: Awake and oriented X 3, normal affect, Insight and Judgment appropriate.     Quentin MullingAmanda Pascha Fogal, PA-C 3:35 PM [facility]

## 2016-09-08 NOTE — Progress Notes (Signed)
Pt aware of lab results & voiced understanding of those results.

## 2016-09-27 ENCOUNTER — Institutional Professional Consult (permissible substitution): Payer: Medicare Other | Admitting: Pulmonary Disease

## 2016-10-04 ENCOUNTER — Encounter: Payer: Self-pay | Admitting: Pulmonary Disease

## 2016-10-04 ENCOUNTER — Ambulatory Visit (INDEPENDENT_AMBULATORY_CARE_PROVIDER_SITE_OTHER): Payer: Medicare Other | Admitting: Pulmonary Disease

## 2016-10-04 DIAGNOSIS — G4733 Obstructive sleep apnea (adult) (pediatric): Secondary | ICD-10-CM | POA: Diagnosis not present

## 2016-10-04 DIAGNOSIS — I2699 Other pulmonary embolism without acute cor pulmonale: Secondary | ICD-10-CM | POA: Diagnosis not present

## 2016-10-04 DIAGNOSIS — Z9989 Dependence on other enabling machines and devices: Secondary | ICD-10-CM | POA: Diagnosis not present

## 2016-10-04 NOTE — Patient Instructions (Addendum)
She will need to stay on blood thinner lifelong Let us know if Xarelto is too expensive and we can discuss alternative such as Coumadin

## 2016-10-04 NOTE — Progress Notes (Signed)
Subjective:    Patient ID: Kerry Liu, female    DOB: 1951-05-18, 66 y.o.   MRN: [id number]  HPI  Chief Complaint  Patient presents with  . Advice Only    per Dr. Steffanie Dunn for recurrent PE. pt reports of sob with exertion, non prod cough & wheezing x32mo    66 year old never smoker referred for evaluation of pulmonary emboli. She had knee surgery and soon after that in 10/2012 she developed right lower extremity DVT with bilateral pulmonary emboli. An IVC filter was placed and she was put on anticoagulation. She had a second episode of pulmonary Embolism in 05/2016, this time without a clear risk factor except for obesity. CT chest 06/08/17  showed saddle embolus with RV/ LV Ratio of 1.2. She was anticoagulated this time with Eliquis. CT venogram was done which showed entrapped clot in IVC filter without occlusion, this was reviewed by the interventional radiologist.  She was readmitted 2 weeks later for a UTI  and underwent repeat CT angiogram which showed that this hiatal embolus had resolved and RV/LV ratio had decreased.  Eliquis was too expensive and her PCP visit in 08/2015 this was changed to Xarelto which she has been obtaining with samples. She does have bilateral lower extremities edema for which she takes Lasix 20 mg daily. She reports that her dyspnea is much improved. She also has obstructive sleep apnea and is compliant with nocturnal CPAP.  She reports occasional wheezing and has albuterol nebulizer. She is a lifetime never smoker, denies frequent chest colds or cough    Past Medical History:  Diagnosis Date  . Acute meniscal tear of knee    RIGHT  . Arthritis    "right knee" (11/21/2012)  . Elevated hemoglobin A1c   . Exertional shortness of breath    "this week" (11/21/2012)  . History of DVT (deep vein thrombosis)    IN PREGNANCY  . HTN (hypertension)    CARDIOLOGIST--  DR HOCHREIN-- CLEARANCE NOTE IN EPIC AND CHART FROM 08-21-2012  . Mixed hyperlipidemia   .  Obesity   . Pulmonary embolism (HCC)    "3 today" (11/21/2012)  . RBBB (right bundle branch block with left anterior fascicular block)   . Seasonal allergies   . Sinus tachycardia   . Sleep apnea   . Vitamin D deficiency      Past Surgical History:  Procedure Laterality Date  . IVC filter placed in May 2014 N/A   . KNEE ARTHROSCOPY Right 12-09-2002  . KNEE ARTHROSCOPY WITH MEDIAL MENISECTOMY Right 10/05/2012   Procedure: KNEE ARTHROSCOPY WITH MEDIAL MENISECTOMY;  Surgeon: Jacki Cones, MD;  Location: [facility];  Service: Orthopedics;  Laterality: Right;  . TOTAL ABDOMINAL HYSTERECTOMY W/ BILATERAL SALPINGOOPHORECTOMY  ~ 1986   W/ BILATERAL SALPINGOOPHORECTOMY  . TRANSTHORACIC ECHOCARDIOGRAM  11-17-2010   MILD LVH/ EF 60-65%/ GRADE I DIASTOLIC DYSFUNCTION/ MILD LAE / MILD RAE    Allergies  Allergen Reactions  . Codeine Other (See Comments)    Unknown  . Hydrocodone     Itch  . Oxycodone Rash  . Tramadol Rash    Social History   Social History  . Marital status: Single    Spouse name: N/A  . Number of children: N/A  . Years of education: N/A   Occupational History  . housekeeper [employer]   Social History Main Topics  . Smoking status: Never Smoker  . Smokeless tobacco: Never Used  . Alcohol use No  . Drug use:  No  . Sexual activity: No   Other Topics Concern  . Not on file   Social History Narrative  . No narrative on file      Family History  Problem Relation Age of Onset  . Heart attack    . Heart attack Father   . Hypertension Brother   . Diabetes Brother   . Stroke Brother   . Sudden death Brother      Review of Systems  Constitutional: Negative for fever and unexpected weight change.  HENT: Positive for sneezing. Negative for congestion, dental problem, ear pain, nosebleeds, postnasal drip, rhinorrhea, sinus pressure, sore throat and trouble swallowing.   Eyes: Negative for redness and itching.  Respiratory: Positive for  cough, shortness of breath and wheezing. Negative for chest tightness.   Cardiovascular: Negative for palpitations and leg swelling.  Gastrointestinal: Negative for nausea and vomiting.  Genitourinary: Negative for dysuria.  Musculoskeletal: Negative for joint swelling.  Skin: Negative for rash.  Neurological: Negative for headaches.  Hematological: Does not bruise/bleed easily.  Psychiatric/Behavioral: Negative for dysphoric mood. The patient is not nervous/anxious.        Objective:   Physical Exam  Gen. Pleasant, obese, in no distress, normal affect ENT - no lesions, no post nasal drip, class 2-3 airway Neck: No JVD, no thyromegaly, no carotid bruits Lungs: no use of accessory muscles, no dullness to percussion, decreased without rales or rhonchi  Cardiovascular: Rhythm regular, heart sounds  normal, no murmurs or gallops, 2+ peripheral edema Abdomen: soft and non-tender, no hepatosplenomegaly, BS normal. Musculoskeletal: No deformities, no cyanosis or clubbing Neuro:  alert, non focal, no tremors       Assessment & Plan:

## 2016-10-04 NOTE — Assessment & Plan Note (Addendum)
She will need to stay on blood thinner lifelong Let us know if Xarelto is too expensive and we can discuss alternative such as Coumadin Unfortunately IVC filter cannot be removed  Now. Bilateral lower extremities edema may be related to IVC filter

## 2016-10-04 NOTE — Assessment & Plan Note (Signed)
Ct CPAP 

## 2016-10-18 ENCOUNTER — Encounter: Payer: Self-pay | Admitting: Internal Medicine

## 2016-11-02 ENCOUNTER — Ambulatory Visit: Payer: Medicare Other | Admitting: Neurology

## 2016-11-02 ENCOUNTER — Telehealth: Payer: Self-pay | Admitting: Neurology

## 2016-11-02 NOTE — Telephone Encounter (Signed)
Pt did not show for their appt with Dr. Dohmeier today.  

## 2016-11-03 ENCOUNTER — Encounter: Payer: Self-pay | Admitting: Neurology

## 2016-11-27 NOTE — Progress Notes (Deleted)
WELCOME TO MEDICARE WELLNESS VISIT AND CPE  Assessment:     Over 40 minutes of exam, counseling, chart review and critical decision making was performed  Plan:   During the course of the visit the patient was educated and counseled about appropriate screening and preventive services including:    Pneumococcal vaccine   Prevnar 13  Influenza vaccine  Td vaccine  Screening electrocardiogram  Bone densitometry screening  Colorectal cancer screening  Diabetes screening  Glaucoma screening  Nutrition counseling   Advanced directives: requested   Subjective:  Kerry Liu is a 66 y.o. female who presents for Medicare Annual Wellness Visit and complete physical.  Date of last medicare wellness visit is unknown.  She has had elevated blood pressure treated with metoprolol and hyzaar. Her blood pressure has been controlled at home, today their BP is  .  She reports that she hasn't taken her medications today.  She reports that she is taking medications at night time.  She reports that she checks them at [facility] and [facility] and she gets around 128/90 or maybe mildly higher.   She does not workout. She denies chest pain, shortness of breath, dizziness.  She has a history of recurrent PE, on xarelto, has chronic SOB from morbid obesity.   She is on cholesterol medication and denies myalgias. Her cholesterol is not at goal. The cholesterol last visit was:   Lab Results  Component Value Date   CHOL 137 06/09/2016   HDL 41 06/09/2016   LDLCALC 84 06/09/2016   TRIG 61 06/09/2016   CHOLHDL 3.3 06/09/2016   She has had prediabetes which has been managed with diet and exercise.  She is not currently on medication.  She has not been working on diet and exercise for prediabetes, and denies foot ulcerations, hyperglycemia, hypoglycemia , increased appetite, nausea, paresthesia of the feet, polydipsia, polyuria, visual disturbances, vomiting and weight loss. Last A1C in the office was:   Lab Results  Component Value Date   HGBA1C 6.3 (H) 06/09/2016   Last GFR: Lab Results  Component Value Date   GFRAA 84 09/07/2016   Patient is on Vitamin D supplement.   Lab Results  Component Value Date   VD25OH 16 (L) 03/01/2016     BMI is There is no height or weight on file to calculate BMI., she is working on diet and exercise. Wt Readings from Last 3 Encounters:  10/04/16 (!) 321 lb (145.6 kg)  09/07/16 (!) 317 lb (143.8 kg)  08/02/16 (!) 315 lb 9.6 oz (143.2 kg)     Medication Review: Current Outpatient Prescriptions on File Prior to Visit  Medication Sig Dispense Refill  . acetaminophen-codeine (TYLENOL #3) 300-30 MG tablet Take 1 tablet by mouth every 6 (six) hours as needed for moderate pain. 30 tablet 0  . albuterol (PROVENTIL) (2.5 MG/3ML) 0.083% nebulizer solution Take 3 mLs (2.5 mg total) by nebulization every 6 (six) hours as needed for wheezing or shortness of breath. 75 mL 12  . amLODipine (NORVASC) 10 MG tablet Take 1 tablet (10 mg total) by mouth daily. 30 tablet 0  . atorvastatin (LIPITOR) 80 MG tablet Take 0.5 tablets (40 mg total) by mouth every evening. 30 tablet 0  . Cholecalciferol (VITAMIN D3) 5000 UNITS CAPS Take 1 capsule by mouth daily.    . furosemide (LASIX) 40 MG tablet Take 0.5 tablets (20 mg total) by mouth daily. 30 tablet 0  . losartan (COZAAR) 100 MG tablet Take 0.5 tablets (50 mg total)  by mouth daily. 30 tablet 0  . Magnesium Oxide 250 MG TABS Take 1 tablet by mouth daily as needed (constipation).     . metoprolol (LOPRESSOR) 50 MG tablet TAKE ONE TABLET BY MOUTH TWICE DAILY 180 tablet 1  . mometasone (NASONEX) 50 MCG/ACT nasal spray Place 2 sprays into the nose daily. (Patient taking differently: Place 2 sprays into the nose 2 (two) times daily. ) 17 g 2  . nystatin cream (MYCOSTATIN) Apply 1 application topically 2 (two) times daily. 30 g 1  . rivaroxaban (XARELTO) 20 MG TABS tablet Take 1 tablet (20 mg total) by mouth daily with supper.  30 tablet 5   No current facility-administered medications on file prior to visit.     Current Problems (verified) Patient Active Problem List   Diagnosis Date Noted  . Acute respiratory failure with hypoxemia (HCC) 06/12/2016  . OSA on CPAP 06/08/2016  . Chronic diastolic CHF (congestive heart failure) (HCC) 06/08/2016  . 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 Medication management 07/10/2015  . Mixed hyperlipidemia   . Seasonal allergies   . Prediabetes   . Morbid obesity (HCC)   . Vitamin D deficiency   . History of DVT (deep vein thrombosis)   . Pulmonary embolism (HCC)   . Osteoarthritis of right knee 10/05/2012  . Essential hypertension 03/25/2009    Screening Tests Immunization History  Administered Date(s) Administered  . Influenza-Unspecified 03/16/2016  . Tdap 04/28/2015    Preventative care: Last colonoscopy: Dr. Kinnie ScalesMedoff, 2016 Last mammogram: 12/18/13 Last pap smear/pelvic exam: Pap smear 2016 by patient report, there is no documentation in epic DEXA: Will order, no baseline noted in chart  Prior vaccinations: TD or Tdap: 2016  Influenza: declined  Pneumococcal: Declined Prevnar13:  Declined Shingles/Zostavax: Declined Names of Other Physician/Practitioners you currently use: 1. [facility] here for primary care 2. Does not see one, eye doctor, last visit remotely 3. Dr. Dahlia ClientBrowning, dentist, last visit 2016 Patient Care Team: Lucky CowboyMcKeown, William, MD as PCP - General (Internal Medicine) Kennon RoundsWeaver, Scott T, PA-C as Physician Assistant (Cardiology) Rollene RotundaHochrein, James, MD as Consulting Physician (Cardiology) Pa, [facility] as Consulting Physician (Specialist)  Allergies Allergies  Allergen Reactions  . Codeine Other (See Comments)    Unknown  . Hydrocodone     Itch  . Oxycodone Rash  . Tramadol Rash    SURGICAL HISTORY She  has a past surgical history that includes Knee arthroscopy (Right,  12-09-2002); transthoracic echocardiogram (11-17-2010); Total abdominal hysterectomy w/ bilateral salpingoophorectomy (~ 1986); Knee arthroscopy with medial menisectomy (Right, 10/05/2012); and IVC filter placed in May 2014 (N/A). FAMILY HISTORY Her family history includes Diabetes in her brother; Heart attack in her father; Hypertension in her brother; Stroke in her brother; Sudden death in her brother. SOCIAL HISTORY She  reports that she has never smoked. She has never used smokeless tobacco. She reports that she does not drink alcohol or use drugs.  MEDICARE WELLNESS OBJECTIVES: Tobacco use: She does smoke.  Patient is a former smoker. If yes, counseling given Alcohol Current alcohol use: none Diet: well balanced Physical activity:   Cardiac risk factors:   Depression/mood screen:   Depression screen [facility] 10/22/2015  Decreased Interest 0  Down, Depressed, Hopeless 0  PHQ - 2 Score 0    ADLs:  In your present state of health, do you have any difficulty performing the following activities: 06/16/2016 06/08/2016  Hearing? N N  Vision? N N  Difficulty concentrating or  making decisions? N N  Walking or climbing stairs? N N  Dressing or bathing? N N  Doing errands, shopping? N N  Some recent data might be hidden     Cognitive Testing  Alert? Yes  Normal Appearance?Yes  Oriented to person? Yes  Place? Yes   Time? Yes  Recall of three objects?  Yes  Can perform simple calculations? Yes  Displays appropriate judgment?Yes  Can read the correct time from a watch face?Yes  EOL planning:    Review of Systems  Constitutional: Negative for chills, fever and malaise/fatigue.  HENT: Negative for congestion, ear pain and sore throat.   Eyes: Negative.   Respiratory: Positive for shortness of breath. Negative for cough, sputum production and wheezing.        Witnessed apneas reported by friend  Cardiovascular: Negative for chest pain, palpitations and leg swelling.   Gastrointestinal: Negative for abdominal pain, blood in stool, constipation, diarrhea, heartburn and melena.  Genitourinary: Negative.   Skin: Negative.   Neurological: Negative for dizziness, sensory change, loss of consciousness and headaches.  Psychiatric/Behavioral: Negative for depression. The patient is not nervous/anxious and does not have insomnia.      Objective:     There were no vitals filed for this visit. There is no height or weight on file to calculate BMI.  General appearance: alert, no distress, WD/WN, female HEENT: normocephalic, sclerae anicteric, TMs pearly, nares patent, no discharge or erythema, pharynx normal Oral cavity: MMM, no lesions Neck: supple, no lymphadenopathy, no thyromegaly, no masses Heart: RRR, normal S1, S2, no murmurs Lungs: CTA bilaterally, no wheezes, rhonchi, or rales Abdomen: +bs, soft, non tender, non distended, no masses, no hepatomegaly, no splenomegaly Musculoskeletal: nontender, no swelling, no obvious deformity Extremities: no edema, no cyanosis, no clubbing Pulses: 2+ symmetric, upper and lower extremities, normal cap refill Neurological: alert, oriented x 3, CN2-12 intact, strength normal upper extremities and lower extremities, sensation normal throughout, DTRs 2+ throughout, no cerebellar signs, gait normal Psychiatric: normal affect, behavior normal, pleasant   Medicare Attestation I have personally reviewed: The patient's medical and social history Their use of alcohol, tobacco or illicit drugs Their current medications and supplements The patient's functional ability including ADLs,fall risks, home safety risks, cognitive, and hearing and visual impairment Diet and physical activities Evidence for depression or mood disorders  The patient's weight, height, BMI, and visual acuity have been recorded in the chart.  I have made referrals, counseling, and provided education to the patient based on review of the above and I have  provided the patient with a written personalized care plan for preventive services.     Quentin Mulling, PA-C   11/27/2016

## 2016-11-28 ENCOUNTER — Encounter: Payer: Self-pay | Admitting: Physician Assistant

## 2017-01-18 NOTE — Progress Notes (Deleted)
Complete Physical  Assessment and Plan:  Discussed med's effects and SE's. Screening labs and tests as requested with regular follow-up as recommended. Over 40 minutes of exam, counseling, chart review, and complex, high level critical decision making was performed this visit.   HPI  66 y.o. 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  Her blood pressure {HAS HAS NOT:18834} been controlled at home, today their BP is   She {DOES_DOES VHQ:46962}OT:18564} workout. She denies chest pain, shortness of breath, dizziness.  She has history of recurrent PT, is on xarelto, has chronic diastolic CHF.  She {ACTION; IS/IS XBM:[id number]}OT:[id number]} on cholesterol medication and denies myalgias. Her cholesterol {ACTION; IS/IS NOT:21021397} at goal. The cholesterol last visit was:   Lab Results  Component Value Date   CHOL 137 06/09/2016   HDL 41 06/09/2016   LDLCALC 84 06/09/2016   TRIG 61 06/09/2016   CHOLHDL 3.3 06/09/2016   She {Has/has not:18111} been working on diet and exercise for prediabetes, and denies {Symptoms; diabetes w/o none:19199}. Last A1C in the office was:  Lab Results  Component Value Date   HGBA1C 6.3 (H) 06/09/2016   Last GFR: Lab Results  Component Value Date   GFRAA 84 09/07/2016   Patient is on Vitamin D supplement.   Lab Results  Component Value Date   VD25OH 16 (L) 03/01/2016     BMI is There is no height or weight on file to calculate BMI., she is working on diet and exercise. She has OSA and is on CPAP.  Wt Readings from Last 3 Encounters:  10/04/16 (!)  321 lb (145.6 kg)  09/07/16 (!) 317 lb (143.8 kg)  08/02/16 (!) 315 lb 9.6 oz (143.2 kg)    Current Medications:  Current Outpatient Prescriptions on File Prior to Visit  Medication Sig  . acetaminophen-codeine (TYLENOL #3) 300-30 MG tablet Take 1 tablet by mouth every 6 (six) hours as needed for moderate pain.  Marland Kitchen. albuterol (PROVENTIL) (2.5 MG/3ML) 0.083% nebulizer solution Take 3 mLs (2.5 mg total) by nebulization every 6 (six) hours as needed for wheezing or shortness of breath.  Marland Kitchen. amLODipine (NORVASC) 10 MG tablet Take 1 tablet (10 mg total) by mouth daily.  Marland Kitchen. atorvastatin (LIPITOR) 80 MG tablet Take 0.5 tablets (40 mg total) by mouth every evening.  . Cholecalciferol (VITAMIN D3) 5000 UNITS CAPS Take 1 capsule by mouth daily.  . furosemide (LASIX) 40 MG tablet Take 0.5 tablets (20 mg total) by mouth daily.  Marland Kitchen. losartan (COZAAR) 100 MG tablet Take 0.5 tablets (50 mg total) by mouth daily.  . Magnesium Oxide 250 MG TABS Take 1 tablet by mouth daily as needed (constipation).   . metoprolol (LOPRESSOR) 50 MG tablet TAKE ONE TABLET BY MOUTH TWICE DAILY  . mometasone (NASONEX) 50 MCG/ACT nasal spray Place 2 sprays into the nose daily. (Patient taking differently: Place 2 sprays into the nose 2 (two) times daily. )  . nystatin cream (MYCOSTATIN) Apply 1 application topically 2 (two) times daily.  . rivaroxaban (XARELTO) 20 MG TABS tablet Take 1 tablet (20 mg total) by mouth daily with supper.   No current facility-administered medications on file prior to visit.  Allergies:  Allergies  Allergen Reactions  . Codeine Other (See Comments)    Unknown  . Hydrocodone     Itch  . Oxycodone Rash  . Tramadol Rash   Medical History:  She has Essential hypertension; Osteoarthritis of right knee; Mixed hyperlipidemia; Seasonal allergies; Prediabetes; Morbid obesity (HCC); Vitamin D deficiency; History of DVT (deep vein thrombosis); Pulmonary embolism (HCC); Medication management; Nocturnal  hypoxemia due to obesity; OSA on CPAP; Chronic diastolic CHF (congestive heart failure) (HCC); and Acute respiratory failure with hypoxemia (HCC) on her problem list. Health Maintenance:   Immunization History  Administered Date(s) Administered  . Influenza-Unspecified 03/16/2016  . Tdap 04/28/2015    Preventative care: Last colonoscopy: Dr. Kinnie ScalesMedoff, 2016 Last mammogram: 12/18/13 Last pap smear/pelvic exam: Pap smear 2016 by patient report, there is no documentation in epic DEXA: Will order, no baseline noted in chart  Prior vaccinations: TD or Tdap: 2016  Influenza: declined  Pneumococcal: Declined Prevnar13:  Declined Shingles/Zostavax: Declined  Last Dental Exam: Last Eye Exam: Patient Care Team: Lucky CowboyMcKeown, William, MD as PCP - General (Internal Medicine) Kennon RoundsWeaver, Scott T, PA-C as Physician Assistant (Cardiology) Rollene RotundaHochrein, James, MD as Consulting Physician (Cardiology) Pa, [facility] as Consulting Physician (Specialist)  Surgical History:  She has a past surgical history that includes Knee arthroscopy (Right, 12-09-2002); transthoracic echocardiogram (11-17-2010); Total abdominal hysterectomy w/ bilateral salpingoophorectomy (~ 1986); Knee arthroscopy with medial menisectomy (Right, 10/05/2012); and IVC filter placed in May 2014 (N/A). Family History:  Herfamily history includes Diabetes in her brother; Heart attack in her father and unknown relative; Hypertension in her brother; Stroke in her brother; Sudden death in her brother. Social History:  She reports that she has never smoked. She has never used smokeless tobacco. She reports that she does not drink alcohol or use drugs.  Review of Systems: ROS  Physical Exam: Estimated body mass index is 58.71 kg/m as calculated from the following:   Height as of 10/04/16: 5\' 2"  (1.575 m).   Weight as of 10/04/16: 321 lb (145.6 kg). There were no vitals taken for this visit. General Appearance: Well nourished, in no  apparent distress.  Eyes: PERRLA, EOMs, conjunctiva no swelling or erythema, normal fundi and vessels.  Sinuses: No Frontal/maxillary tenderness  ENT/Mouth: Ext aud canals clear, normal light reflex with TMs without erythema, bulging. Good dentition. No erythema, swelling, or exudate on post pharynx. Tonsils not swollen or erythematous. Hearing normal.  Neck: Supple, thyroid normal. No bruits  Respiratory: Respiratory effort normal, BS equal bilaterally without rales, rhonchi, wheezing or stridor.  Cardio: RRR without murmurs, rubs or gallops. Brisk peripheral pulses without edema.  Chest: symmetric, with normal excursions and percussion.  Breasts: Symmetric, without lumps, nipple discharge, retractions.  Abdomen: Soft, nontender, no guarding, rebound, hernias, masses, or organomegaly.  Lymphatics: Non tender without lymphadenopathy.  Genitourinary:  Musculoskeletal: Full ROM all peripheral extremities,5/5 strength, and normal gait.  Skin: Warm, dry without rashes, lesions, ecchymosis. Neuro: Cranial nerves intact, reflexes equal bilaterally. Normal muscle tone, no cerebellar symptoms. Sensation intact.  Psych: Awake and oriented X 3, normal affect, Insight and Judgment appropriate.   EKG: WNL no ST changes. AORTA SCAN: WNL   Quentin MullingAmanda Jeancarlo Leffler 7:47 AM [facility]

## 2017-01-19 ENCOUNTER — Encounter: Payer: Self-pay | Admitting: Physician Assistant

## 2017-02-03 ENCOUNTER — Other Ambulatory Visit: Payer: Self-pay

## 2017-02-03 DIAGNOSIS — E782 Mixed hyperlipidemia: Secondary | ICD-10-CM

## 2017-02-03 MED ORDER — AMLODIPINE BESYLATE 10 MG PO TABS: 10.0000 mg | ORAL_TABLET | Freq: Every day | ORAL | 0 refills | Status: DC

## 2017-02-03 MED ORDER — ATORVASTATIN CALCIUM 80 MG PO TABS: 40.0000 mg | ORAL_TABLET | Freq: Every evening | ORAL | 0 refills | Status: DC

## 2017-03-24 NOTE — Progress Notes (Signed)
MEDICARE WELLNESS VISIT   Assessment:   Essential hypertension - continue medications, DASH diet, exercise and monitor at home. Call if greater than 130/80.  - GET BACK ON METORPOLOL -     metoprolol tartrate (LOPRESSOR) 50 MG tablet; Take 1 tablet (50 mg total) by mouth 2 (two) times daily. -     amLODipine (NORVASC) 10 MG tablet; Take 1 tablet (10 mg total) by mouth daily. -     losartan (COZAAR) 100 MG tablet; Take 1 tablet (100 mg total) by mouth daily. -     CBC with Differential/Platelet -     BASIC METABOLIC PANEL WITH GFR -     Hepatic function panel -     TSH  Acute saddle pulmonary embolism without acute cor pulmonale (HCC) -     rivaroxaban (XARELTO) 20 MG TABS tablet; Take 1 tablet (20 mg total) by mouth daily with supper. - samples given  Chronic diastolic CHF (congestive heart failure) (HCC) Weight is down, continue weight loss  Mixed hyperlipidemia -     atorvastatin (LIPITOR) 80 MG tablet; Take 0.5 tablets (40 mg total) by mouth every evening. -     Lipid panel - -continue medications, check lipids, decrease fatty foods, increase activity.   Morbid obesity (HCC) - long discussion about weight loss, diet, and exercise  Prediabetes -     Hemoglobin A1c Discussed general issues about diabetes pathophysiology and management., Educational material distributed., Suggested low cholesterol diet., Encouraged aerobic exercise., Discussed foot care., Reminded to get yearly retinal exam.  Medication management -     Magnesium  Vitamin D deficiency Continue supplement  OSA on CPAP Sleep apnea- continue CPAP, CPAP is helping with daytime fatigue, weight loss still advised.   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   Seasonal allergic rhinitis, unspecified trigger - Allegra OTC, increase H20, allergy hygiene explained.  Encounter for Medicare annual wellness exam Get  MGM   Over 40 minutes of exam, counseling, chart review and critical decision making was performed  Plan:   During the course of the visit the patient was educated and counseled about appropriate screening and preventive services including:    Pneumococcal vaccine   Prevnar 13  Influenza vaccine  Td vaccine  Screening electrocardiogram  Bone densitometry screening  Colorectal cancer screening  Diabetes screening  Glaucoma screening  Nutrition counseling   Advanced directives: requested    Subjective:  Kerry Liu is a 66 y.o. female who presents for Medicare Annual Wellness Visit and complete physical.    She has had elevated blood pressure, has only been on norvasc and losartan, has not been on metoprolol. Her blood pressure has been controlled at home, today their BP is BP: (!) 158/90.  She has history of recurrent PE ,on xarelto, he has OSA, on CPAP, has diastolic HF. She follows with Dr. Vassie Loll.  She does not workout. She denies chest pain, shortness of breath, dizziness.  She is on cholesterol medication and denies myalgias. Her cholesterol is not at goal. The cholesterol last visit was:   Lab Results  Component Value Date   CHOL 137 06/09/2016   HDL 41 06/09/2016   LDLCALC 84 06/09/2016   TRIG 61 06/09/2016   CHOLHDL 3.3 06/09/2016   She has had prediabetes which has been managed with diet and exercise.  She is not currently on medication.  She has not been working on diet and exercise for prediabetes,  and denies foot ulcerations, hyperglycemia, hypoglycemia , increased appetite, nausea, paresthesia of the feet, polydipsia, polyuria, visual disturbances, vomiting and weight loss. Last A1C in the office was:  Lab Results  Component Value Date   HGBA1C 6.3 (H) 06/09/2016   Last GFR: Lab Results  Component Value Date   GFRAA 84 09/07/2016   Patient is on Vitamin D supplement.   Lab Results  Component Value Date   VD25OH 16 (L) 03/01/2016     BMI is  Body mass index is 57.29 kg/m., she is working on diet and exercise. He is on CPAP. She is trying to drive, she is taking care of her grandson, he is 75 he has incontinence due to meningitis.  Wt Readings from Last 3 Encounters:  03/28/17 (!) 313 lb 3.2 oz (142.1 kg)  10/04/16 (!) 321 lb (145.6 kg)  09/07/16 (!) 317 lb (143.8 kg)      Medication Review: Current Outpatient Prescriptions on File Prior to Visit  Medication Sig Dispense Refill  . acetaminophen-codeine (TYLENOL #3) 300-30 MG tablet Take 1 tablet by mouth every 6 (six) hours as needed for moderate pain. 30 tablet 0  . albuterol (PROVENTIL) (2.5 MG/3ML) 0.083% nebulizer solution Take 3 mLs (2.5 mg total) by nebulization every 6 (six) hours as needed for wheezing or shortness of breath. 75 mL 12  . amLODipine (NORVASC) 10 MG tablet Take 1 tablet (10 mg total) by mouth daily. 30 tablet 0  . atorvastatin (LIPITOR) 80 MG tablet Take 0.5 tablets (40 mg total) by mouth every evening. 30 tablet 0  . Cholecalciferol (VITAMIN D3) 5000 UNITS CAPS Take 1 capsule by mouth daily.    . furosemide (LASIX) 40 MG tablet Take 0.5 tablets (20 mg total) by mouth daily. 30 tablet 0  . losartan (COZAAR) 100 MG tablet Take 0.5 tablets (50 mg total) by mouth daily. 30 tablet 0  . Magnesium Oxide 250 MG TABS Take 1 tablet by mouth daily as needed (constipation).     . metoprolol (LOPRESSOR) 50 MG tablet TAKE ONE TABLET BY MOUTH TWICE DAILY 180 tablet 1  . nystatin cream (MYCOSTATIN) Apply 1 application topically 2 (two) times daily. 30 g 1  . rivaroxaban (XARELTO) 20 MG TABS tablet Take 1 tablet (20 mg total) by mouth daily with supper. 30 tablet 5  . mometasone (NASONEX) 50 MCG/ACT nasal spray Place 2 sprays into the nose daily. (Patient taking differently: Place 2 sprays into the nose 2 (two) times daily. ) 17 g 2   No current facility-administered medications on file prior to visit.     Current Problems (verified) Patient Active Problem List    Diagnosis Date Noted  . OSA on CPAP 06/08/2016  . Chronic diastolic CHF (congestive heart failure) (HCC) 06/08/2016  . 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 Medication management 07/10/2015  . Mixed hyperlipidemia   . Seasonal allergies   . Prediabetes   . Morbid obesity (HCC)   . Vitamin D deficiency   . Pulmonary embolism (HCC)   . Osteoarthritis of right knee 10/05/2012  . Essential hypertension 03/25/2009    Screening Tests Immunization History  Administered Date(s) Administered  . Influenza-Unspecified 03/16/2016  . Tdap 04/28/2015   Preventative care: Last colonoscopy: Dr. Kinnie Scales, 2016 Last mammogram: 12/18/13 Last pap smear/pelvic exam: Pap smear 2016 by patient report, there is no documentation in epic DEXA: 11/2015 Echo 05/2016 Sleep study 01/2016  Prior vaccinations: TD or Tdap: 2016  Influenza: declined  Pneumococcal: Declined Prevnar13:  Declined  Shingles/Zostavax: Declined  Names of Other Physician/Practitioners you currently use: 1. [facility] here for primary care 2. Does not see one, eye doctor, last visit remotely 3. Dr. Dahlia Client, dentist, last visit 2016 Patient Care Team: Lucky Cowboy, MD as PCP - General (Internal Medicine) Kennon Rounds as Physician Assistant (Cardiology) Rollene Rotunda, MD as Consulting Physician (Cardiology) Pa, [facility] as Consulting Physician (Specialist)  Allergies Allergies  Allergen Reactions  . Codeine Other (See Comments)    Unknown  . Hydrocodone     Itch  . Oxycodone Rash  . Tramadol Rash    SURGICAL HISTORY She  has a past surgical history that includes Knee arthroscopy (Right, 12-09-2002); transthoracic echocardiogram (11-17-2010); Total abdominal hysterectomy w/ bilateral salpingoophorectomy (~ 1986); Knee arthroscopy with medial menisectomy (Right, 10/05/2012); and IVC filter placed in May 2014 (N/A). FAMILY HISTORY Her family  history includes Diabetes in her brother; Heart attack in her father and unknown relative; Hypertension in her brother; Stroke in her brother; Sudden death in her brother. SOCIAL HISTORY She  reports that she has never smoked. She has never used smokeless tobacco. She reports that she does not drink alcohol or use drugs.  MEDICARE WELLNESS OBJECTIVES: Tobacco use: She does smoke.  Patient is a former smoker. If yes, counseling given Alcohol Current alcohol use: none Diet: improving Physical activity: Current Exercise Habits: The patient does not participate in regular exercise at present, Exercise limited by: respiratory conditions(s);orthopedic condition(s) Cardiac risk factors: Cardiac Risk Factors include: advanced age (>96men, >61 women);dyslipidemia;hypertension;obesity (BMI >30kg/m2);sedentary lifestyle Depression/mood screen:   Depression screen [facility] 2/9 03/28/2017  Decreased Interest 0  Down, Depressed, Hopeless 0  PHQ - 2 Score 0    ADLs:  In your present state of health, do you have any difficulty performing the following activities: 03/28/2017 06/16/2016  Hearing? N N  Vision? N N  Difficulty concentrating or making decisions? N N  Walking or climbing stairs? Y N  Dressing or bathing? N N  Doing errands, shopping? N N  Some recent data might be hidden     Cognitive Testing  Alert? Yes  Normal Appearance?Yes  Oriented to person? Yes  Place? Yes   Time? Yes  Recall of three objects?  Yes  Can perform simple calculations? Yes  Displays appropriate judgment?Yes  Can read the correct time from a watch face?Yes  EOL planning: Does Patient Have a Medical Advance Directive?: No Would patient like information on creating a medical advance directive?: No - Patient declined (she is in the process of doing it at this time)  Review of Systems  Constitutional: Negative for chills, fever and malaise/fatigue.  HENT: Negative for congestion, ear pain and sore throat.   Eyes:  Negative.   Respiratory: Positive for shortness of breath. Negative for cough, sputum production and wheezing.   Cardiovascular: Negative for chest pain, palpitations and leg swelling.  Gastrointestinal: Negative for abdominal pain, blood in stool, constipation, diarrhea, heartburn and melena.  Genitourinary: Negative.   Skin: Negative.   Neurological: Negative for dizziness, sensory change, loss of consciousness and headaches.  Psychiatric/Behavioral: Negative for depression. The patient is not nervous/anxious and does not have insomnia.      Objective:     Today's Vitals   03/28/17 0920  BP: (!) 158/90  Pulse: 97  Resp: 20  Temp: (!) 97.3 F (36.3 C)  SpO2: 98%  Weight: (!) 313 lb 3.2 oz (142.1 kg)  Height:  (1.575 m)   Body  mass index is 57.29 kg/m.  General appearance: alert, no distress, WD/WN, female HEENT: normocephalic, sclerae anicteric, TMs pearly, nares patent, no discharge or erythema, pharynx normal Oral cavity: MMM, no lesions Neck: supple, no lymphadenopathy, no thyromegaly, no masses Heart: RRR, normal S1, S2, no murmurs Lungs: CTA bilaterally, no wheezes, rhonchi, or rales Abdomen: +bs, soft, morbidly obese, non tender, non distended, no masses, no hepatomegaly, no splenomegaly Musculoskeletal: nontender, no swelling, no obvious deformity Extremities: mild bilateral edema, no cyanosis, no clubbing, dystrophic nails Pulses: 2+ symmetric, upper and lower extremities, normal cap refill Neurological: alert, oriented x 3, CN2-12 intact, strength normal upper extremities and lower extremities, sensation normal throughout, DTRs 2+ throughout, no cerebellar signs, gait normal Psychiatric: normal affect, behavior normal, pleasant   Medicare Attestation I have personally reviewed: The patient's medical and social history Their use of alcohol, tobacco or illicit drugs Their current medications and supplements The patient's functional ability including ADLs,fall  risks, home safety risks, cognitive, and hearing and visual impairment Diet and physical activities Evidence for depression or mood disorders  The patient's weight, height, BMI, and visual acuity have been recorded in the chart.  I have made referrals, counseling, and provided education to the patient based on review of the above and I have provided the patient with a written personalized care plan for preventive services.     Quentin Mulling, PA-C   03/28/2017

## 2017-03-28 ENCOUNTER — Ambulatory Visit (INDEPENDENT_AMBULATORY_CARE_PROVIDER_SITE_OTHER): Payer: Medicare Other | Admitting: Physician Assistant

## 2017-03-28 ENCOUNTER — Encounter: Payer: Self-pay | Admitting: Physician Assistant

## 2017-03-28 VITALS — BP 158/90 | HR 97 | Temp 97.3°F | Resp 20 | Ht 62.0 in | Wt 313.2 lb

## 2017-03-28 DIAGNOSIS — G4733 Obstructive sleep apnea (adult) (pediatric): Secondary | ICD-10-CM | POA: Diagnosis not present

## 2017-03-28 DIAGNOSIS — M1711 Unilateral primary osteoarthritis, right knee: Secondary | ICD-10-CM | POA: Diagnosis not present

## 2017-03-28 DIAGNOSIS — R7303 Prediabetes: Secondary | ICD-10-CM

## 2017-03-28 DIAGNOSIS — Z0000 Encounter for general adult medical examination without abnormal findings: Secondary | ICD-10-CM

## 2017-03-28 DIAGNOSIS — Z0001 Encounter for general adult medical examination with abnormal findings: Secondary | ICD-10-CM

## 2017-03-28 DIAGNOSIS — J302 Other seasonal allergic rhinitis: Secondary | ICD-10-CM

## 2017-03-28 DIAGNOSIS — E669 Obesity, unspecified: Secondary | ICD-10-CM

## 2017-03-28 DIAGNOSIS — E782 Mixed hyperlipidemia: Secondary | ICD-10-CM

## 2017-03-28 DIAGNOSIS — Z79899 Other long term (current) drug therapy: Secondary | ICD-10-CM | POA: Diagnosis not present

## 2017-03-28 DIAGNOSIS — G4736 Sleep related hypoventilation in conditions classified elsewhere: Secondary | ICD-10-CM

## 2017-03-28 DIAGNOSIS — I5032 Chronic diastolic (congestive) heart failure: Secondary | ICD-10-CM | POA: Diagnosis not present

## 2017-03-28 DIAGNOSIS — R6889 Other general symptoms and signs: Secondary | ICD-10-CM | POA: Diagnosis not present

## 2017-03-28 DIAGNOSIS — I10 Essential (primary) hypertension: Secondary | ICD-10-CM | POA: Diagnosis not present

## 2017-03-28 DIAGNOSIS — E559 Vitamin D deficiency, unspecified: Secondary | ICD-10-CM | POA: Diagnosis not present

## 2017-03-28 DIAGNOSIS — I2692 Saddle embolus of pulmonary artery without acute cor pulmonale: Secondary | ICD-10-CM | POA: Diagnosis not present

## 2017-03-28 DIAGNOSIS — Z9989 Dependence on other enabling machines and devices: Secondary | ICD-10-CM

## 2017-03-28 MED ORDER — RIVAROXABAN 20 MG PO TABS: 20.0000 mg | ORAL_TABLET | Freq: Every day | ORAL | 5 refills | Status: DC

## 2017-03-28 MED ORDER — METOPROLOL TARTRATE 50 MG PO TABS: 50.0000 mg | ORAL_TABLET | Freq: Two times a day (BID) | ORAL | 1 refills | Status: DC

## 2017-03-28 MED ORDER — LOSARTAN POTASSIUM 100 MG PO TABS: 100.0000 mg | ORAL_TABLET | Freq: Every day | ORAL | 0 refills | Status: DC

## 2017-03-28 MED ORDER — AMLODIPINE BESYLATE 10 MG PO TABS: 10.0000 mg | ORAL_TABLET | Freq: Every day | ORAL | 0 refills | Status: DC

## 2017-03-28 MED ORDER — ATORVASTATIN CALCIUM 80 MG PO TABS: 40.0000 mg | ORAL_TABLET | Freq: Every evening | ORAL | 0 refills | Status: DC

## 2017-03-28 NOTE — Patient Instructions (Addendum)
[facility]  7 a.m.-6:30 p.m., Monday 7 a.m.-5 p.m., Tuesday-Friday Schedule an appointment by calling [phone].  SCHEDULE MAMMOGRAM  Encourage you to get the 3D Mammogram  The 3D Mammogram is much more specific and sensitive to pick up breast cancer. For women with fibrocystic breast or lumpy breast it can be hard to determine if it is cancer or not but the 3D mammogram is able to tell this difference which cuts back on unneeded additional tests or scary call backs.   - over 40% increase in detection of breast cancer - over 40% reduction in false positives.  - fewer call backs - reduced anxiety - improved outcomes - [facility] M[facility]     Bad carbs also include fruit juice, alcohol, and sweet tea. These are empty calories that do not signal to your brain that you are full.   Please remember the good carbs are still carbs which convert into sugar. So please measure them out no more than 1/2-1 cup of rice, oatmeal, pasta, and beans  Veggies are however free foods! Pile them on.   Not all fruit is created equal. Please see the list below, the fruit at the bottom is higher in sugars than the fruit at the top. Please avoid all dried fruits.      Vitamin D goal is between 60-80  Please make sure that you are taking your Vitamin D as directed.   It is very important as a natural anti-inflammatory   helping hair, skin, and nails, as well as reducing stroke and heart attack risk.   It helps your bones and helps with mood.  We want you on at least 5000 IU daily  It also decreases numerous cancer risks so please take it as directed.   Low Vit D is associated with a 200-300% higher risk for CANCER   and 200-300% higher risk for HEART   ATTACK  &  STROKE.    .....................................Marland Kitchen  It is also associated with higher death rate at younger ages,   autoimmune diseases like Rheumatoid arthritis, Lupus, Multiple Sclerosis.     Also  many other serious conditions, like depression, Alzheimer's  Dementia, infertility, muscle aches, fatigue, fibromyalgia - just to name a few.  +++++++++++++++++++  Can get liquid vitamin D from [place]  OR here in [place] at  [facility] alternatives [address], [place], [place] [address] Or you can try earth fare

## 2017-03-29 LAB — BASIC METABOLIC PANEL WITH GFR
BUN: 16 mg/dL (ref 7–25)
CO2: 29 mmol/L (ref 20–32)
Calcium: 9.5 mg/dL (ref 8.6–10.4)
Chloride: 106 mmol/L (ref 98–110)
Creat: 0.95 mg/dL (ref 0.50–0.99)
GFR, EST AFRICAN AMERICAN: 73 mL/min/{1.73_m2} (ref >=60)
GFR, Est Non African American: 63 mL/min/{1.73_m2} (ref >=60)
GLUCOSE: 132 mg/dL — AB (ref 65–99)
Potassium: 4.5 mmol/L (ref 3.5–5.3)
SODIUM: 142 mmol/L (ref 135–146)

## 2017-03-29 LAB — HEPATIC FUNCTION PANEL
AG Ratio: 1.3 (calc) (ref 1.0–2.5)
ALKALINE PHOSPHATASE (APISO): 108 U/L (ref 33–130)
ALT: 16 U/L (ref 6–29)
AST: 15 U/L (ref 10–35)
Albumin: 3.7 g/dL (ref 3.6–5.1)
BILIRUBIN DIRECT: 0.1 mg/dL (ref 0.0–0.2)
BILIRUBIN INDIRECT: 0.6 mg/dL (ref 0.2–1.2)
Globulin: 2.8 g/dL (calc) (ref 1.9–3.7)
TOTAL PROTEIN: 6.5 g/dL (ref 6.1–8.1)
Total Bilirubin: 0.7 mg/dL (ref 0.2–1.2)

## 2017-03-29 LAB — LIPID PANEL
CHOL/HDL RATIO: 4 (calc) (ref <5.0)
CHOLESTEROL: 226 mg/dL — AB (ref <200)
HDL: 56 mg/dL (ref >50)
LDL CHOLESTEROL (CALC): 156 mg/dL — AB
Non-HDL Cholesterol (Calc): 170 mg/dL (calc) — ABNORMAL HIGH (ref <130)
Triglycerides: 47 mg/dL (ref <150)

## 2017-03-29 LAB — CBC WITH DIFFERENTIAL/PLATELET
BASOS ABS: 62 {cells}/uL (ref 0–200)
Basophils Relative: 1.1 %
EOS PCT: 2 %
Eosinophils Absolute: 112 cells/uL (ref 15–500)
HCT: 42.1 % (ref 35.0–45.0)
HEMOGLOBIN: 13.7 g/dL (ref 11.7–15.5)
Lymphs Abs: 1036 cells/uL (ref 850–3900)
MCH: 29.3 pg (ref 27.0–33.0)
MCHC: 32.5 g/dL (ref 32.0–36.0)
MCV: 90.1 fL (ref 80.0–100.0)
MONOS PCT: 9.9 %
MPV: 10.9 fL (ref 7.5–12.5)
NEUTROS ABS: 3836 {cells}/uL (ref 1500–7800)
Neutrophils Relative %: 68.5 %
PLATELETS: 245 10*3/uL (ref 140–400)
RBC: 4.67 10*6/uL (ref 3.80–5.10)
RDW: 11.4 % (ref 11.0–15.0)
Total Lymphocyte: 18.5 %
WBC mixed population: 554 cells/uL (ref 200–950)
WBC: 5.6 10*3/uL (ref 3.8–10.8)

## 2017-03-29 LAB — HEMOGLOBIN A1C
HEMOGLOBIN A1C: 6 %{Hb} — AB (ref <5.7)
Mean Plasma Glucose: 126 (calc)
eAG (mmol/L): 7 (calc)

## 2017-03-29 LAB — TSH: TSH: 2.66 m[IU]/L (ref 0.40–4.50)

## 2017-03-29 LAB — MAGNESIUM: MAGNESIUM: 2.1 mg/dL (ref 1.5–2.5)

## 2017-03-29 NOTE — Progress Notes (Signed)
LVM for pt to return office call for LAB results.

## 2017-03-30 ENCOUNTER — Other Ambulatory Visit: Payer: Self-pay

## 2017-03-30 ENCOUNTER — Other Ambulatory Visit: Payer: Self-pay | Admitting: Physician Assistant

## 2017-03-30 DIAGNOSIS — E782 Mixed hyperlipidemia: Secondary | ICD-10-CM

## 2017-03-30 DIAGNOSIS — I10 Essential (primary) hypertension: Secondary | ICD-10-CM

## 2017-03-30 MED ORDER — ATORVASTATIN CALCIUM 80 MG PO TABS: 40.0000 mg | ORAL_TABLET | Freq: Every evening | ORAL | 0 refills | Status: DC

## 2017-03-30 MED ORDER — LOSARTAN POTASSIUM 100 MG PO TABS: 100.0000 mg | ORAL_TABLET | Freq: Every day | ORAL | 0 refills | Status: DC

## 2017-03-30 NOTE — Progress Notes (Signed)
Pt aware of lab results & voiced understanding of those results.

## 2017-04-06 ENCOUNTER — Ambulatory Visit: Payer: Medicare Other | Admitting: Adult Health

## 2017-04-07 ENCOUNTER — Ambulatory Visit: Payer: Medicare Other | Admitting: Adult Health

## 2017-04-24 ENCOUNTER — Ambulatory Visit: Payer: Self-pay | Admitting: Adult Health

## 2017-05-29 IMAGING — CR DG CHEST 2V
2 series · 2 of 2 positions shown · non-contrast
Comparison: 04/19/2016 chest radiograph

CLINICAL DATA: 65 y/o  F; shortness of breath.

EXAM:
CHEST  2 VIEW

[w chest lat]
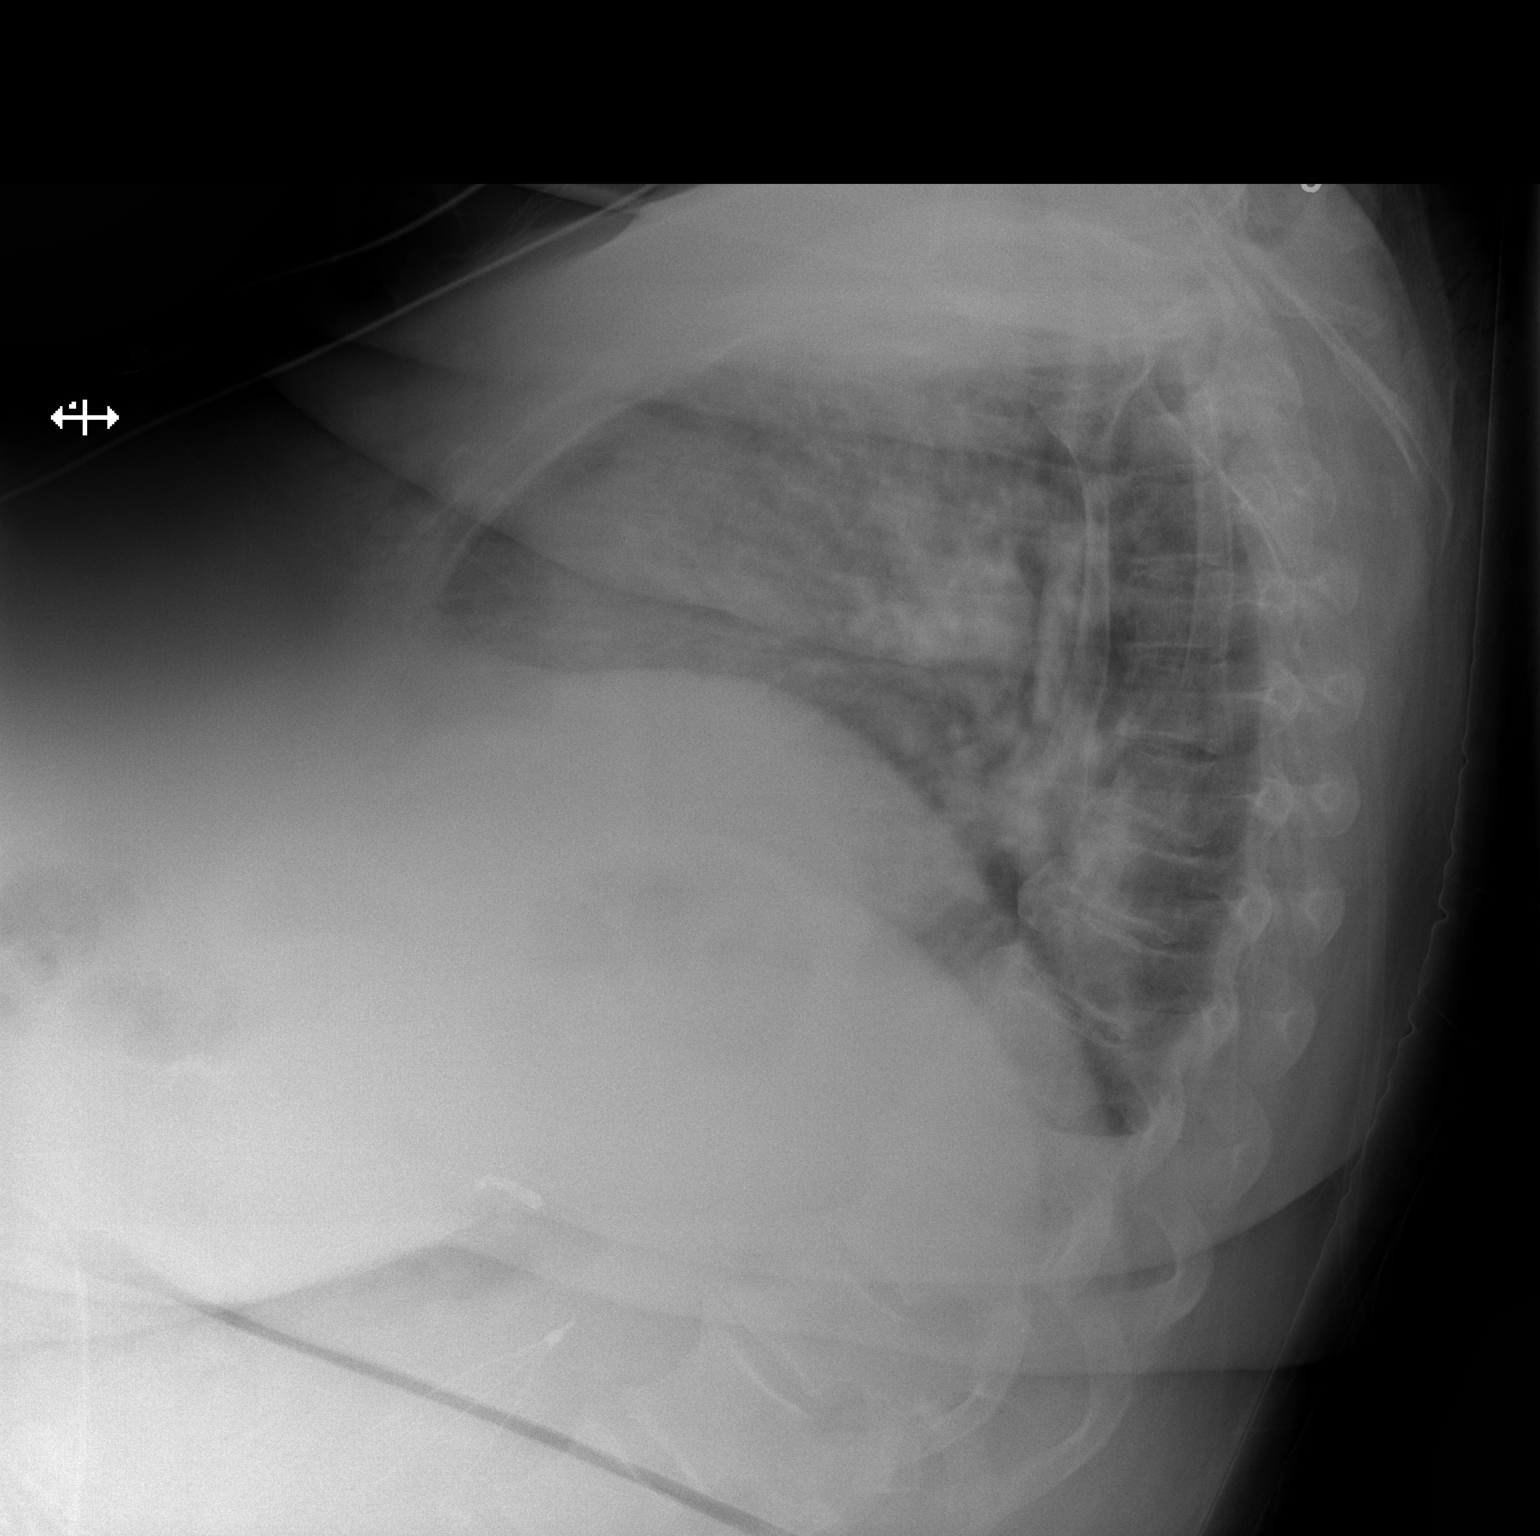

[x chest ap]
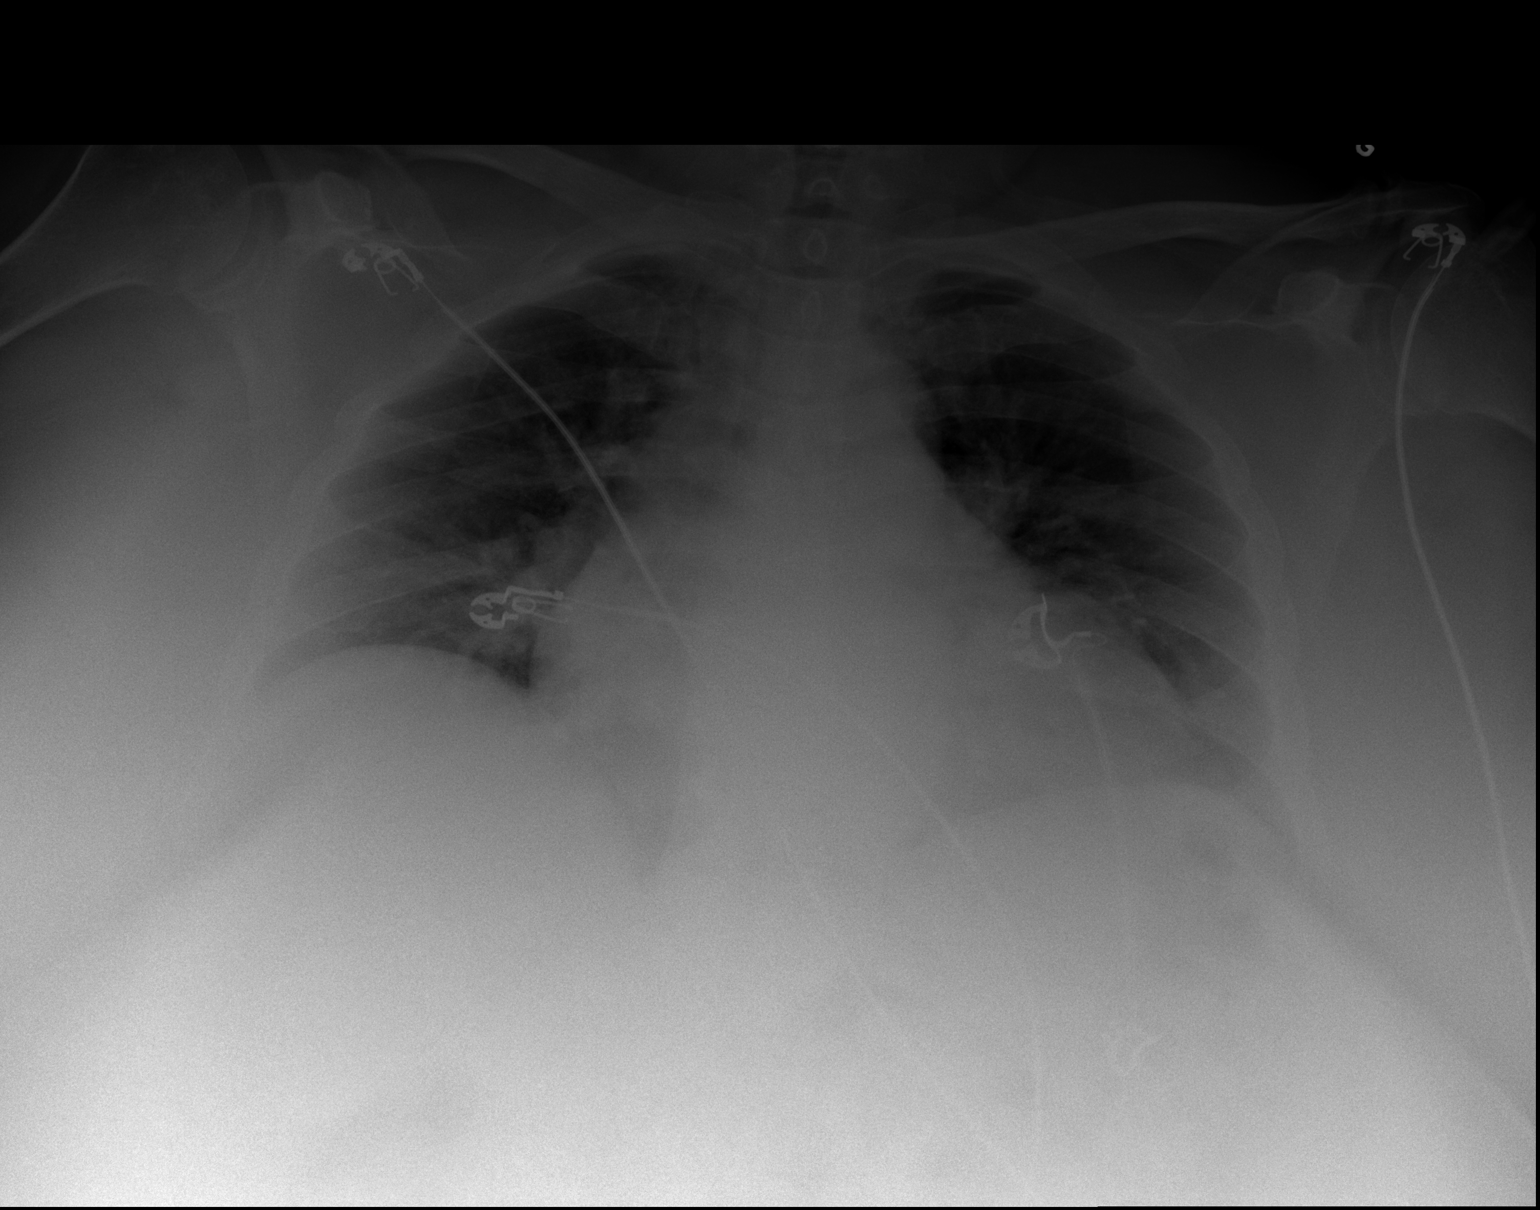

[2 of 2 positions shown; findings below may reference images not displayed]

FINDINGS: Low lung volumes. Stable cardiomegaly given projection and
technique. No focal consolidation. No pleural effusion. Multilevel
degenerative changes of thoracic spine. IVC filter. Upper abdominal
surgical clips, presumably cholecystectomy.
IMPRESSION: Stable cardiomegaly. Low lung volumes. No acute pulmonary process
identified.

By: Det Solleh M.D.

## 2017-07-13 NOTE — Progress Notes (Deleted)
CPE  Assessment:   Essential hypertension - continue medications, DASH diet, exercise and monitor at home. Call if greater than 130/80.  - GET BACK ON METORPOLOL -     metoprolol tartrate (LOPRESSOR) 50 MG tablet; Take 1 tablet (50 mg total) by mouth 2 (two) times daily. -     amLODipine (NORVASC) 10 MG tablet; Take 1 tablet (10 mg total) by mouth daily. -     losartan (COZAAR) 100 MG tablet; Take 1 tablet (100 mg total) by mouth daily. 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 - samples given  Chronic diastolic CHF (congestive heart failure) (HCC) Weight is down, continue weight loss  Mixed hyperlipidemia -     atorvastatin (LIPITOR) 80 MG tablet; Take 0.5 tablets (40 mg total) by mouth every evening. -     Lipid panel - -continue medications, check lipids, decrease fatty foods, increase activity.   Morbid obesity (HCC) - long discussion about weight loss, diet, and exercise  Prediabetes -     Hemoglobin A1c Discussed general issues about diabetes pathophysiology and management., Educational material distributed., Suggested low cholesterol diet., Encouraged aerobic exercise., Discussed foot care., Reminded to get yearly retinal exam.  Medication management -     Magnesium  Vitamin D deficiency Continue supplement  OSA on CPAP Sleep apnea- continue CPAP, CPAP is helping with daytime fatigue, weight loss still advised.   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   Seasonal allergic rhinitis, unspecified trigger - Allegra OTC, increase H20, allergy hygiene explained.   Over 40 minutes of exam, counseling, chart review and critical decision  making was performed  Subjective:  Kerry Liu is a 67 y.o. female who presents for complete physical.    She has had elevated blood pressure, has only been on norvasc and losartan, has not been on metoprolol. Her blood pressure has been controlled at home, today their BP is  .  She has history of recurrent PE ,on xarelto, she has OSA, on CPAP, has diastolic HF. She follows with Dr. Vassie LollAlva.  She does not workout. She denies chest pain, shortness of breath, dizziness.  She is on cholesterol medication and denies myalgias. Her cholesterol is not at goal. The cholesterol last visit was:   Lab Results  Component Value Date   CHOL 226 (H) 03/28/2017   HDL 56 03/28/2017   LDLCALC 84 06/09/2016   TRIG 47 03/28/2017   CHOLHDL 4.0 03/28/2017   She has had prediabetes which has been managed with diet and exercise.  She is not currently on medication.  She has not been working on diet and exercise for prediabetes, and denies foot ulcerations, hyperglycemia, hypoglycemia , increased appetite, nausea, paresthesia of the feet, polydipsia, polyuria, visual disturbances, vomiting and weight loss. Last A1C in the office was:  Lab Results  Component Value Date   HGBA1C 6.0 (H) 03/28/2017   Last GFR: Lab Results  Component Value Date   GFRAA 73 03/28/2017   Patient is on Vitamin D supplement.   Lab Results  Component Value Date   VD25OH 16 (L) 03/01/2016  BMI is There is no height or weight on file to calculate BMI., she is working on diet and exercise. He is on CPAP. She is trying to drive, she is taking care of her grandson, he is 29 he has incontinence due to meningitis.  Wt Readings from Last 3 Encounters:  03/28/17 (!) 313 lb 3.2 oz (142.1 kg)  10/04/16 (!) 321 lb (145.6 kg)  09/07/16 (!) 317 lb (143.8 kg)     Medication Review: Current Outpatient Medications on File Prior to Visit  Medication Sig  . acetaminophen-codeine (TYLENOL #3) 300-30 MG tablet Take 1 tablet by mouth every 6  (six) hours as needed for moderate pain.  Marland Kitchen albuterol (PROVENTIL) (2.5 MG/3ML) 0.083% nebulizer solution Take 3 mLs (2.5 mg total) by nebulization every 6 (six) hours as needed for wheezing or shortness of breath.  Marland Kitchen amLODipine (NORVASC) 10 MG tablet Take 1 tablet (10 mg total) by mouth daily.  Marland Kitchen atorvastatin (LIPITOR) 80 MG tablet Take 0.5 tablets (40 mg total) by mouth every evening.  . Cholecalciferol (VITAMIN D3) 5000 UNITS CAPS Take 1 capsule by mouth daily.  . furosemide (LASIX) 40 MG tablet Take 0.5 tablets (20 mg total) by mouth daily.  Marland Kitchen losartan (COZAAR) 100 MG tablet Take 1 tablet (100 mg total) by mouth daily.  . Magnesium Oxide 250 MG TABS Take 1 tablet by mouth daily as needed (constipation).   . metoprolol tartrate (LOPRESSOR) 50 MG tablet Take 1 tablet (50 mg total) by mouth 2 (two) times daily.  . mometasone (NASONEX) 50 MCG/ACT nasal spray Place 2 sprays into the nose daily. (Patient taking differently: Place 2 sprays into the nose 2 (two) times daily. )  . nystatin cream (MYCOSTATIN) Apply 1 application topically 2 (two) times daily.  . rivaroxaban (XARELTO) 20 MG TABS tablet Take 1 tablet (20 mg total) by mouth daily with supper.   No current facility-administered medications on file prior to visit.     Current Problems (verified) Patient Active Problem List   Diagnosis Date Noted  . OSA on CPAP 06/08/2016  . Chronic diastolic CHF (congestive heart failure) (HCC) 06/08/2016  . 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 Medication management 07/10/2015  . Mixed hyperlipidemia   . Seasonal allergies   . Prediabetes   . Morbid obesity (HCC)   . Vitamin D deficiency   . History of pulmonary embolus (PE)   . Osteoarthritis of right knee 10/05/2012  . Essential hypertension 03/25/2009    Screening Tests Immunization History  Administered Date(s) Administered  . Influenza-Unspecified 03/16/2016  . Tdap 04/28/2015   Preventative care: Last colonoscopy: Dr.  Kinnie Scales, 2016 Last mammogram: 12/18/13 Last pap smear/pelvic exam: Pap smear 2016 by patient report, there is no documentation in epic DEXA: 11/2015 Echo 05/2016 Sleep study 01/2016  Prior vaccinations: TD or Tdap: 2016  Influenza: declined  Pneumococcal: Declined Prevnar13:  Declined Shingles/Zostavax: Declined  Names of Other Physician/Practitioners you currently use: 1. [facility] here for primary care 2. Does not see one, eye doctor, last visit remotely 3. Dr. Dahlia Client, dentist, last visit 2016 Patient Care Team: Lucky Cowboy, MD as PCP - General (Internal Medicine) Kennon Rounds as Physician Assistant (Cardiology) Rollene Rotunda, MD as Consulting Physician (Cardiology) Pa, [facility] as Consulting Physician (Specialist)  Allergies Allergies  Allergen Reactions  . Codeine Other (See Comments)    Unknown  . Hydrocodone     Itch  . Oxycodone Rash  . Tramadol Rash  SURGICAL HISTORY She  has a past surgical history that includes Knee arthroscopy (Right, 12-09-2002); transthoracic echocardiogram (11-17-2010); Total abdominal hysterectomy w/ bilateral salpingoophorectomy (~ 1986); Knee arthroscopy with medial menisectomy (Right, 10/05/2012); and IVC filter placed in May 2014 (N/A). FAMILY HISTORY Her family history includes Diabetes in her brother; Heart attack in her father and unknown relative; Hypertension in her brother; Stroke in her brother; Sudden death in her brother. SOCIAL HISTORY She  reports that  has never smoked. she has never used smokeless tobacco. She reports that she does not drink alcohol or use drugs.  Review of Systems  Constitutional: Negative for chills, fever and malaise/fatigue.  HENT: Negative for congestion, ear pain and sore throat.   Eyes: Negative.   Respiratory: Positive for shortness of breath. Negative for cough, sputum production and wheezing.   Cardiovascular: Negative for  chest pain, palpitations and leg swelling.  Gastrointestinal: Negative for abdominal pain, blood in stool, constipation, diarrhea, heartburn and melena.  Genitourinary: Negative.   Skin: Negative.   Neurological: Negative for dizziness, sensory change, loss of consciousness and headaches.  Psychiatric/Behavioral: Negative for depression. The patient is not nervous/anxious and does not have insomnia.      Objective:     There were no vitals filed for this visit. There is no height or weight on file to calculate BMI.  General appearance: alert, no distress, WD/WN, female HEENT: normocephalic, sclerae anicteric, TMs pearly, nares patent, no discharge or erythema, pharynx normal Oral cavity: MMM, no lesions Neck: supple, no lymphadenopathy, no thyromegaly, no masses Heart: RRR, normal S1, S2, no murmurs Lungs: CTA bilaterally, no wheezes, rhonchi, or rales Abdomen: +bs, soft, morbidly obese, non tender, non distended, no masses, no hepatomegaly, no splenomegaly Musculoskeletal: nontender, no swelling, no obvious deformity Extremities: mild bilateral edema, no cyanosis, no clubbing, dystrophic nails Pulses: 2+ symmetric, upper and lower extremities, normal cap refill Neurological: alert, oriented x 3, CN2-12 intact, strength normal upper extremities and lower extremities, sensation normal throughout, DTRs 2+ throughout, no cerebellar signs, gait normal Psychiatric: normal affect, behavior normal, pleasant     Quentin Mulling, PA-C   07/13/2017

## 2017-07-14 ENCOUNTER — Encounter: Payer: Self-pay | Admitting: Physician Assistant

## 2017-07-28 ENCOUNTER — Telehealth: Payer: Self-pay

## 2017-07-28 NOTE — Telephone Encounter (Signed)
INFORMED that XARELTO samples would be up front for pick up. Feb 1st 2019 by DD

## 2017-08-08 NOTE — Progress Notes (Signed)
CPE  Assessment:   Essential hypertension - continue medications, DASH diet, exercise and monitor at home. Call if greater than 130/80.  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 - samples given  Chronic diastolic CHF (congestive heart failure) (HCC) Weight is down, continue weight loss  Mixed hyperlipidemia -     atorvastatin (LIPITOR) 80 MG tablet; Take 0.5 tablets (40 mg total) by mouth every evening. -     Lipid panel - -continue medications, check lipids, decrease fatty foods, increase activity.   Morbid obesity (HCC) - long discussion about weight loss, diet, and exercise  Prediabetes -     Hemoglobin A1c Discussed general issues about diabetes pathophysiology and management., Educational material distributed., Suggested low cholesterol diet., Encouraged aerobic exercise., Discussed foot care., Reminded to get yearly retinal exam.  Medication management -     Magnesium  Vitamin D deficiency Continue supplement  OSA on CPAP Sleep apnea- continue CPAP, not using nightly, counseled on nightly use at least 4-5 hours, CPAP is helping with daytime fatigue, weight loss still advised.   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   Seasonal allergic rhinitis, unspecified trigger - Allegra OTC, increase H20, allergy hygiene explained.  Declines a lot of labs due to deductible  Over 40 minutes of exam, counseling, chart review and critical decision making was performed  Future Appointments  Date Time Provider Department Center  12/11/2017 11:00 AM Judd Gaudier, NP [facility] None     Subjective:  Kerry Liu is a 67 y.o. female who presents for complete  physical.    She has had elevated blood pressure, has only been on norvasc, lasix, metoprolol and she is off the losartan.  Her blood pressure is NOT at goal, today their BP is BP: (!) 138/92.  She has history of recurrent PE ,on xarelto, she has OSA, on CPAP 2-3 times a week she uses, has diastolic HF. She follows with Dr. Vassie Loll.  She does not workout. She denies chest pain, shortness of breath, dizziness.  She is on cholesterol medication and denies myalgias. Her cholesterol is not at goal. The cholesterol last visit was:   Lab Results  Component Value Date   CHOL 226 (H) 03/28/2017   HDL 56 03/28/2017   LDLCALC 84 06/09/2016   TRIG 47 03/28/2017   CHOLHDL 4.0 03/28/2017   She has had prediabetes which has been managed with diet and exercise.  She is not currently on medication.  She has not been working on diet and exercise for prediabetes, and denies foot ulcerations, hyperglycemia, hypoglycemia , increased appetite, nausea, paresthesia of the feet, polydipsia, polyuria, visual disturbances, vomiting and weight loss. Last A1C in the office was:  Lab Results  Component Value Date   HGBA1C 6.0 (H) 03/28/2017   Last GFR: Lab Results  Component Value Date   GFRAA 73 03/28/2017   Patient is on Vitamin D supplement.   Lab Results  Component Value Date   VD25OH 16 (L) 03/01/2016     BMI is Body mass index is 57.93 kg/m., she is working on diet and exercise. He is  on CPAP. She is trying to drive, she is taking care of her grandson he has someone come out to the house 5 days a week for 3-4 hours which is helpgin, he is 26 he has incontinence due to meningitis.  Wt Readings from Last 3 Encounters:  08/10/17 (!) 316 lb 11.2 oz (143.7 kg)  03/28/17 (!) 313 lb 3.2 oz (142.1 kg)  10/04/16 (!) 321 lb (145.6 kg)     Medication Review: Current Outpatient Medications on File Prior to Visit  Medication Sig  . acetaminophen-codeine (TYLENOL #3) 300-30 MG tablet Take 1 tablet by mouth every 6  (six) hours as needed for moderate pain.  Marland Kitchen. albuterol (PROVENTIL) (2.5 MG/3ML) 0.083% nebulizer solution Take 3 mLs (2.5 mg total) by nebulization every 6 (six) hours as needed for wheezing or shortness of breath.  Marland Kitchen. amLODipine (NORVASC) 10 MG tablet Take 1 tablet (10 mg total) by mouth daily.  Marland Kitchen. atorvastatin (LIPITOR) 80 MG tablet Take 0.5 tablets (40 mg total) by mouth every evening.  . Cholecalciferol (VITAMIN D3) 5000 UNITS CAPS Take 1 capsule by mouth daily.  . furosemide (LASIX) 40 MG tablet Take 0.5 tablets (20 mg total) by mouth daily.  Marland Kitchen. losartan (COZAAR) 100 MG tablet Take 1 tablet (100 mg total) by mouth daily.  . Magnesium Oxide 250 MG TABS Take 1 tablet by mouth daily as needed (constipation).   . metoprolol tartrate (LOPRESSOR) 50 MG tablet Take 1 tablet (50 mg total) by mouth 2 (two) times daily.  Marland Kitchen. nystatin cream (MYCOSTATIN) Apply 1 application topically 2 (two) times daily.  . rivaroxaban (XARELTO) 20 MG TABS tablet Take 1 tablet (20 mg total) by mouth daily with supper.  . mometasone (NASONEX) 50 MCG/ACT nasal spray Place 2 sprays into the nose daily. (Patient taking differently: Place 2 sprays into the nose 2 (two) times daily. )   No current facility-administered medications on file prior to visit.     Current Problems (verified) Patient Active Problem List   Diagnosis Date Noted  . OSA on CPAP 06/08/2016  . Chronic diastolic CHF (congestive heart failure) (HCC) 06/08/2016  . 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 Medication management 07/10/2015  . Mixed hyperlipidemia   . Seasonal allergies   . Prediabetes   . Morbid obesity (HCC)   . Vitamin D deficiency   . History of pulmonary embolus (PE)   . Osteoarthritis of right knee 10/05/2012  . Essential hypertension 03/25/2009    Screening Tests Immunization History  Administered Date(s) Administered  . Influenza-Unspecified 03/16/2016  . Tdap 04/28/2015   Preventative care: Last colonoscopy: Dr.  Kinnie ScalesMedoff, 2016 Last mammogram: 12/18/13 OVERDUE Last pap smear/pelvic exam: Pap smear 2016 by patient report, there is no documentation in epic CXR 05/2016 DEXA: 11/2015 Echo 05/2016 Sleep study 01/2016  Prior vaccinations: TD or Tdap: 2016  Influenza: declined  Pneumococcal: Declined Prevnar13:  Declined Shingles/Zostavax: Declined  Names of Other Physician/Practitioners you currently use: 1. [facility] here for primary care 2. Does not see one, eye doctor, last visit remotely 3. Dr. Dahlia ClientBrowning, dentist, last visit 2016 Patient Care Team: Lucky CowboyMcKeown, William, MD as PCP - General (Internal Medicine) Kennon RoundsWeaver, Scott T, PA-C as Physician Assistant (Cardiology) Rollene RotundaHochrein, James, MD as Consulting Physician (Cardiology) Pa, [facility] as Consulting Physician (Specialist)  Allergies Allergies  Allergen Reactions  . Codeine Other (See Comments)    Unknown  . Hydrocodone     Itch  . Oxycodone Rash  . Tramadol Rash  SURGICAL HISTORY She  has a past surgical history that includes Knee arthroscopy (Right, 12-09-2002); transthoracic echocardiogram (11-17-2010); Total abdominal hysterectomy w/ bilateral salpingoophorectomy (~ 1986); Knee arthroscopy with medial menisectomy (Right, 10/05/2012); and IVC filter placed in May 2014 (N/A). FAMILY HISTORY Her family history includes Diabetes in her brother; Heart attack in her father and unknown relative; Hypertension in her brother; Stroke in her brother; Sudden death in her brother. SOCIAL HISTORY She  reports that  has never smoked. she has never used smokeless tobacco. She reports that she does not drink alcohol or use drugs.  Review of Systems  Constitutional: Negative for chills, fever and malaise/fatigue.  HENT: Negative for congestion, ear pain and sore throat.   Eyes: Negative.   Respiratory: Positive for shortness of breath. Negative for cough, sputum production and wheezing.    Cardiovascular: Negative for chest pain, palpitations and leg swelling.  Gastrointestinal: Negative for abdominal pain, blood in stool, constipation, diarrhea, heartburn and melena.  Genitourinary: Negative.   Skin: Negative.   Neurological: Negative for dizziness, sensory change, loss of consciousness and headaches.  Psychiatric/Behavioral: Negative for depression. The patient is not nervous/anxious and does not have insomnia.      Objective:     Today's Vitals   08/10/17 1012  BP: (!) 138/92  Pulse: (!) 101  Resp: 18  Temp: 97.6 F (36.4 C)  SpO2: 95%  Weight: (!) 316 lb 11.2 oz (143.7 kg)  Height: 5\' 2"  (1.575 m)   Body mass index is 57.93 kg/m.  General appearance: alert, no distress, WD/WN, female HEENT: normocephalic, sclerae anicteric, TMs pearly, nares patent, no discharge or erythema, pharynx normal Oral cavity: MMM, no lesions Neck: supple, no lymphadenopathy, no thyromegaly, no masses Heart: RRR, normal S1, S2, no murmurs Lungs: CTA bilaterally, no wheezes, rhonchi, or rales Abdomen: +bs, soft, morbidly obese, non tender, non distended, no masses, no hepatomegaly, no splenomegaly Musculoskeletal: nontender, no swelling, no obvious deformity Extremities: mild bilateral edema, no cyanosis, no clubbing, dystrophic nails Pulses: 2+ symmetric, upper and lower extremities, normal cap refill Neurological: alert, oriented x 3, CN2-12 intact, strength normal upper extremities and lower extremities, sensation normal throughout, DTRs 2+ throughout, no cerebellar signs, gait antalgic Psychiatric: normal affect, behavior normal, pleasant     Quentin Mulling, PA-C   08/10/2017

## 2017-08-10 ENCOUNTER — Encounter: Payer: Self-pay | Admitting: Physician Assistant

## 2017-08-10 ENCOUNTER — Ambulatory Visit (INDEPENDENT_AMBULATORY_CARE_PROVIDER_SITE_OTHER): Payer: Medicare Other | Admitting: Physician Assistant

## 2017-08-10 VITALS — BP 138/92 | HR 101 | Temp 97.6°F | Resp 18 | Ht 62.0 in | Wt 316.7 lb

## 2017-08-10 DIAGNOSIS — I10 Essential (primary) hypertension: Secondary | ICD-10-CM | POA: Diagnosis not present

## 2017-08-10 DIAGNOSIS — E559 Vitamin D deficiency, unspecified: Secondary | ICD-10-CM | POA: Diagnosis not present

## 2017-08-10 DIAGNOSIS — I2692 Saddle embolus of pulmonary artery without acute cor pulmonale: Secondary | ICD-10-CM | POA: Diagnosis not present

## 2017-08-10 DIAGNOSIS — G4733 Obstructive sleep apnea (adult) (pediatric): Secondary | ICD-10-CM | POA: Diagnosis not present

## 2017-08-10 DIAGNOSIS — E669 Obesity, unspecified: Secondary | ICD-10-CM

## 2017-08-10 DIAGNOSIS — I5032 Chronic diastolic (congestive) heart failure: Secondary | ICD-10-CM

## 2017-08-10 DIAGNOSIS — Z86711 Personal history of pulmonary embolism: Secondary | ICD-10-CM | POA: Diagnosis not present

## 2017-08-10 DIAGNOSIS — J302 Other seasonal allergic rhinitis: Secondary | ICD-10-CM | POA: Diagnosis not present

## 2017-08-10 DIAGNOSIS — Z9989 Dependence on other enabling machines and devices: Secondary | ICD-10-CM

## 2017-08-10 DIAGNOSIS — Z0001 Encounter for general adult medical examination with abnormal findings: Secondary | ICD-10-CM

## 2017-08-10 DIAGNOSIS — G4736 Sleep related hypoventilation in conditions classified elsewhere: Secondary | ICD-10-CM

## 2017-08-10 DIAGNOSIS — Z79899 Other long term (current) drug therapy: Secondary | ICD-10-CM | POA: Diagnosis not present

## 2017-08-10 DIAGNOSIS — R7303 Prediabetes: Secondary | ICD-10-CM | POA: Diagnosis not present

## 2017-08-10 DIAGNOSIS — E782 Mixed hyperlipidemia: Secondary | ICD-10-CM

## 2017-08-10 MED ORDER — ATORVASTATIN CALCIUM 80 MG PO TABS: 40.0000 mg | ORAL_TABLET | Freq: Every evening | ORAL | 0 refills | Status: DC

## 2017-08-10 MED ORDER — OLMESARTAN MEDOXOMIL 20 MG PO TABS: ORAL_TABLET | ORAL | 1 refills | Status: DC

## 2017-08-10 NOTE — Patient Instructions (Addendum)
When it comes to diets, agreement about the perfect plan isn't easy to find, even among the experts. Experts at the [facility] [employer] developed an idea known as the Healthy Eating Plate. Just imagine a plate divided into logical, healthy portions.  The emphasis is on diet quality:  Load up on vegetables and fruits - one-half of your plate: Aim for color and variety, and remember that potatoes don't count.  Go for whole grains - one-quarter of your plate: Whole wheat, barley, wheat berries, quinoa, oats, brown rice, and foods made with them. If you want pasta, go with whole wheat pasta.  Protein power - one-quarter of your plate: Fish, chicken, beans, and nuts are all healthy, versatile protein sources. Limit red meat.  The diet, however, does go beyond the plate, offering a few other suggestions.  Use healthy plant oils, such as olive, canola, soy, corn, sunflower and peanut. Check the labels, and avoid partially hydrogenated oil, which have unhealthy trans fats.  If you're thirsty, drink water. Coffee and tea are good in moderation, but skip sugary drinks and limit milk and dairy products to one or two daily servings.  The type of carbohydrate in the diet is more important than the amount. Some sources of carbohydrates, such as vegetables, fruits, whole grains, and beans-are healthier than others.  Finally, stay active.   Add on benicar 1/2 pill daily for your blood pressure Goal is 120/70  Monitor your blood pressure at home. Go to the ER if any CP, SOB, nausea, dizziness, severe HA, changes vision/speech  Goal BP:  For patients younger than 60: Goal BP < 140/90. For patients 60 and older: Goal BP < 150/90. For patients with diabetes: Goal BP < 140/90. Your most recent BP: BP: (!) 138/92   Take your medications faithfully as instructed. Maintain a healthy weight. Get at least 150 minutes of aerobic exercise per week. Minimize salt intake. Minimize alcohol  intake  DASH Eating Plan DASH stands for "Dietary Approaches to Stop Hypertension." The DASH eating plan is a healthy eating plan that has been shown to reduce high blood pressure (hypertension). Additional health benefits may include reducing the risk of type 2 diabetes mellitus, heart disease, and stroke. The DASH eating plan may also help with weight loss. WHAT DO I NEED TO KNOW ABOUT THE DASH EATING PLAN? For the DASH eating plan, you will follow these general guidelines:  Choose foods with a percent daily value for sodium of less than 5% (as listed on the food label).  Use salt-free seasonings or herbs instead of table salt or sea salt.  Check with your health care provider or pharmacist before using salt substitutes.  Eat lower-sodium products, often labeled as "lower sodium" or "no salt added."  Eat fresh foods.  Eat more vegetables, fruits, and low-fat dairy products.  Choose whole grains. Look for the word "whole" as the first word in the ingredient list.  Choose fish and skinless chicken or [place] more often than red meat. Limit fish, poultry, and meat to 6 oz (170 g) each day.  Limit sweets, desserts, sugars, and sugary drinks.  Choose heart-healthy fats.  Limit cheese to 1 oz (28 g) per day.  Eat more home-cooked food and less restaurant, buffet, and fast food.  Limit fried foods.  Cook foods using methods other than frying.  Limit canned vegetables. If you do use them, rinse them well to decrease the sodium.  When eating at a restaurant, ask that  your food be prepared with less salt, or no salt if possible. WHAT FOODS CAN I EAT? Seek help from a dietitian for individual calorie needs. Grains Whole grain or whole wheat bread. Brown rice. Whole grain or whole wheat pasta. Quinoa, bulgur, and whole grain cereals. Low-sodium cereals. Corn or whole wheat flour tortillas. Whole grain cornbread. Whole grain crackers. Low-sodium crackers. Vegetables Fresh or frozen  vegetables (raw, steamed, roasted, or grilled). Low-sodium or reduced-sodium tomato and vegetable juices. Low-sodium or reduced-sodium tomato sauce and paste. Low-sodium or reduced-sodium canned vegetables.  Fruits All fresh, canned (in natural juice), or frozen fruits. Meat and Other Protein Products Ground beef (85% or leaner), grass-fed beef, or beef trimmed of fat. Skinless chicken or [place]. Ground chicken or [place]. Pork trimmed of fat. All fish and seafood. Eggs. Dried beans, peas, or lentils. Unsalted nuts and seeds. Unsalted canned beans. Dairy Low-fat dairy products, such as skim or 1% milk, 2% or reduced-fat cheeses, low-fat ricotta or cottage cheese, or plain low-fat yogurt. Low-sodium or reduced-sodium cheeses. Fats and Oils Tub margarines without trans fats. Light or reduced-fat mayonnaise and salad dressings (reduced sodium). Avocado. Safflower, olive, or canola oils. Natural peanut or almond butter. Other Unsalted popcorn and pretzels. The items listed above may not be a complete list of recommended foods or beverages. Contact your dietitian for more options. WHAT FOODS ARE NOT RECOMMENDED? Grains White bread. White pasta. White rice. Refined cornbread. Bagels and croissants. Crackers that contain trans fat. Vegetables Creamed or fried vegetables. Vegetables in a cheese sauce. Regular canned vegetables. Regular canned tomato sauce and paste. Regular tomato and vegetable juices. Fruits Dried fruits. Canned fruit in light or heavy syrup. Fruit juice. Meat and Other Protein Products Fatty cuts of meat. Ribs, chicken wings, bacon, sausage, bologna, salami, chitterlings, fatback, hot dogs, bratwurst, and packaged luncheon meats. Salted nuts and seeds. Canned beans with salt. Dairy Whole or 2% milk, cream, half-and-half, and cream cheese. Whole-fat or sweetened yogurt. Full-fat cheeses or blue cheese. Nondairy creamers and whipped toppings. Processed cheese, cheese spreads, or cheese  curds. Condiments Onion and garlic salt, seasoned salt, table salt, and sea salt. Canned and packaged gravies. Worcestershire sauce. Tartar sauce. Barbecue sauce. Teriyaki sauce. Soy sauce, including reduced sodium. Steak sauce. Fish sauce. Oyster sauce. Cocktail sauce. Horseradish. Ketchup and mustard. Meat flavorings and tenderizers. Bouillon cubes. Hot sauce. Tabasco sauce. Marinades. Taco seasonings. Relishes. Fats and Oils Butter, stick margarine, lard, shortening, ghee, and bacon fat. Coconut, palm kernel, or palm oils. Regular salad dressings. Other Pickles and olives. Salted popcorn and pretzels. The items listed above may not be a complete list of foods and beverages to avoid. Contact your dietitian for more information. WHERE CAN I FIND MORE INFORMATION? National Heart, Lung, and Blood Institute: CableProm[employer].itwww.nhlbi.nih.gov/health/health-topics/topics/dash/ Document Released: 06/02/2011 Document Revised: 10/28/2013 Document Reviewed: 04/17/2013 [facility] Patient Information 2015 [place] [place]. This information is not intended to replace advice given to you by your health care provider. Make sure you discuss any questions you have with your health care provider.   Vitamin D goal is between 60-80  Please make sure that you are taking your Vitamin D as directed.   It is very important as a natural anti-inflammatory   helping hair, skin, and nails, as well as reducing stroke and heart attack risk.   It helps your bones and helps with mood.  We want you on at least 5000 IU daily  It also decreases numerous cancer risks so please take it as directed.   Low  Vit D is associated with a 200-300% higher risk for CANCER   and 200-300% higher risk for HEART   ATTACK  &  STROKE.    .....................................Marland Kitchen  It is also associated with higher death rate at younger ages,   autoimmune diseases like Rheumatoid arthritis, Lupus, Multiple Sclerosis.     Also many other serious  conditions, like depression, Alzheimer's  Dementia, infertility, muscle aches, fatigue, fibromyalgia - just to name a few.  +++++++++++++++++++  Can get liquid vitamin D from [facility]  OR here in [place] at  [facility] alternatives [address] [place], [place] [address] Or you can try earth fare

## 2017-08-11 LAB — URINALYSIS, ROUTINE W REFLEX MICROSCOPIC
BACTERIA UA: NONE SEEN /HPF
Bilirubin Urine: NEGATIVE
Glucose, UA: NEGATIVE
HGB URINE DIPSTICK: NEGATIVE
Hyaline Cast: NONE SEEN /LPF
Ketones, ur: NEGATIVE
Nitrite: NEGATIVE
Protein, ur: NEGATIVE
SPECIFIC GRAVITY, URINE: 1.024 (ref 1.001–1.03)
pH: 7 (ref 5.0–8.0)

## 2017-08-11 LAB — HEPATIC FUNCTION PANEL
AG RATIO: 1.4 (calc) (ref 1.0–2.5)
ALBUMIN MSPROF: 3.7 g/dL (ref 3.6–5.1)
ALKALINE PHOSPHATASE (APISO): 121 U/L (ref 33–130)
ALT: 18 U/L (ref 6–29)
AST: 17 U/L (ref 10–35)
Bilirubin, Direct: 0.2 mg/dL (ref 0.0–0.2)
GLOBULIN: 2.6 g/dL (ref 1.9–3.7)
Indirect Bilirubin: 0.6 mg/dL (calc) (ref 0.2–1.2)
TOTAL PROTEIN: 6.3 g/dL (ref 6.1–8.1)
Total Bilirubin: 0.8 mg/dL (ref 0.2–1.2)

## 2017-08-11 LAB — CBC WITH DIFFERENTIAL/PLATELET
BASOS ABS: 48 {cells}/uL (ref 0–200)
BASOS PCT: 0.9 %
EOS ABS: 122 {cells}/uL (ref 15–500)
Eosinophils Relative: 2.3 %
HCT: 40.4 % (ref 35.0–45.0)
HEMOGLOBIN: 13.2 g/dL (ref 11.7–15.5)
Lymphs Abs: 1023 cells/uL (ref 850–3900)
MCH: 28.5 pg (ref 27.0–33.0)
MCHC: 32.7 g/dL (ref 32.0–36.0)
MCV: 87.3 fL (ref 80.0–100.0)
MPV: 10.7 fL (ref 7.5–12.5)
Monocytes Relative: 10.4 %
NEUTROS ABS: 3556 {cells}/uL (ref 1500–7800)
Neutrophils Relative %: 67.1 %
Platelets: 252 10*3/uL (ref 140–400)
RBC: 4.63 10*6/uL (ref 3.80–5.10)
RDW: 11.5 % (ref 11.0–15.0)
Total Lymphocyte: 19.3 %
WBC: 5.3 10*3/uL (ref 3.8–10.8)
WBCMIX: 551 {cells}/uL (ref 200–950)

## 2017-08-11 LAB — MICROALBUMIN / CREATININE URINE RATIO
Creatinine, Urine: 155 mg/dL (ref 20–275)
MICROALB UR: 0.5 mg/dL
Microalb Creat Ratio: 3 mcg/mg creat (ref <30)

## 2017-08-11 LAB — BASIC METABOLIC PANEL WITH GFR
BUN: 16 mg/dL (ref 7–25)
CO2: 30 mmol/L (ref 20–32)
CREATININE: 0.99 mg/dL (ref 0.50–0.99)
Calcium: 9.3 mg/dL (ref 8.6–10.4)
Chloride: 105 mmol/L (ref 98–110)
GFR, Est African American: 70 mL/min/{1.73_m2} (ref >=60)
GFR, Est Non African American: 60 mL/min/{1.73_m2} (ref >=60)
GLUCOSE: 117 mg/dL — AB (ref 65–99)
Potassium: 4 mmol/L (ref 3.5–5.3)
Sodium: 142 mmol/L (ref 135–146)

## 2017-08-11 LAB — LIPID PANEL
CHOL/HDL RATIO: 2.8 (calc) (ref <5.0)
Cholesterol: 148 mg/dL (ref <200)
HDL: 52 mg/dL (ref >50)
LDL CHOLESTEROL (CALC): 83 mg/dL
NON-HDL CHOLESTEROL (CALC): 96 mg/dL (ref <130)
TRIGLYCERIDES: 44 mg/dL (ref <150)

## 2017-08-14 ENCOUNTER — Other Ambulatory Visit: Payer: Self-pay

## 2017-08-14 DIAGNOSIS — E782 Mixed hyperlipidemia: Secondary | ICD-10-CM

## 2017-08-14 DIAGNOSIS — I10 Essential (primary) hypertension: Secondary | ICD-10-CM

## 2017-08-14 MED ORDER — METOPROLOL TARTRATE 50 MG PO TABS: 50.0000 mg | ORAL_TABLET | Freq: Two times a day (BID) | ORAL | 1 refills | Status: DC

## 2017-08-14 MED ORDER — ATORVASTATIN CALCIUM 80 MG PO TABS: 40.0000 mg | ORAL_TABLET | Freq: Every evening | ORAL | 1 refills | Status: DC

## 2017-11-01 ENCOUNTER — Telehealth: Payer: Self-pay

## 2017-11-01 NOTE — Telephone Encounter (Signed)
Patient is requesting samples for a blood thinner medication; Xarelto. Last office visit on 08/10/17. Currently does not have an upcoming appointment. Please advise

## 2017-11-02 NOTE — Telephone Encounter (Signed)
LMTCB

## 2017-11-03 ENCOUNTER — Telehealth: Payer: Self-pay

## 2017-11-03 NOTE — Telephone Encounter (Signed)
Pt reports she is all out of her Xarelto. And is very nervous about being without it over the wkend. We had a few sample bottles so they were put up for the pt. I told the pt that she needs to return the Patient Asst program so that we can get her some help with paying for the XARELTO. Pt voiced understanding & hung up.

## 2017-11-30 ENCOUNTER — Encounter: Payer: Self-pay | Admitting: Physician Assistant

## 2017-12-08 ENCOUNTER — Other Ambulatory Visit: Payer: Self-pay

## 2017-12-08 DIAGNOSIS — I10 Essential (primary) hypertension: Secondary | ICD-10-CM

## 2017-12-08 DIAGNOSIS — E782 Mixed hyperlipidemia: Secondary | ICD-10-CM

## 2017-12-08 MED ORDER — ATORVASTATIN CALCIUM 80 MG PO TABS: 40.0000 mg | ORAL_TABLET | Freq: Every evening | ORAL | 1 refills | Status: DC

## 2017-12-08 MED ORDER — METOPROLOL TARTRATE 50 MG PO TABS: 50.0000 mg | ORAL_TABLET | Freq: Two times a day (BID) | ORAL | 1 refills | Status: DC

## 2017-12-08 MED ORDER — OLMESARTAN MEDOXOMIL 20 MG PO TABS: ORAL_TABLET | ORAL | 1 refills | Status: DC

## 2017-12-11 ENCOUNTER — Ambulatory Visit: Payer: Self-pay | Admitting: Adult Health

## 2017-12-15 ENCOUNTER — Other Ambulatory Visit: Payer: Self-pay | Admitting: *Deleted

## 2017-12-15 DIAGNOSIS — I2692 Saddle embolus of pulmonary artery without acute cor pulmonale: Secondary | ICD-10-CM

## 2017-12-15 DIAGNOSIS — I10 Essential (primary) hypertension: Secondary | ICD-10-CM

## 2017-12-15 MED ORDER — LOSARTAN POTASSIUM 100 MG PO TABS: 100.0000 mg | ORAL_TABLET | Freq: Every day | ORAL | 0 refills | Status: DC

## 2017-12-15 MED ORDER — RIVAROXABAN 20 MG PO TABS: 20.0000 mg | ORAL_TABLET | Freq: Every day | ORAL | 0 refills | Status: DC

## 2017-12-15 MED ORDER — FUROSEMIDE 40 MG PO TABS: 20.0000 mg | ORAL_TABLET | Freq: Every day | ORAL | 0 refills | Status: DC

## 2017-12-27 ENCOUNTER — Ambulatory Visit: Payer: Self-pay | Admitting: Internal Medicine

## 2017-12-30 ENCOUNTER — Emergency Department (HOSPITAL_COMMUNITY): Payer: Medicare Other

## 2017-12-30 ENCOUNTER — Encounter (HOSPITAL_COMMUNITY): Payer: Self-pay

## 2017-12-30 ENCOUNTER — Emergency Department (HOSPITAL_COMMUNITY)
Admission: EM | Admit: 2017-12-30 | Discharge: 2017-12-30 | Disposition: A | Discharge status: Home / Self Care | Payer: Medicare Other | Attending: Emergency Medicine | Admitting: Emergency Medicine

## 2017-12-30 ENCOUNTER — Other Ambulatory Visit: Payer: Self-pay

## 2017-12-30 DIAGNOSIS — I5032 Chronic diastolic (congestive) heart failure: Secondary | ICD-10-CM | POA: Diagnosis not present

## 2017-12-30 DIAGNOSIS — Z79899 Other long term (current) drug therapy: Secondary | ICD-10-CM | POA: Diagnosis not present

## 2017-12-30 DIAGNOSIS — R0602 Shortness of breath: Secondary | ICD-10-CM | POA: Diagnosis not present

## 2017-12-30 DIAGNOSIS — R079 Chest pain, unspecified: Secondary | ICD-10-CM | POA: Diagnosis present

## 2017-12-30 DIAGNOSIS — I110 Hypertensive heart disease with heart failure: Secondary | ICD-10-CM | POA: Insufficient documentation

## 2017-12-30 DIAGNOSIS — I2692 Saddle embolus of pulmonary artery without acute cor pulmonale: Secondary | ICD-10-CM

## 2017-12-30 LAB — CBC
HEMATOCRIT: 44 % (ref 36.0–46.0)
HEMOGLOBIN: 13.3 g/dL (ref 12.0–15.0)
MCH: 28.6 pg (ref 26.0–34.0)
MCHC: 30.2 g/dL (ref 30.0–36.0)
MCV: 94.6 fL (ref 78.0–100.0)
Platelets: 243 10*3/uL (ref 150–400)
RBC: 4.65 MIL/uL (ref 3.87–5.11)
RDW: 11.9 % (ref 11.5–15.5)
WBC: 6.4 10*3/uL (ref 4.0–10.5)

## 2017-12-30 LAB — BASIC METABOLIC PANEL
ANION GAP: 7 (ref 5–15)
BUN: 15 mg/dL (ref 8–23)
CO2: 27 mmol/L (ref 22–32)
Calcium: 8.9 mg/dL (ref 8.9–10.3)
Chloride: 108 mmol/L (ref 98–111)
Creatinine, Ser: 0.91 mg/dL (ref 0.44–1.00)
GFR calc Af Amer: >60 mL/min (ref >60)
Glucose, Bld: 149 mg/dL — ABNORMAL HIGH (ref 70–99)
POTASSIUM: 3.8 mmol/L (ref 3.5–5.1)
SODIUM: 142 mmol/L (ref 135–145)

## 2017-12-30 LAB — I-STAT TROPONIN, ED: Troponin i, poc: 0 ng/mL (ref 0.00–0.08)

## 2017-12-30 MED ORDER — IOPAMIDOL (ISOVUE-370) INJECTION 76%
INTRAVENOUS | Status: AC
  Filled 2017-12-30: qty 100

## 2017-12-30 MED ORDER — IOPAMIDOL (ISOVUE-370) INJECTION 76%
100.0000 mL | Freq: Once | INTRAVENOUS | Status: AC | PRN
  Administered 2017-12-30: 100 mL via INTRAVENOUS

## 2017-12-30 MED ORDER — RIVAROXABAN 20 MG PO TABS: 20.0000 mg | ORAL_TABLET | Freq: Every day | ORAL | 0 refills | Status: DC

## 2017-12-30 NOTE — ED Notes (Signed)
ED Provider at bedside. 

## 2017-12-30 NOTE — ED Notes (Signed)
Waiting for Dr. Rosalia Hammersay pt wants to speak with her about a prescription.

## 2017-12-30 NOTE — ED Notes (Signed)
Patient transported to CT 

## 2017-12-30 NOTE — Discharge Instructions (Addendum)
You have no new evidence of blood clots here on your exam.  Please continue all your medications.  Return to ED if you are worse at any time.

## 2017-12-30 NOTE — ED Triage Notes (Signed)
Onset 2 days shortness of breath and chest tightness.  Talking in complete sentences.  Pt concerned for blood clot.

## 2017-12-30 NOTE — ED Provider Notes (Signed)
[facility]   CSN: [id number] Arrival date & time: 12/30/17  1303     History   Chief Complaint Chief Complaint  Patient presents with  . Chest Pain  . Shortness of Breath    HPI Kerry Liu is a 67 y.o. female.  HPI 67 year old female history of pulmonary embolism, hypertension presents today with increased dyspnea and cough for the past 5 days.  Cough has been nonproductive and she has not had any associated fever or chills.  She does have a history of pulmonary embolism and is on Zaroxolyn.  She does not report missing any of this medication.  He does report missing some of her antihypertensives.  She went to see her primary care doctor on Wednesday and states that she was told she could not see him due to an outstanding bill.  Denies any chest pain, there are, chills.  She states that she has had some bilateral leg swelling at her baseline.  She has not noted any lateralized symptoms consistent with DVT. Past Medical History:  Diagnosis Date  . Acute meniscal tear of knee    RIGHT  . Arthritis    "right knee" (11/21/2012)  . Elevated hemoglobin A1c   . Exertional shortness of breath    "this week" (11/21/2012)  . History of DVT (deep vein thrombosis)    IN PREGNANCY  . HTN (hypertension)    CARDIOLOGIST--  DR HOCHREIN-- CLEARANCE NOTE IN EPIC AND CHART FROM 08-21-2012  . Mixed hyperlipidemia   . Obesity   . Pulmonary embolism (HCC)    "3 today" (11/21/2012)  . RBBB (right bundle branch block with left anterior fascicular block)   . Seasonal allergies   . Sinus tachycardia   . Sleep apnea   . Vitamin D deficiency     Patient Active Problem List   Diagnosis Date Noted  . OSA on CPAP 06/08/2016  . Chronic diastolic CHF (congestive heart failure) (HCC) 06/08/2016  . 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 Medication management 07/10/2015  . Mixed hyperlipidemia   . Seasonal allergies   . Prediabetes     . Morbid obesity (HCC)   . Vitamin D deficiency   . History of pulmonary embolus (PE)   . Essential hypertension 03/25/2009    Past Surgical History:  Procedure Laterality Date  . IVC filter placed in May 2014 N/A   . KNEE ARTHROSCOPY Right 12-09-2002  . KNEE ARTHROSCOPY WITH MEDIAL MENISECTOMY Right 10/05/2012   Procedure: KNEE ARTHROSCOPY WITH MEDIAL MENISECTOMY;  Surgeon: Jacki Conesonald A Gioffre, MD;  [facility] [place];  Service: Orthopedics;  Laterality: Right;  . TOTAL ABDOMINAL HYSTERECTOMY W/ BILATERAL SALPINGOOPHORECTOMY  ~ 1986   W/ BILATERAL SALPINGOOPHORECTOMY  . TRANSTHORACIC ECHOCARDIOGRAM  11-17-2010   MILD LVH/ EF 60-65%/ GRADE I DIASTOLIC DYSFUNCTION/ MILD LAE / MILD RAE     OB History   None      Home Medications    Prior to Admission medications   Medication Sig Start Date End Date Taking? Authorizing Provider  acetaminophen-codeine (TYLENOL #3) 300-30 MG tablet Take 1 tablet by mouth every 6 (six) hours as needed for moderate pain. 06/30/16  Yes Quentin Mullingollier, Amanda, PA-C  albuterol (PROVENTIL) (2.5 MG/3ML) 0.083% nebulizer solution Take 3 mLs (2.5 mg total) by nebulization every 6 (six) hours as needed for wheezing or shortness of breath. 07/01/16  Yes Quentin Mullingollier, Amanda, PA-C  amLODipine (NORVASC) 10 MG tablet Take 1 tablet (10 mg total)  by mouth daily. 03/28/17 03/28/18 Yes Quentin Mulling, PA-C  atorvastatin (LIPITOR) 80 MG tablet Take 0.5 tablets (40 mg total) by mouth every evening. Patient taking differently: Take 40 mg by mouth every morning.  12/08/17  Yes Lucky Cowboy, MD  Cholecalciferol (VITAMIN D3) 5000 UNITS CAPS Take 5,000 Units by mouth daily.    Yes [provider]  furosemide (LASIX) 40 MG tablet Take 0.5 tablets (20 mg total) by mouth daily. Patient taking differently: Take 40 mg by mouth every morning.  12/15/17  Yes Lucky Cowboy, MD  losartan (COZAAR) 100 MG tablet Take 1 tablet (100 mg total) by mouth daily. 12/15/17  Yes  Lucky Cowboy, MD  metoprolol tartrate (LOPRESSOR) 50 MG tablet Take 1 tablet (50 mg total) by mouth 2 (two) times daily. 12/08/17  Yes Lucky Cowboy, MD  Multiple Vitamin (MULTIVITAMIN) tablet Take 1 tablet by mouth daily.   Yes [provider]  rivaroxaban (XARELTO) 20 MG TABS tablet Take 1 tablet (20 mg total) by mouth daily with supper. 12/15/17  Yes Lucky Cowboy, MD  losartan (COZAAR) 100 MG tablet Take 1 tablet (100 mg total) by mouth daily. Patient not taking: Reported on 12/30/2017 03/30/17 03/30/18  Quentin Mulling, PA-C    Family History Family History  Problem Relation Age of Onset  . Heart attack Unknown   . Heart attack Father   . Hypertension Brother   . Diabetes Brother   . Stroke Brother   . Sudden death Brother     Social History Social History   Tobacco Use  . Smoking status: Never Smoker  . Smokeless tobacco: Never Used  Substance Use Topics  . Alcohol use: No  . Drug use: No     Allergies   Codeine; Hydrocodone; Oxycodone; and Tramadol   Review of Systems Review of Systems  All other systems reviewed and are negative.    Physical Exam Updated Vital Signs BP (!) 168/129   Pulse (!) 105   Temp 98.9 F (37.2 C) (Oral)   Resp 18   SpO2 95%   Physical Exam  Constitutional: She is oriented to person, place, and time. She appears well-developed and well-nourished.  HENT:  Head: Normocephalic.  Eyes: Pupils are equal, round, and reactive to light.  Neck: Normal range of motion.  Cardiovascular: Regular rhythm and normal pulses.  Pulmonary/Chest: Effort normal and breath sounds normal.  Coughing on exam  Abdominal: Soft. Bowel sounds are normal.  Musculoskeletal: Normal range of motion.       Right lower leg: She exhibits edema. She exhibits no tenderness.       Left lower leg: She exhibits edema. She exhibits no tenderness.  There is some trace edema bilaterally No cords or tenderness noted  Neurological: She is alert and  oriented to person, place, and time.  Skin: Skin is warm and dry. Capillary refill takes less than 2 seconds.  Psychiatric: She has a normal mood and affect. Her behavior is normal.  Nursing note and vitals reviewed.    ED Treatments / Results  Labs (all labs ordered are listed, but only abnormal results are displayed) Labs Reviewed  BASIC METABOLIC PANEL - Abnormal; Notable for the following components:      Result Value   Glucose, Bld 149 (*)    All other components within normal limits  CBC  I-STAT TROPONIN, ED    EKG EKG Interpretation  Date/Time:  Saturday December 30 2017 13:09:42 EDT Ventricular Rate:  98 PR Interval:  122 QRS Duration:  130 QT Interval:  392 QTC Calculation: 500 R Axis:   -68 Text Interpretation:  Normal sinus rhythm Right bundle branch block Left anterior fascicular block Bifascicular block Left ventricular hypertrophy Abnormal ECG Although rate has decreased since last tracing 16 June 2016 Confirmed by Margarita Grizzle [phone]) on 12/30/2017 2:06:14 PM   Radiology Dg Chest 2 View  Result Date: 12/30/2017 CLINICAL DATA:  Shortness of breath and chest tightness for 2 days. EXAM: CHEST - 2 VIEW COMPARISON:  CT chest and PA and lateral chest 06/16/2016. FINDINGS: There is cardiomegaly without edema. The lungs are clear. No pneumothorax or pleural effusion. No acute or focal bony abnormality. IVC filter noted. IMPRESSION: Cardiomegaly without acute disease. Electronically Signed   By: Drusilla Kanner M.D.   On: 12/30/2017 14:25    Procedures Procedures (including critical care time)  Medications Ordered in ED Medications - No data to display   Initial Impression / Assessment and Plan / ED Course  I have reviewed the triage vital signs and the nursing notes.  Pertinent labs & imaging results that were available during my care of the patient were reviewed by me and considered in my medical decision making (see chart for details).     67 year old female  history of PE on anticoagulation presents with worsening dyspnea over the past several days.  Evaluation here shows no evidence of large vessel pulmonary embolism.  Patient is on anticoagulation.  However, she has been having difficulty accessing care and some of her medications due to cost.  Social work was consulted-   .   Since oxygen saturations have been 95 to 97% and she has a normal heart rate and EKG here is unchanged from prior.  She appears stable for discharge.  She is advised of return precautions and need for follow-up and voices understanding. Final Clinical Impressions(s) / ED Diagnoses   Final diagnoses:  Shortness of breath    ED Discharge Orders    None       Margarita Grizzle, MD 12/30/17 1556

## 2018-01-23 ENCOUNTER — Encounter: Payer: Self-pay | Admitting: Physician Assistant

## 2018-04-03 ENCOUNTER — Encounter: Payer: Self-pay | Admitting: Physician Assistant

## 2018-07-17 ENCOUNTER — Encounter: Payer: Self-pay | Admitting: Physician Assistant

## 2018-07-22 ENCOUNTER — Inpatient Hospital Stay (HOSPITAL_COMMUNITY)
Admission: EM | Admit: 2018-07-22 | Discharge: 2018-07-25 | DRG: 292 | Disposition: A | Discharge status: Home Health | Payer: Medicare Other | Attending: Internal Medicine | Admitting: Internal Medicine

## 2018-07-22 ENCOUNTER — Emergency Department (HOSPITAL_COMMUNITY): Payer: Medicare Other

## 2018-07-22 ENCOUNTER — Encounter (HOSPITAL_COMMUNITY): Payer: Self-pay | Admitting: *Deleted

## 2018-07-22 ENCOUNTER — Inpatient Hospital Stay (HOSPITAL_COMMUNITY): Payer: Medicare Other

## 2018-07-22 ENCOUNTER — Other Ambulatory Visit: Payer: Self-pay

## 2018-07-22 DIAGNOSIS — Z885 Allergy status to narcotic agent status: Secondary | ICD-10-CM

## 2018-07-22 DIAGNOSIS — Z79899 Other long term (current) drug therapy: Secondary | ICD-10-CM | POA: Diagnosis not present

## 2018-07-22 DIAGNOSIS — Z7901 Long term (current) use of anticoagulants: Secondary | ICD-10-CM

## 2018-07-22 DIAGNOSIS — N390 Urinary tract infection, site not specified: Secondary | ICD-10-CM | POA: Diagnosis not present

## 2018-07-22 DIAGNOSIS — Z9989 Dependence on other enabling machines and devices: Secondary | ICD-10-CM

## 2018-07-22 DIAGNOSIS — G4733 Obstructive sleep apnea (adult) (pediatric): Secondary | ICD-10-CM | POA: Diagnosis present

## 2018-07-22 DIAGNOSIS — I10 Essential (primary) hypertension: Secondary | ICD-10-CM

## 2018-07-22 DIAGNOSIS — I700 Atherosclerosis of aorta: Secondary | ICD-10-CM | POA: Diagnosis not present

## 2018-07-22 DIAGNOSIS — E669 Obesity, unspecified: Secondary | ICD-10-CM | POA: Diagnosis not present

## 2018-07-22 DIAGNOSIS — R7303 Prediabetes: Secondary | ICD-10-CM | POA: Diagnosis present

## 2018-07-22 DIAGNOSIS — I5033 Acute on chronic diastolic (congestive) heart failure: Secondary | ICD-10-CM | POA: Diagnosis present

## 2018-07-22 DIAGNOSIS — R0789 Other chest pain: Secondary | ICD-10-CM

## 2018-07-22 DIAGNOSIS — R7989 Other specified abnormal findings of blood chemistry: Secondary | ICD-10-CM | POA: Diagnosis not present

## 2018-07-22 DIAGNOSIS — Z86711 Personal history of pulmonary embolism: Secondary | ICD-10-CM | POA: Diagnosis not present

## 2018-07-22 DIAGNOSIS — N179 Acute kidney failure, unspecified: Secondary | ICD-10-CM | POA: Diagnosis present

## 2018-07-22 DIAGNOSIS — E785 Hyperlipidemia, unspecified: Secondary | ICD-10-CM | POA: Diagnosis not present

## 2018-07-22 DIAGNOSIS — I110 Hypertensive heart disease with heart failure: Principal | ICD-10-CM | POA: Diagnosis present

## 2018-07-22 DIAGNOSIS — E782 Mixed hyperlipidemia: Secondary | ICD-10-CM | POA: Diagnosis present

## 2018-07-22 DIAGNOSIS — Z6841 Body Mass Index (BMI) 40.0 and over, adult: Secondary | ICD-10-CM

## 2018-07-22 DIAGNOSIS — Z7951 Long term (current) use of inhaled steroids: Secondary | ICD-10-CM

## 2018-07-22 DIAGNOSIS — R079 Chest pain, unspecified: Secondary | ICD-10-CM | POA: Diagnosis present

## 2018-07-22 DIAGNOSIS — J9811 Atelectasis: Secondary | ICD-10-CM | POA: Diagnosis not present

## 2018-07-22 DIAGNOSIS — N3000 Acute cystitis without hematuria: Secondary | ICD-10-CM | POA: Diagnosis present

## 2018-07-22 DIAGNOSIS — I5032 Chronic diastolic (congestive) heart failure: Secondary | ICD-10-CM | POA: Diagnosis present

## 2018-07-22 DIAGNOSIS — R0602 Shortness of breath: Secondary | ICD-10-CM | POA: Diagnosis not present

## 2018-07-22 DIAGNOSIS — I452 Bifascicular block: Secondary | ICD-10-CM | POA: Diagnosis not present

## 2018-07-22 DIAGNOSIS — I2692 Saddle embolus of pulmonary artery without acute cor pulmonale: Secondary | ICD-10-CM

## 2018-07-22 LAB — URINALYSIS, ROUTINE W REFLEX MICROSCOPIC
Bilirubin Urine: NEGATIVE
Glucose, UA: NEGATIVE mg/dL
KETONES UR: NEGATIVE mg/dL
NITRITE: POSITIVE — AB
PH: 6 (ref 5.0–8.0)
Protein, ur: NEGATIVE mg/dL
Specific Gravity, Urine: 1.03 (ref 1.005–1.030)

## 2018-07-22 LAB — LIPID PANEL
CHOLESTEROL: 203 mg/dL — AB (ref 0–200)
HDL: 45 mg/dL (ref >40)
LDL Cholesterol: 151 mg/dL — ABNORMAL HIGH (ref 0–99)
Total CHOL/HDL Ratio: 4.5 RATIO
Triglycerides: 35 mg/dL (ref <150)
VLDL: 7 mg/dL (ref 0–40)

## 2018-07-22 LAB — BASIC METABOLIC PANEL
ANION GAP: 7 (ref 5–15)
BUN: 18 mg/dL (ref 8–23)
CALCIUM: 9.2 mg/dL (ref 8.9–10.3)
CHLORIDE: 107 mmol/L (ref 98–111)
CO2: 26 mmol/L (ref 22–32)
Creatinine, Ser: 1.22 mg/dL — ABNORMAL HIGH (ref 0.44–1.00)
GFR calc Af Amer: 54 mL/min — ABNORMAL LOW (ref >60)
GFR calc non Af Amer: 46 mL/min — ABNORMAL LOW (ref >60)
GLUCOSE: 138 mg/dL — AB (ref 70–99)
Potassium: 4.2 mmol/L (ref 3.5–5.1)
Sodium: 140 mmol/L (ref 135–145)

## 2018-07-22 LAB — ECHOCARDIOGRAM COMPLETE
Height: 62 in
WEIGHTICAEL: 5267.2 [oz_av]

## 2018-07-22 LAB — CBC
HEMATOCRIT: 45.2 % (ref 36.0–46.0)
Hemoglobin: 13.6 g/dL (ref 12.0–15.0)
MCH: 28.2 pg (ref 26.0–34.0)
MCHC: 30.1 g/dL (ref 30.0–36.0)
MCV: 93.8 fL (ref 80.0–100.0)
PLATELETS: 255 10*3/uL (ref 150–400)
RBC: 4.82 MIL/uL (ref 3.87–5.11)
RDW: 11.9 % (ref 11.5–15.5)
WBC: 7.2 10*3/uL (ref 4.0–10.5)
nRBC: 0 % (ref 0.0–0.2)

## 2018-07-22 LAB — HEMOGLOBIN A1C
Hgb A1c MFr Bld: 6 % — ABNORMAL HIGH (ref 4.8–5.6)
MEAN PLASMA GLUCOSE: 125.5 mg/dL

## 2018-07-22 LAB — TROPONIN I
Troponin I: <0.03 ng/mL (ref <0.03)
Troponin I: <0.03 ng/mL (ref <0.03)
Troponin I: <0.03 ng/mL (ref <0.03)

## 2018-07-22 MED ORDER — RIVAROXABAN 20 MG PO TABS
20.0000 mg | ORAL_TABLET | Freq: Every day | ORAL | Status: DC
  Administered 2018-07-22 – 2018-07-24 (×3): 20 mg via ORAL
  Filled 2018-07-22 (×3): qty 1

## 2018-07-22 MED ORDER — LOSARTAN POTASSIUM 50 MG PO TABS: 100.0000 mg | ORAL_TABLET | Freq: Every day | ORAL | Status: DC

## 2018-07-22 MED ORDER — SODIUM CHLORIDE 0.9% FLUSH
3.0000 mL | Freq: Once | INTRAVENOUS | Status: AC
  Administered 2018-07-22: 3 mL via INTRAVENOUS

## 2018-07-22 MED ORDER — PERFLUTREN LIPID MICROSPHERE
1.0000 mL | INTRAVENOUS | Status: AC | PRN
  Administered 2018-07-22: 4 mL via INTRAVENOUS
  Filled 2018-07-22: qty 10

## 2018-07-22 MED ORDER — NITROFURANTOIN MONOHYD MACRO 100 MG PO CAPS: 100.0000 mg | ORAL_CAPSULE | Freq: Two times a day (BID) | ORAL | Status: DC

## 2018-07-22 MED ORDER — AMLODIPINE BESYLATE 10 MG PO TABS
10.0000 mg | ORAL_TABLET | Freq: Every day | ORAL | Status: DC
  Administered 2018-07-22 – 2018-07-25 (×4): 10 mg via ORAL
  Filled 2018-07-22 (×4): qty 1

## 2018-07-22 MED ORDER — ATORVASTATIN CALCIUM 40 MG PO TABS
40.0000 mg | ORAL_TABLET | Freq: Every evening | ORAL | Status: DC
  Administered 2018-07-22 – 2018-07-24 (×3): 40 mg via ORAL
  Filled 2018-07-22 (×3): qty 1

## 2018-07-22 MED ORDER — ONDANSETRON HCL 4 MG/2ML IJ SOLN: 4.0000 mg | Freq: Four times a day (QID) | INTRAMUSCULAR | Status: DC | PRN

## 2018-07-22 MED ORDER — METOPROLOL TARTRATE 50 MG PO TABS
50.0000 mg | ORAL_TABLET | Freq: Two times a day (BID) | ORAL | Status: DC
  Administered 2018-07-22 – 2018-07-25 (×7): 50 mg via ORAL
  Filled 2018-07-22 (×7): qty 1

## 2018-07-22 MED ORDER — RIVAROXABAN 20 MG PO TABS: 20.0000 mg | ORAL_TABLET | Freq: Every day | ORAL | 0 refills | Status: DC

## 2018-07-22 MED ORDER — IOPAMIDOL (ISOVUE-370) INJECTION 76%
100.0000 mL | Freq: Once | INTRAVENOUS | Status: AC | PRN
  Administered 2018-07-22: 100 mL via INTRAVENOUS

## 2018-07-22 MED ORDER — ACETAMINOPHEN 650 MG RE SUPP: 650.0000 mg | Freq: Four times a day (QID) | RECTAL | Status: DC | PRN

## 2018-07-22 MED ORDER — ACETAMINOPHEN 325 MG PO TABS
650.0000 mg | ORAL_TABLET | Freq: Four times a day (QID) | ORAL | Status: DC | PRN
  Administered 2018-07-23: 650 mg via ORAL
  Filled 2018-07-22: qty 2

## 2018-07-22 MED ORDER — IOPAMIDOL (ISOVUE-370) INJECTION 76%
INTRAVENOUS | Status: AC
  Filled 2018-07-22: qty 100

## 2018-07-22 MED ORDER — FUROSEMIDE 10 MG/ML IJ SOLN
20.0000 mg | Freq: Two times a day (BID) | INTRAMUSCULAR | Status: DC
  Administered 2018-07-22 – 2018-07-24 (×5): 20 mg via INTRAVENOUS
  Filled 2018-07-22 (×5): qty 2

## 2018-07-22 MED ORDER — NITROFURANTOIN MONOHYD MACRO 100 MG PO CAPS
100.0000 mg | ORAL_CAPSULE | Freq: Two times a day (BID) | ORAL | Status: DC
  Administered 2018-07-22 – 2018-07-25 (×6): 100 mg via ORAL
  Filled 2018-07-22 (×7): qty 1

## 2018-07-22 MED ORDER — ONDANSETRON HCL 4 MG PO TABS: 4.0000 mg | ORAL_TABLET | Freq: Four times a day (QID) | ORAL | Status: DC | PRN

## 2018-07-22 NOTE — H&P (Addendum)
Date: 07/22/2018               Patient Name:  Kerry Liu MRN: [id number]  DOB: 12-Nov-1950 Age / Sex: 68 y.o., female   PCP: Patient, No Pcp Per         Medical Service: Internal Medicine Teaching Service         Attending Physician: Dr. Inez Catalina, MD    First Contact: Dr. Avie Arenas Pager: [phone]  Second Contact: Dr. Caron Presume Pager: [phone]       After Hours (After 5p/  First Contact Pager: [phone]  weekends / holidays): Second Contact Pager: [phone]   Chief Complaint: chest pain  History of Present Illness: Kerry Liu is a 68 yo female with a medical history of HTN, PE on xarelto, chronic diastolic CHF (EF 92-42% with grade 1 diastolic dysfunction on 2017 ECHO), HLD, obesity, prediabetes, OSA on CPAP who presented to the ED with sharp 6/10 non-radiating chest pain that began acutely overnight when she was sitting in a chair. The pain gradually resolved over five hours without any medications. She denies nausea, vomiting, diaphoresis. She endorses new dyspnea, nonproductive cough, and bilateral leg tightness and pain for the past week as well. She is on Xarelto for a PE that occurred one year ago, but she ran out of this medication 3-4 days ago. She has also run out of some of her blood pressure medications. She has been taking metoprolol and lasix every day. She does not check her weights at home. She denies orthopnea. She does endorse dysuria, but denies abdominal pain and flank pain. She thinks her urinary frequency is due to the lasix. She is currently looking for a PCP as she was recently dismissed from her last practice for difficulties paying her copay.   Upon arrival to the ED, pt was afebrile and hypertensive to 189/111. Labs significant for Cr 1.2 (BL 0.9), neg troponin x2. UA with moderate hemoglobin, positive nitrites, and trace leukocytes. EKG without ischemic changes. CXR shows cardiomegaly and pulmonary edema. CTA chest without PE, aortic aneurysm, or aortic  dissection.  Meds:  Current Meds  Medication Sig  . albuterol (PROVENTIL) (2.5 MG/3ML) 0.083% nebulizer solution Take 3 mLs (2.5 mg total) by nebulization every 6 (six) hours as needed for wheezing or shortness of breath.  Marland Kitchen amLODipine (NORVASC) 10 MG tablet Take 1 tablet (10 mg total) by mouth daily.  . Cholecalciferol (VITAMIN D3) 5000 UNITS CAPS Take 5,000 Units by mouth daily.   Marland Kitchen losartan (COZAAR) 100 MG tablet Take 1 tablet (100 mg total) by mouth daily.  . Multiple Vitamin (MULTIVITAMIN) tablet Take 1 tablet by mouth daily.  . [DISCONTINUED] rivaroxaban (XARELTO) 20 MG TABS tablet Take 1 tablet (20 mg total) by mouth daily with supper.    Allergies: Allergies as of 07/22/2018 - Review Complete 07/22/2018  Allergen Reaction Noted  . Codeine Other (See Comments) 03/21/2016  . Hydrocodone  05/29/2013  . Oxycodone Rash 08/16/2012  . Tramadol Rash 10/01/2012   Past Medical History:  Diagnosis Date  . Acute meniscal tear of knee    RIGHT  . Arthritis    "right knee" (11/21/2012)  . Elevated hemoglobin A1c   . Exertional shortness of breath    "this week" (11/21/2012)  . History of DVT (deep vein thrombosis)    IN PREGNANCY  . HTN (hypertension)    CARDIOLOGIST--  DR HOCHREIN-- CLEARANCE NOTE IN EPIC AND CHART FROM 08-21-2012  . Mixed hyperlipidemia   .  Obesity   . Pulmonary embolism (HCC)    "3 today" (11/21/2012)  . RBBB (right bundle branch block with left anterior fascicular block)   . Seasonal allergies   . Sinus tachycardia   . Sleep apnea   . Vitamin D deficiency    Past Surgical History:  Procedure Laterality Date  . IVC filter placed in May 2014 N/A   . KNEE ARTHROSCOPY Right 12-09-2002  . KNEE ARTHROSCOPY WITH MEDIAL MENISECTOMY Right 10/05/2012   Procedure: KNEE ARTHROSCOPY WITH MEDIAL MENISECTOMY;  Surgeon: Jacki Conesonald A Gioffre, MD;  [facility];  Service: Orthopedics;  Laterality: Right;  . TOTAL ABDOMINAL HYSTERECTOMY W/ BILATERAL  SALPINGOOPHORECTOMY  ~ 1986   W/ BILATERAL SALPINGOOPHORECTOMY  . TRANSTHORACIC ECHOCARDIOGRAM  11-17-2010   MILD LVH/ EF 60-65%/ GRADE I DIASTOLIC DYSFUNCTION/ MILD LAE / MILD RAE    Family History: No family history of blood clots.  Family History  Problem Relation Age of Onset  . Heart attack Other   . Heart attack Father   . Hypertension Brother   . Diabetes Brother   . Stroke Brother   . Sudden death Brother    Social History: Lives with her grandson. 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 Never smoker. Denies etoh and illicit drug use.  Review of Systems: A complete ROS was negative except as per HPI.   Physical Exam: Blood pressure (!) 168/97, pulse 83, temperature 97.8 F (36.6 C), temperature source Oral, resp. rate 19, SpO2 96 %.  Constitutional: Obese. Alert and oriented x3. No distress. Eyes: Pupils are equal, round, and reactive to light. EOM are normal.  Cardiovascular: Normal rate and regular rhythm. No murmurs, rubs, or gallops. Unable to assess JVD due to neck habitus. Pulmonary/Chest: Effort normal. Clear to auscultation bilaterally. No wheezes, rales, or rhonchi. Abdominal: Bowel sounds present. Soft, non-distended, non-tender. Back: No CVA tenderness. Ext: 2+ pitting edema BLE with ttp. Chronic stasis skin changes.  Skin: Warm and dry.   EKG: personally reviewed my interpretation is NSR, LAD, RBBB, left anterior fascicular block, no ischemic changes  CXR: personally reviewed my interpretation is cardiomegaly and pulmonary edema.  Assessment & Plan by Problem: Active Problems:   Chest pain  Kerry Liu is a 68 yo female with a medical history of HTN, PE on xarelto, chronic diastolic CHF, HLD, obesity, prediabetes, OSA on CPAP who presented with left-sided chest pain that resolved spontaneously. She also endorsed one week of dyspnea, cough, and lower extremity tightness and was found to be volume overloaded on exam. She was admitted for further  evaluation and management.   Atypical chest pain  - EKG without ischemic changes. Troponin negative x2. - No history of CAD. Risk factors include obesity, HTN, HLD, and prediabetes. Plan - Monitor for further episodes of chest pain - Tele - Repeat EKG - A1c, lipid panel  Diastolic heart failure exacerbation - Pt appears volume overloaded on exam with 2+ pitting edema in the BLE.  - Satting well on room air. - Last recorded weight one year ago was 316 lbs. Patient does not weigh herself at home.  - Last ECHO in 2017 showed EF 65-70% with grade 1 diastolic dysfunction - Takes lasix 20mg  daily at home - Does not wear her CPAP every night at home  Plan - Lasix 20mg  IV BID - Daily BMP - Daily weights - Strict I/Os - ECHO  UTI - Endorses dysuria. Has nitrites and leukocytes on UA. Plan - Macrobid 100mg  BID for 5 days  AKI - Cr increased to 1.2 on admission (BL 0.9) Plan - Hold home losartan - Trend BMP   HTN - Hypertensive on admission Plan - Continue home amlodipine 10mg  daily and metoprolol 50mg  daily - Hold losartan 2/2 AKI  PE: continue Xarelto HLD: continue home lipitor 80mg  daily OSA: CPAP at night  FEN: no IV fluids, heart healthy diet, replace electrolytes as needed  DVT ppx: home xarelto Code status: FULL code  Dispo: Admit patient to Inpatient with expected length of stay greater than 2 midnights.  Signed: Dionne Anoorrell, Kosha Jaquith N, MD 07/22/2018, 9:57 AM  Pager: [phone]

## 2018-07-22 NOTE — ED Notes (Signed)
Pt ambulated to restroom from room, tolerated well. 

## 2018-07-22 NOTE — ED Triage Notes (Signed)
Pt reports "burning, dull" pain under the left breast, intermittent, occurring about every 10 minutes, associated with SOB and pain in the shoulder blades. +cough (non productive)

## 2018-07-22 NOTE — ED Provider Notes (Signed)
[facility] EMERGENCY DEPARTMENT Provider Note   CSN: [id number] Arrival date & time: 07/22/18  0205     History   Chief Complaint Chief Complaint  Patient presents with  . Chest Pain    HPI Kerry Liu is a 68 y.o. female.  Patient with history of obesity, pulmonary embolism on Xarelto presenting with left-sided burning chest pain that onset last evening about 9:00.  She reports the pain comes and goes lasting for several minutes to hours at a time.  It radiates to her mid back and is associated with shortness of breath.  She is not had this kind of pain in the past.  She has not had her Xarelto for about 2 weeks.  Pain radiates to between her shoulder blades and associate with shortness of breath and nonproductive cough.  The pain is somewhat improved since she arrived.  She has not had this pain in the past.  There is no breaks in the skin or rash.  Denies any leg pain or leg swelling.  No abdominal pain, nausea or vomiting.  Denies ever having a heart attack.  She does not know when her last stress test was.  The history is provided by the patient.  Chest Pain  Associated symptoms: back pain and shortness of breath   Associated symptoms: no abdominal pain, no dizziness, no fever, no headache, no nausea and no vomiting     Past Medical History:  Diagnosis Date  . Acute meniscal tear of knee    RIGHT  . Arthritis    "right knee" (11/21/2012)  . Elevated hemoglobin A1c   . Exertional shortness of breath    "this week" (11/21/2012)  . History of DVT (deep vein thrombosis)    IN PREGNANCY  . HTN (hypertension)    CARDIOLOGIST--  DR HOCHREIN-- CLEARANCE NOTE IN EPIC AND CHART FROM 08-21-2012  . Mixed hyperlipidemia   . Obesity   . Pulmonary embolism (HCC)    "3 today" (11/21/2012)  . RBBB (right bundle branch block with left anterior fascicular block)   . Seasonal allergies   . Sinus tachycardia   . Sleep apnea   . Vitamin D deficiency     Patient  Active Problem List   Diagnosis Date Noted  . OSA on CPAP 06/08/2016  . Chronic diastolic CHF (congestive heart failure) (HCC) 06/08/2016  . 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 Medication management 07/10/2015  . Mixed hyperlipidemia   . Seasonal allergies   . Prediabetes   . Morbid obesity (HCC)   . Vitamin D deficiency   . History of pulmonary embolus (PE)   . Essential hypertension 03/25/2009    Past Surgical History:  Procedure Laterality Date  . IVC filter placed in May 2014 N/A   . KNEE ARTHROSCOPY Right 12-09-2002  . KNEE ARTHROSCOPY WITH MEDIAL MENISECTOMY Right 10/05/2012   Procedure: KNEE ARTHROSCOPY WITH MEDIAL MENISECTOMY;  Surgeon: Jacki Cones, MD;  Location: [facility];  Service: Orthopedics;  Laterality: Right;  . TOTAL ABDOMINAL HYSTERECTOMY W/ BILATERAL SALPINGOOPHORECTOMY  ~ 1986   W/ BILATERAL SALPINGOOPHORECTOMY  . TRANSTHORACIC ECHOCARDIOGRAM  11-17-2010   MILD LVH/ EF 60-65%/ GRADE I DIASTOLIC DYSFUNCTION/ MILD LAE / MILD RAE     OB History   No obstetric history on file.      Home Medications    Prior to Admission medications   Medication Sig Start Date End Date Taking? Authorizing Provider  acetaminophen-codeine (TYLENOL #3) 300-30 MG  tablet Take 1 tablet by mouth every 6 (six) hours as needed for moderate pain. 06/30/16   Quentin Mullingollier, Amanda, PA-C  albuterol (PROVENTIL) (2.5 MG/3ML) 0.083% nebulizer solution Take 3 mLs (2.5 mg total) by nebulization every 6 (six) hours as needed for wheezing or shortness of breath. 07/01/16   Quentin Mullingollier, Amanda, PA-C  amLODipine (NORVASC) 10 MG tablet Take 1 tablet (10 mg total) by mouth daily. 03/28/17 03/28/18  Quentin Mullingollier, Amanda, PA-C  atorvastatin (LIPITOR) 80 MG tablet Take 0.5 tablets (40 mg total) by mouth every evening. Patient taking differently: Take 40 mg by mouth every morning.  12/08/17   Lucky CowboyMcKeown, William, MD  Cholecalciferol (VITAMIN D3) 5000 UNITS CAPS Take 5,000 Units by mouth daily.      [provider]  furosemide (LASIX) 40 MG tablet Take 0.5 tablets (20 mg total) by mouth daily. Patient taking differently: Take 40 mg by mouth every morning.  12/15/17   Lucky CowboyMcKeown, William, MD  losartan (COZAAR) 100 MG tablet Take 1 tablet (100 mg total) by mouth daily. Patient not taking: Reported on 12/30/2017 03/30/17 03/30/18  Quentin Mullingollier, Amanda, PA-C  losartan (COZAAR) 100 MG tablet Take 1 tablet (100 mg total) by mouth daily. 12/15/17   Lucky CowboyMcKeown, William, MD  metoprolol tartrate (LOPRESSOR) 50 MG tablet Take 1 tablet (50 mg total) by mouth 2 (two) times daily. 12/08/17   Lucky CowboyMcKeown, William, MD  Multiple Vitamin (MULTIVITAMIN) tablet Take 1 tablet by mouth daily.    [provider]  rivaroxaban (XARELTO) 20 MG TABS tablet Take 1 tablet (20 mg total) by mouth daily with supper. 12/30/17   Margarita Grizzleay, Danielle, MD    Family History Family History  Problem Relation Age of Onset  . Heart attack Other   . Heart attack Father   . Hypertension Brother   . Diabetes Brother   . Stroke Brother   . Sudden death Brother     Social History Social History   Tobacco Use  . Smoking status: Never Smoker  . Smokeless tobacco: Never Used  Substance Use Topics  . Alcohol use: No  . Drug use: No     Allergies   Codeine; Hydrocodone; Oxycodone; and Tramadol   Review of Systems Review of Systems  Constitutional: Negative for activity change, appetite change, chills and fever.  HENT: Negative for congestion and rhinorrhea.   Respiratory: Positive for chest tightness and shortness of breath.   Cardiovascular: Positive for chest pain.  Gastrointestinal: Negative for abdominal pain, nausea and vomiting.  Genitourinary: Negative for dysuria, vaginal bleeding and vaginal discharge.  Musculoskeletal: Positive for back pain. Negative for arthralgias and myalgias.  Skin: Negative for rash.  Neurological: Negative for dizziness and headaches.   all other systems are negative except as noted in the  HPI and PMH.     Physical Exam Updated Vital Signs BP (!) 150/97   Pulse 75   Temp 97.8 F (36.6 C) (Oral)   Resp 20   SpO2 95%   Physical Exam Vitals signs and nursing note reviewed.  Constitutional:      General: She is not in acute distress.    Appearance: She is well-developed.  HENT:     Head: Normocephalic and atraumatic.     Mouth/Throat:     Pharynx: No oropharyngeal exudate.  Eyes:     Conjunctiva/sclera: Conjunctivae normal.     Pupils: Pupils are equal, round, and reactive to light.  Neck:     Musculoskeletal: Normal range of motion and neck supple.     Comments:  No meningismus. Cardiovascular:     Rate and Rhythm: Normal rate and regular rhythm.     Heart sounds: Normal heart sounds. No murmur.  Pulmonary:     Effort: Pulmonary effort is normal. No respiratory distress.     Breath sounds: Normal breath sounds.     Comments: Obese, tenderness underneath left breast, no rash Chest:     Chest wall: Tenderness present.  Abdominal:     Palpations: Abdomen is soft.     Tenderness: There is no abdominal tenderness. There is no guarding or rebound.  Musculoskeletal: Normal range of motion.        General: No tenderness.  Skin:    General: Skin is warm.  Neurological:     Mental Status: She is alert and oriented to person, place, and time.     Cranial Nerves: No cranial nerve deficit.     Motor: No abnormal muscle tone.     Coordination: Coordination normal.     Comments:  5/5 strength throughout. CN 2-12 intact.Equal grip strength.   Psychiatric:        Behavior: Behavior normal.      ED Treatments / Results  Labs (all labs ordered are listed, but only abnormal results are displayed) Labs Reviewed  BASIC METABOLIC PANEL - Abnormal; Notable for the following components:      Result Value   Glucose, Bld 138 (*)    Creatinine, Ser 1.22 (*)    GFR calc non Af Amer 46 (*)    GFR calc Af Amer 54 (*)    All other components within normal limits  CBC    TROPONIN I  URINALYSIS, ROUTINE W REFLEX MICROSCOPIC  TROPONIN I    EKG EKG Interpretation  Date/Time:  Sunday July 22 2018 02:22:55 EST Ventricular Rate:  97 PR Interval:  142 QRS Duration: 134 QT Interval:  390 QTC Calculation: 495 R Axis:   -59 Text Interpretation:  Normal sinus rhythm Right bundle branch block Left anterior fascicular block ** Bifascicular block ** Left ventricular hypertrophy Abnormal ECG No significant change was found Confirmed by Glynn Octaveancour, Cassady Turano [id number]) on 07/22/2018 5:40:38 AM   Radiology Dg Chest 2 View  Result Date: 07/22/2018 CLINICAL DATA:  Pain and burning beneath the left breast with dyspnea. EXAM: CHEST - 2 VIEW COMPARISON:  CXR 12/30/2017 FINDINGS: Stable cardiomegaly. Nonaneurysmal thoracic aorta. Central vascular congestion is noted. Subsegmental atelectasis in the right middle lobe. No effusion or pneumothorax. Mild moderate degenerative change is noted along the dorsal spine. IMPRESSION: Cardiomegaly with mild central vascular congestion. Subsegmental atelectasis in the right middle lobe. Electronically Signed   By: Tollie Ethavid  Kwon M.D.   On: 07/22/2018 03:16   Ct Angio Chest Pe W And/or Wo Contrast  Result Date: 07/22/2018 CLINICAL DATA:  Chest pain. PE suspected, intermediate probability, positive D-dimer. EXAM: CT ANGIOGRAPHY CHEST WITH CONTRAST TECHNIQUE: Multidetector CT imaging of the chest was performed using the standard protocol during bolus administration of intravenous contrast. Multiplanar CT image reconstructions and MIPs were obtained to evaluate the vascular anatomy. CONTRAST:  100mL ISOVUE-370 IOPAMIDOL (ISOVUE-370) INJECTION 76% COMPARISON:  Chest CT angiogram dated 12/30/2017. FINDINGS: Cardiovascular: The most peripheral segmental and subsegmental pulmonary arteries can not be definitively characterized due to patient body habitus and mild breathing motion artifact, however, there is no pulmonary embolism identified within the main,  lobar or central segmental pulmonary arteries bilaterally. No thoracic aortic aneurysm or evidence of aortic dissection. Mild aortic atherosclerosis. Cardiomegaly. No pericardial effusion. Mediastinum/Nodes: No mass or enlarged  lymph nodes seen within the mediastinum or perihilar regions. Esophagus appears normal. Trachea appears normal. Lungs/Pleura: Lungs are clear. No pleural effusion or pneumothorax. Upper Abdomen: Limited images of the upper abdomen are unremarkable. Musculoskeletal: No acute or suspicious osseous finding. Review of the MIP images confirms the above findings. IMPRESSION: No acute findings. No pulmonary embolism seen, with mild study limitations detailed above. No aortic aneurysm or evidence of aortic dissection. No pneumonia or pulmonary edema. Aortic Atherosclerosis (ICD10-I70.0). Electronically Signed   By: Bary Richard M.D.   On: 07/22/2018 07:16    Procedures Procedures (including critical care time)  Medications Ordered in ED Medications  sodium chloride flush (NS) 0.9 % injection 3 mL (has no administration in time range)     Initial Impression / Assessment and Plan / ED Course  I have reviewed the triage vital signs and the nursing notes.  Pertinent labs & imaging results that were available during my care of the patient were reviewed by me and considered in my medical decision making (see chart for details).    Dull chest pain underneath her left breast that radiates to the back with shortness of breath onset last night.  History of PE off of Xarelto for several weeks.  EKG is unchanged. Troponin negative. CXR negative.  CTPE negative. Case manager consulted to help patient get back on xarelto.  Pain with both typical and atypical features. Second troponin pending.  Heart score 4. Given risk factors and lack of followup, plan observation admission for cardiac rule out.  D/w internal medicine residents. Final Clinical Impressions(s) / ED Diagnoses   Final  diagnoses:  Nonspecific chest pain  Atypical chest pain    ED Discharge Orders    None       Elaf Clauson, Jeannett Senior, MD 07/22/18 [phone]

## 2018-07-22 NOTE — ED Notes (Signed)
Pt ambulated to restroom with stand by assist.  

## 2018-07-22 NOTE — Plan of Care (Signed)
  Problem: Education: Goal: Knowledge of General Education information will improve Description Including pain rating scale, medication(s)/side effects and non-pharmacologic comfort measures Outcome: Progressing   Problem: Clinical Measurements: Goal: Ability to maintain clinical measurements within normal limits will improve Outcome: Progressing   Problem: Activity: Goal: Risk for activity intolerance will decrease Outcome: Progressing   Problem: Clinical Measurements: Goal: Respiratory complications will improve Outcome: Completed/Met   

## 2018-07-22 NOTE — ED Notes (Signed)
Taken to CT at this time. 

## 2018-07-23 ENCOUNTER — Telehealth: Payer: Self-pay | Admitting: General Practice

## 2018-07-23 DIAGNOSIS — Z79899 Other long term (current) drug therapy: Secondary | ICD-10-CM

## 2018-07-23 DIAGNOSIS — Z86711 Personal history of pulmonary embolism: Secondary | ICD-10-CM

## 2018-07-23 DIAGNOSIS — I452 Bifascicular block: Secondary | ICD-10-CM

## 2018-07-23 DIAGNOSIS — Z7901 Long term (current) use of anticoagulants: Secondary | ICD-10-CM

## 2018-07-23 DIAGNOSIS — Z9989 Dependence on other enabling machines and devices: Secondary | ICD-10-CM

## 2018-07-23 DIAGNOSIS — E669 Obesity, unspecified: Secondary | ICD-10-CM

## 2018-07-23 DIAGNOSIS — N179 Acute kidney failure, unspecified: Secondary | ICD-10-CM

## 2018-07-23 DIAGNOSIS — Z885 Allergy status to narcotic agent status: Secondary | ICD-10-CM

## 2018-07-23 DIAGNOSIS — I5033 Acute on chronic diastolic (congestive) heart failure: Secondary | ICD-10-CM

## 2018-07-23 DIAGNOSIS — E785 Hyperlipidemia, unspecified: Secondary | ICD-10-CM

## 2018-07-23 DIAGNOSIS — I110 Hypertensive heart disease with heart failure: Principal | ICD-10-CM

## 2018-07-23 DIAGNOSIS — R7303 Prediabetes: Secondary | ICD-10-CM

## 2018-07-23 DIAGNOSIS — G4733 Obstructive sleep apnea (adult) (pediatric): Secondary | ICD-10-CM

## 2018-07-23 DIAGNOSIS — N3000 Acute cystitis without hematuria: Secondary | ICD-10-CM | POA: Diagnosis present

## 2018-07-23 DIAGNOSIS — R0789 Other chest pain: Secondary | ICD-10-CM

## 2018-07-23 LAB — BASIC METABOLIC PANEL
Anion gap: 8 (ref 5–15)
BUN: 18 mg/dL (ref 8–23)
CHLORIDE: 105 mmol/L (ref 98–111)
CO2: 27 mmol/L (ref 22–32)
Calcium: 9 mg/dL (ref 8.9–10.3)
Creatinine, Ser: 1.02 mg/dL — ABNORMAL HIGH (ref 0.44–1.00)
GFR calc Af Amer: >60 mL/min (ref >60)
GFR calc non Af Amer: 58 mL/min — ABNORMAL LOW (ref >60)
Glucose, Bld: 118 mg/dL — ABNORMAL HIGH (ref 70–99)
Potassium: 3.9 mmol/L (ref 3.5–5.1)
Sodium: 140 mmol/L (ref 135–145)

## 2018-07-23 LAB — TROPONIN I: Troponin I: <0.03 ng/mL (ref <0.03)

## 2018-07-23 NOTE — Evaluation (Signed)
Physical Therapy Evaluation Patient Details Name: Kerry Liu MRN: [id number] DOB: 07/25/1950 Today's Date: 07/23/2018   History of Present Illness  68 yo female with a medical history of HTN, PE on xarelto, chronic diastolic CHF (EF 14-78%65-70% with grade 1 diastolic dysfunction on 2017 ECHO), HLD, obesity, prediabetes, OSA on CPAP who presented to the ED with sharp 6/10 non-radiating chest pain. Chest CTA negative for acute findings and troponin negative.     Clinical Impression  Pt admitted with above diagnosis. Pt currently with functional limitations due to the deficits listed below (see PT Problem List). On eval, pt required supervision transfers and min guard assist ambulation 40 feet without AD. Pt fatigues quickly. At baseline, she utilizes a rollator for increased gait distances. Pt with primary c/o difficulty with toilet hygiene at home. Educated pt on possibility of obtaining a bidet toilet attachment for home. Pt will benefit from skilled PT to increase their independence and safety with mobility to allow discharge to the venue listed below.       Follow Up Recommendations Home health PT;Supervision - Intermittent    Equipment Recommendations  None recommended by PT    Recommendations for Other Services       Precautions / Restrictions Precautions Precautions: Fall      Mobility  Bed Mobility Overal bed mobility: Modified Independent             General bed mobility comments: +rail, increased time and effort  Transfers Overall transfer level: Needs assistance Equipment used: None Transfers: Sit to/from Stand;Stand Pivot Transfers Sit to Stand: Supervision Stand pivot transfers: Supervision       General transfer comment: supervision for safety, increased time and effort  Ambulation/Gait Ambulation/Gait assistance: Min guard Gait Distance (Feet): 40 Feet Assistive device: None Gait Pattern/deviations: Step-through pattern;Decreased stride length;Wide base  of support Gait velocity: decreased Gait velocity interpretation: <1.31 ft/sec, indicative of household ambulator General Gait Details: fatigues quickly, occasional UE support on bed, sink, etc  Stairs            Wheelchair Mobility    Modified Rankin (Stroke Patients Only)       Balance Overall balance assessment: Needs assistance Sitting-balance support: No upper extremity supported;Feet supported Sitting balance-Leahy Scale: Good     Standing balance support: During functional activity;No upper extremity supported Standing balance-Leahy Scale: Fair Standing balance comment: occassional UE support during ambulation, only tolerates short distances                             Pertinent Vitals/Pain Pain Assessment: No/denies pain    Home Living Family/patient expects to be discharged to:: Private residence Living Arrangements: Other relatives(grandson) Available Help at Discharge: Family;Available PRN/intermittently Type of Home: House Home Access: Level entry     Home Layout: One level Home Equipment: Walker - 4 wheels;Bedside commode      Prior Function Level of Independence: Independent with assistive device(s)         Comments: Ambulates with rollator vs cane in community, no AD in house, uses electric scooter in grocery store.  Pt having difficulty with toilet hygeine.      Hand Dominance        Extremity/Trunk Assessment   Upper Extremity Assessment Upper Extremity Assessment: Overall WFL for tasks assessed    Lower Extremity Assessment Lower Extremity Assessment: Overall WFL for tasks assessed    Cervical / Trunk Assessment Cervical / Trunk Assessment: Normal  Communication  Communication: No difficulties  Cognition Arousal/Alertness: Awake/alert Behavior During Therapy: WFL for tasks assessed/performed Overall Cognitive Status: Within Functional Limits for tasks assessed                                         General Comments General comments (skin integrity, edema, etc.): VSS on RA    Exercises     Assessment/Plan    PT Assessment Patient needs continued PT services  PT Problem List Decreased balance;Obesity;Cardiopulmonary status limiting activity;Decreased mobility;Decreased activity tolerance       PT Treatment Interventions Functional mobility training;Balance training;Patient/family education;Gait training;Therapeutic activities;Therapeutic exercise    PT Goals (Current goals can be found in the Care Plan section)  Acute Rehab PT Goals Patient Stated Goal: aide for home PT Goal Formulation: With patient Time For Goal Achievement: 08/06/18 Potential to Achieve Goals: Good    Frequency Min 3X/week   Barriers to discharge        Co-evaluation               AM-PAC PT "6 Clicks" Mobility  Outcome Measure Help needed turning from your back to your side while in a flat bed without using bedrails?: None Help needed moving from lying on your back to sitting on the side of a flat bed without using bedrails?: A Little Help needed moving to and from a bed to a chair (including a wheelchair)?: None Help needed standing up from a chair using your arms (e.g., wheelchair or bedside chair)?: None Help needed to walk in hospital room?: A Little Help needed climbing 3-5 steps with a railing? : A Lot 6 Click Score: 20    End of Session   Activity Tolerance: Patient tolerated treatment well Patient left: in chair;with call bell/phone within reach Nurse Communication: Mobility status PT Visit Diagnosis: Difficulty in walking, not elsewhere classified (R26.2)    Time: 0912-0928 PT Time Calculation (min) (ACUTE ONLY): 16 min   Charges:   PT Evaluation $PT Eval Low Complexity: 1 Low          Aida RaiderWendy Oluwatosin Bracy, PT  Office # [phone] Pager [phone]   Ilda FoilGarrow, Murl Zogg Rene 07/23/2018, 9:41 AM

## 2018-07-23 NOTE — Progress Notes (Addendum)
   Subjective: No overnight events. Patient reports feeling okay this morning. She denies any further chest pain. Denies orthopnea. Her cough has improved with the help of the CPAP. She feels her legs are very tight and tender. She has been urinating a lot. She is tolerating PO intake well. All questions and concerns were addressed.   Objective:  Vital signs in last 24 hours: Vitals:   07/22/18 2011 07/23/18 0035 07/23/18 0626 07/23/18 0632  BP: 114/60  124/75   Pulse: 72 75 69   Resp: 20 (!) 21 19   Temp: 98.2 F (36.8 C)  98.2 F (36.8 C)   TempSrc: Oral  Oral   SpO2: 97% 95% 92%   Weight:    (!) 146.4 kg  Height:       Gen: seen comfortably lying in bed, no distress CV: no murmurs, RRR, JVD present to the mandible Pulm: diminished breath sounds at the bases Ext: bilateral pitting edema, improved compared to yesterday  Assessment/Plan:  Principal Problem:   Acute on chronic diastolic heart failure (HCC) Active Problems:   Essential hypertension   Morbid obesity (HCC)   Chest pain  Kerry Liu is a 68 yo female with a medical history of HTN, PE on xarelto, chronic diastolic CHF, HLD, obesity, prediabetes, OSA on CPAP who presented with left-sided chest pain that resolved spontaneously. She also endorsed one week of dyspnea, cough, and lower extremity tightness and was found to be volume overloaded on exam. She was admitted for further evaluation and management.   Atypical chest pain  - EKG without ischemic changes. Troponin negative x3. - No history of CAD. Risk factors include obesity, HTN, HLD, and prediabetes. - A1c 6; cholesterol 203, LDL 151 Plan - Monitor for further episodes of chest pain - Tele - F/u repeat EKG  Diastolic heart failure exacerbation - Pt continues to appear volume overloaded on exam - Satting well on room air. - No output recorded. - Last recorded weight one year ago was 316 lbs. Wt today 322.8lbs, 329.2lbs on admission. - Last ECHO in 2017  showed EF 65-70% with grade 1 diastolic dysfunction. ECHO on admission was a poor quality study 2/2 body habitus. LV systolic function grossly normal >50% with grade 1 diastolic dysfunction. - Home regimen: lasix 20mg  daily - PT recommends home health Plan - Lasix 20mg  IV BID - Daily BMP - Daily weights - Strict I/Os - ECHO  UTI - Dysuria has improved. Plan - Macrobid 100mg  BID (day 2/5)  AKI - Cr increased to 1.2 on admission (BL 0.9). Cr 1.02 today. Plan - Hold home losartan. Plan to restart tomorrow. - Trend BMP   HTN - 124/75 this am Plan - Continue home amlodipine 10mg  daily and metoprolol 50mg  daily - Hold losartan 2/2 AKI.  PE: continue Xarelto HLD: continue home lipitor 80mg  daily OSA: CPAP at night  Dispo: Anticipated discharge in approximately 2-3 day(s).   Kerry Liu, Cathleen Corti, MD 07/23/2018, 6:48 AM Pager: [phone]

## 2018-07-23 NOTE — Progress Notes (Signed)
  Date: 07/23/2018  Patient name: Kerry Liu  Medical record number: [id number]  Date of birth: 03-08-1951   I have seen and evaluated this patient and I have discussed the plan of care with the house staff. Please see their note for complete details. I concur with their findings with the following additions/corrections:   Please see my separate attestation of the H&P from 07/22/2018.  Jessy Oto, M.D., Ph.D. 07/23/2018, 3:56 PM

## 2018-07-23 NOTE — Telephone Encounter (Signed)
[facility]; pt appt. 02/03 1045am

## 2018-07-23 NOTE — Progress Notes (Signed)
Placed patient on CPAP via FFM, auto settings (max 20.0 min 8.0) cm H20. 21% Fio2  RN aware

## 2018-07-23 NOTE — Care Management Note (Addendum)
Case Management Note  Patient Details  Name: Kerry Liu MRN: [id number] Date of Birth: May 27, 1951  Subjective/Objective:  Pt presented for Chest Pain -treating for Acute on Chronic Diastolic Heart Failure. Patient states grandson is in the home. Patient states she has had difficulty with affording medications. Patient has been using a discount card. Patient will benefit from Transitions of Care [facility]) Pharmacy to ensure that patient receives medications before she leaves the hospital. PT/OT consulted and recommendations are for [facility].                  Action/Plan: CM did discuss the plan of care for [facility]. Patient has used [facility] In the past and wants to use again. Pt will benefit from [facility] RN Disease Management, PT, Aide and CSW. Referral was sent to [facility] with [facility] and [facility] to begin once patient has her initial appointment at the Internal Medicine Clinic. Patient states she will try to get Medicaid. CM did ask for [facility] CSW to help navigate the patient around DSS. No further needs from CM at this time.   Expected Discharge Date:                  Expected Discharge Plan:  Home w Home Health Services  In-House Referral:  NA  Discharge planning Services  CM Consult  Post Acute Care Choice:  Home Health Choice offered to:  Patient  DME Arranged:  N/A DME Agency:  NA  HH Arranged:  PT, Nurse's Aide, RN, Disease Management, Social Work [employer] Agency:  [facility]  Status of Service:  Completed, signed off  If discussed at [employer], dates discussed:    Additional Comments: 1557 07-25-18 Tomi Bamberger, RN,BSN [phone] CM did provide patient with a scale prior to transition home. Patient was provided the Heart Failure packet and CM reached out to [facility] to discuss diet and fluid restrictions in the home. No further needs from CM at this time.  Gala Lewandowsky, RN 07/23/2018, 11:48 AM

## 2018-07-24 DIAGNOSIS — N390 Urinary tract infection, site not specified: Secondary | ICD-10-CM

## 2018-07-24 LAB — BASIC METABOLIC PANEL
Anion gap: 11 (ref 5–15)
BUN: 18 mg/dL (ref 8–23)
CO2: 27 mmol/L (ref 22–32)
Calcium: 8.9 mg/dL (ref 8.9–10.3)
Chloride: 102 mmol/L (ref 98–111)
Creatinine, Ser: 0.98 mg/dL (ref 0.44–1.00)
GFR calc Af Amer: >60 mL/min (ref >60)
GFR calc non Af Amer: >60 mL/min (ref >60)
Glucose, Bld: 119 mg/dL — ABNORMAL HIGH (ref 70–99)
Potassium: 3.7 mmol/L (ref 3.5–5.1)
Sodium: 140 mmol/L (ref 135–145)

## 2018-07-24 LAB — HIV ANTIBODY (ROUTINE TESTING W REFLEX): HIV Screen 4th Generation wRfx: NONREACTIVE

## 2018-07-24 MED ORDER — LOSARTAN POTASSIUM 25 MG PO TABS
25.0000 mg | ORAL_TABLET | Freq: Every day | ORAL | Status: DC
  Administered 2018-07-24 – 2018-07-25 (×2): 25 mg via ORAL
  Filled 2018-07-24 (×2): qty 1

## 2018-07-24 MED ORDER — FUROSEMIDE 20 MG PO TABS: 20.0000 mg | ORAL_TABLET | Freq: Every day | ORAL | Status: DC

## 2018-07-24 MED ORDER — FUROSEMIDE 20 MG PO TABS
20.0000 mg | ORAL_TABLET | Freq: Every day | ORAL | Status: DC
  Administered 2018-07-24 – 2018-07-25 (×2): 20 mg via ORAL
  Filled 2018-07-24 (×2): qty 1

## 2018-07-24 NOTE — Progress Notes (Signed)
Patient is on NIV at this time tolerating it well, no distress or complications noted.

## 2018-07-24 NOTE — Discharge Summary (Signed)
Name: Kerry Liu MRN: [id number] DOB: 02/04/1951 68 y.o. PCP: Patient, No Pcp Per  Date of Admission: 07/22/2018  2:15 AM Date of Discharge: 07/25/2018 Attending Physician: Dr. Sandre Kittyaines  Discharge Diagnosis: 1. Diastolic heart failure exacerbation 2. Atypical chest pain 3. UTI 4. AKI 5. HTN 6. History of PE 7. Prediabetes 8. HLD 9. OSA   Discharge Medications: Allergies as of 07/25/2018      Reactions   Codeine Other (See Comments)   Unknown   Hydrocodone    Itch   Oxycodone Rash   Tramadol Rash      Medication List    TAKE these medications   acetaminophen-codeine 300-30 MG tablet Commonly known as:  TYLENOL #3 Take 1 tablet by mouth every 6 (six) hours as needed for moderate pain.   albuterol (2.5 MG/3ML) 0.083% nebulizer solution Commonly known as:  PROVENTIL Take 3 mLs (2.5 mg total) by nebulization every 6 (six) hours as needed for wheezing or shortness of breath.   amLODipine 10 MG tablet Commonly known as:  NORVASC Take 1 tablet (10 mg total) by mouth daily.   atorvastatin 80 MG tablet Commonly known as:  LIPITOR Take 0.5 tablets (40 mg total) by mouth every evening.   furosemide 40 MG tablet Commonly known as:  LASIX Take 0.5 tablets (20 mg total) by mouth daily.   losartan 25 MG tablet Commonly known as:  COZAAR Take 1 tablet (25 mg total) by mouth daily. Start taking on:  July 26, 2018 What changed:    medication strength  how much to take  Another medication with the same name was removed. Continue taking this medication, and follow the directions you see here.   metoprolol tartrate 50 MG tablet Commonly known as:  LOPRESSOR Take 1 tablet (50 mg total) by mouth 2 (two) times daily.   multivitamin tablet Take 1 tablet by mouth daily.   nitrofurantoin (macrocrystal-monohydrate) 100 MG capsule Commonly known as:  MACROBID Take 1 capsule (100 mg total) by mouth every 12 (twelve) hours.   rivaroxaban 20 MG Tabs tablet Commonly  known as:  XARELTO Take 1 tablet (20 mg total) by mouth daily with supper.   Vitamin D3 125 MCG (5000 UT) Caps Take 5,000 Units by mouth daily.       Disposition and follow-up:   [name].  At the hospital follow up visit please 2address:  1. Diastolic heart failure exacerbation - Weight at discharge 321lbs - Discharged on 20mg  lasix daily - Please evaluate volume status at hospital f/u.  2. HTN - Losartan initially held 2/2 AKI. Resumed at lower dose of 25mg  instead of 100mg  because patient had been normotensive without this medication. Restarted due to heart failure benefits.   3. History of PE - Please ensure patient is taking her Xarelto  4. Prediabetes - Patient will need counseling on prediabetes and f/u A1c   5. OSA - Please ensure patient is wearing CPAP every night at home.  2.  Labs / imaging needed at time of follow-up: A1c in at least 3 months  3.  Pending labs/ test needing follow-up: none  Follow-up Appointments: Follow-up Information    [facility] Follow up on 07/30/2018.   Why:  @ 10:15 am for hospital follow up appointment. Provider will be desingated day of visit. Patient may can get assistance with medications via the Health Department once established at the clinic.  Contact information:  [address] [place] [id number] [phone]       [facility].   Specialty:  Home Health Services Why:  Registered Nurse, Physical Therapy, Aide, Child psychotherapistocial Worker.  Contact information: [address] [place] [id number] [phone]           Hospital Course by problem list: [name] is a 68 yo female with a medical history of HTN, PE on xarelto, chronic diastolic CHF,HLD, obesity, prediabetes, OSA on CPAP who presented with left-sided chest pain that resolved spontaneously. She also endorsed one week  of dyspnea, cough, and lower extremity tightness and was found to be volume overloaded on exam. She was admitted for further evaluation and management.   1. Diastolic heart failure exacerbation: Found to be volume overloaded on exam with JVD, BLE pitting edema, and pulmonary edema on chest xray. Not hypoxic and remained on room air throughout hospital stay. ECHO on admission was a poor quality study 2/2 body habitus. LV systolic function grossly normal >50% with grade 1 diastolic dysfunction. She was diuresed with IV lasix. Weight at discharge 321 lbs. Discharged home with lasix 20mg  daily. Prior to admission, she had run out of many of her medications because she does not have a PCP at this time. Her medications were provided via the transitions of care pharmacy at discharge. Home health, PT, and SW was also arranged. She will follow-up with the internal medicine clinic on 2/3 to establish care.   2. Atypical chest pain: No ischemic changes on EKG. Troponins negative x4. No PE on CTA. Most likely MSK.   3. UTI: Treated with 5-day course of macrobid.  4. AKI: Cr increased to 1.2 on admission (BL 0.9). Resolved with diuresis.   5. HTN: Normotensive during admission. Continued home amlodipine 10mg  daily and metoprolol 50mg  daily. Decreased losartan from 100mg  to 25mg  daily due to normal blood pressures.   6. History of PE: continued home Xarelto  7. Prediabetes: A1c 6.0. Not on any therapy. Will establish care with PCP next week for further management.   8. HLD: Cholesterol 203. LDL 151. Continued home lipitor  9. OSA: Patient should be wearing CPAP every night at home.    Discharge Vitals:   BP (!) 108/51   Pulse 74   Temp 98.2 F (36.8 C)   Resp 18   Ht 5\' 2"  (1.575 m)   Wt (!) 146 kg   SpO2 91%   BMI 58.87 kg/m   Pertinent Labs, Studies, and Procedures:  CBC Latest Ref Rng & Units 07/22/2018 12/30/2017 08/10/2017  WBC 4.0 - 10.5 K/uL 7.2 6.4 5.3  Hemoglobin 12.0 - 15.0 g/dL 84.113.6 32.413.3  40.113.2  Hematocrit 36.0 - 46.0 % 45.2 44.0 40.4  Platelets 150 - 400 K/uL 255 243 252   CMP Latest Ref Rng & Units 07/25/2018 07/24/2018 07/23/2018  Glucose 70 - 99 mg/dL 027(O122(H) 536(U119(H) 440(H118(H)  BUN 8 - 23 mg/dL 17 18 18   Creatinine 0.44 - 1.00 mg/dL 4.74(Q1.05(H) 5.950.98 6.38(V1.02(H)  Sodium 135 - 145 mmol/L 141 140 140  Potassium 3.5 - 5.1 mmol/L 3.6 3.7 3.9  Chloride 98 - 111 mmol/L 105 102 105  CO2 22 - 32 mmol/L 28 27 27   Calcium 8.9 - 10.3 mg/dL 8.9 8.9 9.0  Total Protein 6.1 - 8.1 g/dL - - -  Total Bilirubin 0.2 - 1.2 mg/dL - - -  Alkaline Phos 33 - 130 U/L - - -  AST 10 - 35 U/L - - -  ALT 6 - 29 U/L - - -   ECHO 07/22/2018 - Left ventricle: Not well visualized. LV poorly visualized even   with echocontrast. Grossly LV systolic function is normal, >50%   Doppler parameters are consistent with abnormal left ventricular   relaxation (grade 1 diastolic dysfunction). - Aortic valve: Valve area (VTI): 3.63 cm^2. Valve area (Vmax):   2.83 cm^2. Valve area (Vmean): 2.89 cm^2. - Technically difficult study. Echocontrast was used to enhance   visualization. Limited visualization even with echocontrast,   suboptimal study.  Discharge Instructions: Discharge Instructions    Diet - low sodium heart healthy   Complete by:  As directed    Discharge instructions   Complete by:  As directed    It was a pleasure taking care of you during your hospital stay, Kerry Liu!  1. You were hospitalized for a heart failure exacerbation. You were treated with IV medications. You are safe to go home.   2. Avoid salty foods. Avoid drinking lots of fluids.   3. Weigh yourself everyday and keep a log of your weights. If you find that your weight is creeping up, see your PCP.  4. You were also treated with antibiotics for a UTI. You have three doses of the antibiotic Macrobid left. Take one dose tonight and two tomorrow.   5. The only changes to your medications made during your hospital stay: - decreased losartan  from 100mg  daily to 25mg  daily  6. Please follow up with the internal medicine clinic (located on the ground floor of the hospital under the ED) on February 3rd.   Feel free to call our clinic if you have any questions at [phone].  Thanks, Dr. Avie Arenas   Increase activity slowly   Complete by:  As directed       Signed: Iviona Hole, Cathleen Corti, MD 07/25/2018, 11:31 AM   Pager: [phone]

## 2018-07-24 NOTE — Progress Notes (Signed)
  Date: 07/24/2018  Patient name: Kerry Liu  Medical record number: [id number]  Date of birth: 10-Jun-1951   I have seen and evaluated this patient and I have discussed the plan of care with the house staff. Please see their note for complete details. I concur with their findings with the following additions/corrections:   Doing well today, breathing seems to be back to normal.  Still having some lower extremity edema and it that is painful and tight.  On exam, she also still has somewhat elevated JVP, although it is improved from yesterday.  She ready received a dose of IV furosemide this morning, will transition to oral furosemide and ensure she continues to diuresis and plan for discharge tomorrow.  We have restarted a low-dose of losartan.  Losartan has some evidence for decreased hospitalizations in HFpEF, so if her blood pressure does not tolerate restarting, could consider lowering amlodipine dose to allow losartan.  Jessy Oto, M.D., Ph.D. 07/24/2018, 1:57 PM

## 2018-07-24 NOTE — Discharge Instructions (Addendum)

## 2018-07-24 NOTE — Progress Notes (Signed)
    Subjective: Patient doing well this AM. Tolerating her diet. Feels her breathing is back to normal. Her LE edema and pain is persistent. Feeling well with consistent use of CPAP. Discussed the importance of doing so at home.   Objective: Vital signs in last 24 hours: Vitals:   07/23/18 1203 07/23/18 2059 07/24/18 0143 07/24/18 0611  BP: 112/75 121/75  125/72  Pulse: (!) 59 80 60 73  Resp: 20 20 18 18   Temp: 98.5 F (36.9 C) 98.2 F (36.8 C)  98.3 F (36.8 C)  TempSrc: Oral Oral  Oral  SpO2: (!) 89% 91% 95% 95%  Weight:      Height:       General: Obese female in no acute distress HENT: Difficult to assess for JVD.  Pulm: Good air movement with no wheezing or crackles  CV: RRR, no murmurs, no rubs   Extremities: Mild to moderate pitting edema of the bilateral LEs, tenderness to palpation bilaterally   Assessment/Plan:  Principal Problem:   Acute on chronic diastolic heart failure (HCC) Active Problems:   Essential hypertension   Morbid obesity (HCC)   History of pulmonary embolus (PE)   OSA on CPAP   Chest pain   Acute cystitis  Kerry Liu is a 68 yo female with a medical history of HTN, PE on xarelto, chronic diastolic CHF,HLD, obesity, prediabetes, OSA on CPAP who presented with left-sided chest pain that resolved spontaneously. She also endorsed one week of dyspnea, cough, and lower extremity tightness and was found to be volume overloaded on exam. She was admitted for further evaluation and management.   Atypical chest pain  - EKG without ischemic changes. Troponin negative x4. - No history of CAD. Risk factors include obesity, HTN, HLD, and prediabetes. Plan - Monitor for further episodes of chest pain - Tele  Diastolic heart failure exacerbation - Pt continues to appear volume overloaded on exam, but improved overall - Satting well on room air. - Diuresed 2L plus 3 unmeasured urine occurrences  - Last recorded weight one year ago was 316 lbs. Wt today  322 lbs, 329.2lbs on admission. - PT recommends home health Plan - Lasix 20mg  IV once this am. Transition to PO lasix 20mg  for evening dose. - Daily BMP - Daily weights - Strict I/Os  UTI - Dysuria has improved. Plan - Macrobid 100mg  BID (day 3/5)  AKI - Resolved Plan - Resume home losartan at reduced dose - Trend BMP   HTN - 125/72 this am Plan - Continue home amlodipine 10mg  daily and metoprolol 50mg  daily - Resume home losartan at reduced dose 2/2 normotension  PE: continue Xarelto TXH:FSFSELTR home lipitor 80mg  daily OSA: CPAP at night  Dispo: Anticipated discharge in approximately 1 day.   Dorrell, Cathleen Corti, MD 07/24/2018, 6:45 AM Pager: [phone]

## 2018-07-25 DIAGNOSIS — Z6841 Body Mass Index (BMI) 40.0 and over, adult: Secondary | ICD-10-CM

## 2018-07-25 LAB — BASIC METABOLIC PANEL
Anion gap: 8 (ref 5–15)
BUN: 17 mg/dL (ref 8–23)
CO2: 28 mmol/L (ref 22–32)
Calcium: 8.9 mg/dL (ref 8.9–10.3)
Chloride: 105 mmol/L (ref 98–111)
Creatinine, Ser: 1.05 mg/dL — ABNORMAL HIGH (ref 0.44–1.00)
GFR calc Af Amer: >60 mL/min (ref >60)
GFR calc non Af Amer: 56 mL/min — ABNORMAL LOW (ref >60)
Glucose, Bld: 122 mg/dL — ABNORMAL HIGH (ref 70–99)
Potassium: 3.6 mmol/L (ref 3.5–5.1)
Sodium: 141 mmol/L (ref 135–145)

## 2018-07-25 MED ORDER — LOSARTAN POTASSIUM 25 MG PO TABS: 25.0000 mg | ORAL_TABLET | Freq: Every day | ORAL | 0 refills | Status: DC

## 2018-07-25 MED ORDER — NITROFURANTOIN MONOHYD MACRO 100 MG PO CAPS: 100.0000 mg | ORAL_CAPSULE | Freq: Two times a day (BID) | ORAL | 0 refills | Status: DC

## 2018-07-25 MED ORDER — AMLODIPINE BESYLATE 10 MG PO TABS: 10.0000 mg | ORAL_TABLET | Freq: Every day | ORAL | 0 refills | Status: DC

## 2018-07-25 MED ORDER — ATORVASTATIN CALCIUM 80 MG PO TABS: 40.0000 mg | ORAL_TABLET | Freq: Every evening | ORAL | 0 refills | Status: DC

## 2018-07-25 MED ORDER — FUROSEMIDE 40 MG PO TABS: 20.0000 mg | ORAL_TABLET | Freq: Every day | ORAL | 0 refills | Status: DC

## 2018-07-25 MED ORDER — RIVAROXABAN 20 MG PO TABS: 20.0000 mg | ORAL_TABLET | Freq: Every day | ORAL | 0 refills | Status: DC

## 2018-07-25 MED FILL — XARELTO 20 MG TABLET: 20 | 30 days supply | Qty: 30 | Fill #0

## 2018-07-25 MED FILL — FUROSEMIDE 40 MG TABLET: 40 | 60 days supply | Qty: 30 | Fill #0

## 2018-07-25 MED FILL — ATORVASTATIN CALCIUM 80 MG: 80 | 60 days supply | Qty: 30 | Fill #0

## 2018-07-25 MED FILL — LOSARTAN POTASSIUM 25 MG TA: 25 | 30 days supply | Qty: 30 | Fill #0

## 2018-07-25 MED FILL — NITROFURANTOIN MONO-MCR 100: 100 | 3 days supply | Qty: 3 | Fill #0

## 2018-07-25 MED FILL — AMLODIPINE BESYLATE 10 MG T: 10 | 30 days supply | Qty: 30 | Fill #0

## 2018-07-25 NOTE — Progress Notes (Signed)
Physical Therapy Treatment Patient Details Name: Kerry Liu MRN: [id number] DOB: 06-06-1951 Today's Date: 07/25/2018    History of Present Illness 68 yo female with a medical history of HTN, PE on xarelto, chronic diastolic CHF (EF 09-98% with grade 1 diastolic dysfunction on 2017 ECHO), HLD, obesity, prediabetes, OSA on CPAP who presented to the ED with sharp 6/10 non-radiating chest pain. Chest CTA negative for acute findings and troponin negative.     PT Comments    Progress well with gait stability and stamina.  No overt dyspnea, but needing stating rests.  Pt asked for some assist and information of CHF and specifically healthy eating while on CHF protocol.    Follow Up Recommendations  Home health PT;Supervision - Intermittent     Equipment Recommendations  None recommended by PT    Recommendations for Other Services       Precautions / Restrictions Precautions Precautions: Fall    Mobility  Bed Mobility               General bed mobility comments: up OOB on arrival  Transfers Overall transfer level: Needs assistance Equipment used: None Transfers: Sit to/from Stand Sit to Stand: Supervision            Ambulation/Gait Ambulation/Gait assistance: Supervision Gait Distance (Feet): 200 Feet Assistive device: None Gait Pattern/deviations: Step-through pattern Gait velocity: able to increase speeds significantly Gait velocity interpretation: >2.62 ft/sec, indicative of community ambulatory General Gait Details: still fatiguing quickly, needing rest breaks x3, but recovers fairly quickly.   Stairs             Wheelchair Mobility    Modified Rankin (Stroke Patients Only)       Balance Overall balance assessment: Needs assistance Sitting-balance support: No upper extremity supported;Feet supported Sitting balance-Leahy Scale: Good     Standing balance support: During functional activity;No upper extremity supported Standing  balance-Leahy Scale: Fair                              Cognition Arousal/Alertness: Awake/alert Behavior During Therapy: WFL for tasks assessed/performed Overall Cognitive Status: Within Functional Limits for tasks assessed                                        Exercises      General Comments General comments (skin integrity, edema, etc.): Dscussion of aspects of the CHF protocol.  Pt asking questions about what is good to eat.  Asked Case mgmt to get a consult for CHF education and nutrition.      Pertinent Vitals/Pain Pain Assessment: Faces Faces Pain Scale: No hurt    Home Living                      Prior Function            PT Goals (current goals can now be found in the care plan section) Acute Rehab PT Goals Patient Stated Goal: aide for home PT Goal Formulation: With patient Time For Goal Achievement: 08/06/18 Potential to Achieve Goals: Good Progress towards PT goals: Progressing toward goals    Frequency    Min 3X/week      PT Plan Current plan remains appropriate    Co-evaluation              AM-PAC PT "6 Clicks" Mobility  Outcome Measure  Help needed turning from your back to your side while in a flat bed without using bedrails?: None Help needed moving from lying on your back to sitting on the side of a flat bed without using bedrails?: A Little Help needed moving to and from a bed to a chair (including a wheelchair)?: None Help needed standing up from a chair using your arms (e.g., wheelchair or bedside chair)?: None Help needed to walk in hospital room?: A Little Help needed climbing 3-5 steps with a railing? : A Little 6 Click Score: 21    End of Session   Activity Tolerance: Patient tolerated treatment well Patient left: in chair;with call bell/phone within reach Nurse Communication: Mobility status PT Visit Diagnosis: Difficulty in walking, not elsewhere classified (R26.2)     Time:  5916-3846 PT Time Calculation (min) (ACUTE ONLY): 21 min  Charges:  $Gait Training: 8-22 mins                     07/25/2018  [place] Bing, PT Acute Rehabilitation Services [phone]  (pager) [phone]  (office)   Eliseo Gum Saroya Riccobono 07/25/2018, 4:36 PM

## 2018-07-25 NOTE — Progress Notes (Signed)
   Subjective: No overnight events. Ms. Halterman reports that her breathing is baseline and her lower legs feel less tight although they are still tender. All of her questions were answered.   Objective:  Vital signs in last 24 hours: Vitals:   07/24/18 0812 07/24/18 1439 07/24/18 2100 07/25/18 0616  BP: 127/86 130/86 128/78 112/60  Pulse: 60 78 82 74  Resp:  18 20 18   Temp:  98.2 F (36.8 C) 98 F (36.7 C) 98.2 F (36.8 C)  TempSrc:  Oral Oral   SpO2:  97% 94% 91%  Weight:    (!) 146 kg  Height:       Gen: obese female, no distress Pulm: decreased breath sounds, normal effort on room air CV: RRR, no murmurs, JVD less visible today Ext: trace edema BLE, ttp  Assessment/Plan:  Principal Problem:   Acute on chronic diastolic heart failure (HCC) Active Problems:   Essential hypertension   Morbid obesity (HCC)   History of pulmonary embolus (PE)   OSA on CPAP   Chest pain   Acute cystitis  Ms. Mohrbacher is a 68 yo female with a medical history of HTN, PE on xarelto, chronic diastolic CHF,HLD, obesity, prediabetes, OSA on CPAP who presented with left-sided chest pain that resolved spontaneously. She also endorsed one week of dyspnea, cough, and lower extremity tightness and was found to be volume overloaded on exam. She was admitted for further evaluation and management.   Atypical chest pain  - EKG without ischemic changes. Troponin negative x4. - No history of CAD. Risk factors include obesity, HTN, HLD, and prediabetes. Plan - Monitor for further episodes of chest pain - Tele  Diastolic heart failure exacerbation - Ptcontinues to appear volume overloaded on exam, but improved overall - Satting well on room air. - Inaccurate output documented (reports only , patient believes it was more) - Weight decreased from 329 on admission to 321 today (8lb loss) Plan - Discharge home today on lasix 20mg  daily - Daily BMP - Daily weights - Strict I/Os  UTI -Dysuria  has improved. Plan - Macrobid 100mg  BID(day 4/5)  AKI - Resolved Plan - Trend BMP   HTN -125/72 this am Plan - Continue home amlodipine 10mg  daily and metoprolol 50mg  daily - Continue home losartan at reduced dose   PE: continue Xarelto FGH:[id number] home lipitor 80mg  daily OSA: CPAP at night  Dispo: Anticipated discharge today  Maggy Wyble, Cathleen Corti, MD 07/25/2018, 6:52 AM Pager: [phone]

## 2018-07-27 DIAGNOSIS — N390 Urinary tract infection, site not specified: Secondary | ICD-10-CM | POA: Diagnosis not present

## 2018-07-27 DIAGNOSIS — Z79891 Long term (current) use of opiate analgesic: Secondary | ICD-10-CM | POA: Diagnosis not present

## 2018-07-27 DIAGNOSIS — I5033 Acute on chronic diastolic (congestive) heart failure: Secondary | ICD-10-CM | POA: Diagnosis not present

## 2018-07-27 DIAGNOSIS — Z7901 Long term (current) use of anticoagulants: Secondary | ICD-10-CM | POA: Diagnosis not present

## 2018-07-27 DIAGNOSIS — N179 Acute kidney failure, unspecified: Secondary | ICD-10-CM | POA: Diagnosis not present

## 2018-07-30 ENCOUNTER — Encounter: Payer: Self-pay | Admitting: Internal Medicine

## 2018-07-30 ENCOUNTER — Ambulatory Visit (INDEPENDENT_AMBULATORY_CARE_PROVIDER_SITE_OTHER): Payer: Medicare Other | Admitting: Internal Medicine

## 2018-07-30 ENCOUNTER — Ambulatory Visit: Payer: Medicare Other | Admitting: Pharmacist

## 2018-07-30 ENCOUNTER — Other Ambulatory Visit: Payer: Self-pay

## 2018-07-30 VITALS — BP 106/63 | HR 76 | Temp 98.3°F | Ht 62.0 in | Wt 323.7 lb

## 2018-07-30 DIAGNOSIS — Z7901 Long term (current) use of anticoagulants: Secondary | ICD-10-CM

## 2018-07-30 DIAGNOSIS — G4733 Obstructive sleep apnea (adult) (pediatric): Secondary | ICD-10-CM | POA: Diagnosis not present

## 2018-07-30 DIAGNOSIS — Z86718 Personal history of other venous thrombosis and embolism: Secondary | ICD-10-CM | POA: Diagnosis not present

## 2018-07-30 DIAGNOSIS — Z79899 Other long term (current) drug therapy: Secondary | ICD-10-CM

## 2018-07-30 DIAGNOSIS — Z87440 Personal history of urinary (tract) infections: Secondary | ICD-10-CM

## 2018-07-30 DIAGNOSIS — I110 Hypertensive heart disease with heart failure: Secondary | ICD-10-CM | POA: Diagnosis not present

## 2018-07-30 DIAGNOSIS — Z299 Encounter for prophylactic measures, unspecified: Secondary | ICD-10-CM

## 2018-07-30 DIAGNOSIS — N3000 Acute cystitis without hematuria: Secondary | ICD-10-CM

## 2018-07-30 DIAGNOSIS — R7303 Prediabetes: Secondary | ICD-10-CM

## 2018-07-30 DIAGNOSIS — Z86711 Personal history of pulmonary embolism: Secondary | ICD-10-CM | POA: Diagnosis not present

## 2018-07-30 DIAGNOSIS — I5032 Chronic diastolic (congestive) heart failure: Secondary | ICD-10-CM | POA: Diagnosis not present

## 2018-07-30 DIAGNOSIS — Z91120 Patient's intentional underdosing of medication regimen due to financial hardship: Secondary | ICD-10-CM

## 2018-07-30 DIAGNOSIS — I10 Essential (primary) hypertension: Secondary | ICD-10-CM | POA: Diagnosis not present

## 2018-07-30 DIAGNOSIS — E782 Mixed hyperlipidemia: Secondary | ICD-10-CM | POA: Diagnosis not present

## 2018-07-30 DIAGNOSIS — Z23 Encounter for immunization: Secondary | ICD-10-CM | POA: Diagnosis not present

## 2018-07-30 DIAGNOSIS — Z9989 Dependence on other enabling machines and devices: Secondary | ICD-10-CM

## 2018-07-30 DIAGNOSIS — E559 Vitamin D deficiency, unspecified: Secondary | ICD-10-CM

## 2018-07-30 DIAGNOSIS — I700 Atherosclerosis of aorta: Secondary | ICD-10-CM | POA: Insufficient documentation

## 2018-07-30 MED ORDER — FUROSEMIDE 20 MG PO TABS: 20.0000 mg | ORAL_TABLET | Freq: Every day | ORAL | 1 refills | Status: DC

## 2018-07-30 MED ORDER — LOSARTAN POTASSIUM 25 MG PO TABS: 25.0000 mg | ORAL_TABLET | Freq: Every day | ORAL | 0 refills | Status: DC

## 2018-07-30 MED ORDER — ATORVASTATIN CALCIUM 80 MG PO TABS: 40.0000 mg | ORAL_TABLET | Freq: Every evening | ORAL | 0 refills | Status: DC

## 2018-07-30 MED ORDER — AMLODIPINE BESYLATE 10 MG PO TABS: 10.0000 mg | ORAL_TABLET | Freq: Every day | ORAL | 1 refills | Status: DC

## 2018-07-30 NOTE — Assessment & Plan Note (Signed)
Lipid profile during recent admission showed cholesterol of 203 and LDL of 151.  She was started on a high intensity statin.  Will need repeat lipid profile in 6 to 8 weeks. -Continue atorvastatin 40 mg daily -Follow lipid profile in 6 to 8 weeks

## 2018-07-30 NOTE — Assessment & Plan Note (Signed)
Patient found to have an acute kidney injury during her admission in the setting of acute heart failure exacerbation.  This improved with diuresis and her creatinine was 1.05 on day of discharge.  At home, she was on amlodipine 10 and losartan 100 mg daily.  She had not been taking the metoprolol listed on her medication list. Her losartan was decreased to 25 mg daily.  Her blood pressure is at goal today.  Therefore I will not make any adjustments in her antihypertensive regimen at this time.   -Continue amlodipine 10 mg daily and losartan 25 mg daily -Follow-up BMP

## 2018-07-30 NOTE — Assessment & Plan Note (Signed)
Patient is compliAnce with CPAP at home. She denies morning headaches, daytime somnolence, and fatigue.  DME order placed for mouth pads.

## 2018-07-30 NOTE — Assessment & Plan Note (Addendum)
Patient found to have A1c of 6 during recent admission.  Discussed importance of low carbohydrate, low sugar diet, and weight loss.  Will refer to nutritional services for further counseling.

## 2018-07-30 NOTE — Assessment & Plan Note (Signed)
Patient treated for a UTI during her most recent admission with Macrobid for 5 days.  She completed treatment 2 days after being discharged and reports resolution of urinary symptoms.

## 2018-07-30 NOTE — Assessment & Plan Note (Addendum)
Kerry Liu was admitted 1/26-1/29 for an acute diastolic heart failure exacerbation presenting as progressive dyspnea, dry cough, lower extremity edema, and chest pain.  Her ischemic work-up including troponin x3 and EKG were unremarkable.  Unfortunately, her echocardiogram was limited due to her body habitus.  It did show an EF greater than 50% and G1DD.  She responded well to diuresis with IV Lasix with decrease in weight of 8 pounds (329-> 321) during her admission.  She was also found to have an AKI and her blood pressure medications were adjusted (please see hypertension A&P).  Discharge weight is 321 pounds.   She has been doing well since discharge from the hospital.  Reports improvement in shortness of breath, lower extremity edema, and ambulation.  Able to sleep flat in bed.  Denies ongoing chest pain, cough, and orthopnea.  She has been weighing herself daily and reports no major increases in weight (329-191).  She has been taking her discharge medications as prescribed.  These include amlodipine 10, atorvastatin 80, fish Lasix 20, losartan 25, and Xarelto 20.  She is requesting refills today as she was only given 30-day supplies.  She is also interested in learning more about a low-salt and low carbohydrate diet.  -Refilled above medications.  She also has an appointment with Dr. Selena Batten today for medication assistance as she is having issues affording medicines. -Discussed importance of low-salt diet as well as daily weights.  Advised to call clinic if weight increases by 3 pounds in 1 week or 5 pounds in 1 day -Referred for nutrition education

## 2018-07-30 NOTE — Assessment & Plan Note (Addendum)
Patient reports she is unable to afford medications at times due to financial difficulties. She is meeting with Dr. Selena Batten today to address this. Appreciate her help. I sent her medications to [facility] per patient's request, but may benefit from a mail-order pharmacy.

## 2018-07-30 NOTE — Assessment & Plan Note (Addendum)
Patient had knee surgery in 2014 and soon after that she developed RLE DVT with bilateral pulmonary emboli. An IVC filter was placed by IR and she was started on Xarelto. She had a second episode of in 2017. CTA showed saddle PE with R heart strain, this time without a clear risk factor except for obesity. Her AC was changed to Eliquis during that admission. A CT venogram was done at the time which showed entrapped clot in IVC filter without occlusion.   During a visit with her PCP in 08/2015, her [place] Day was then switched to Xarelto again due to inability to afford Eliquis. She has been receiving Xarelto samples since then. Per Dr. Reginia Naas note from 2018 , IVC filter is still in place and cannot be removed.   She received a 30 day supply of Xarelto during her most recent admission and reports compliance with it. Denies hematuria, hematochezia, and melena.  She has an appointment with Dr. Selena Batten today for medication management.   - Continue Xarelto 20 mg QD, appreciate Dr. Elmyra Ricks assistance  - If unable to afford or get free samples, will have to be switched to warfarin

## 2018-07-30 NOTE — Assessment & Plan Note (Signed)
Patient has a history of vitamin D deficiency with most recent vitamin D level 16 on 2017.  Used to be on vitamin D supplementation but reports not taking vitamin D " for a long time."  Will recheck level today and resume supplementation if indicated.

## 2018-07-30 NOTE — Progress Notes (Signed)
CC: Hospital follow up (HF exacerbation), establish care   HPI:  Kerry Liu is a 68 y.o. year-old female with PMH listed below who presents to clinic for hospital follow up (HF exacerbation) and to establish care. Please see problem based assessment and plan for further details.   Past Medical History:  Diagnosis Date  . Acute meniscal tear of knee    RIGHT  . Arthritis    "right knee" (11/21/2012)  . Elevated hemoglobin A1c   . Exertional shortness of breath    "this week" (11/21/2012)  . History of DVT (deep vein thrombosis)    IN PREGNANCY  . HTN (hypertension)    CARDIOLOGIST--  DR HOCHREIN-- CLEARANCE NOTE IN EPIC AND CHART FROM 08-21-2012  . Mixed hyperlipidemia   . Obesity   . Pulmonary embolism (HCC)    "3 today" (11/21/2012)  . RBBB (right bundle branch block with left anterior fascicular block)   . Seasonal allergies   . Sinus tachycardia   . Sleep apnea   . Vitamin D deficiency    Past Surgical History:  Procedure Laterality Date  . IVC filter placed in May 2014 N/A   . KNEE ARTHROSCOPY Right 12-09-2002  . KNEE ARTHROSCOPY WITH MEDIAL MENISECTOMY Right 10/05/2012   Procedure: KNEE ARTHROSCOPY WITH MEDIAL MENISECTOMY;  Surgeon: Jacki Cones, MD;  Location: [facility];  Service: Orthopedics;  Laterality: Right;  . TOTAL ABDOMINAL HYSTERECTOMY W/ BILATERAL SALPINGOOPHORECTOMY  ~ 1986   W/ BILATERAL SALPINGOOPHORECTOMY  . TRANSTHORACIC ECHOCARDIOGRAM  11-17-2010   MILD LVH/ EF 60-65%/ GRADE I DIASTOLIC DYSFUNCTION/ MILD LAE / MILD RAE    Review of Systems:   Review of Systems  Constitutional: Negative for chills and fever.  Respiratory: Positive for shortness of breath. Negative for cough and wheezing.   Cardiovascular: Positive for leg swelling. Negative for chest pain, palpitations, orthopnea and PND.  Gastrointestinal: Negative for abdominal pain, nausea and vomiting.  Neurological: Negative for dizziness and headaches.   Family  History  Problem Relation Age of Onset  . Heart attack Other   . Heart attack Father   . Hypertension Brother   . Diabetes Brother   . Stroke Brother   . Sudden death Brother    SH: Ms. Crisafulli lives at home with his nephew who is disabled. She is his primary care taker. She has some difficulties with her ADLs due to her body weight. She depends on friends to drive to her doctor's appointments. 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 She denies alcohol, tobacco, and drug use.    Physical Exam: Vitals:   07/30/18 0931  BP: 106/63  Pulse: 76  Temp: 98.3 F (36.8 C)  TempSrc: Oral  SpO2: 97%  Weight: (!) 323 lb 11.2 oz (146.8 kg)  Height: 5\' 2"  (1.575 m)    General: chronically ill appearing female who appears older than stated age in no acute distress  HENT: NCAT, neck supple and FROM, no LAD, OP clear without exudates or erythema, poor dentition  Eyes: anicteric sclera, PERRL Cardiac: regular rate and rhythm, nl S1/S2, no murmurs, rubs or gallops, no JVD  Pulm: CTAB, no wheezes or crackles, no increased work of breathing on room air  Abd: abdomen is obese, soft, NTND, normoactive bowel sounds Neuro: A&Ox3, CN II-XII intact, sensation intact in all four extremities, no motor deficits, no dysmetria  Ext: warm and well perfused, trace-1+ peripheral edema bilaterally  Assessment & Plan:   See Encounters Tab for problem based charting.  Patient discussed with Dr. Angelia Mould

## 2018-07-30 NOTE — Assessment & Plan Note (Signed)
Patient declined flu shot. HepC testing performed today.   Per patient, last PAP smear done in 2016 and normal. No record in Epic. PCP will also need to address mammogram.

## 2018-07-30 NOTE — Patient Instructions (Addendum)
Ms. Kennemore,  For your heart failure, make sure you weigh yourself every day.  If your weight increases 3 pounds in 1 week or 5 pounds in 1 day please give [place] a call as we may need to adjust your Lasix dose.  For your Xarelto, I will send a message to our pharmacist to see if we can help get this for free or cheaper.  I sent refills for all your medications to your pharmacy.  The new prescription is a Lasix 20 mg tablet so you do not have to cut this in half and can take the whole tablet from now on urine out of the current prescription you have at home.  I also ordered new mouth pads for your CPAP machine.   I also made a referral to our nutritionist here.  Please make sure to make that appointment.  We will give you a call with the results of the blood work today.  Please make sure to schedule a follow-up appointment in 1 month with the regular doctor they will assign you here in clinic.  Please call us if you have any questions or concerns.  - Dr. Evelene Croon

## 2018-07-31 ENCOUNTER — Telehealth: Payer: Self-pay

## 2018-07-31 DIAGNOSIS — I5033 Acute on chronic diastolic (congestive) heart failure: Secondary | ICD-10-CM | POA: Diagnosis not present

## 2018-07-31 DIAGNOSIS — N179 Acute kidney failure, unspecified: Secondary | ICD-10-CM | POA: Diagnosis not present

## 2018-07-31 DIAGNOSIS — N390 Urinary tract infection, site not specified: Secondary | ICD-10-CM | POA: Diagnosis not present

## 2018-07-31 DIAGNOSIS — Z79891 Long term (current) use of opiate analgesic: Secondary | ICD-10-CM | POA: Diagnosis not present

## 2018-07-31 DIAGNOSIS — Z7901 Long term (current) use of anticoagulants: Secondary | ICD-10-CM | POA: Diagnosis not present

## 2018-07-31 LAB — BMP8+ANION GAP
ANION GAP: 16 mmol/L (ref 10.0–18.0)
BUN/Creatinine Ratio: 23 (ref 12–28)
BUN: 23 mg/dL (ref 8–27)
CO2: 24 mmol/L (ref 20–29)
Calcium: 9.5 mg/dL (ref 8.7–10.3)
Chloride: 103 mmol/L (ref 96–106)
Creatinine, Ser: 0.99 mg/dL (ref 0.57–1.00)
GFR calc Af Amer: 69 mL/min/{1.73_m2} (ref >59)
GFR calc non Af Amer: 60 mL/min/{1.73_m2} (ref >59)
GLUCOSE: 163 mg/dL — AB (ref 65–99)
Potassium: 4.1 mmol/L (ref 3.5–5.2)
Sodium: 143 mmol/L (ref 134–144)

## 2018-07-31 LAB — HEPATITIS C ANTIBODY

## 2018-07-31 LAB — VITAMIN D 25 HYDROXY (VIT D DEFICIENCY, FRACTURES): Vit D, 25-Hydroxy: 14.5 ng/mL — ABNORMAL LOW (ref 30.0–100.0)

## 2018-07-31 LAB — MAGNESIUM: Magnesium: 2 mg/dL (ref 1.6–2.3)

## 2018-07-31 NOTE — Telephone Encounter (Signed)
Lm for rtc 

## 2018-07-31 NOTE — Telephone Encounter (Signed)
Kelly with [facility] requesting VO for PT. Please call back.  

## 2018-07-31 NOTE — Progress Notes (Addendum)
Patient was seen today in a co-visit today, no changes made for now. Patient reports not having prescription insurance coverage (only Medicare A and B), referred patient to [facility] medassist, will follow up with patient within 1 month.

## 2018-08-01 ENCOUNTER — Telehealth: Payer: Self-pay

## 2018-08-01 NOTE — Progress Notes (Signed)
Internal Medicine Clinic Attending  Case discussed with Dr. Santos-Sanchez at the time of the visit.  We reviewed the resident's history and exam and pertinent patient test results.  I agree with the assessment, diagnosis, and plan of care documented in the resident's note.    

## 2018-08-01 NOTE — Telephone Encounter (Signed)
Agree  Thank you

## 2018-08-01 NOTE — Telephone Encounter (Signed)
Lawson Fiscal with [facility] requesting VO for PT, nursing, and home health aide. Please call back.

## 2018-08-01 NOTE — Telephone Encounter (Signed)
Verbal auth given for:  HH PT 1 week 1, 2 week 1 and 1 week 5 HH Aide 2 week 3 HH RN 1 week 1, 2 week 2 and 1 week 6 Will route to PCP for agreement/denial. Kinnie FeilL. Shley Dolby, RN, BSN

## 2018-08-02 ENCOUNTER — Other Ambulatory Visit: Payer: Self-pay | Admitting: Internal Medicine

## 2018-08-02 DIAGNOSIS — Z79891 Long term (current) use of opiate analgesic: Secondary | ICD-10-CM | POA: Diagnosis not present

## 2018-08-02 DIAGNOSIS — N179 Acute kidney failure, unspecified: Secondary | ICD-10-CM | POA: Diagnosis not present

## 2018-08-02 DIAGNOSIS — Z7901 Long term (current) use of anticoagulants: Secondary | ICD-10-CM | POA: Diagnosis not present

## 2018-08-02 DIAGNOSIS — I5033 Acute on chronic diastolic (congestive) heart failure: Secondary | ICD-10-CM | POA: Diagnosis not present

## 2018-08-02 DIAGNOSIS — N390 Urinary tract infection, site not specified: Secondary | ICD-10-CM | POA: Diagnosis not present

## 2018-08-02 MED ORDER — CHOLECALCIFEROL 25 MCG (1000 UT) PO CAPS: 2000.0000 [IU] | ORAL_CAPSULE | Freq: Every day | ORAL | 2 refills | Status: DC

## 2018-08-02 NOTE — Telephone Encounter (Signed)
Pt did come to her appointment on 2/3 and saw her PCP, Dr Lovenia Kim. See office note.

## 2018-08-04 DIAGNOSIS — N390 Urinary tract infection, site not specified: Secondary | ICD-10-CM | POA: Diagnosis not present

## 2018-08-04 DIAGNOSIS — Z79891 Long term (current) use of opiate analgesic: Secondary | ICD-10-CM | POA: Diagnosis not present

## 2018-08-04 DIAGNOSIS — I5033 Acute on chronic diastolic (congestive) heart failure: Secondary | ICD-10-CM | POA: Diagnosis not present

## 2018-08-04 DIAGNOSIS — Z7901 Long term (current) use of anticoagulants: Secondary | ICD-10-CM | POA: Diagnosis not present

## 2018-08-04 DIAGNOSIS — N179 Acute kidney failure, unspecified: Secondary | ICD-10-CM | POA: Diagnosis not present

## 2018-08-07 DIAGNOSIS — N390 Urinary tract infection, site not specified: Secondary | ICD-10-CM | POA: Diagnosis not present

## 2018-08-07 DIAGNOSIS — Z7901 Long term (current) use of anticoagulants: Secondary | ICD-10-CM | POA: Diagnosis not present

## 2018-08-07 DIAGNOSIS — N179 Acute kidney failure, unspecified: Secondary | ICD-10-CM | POA: Diagnosis not present

## 2018-08-07 DIAGNOSIS — I5033 Acute on chronic diastolic (congestive) heart failure: Secondary | ICD-10-CM | POA: Diagnosis not present

## 2018-08-07 DIAGNOSIS — Z79891 Long term (current) use of opiate analgesic: Secondary | ICD-10-CM | POA: Diagnosis not present

## 2018-08-09 DIAGNOSIS — N390 Urinary tract infection, site not specified: Secondary | ICD-10-CM | POA: Diagnosis not present

## 2018-08-09 DIAGNOSIS — Z7901 Long term (current) use of anticoagulants: Secondary | ICD-10-CM | POA: Diagnosis not present

## 2018-08-09 DIAGNOSIS — I5033 Acute on chronic diastolic (congestive) heart failure: Secondary | ICD-10-CM | POA: Diagnosis not present

## 2018-08-09 DIAGNOSIS — N179 Acute kidney failure, unspecified: Secondary | ICD-10-CM | POA: Diagnosis not present

## 2018-08-09 DIAGNOSIS — Z79891 Long term (current) use of opiate analgesic: Secondary | ICD-10-CM | POA: Diagnosis not present

## 2018-08-13 DIAGNOSIS — I5033 Acute on chronic diastolic (congestive) heart failure: Secondary | ICD-10-CM | POA: Diagnosis not present

## 2018-08-13 DIAGNOSIS — Z7901 Long term (current) use of anticoagulants: Secondary | ICD-10-CM | POA: Diagnosis not present

## 2018-08-13 DIAGNOSIS — Z79891 Long term (current) use of opiate analgesic: Secondary | ICD-10-CM | POA: Diagnosis not present

## 2018-08-13 DIAGNOSIS — N179 Acute kidney failure, unspecified: Secondary | ICD-10-CM | POA: Diagnosis not present

## 2018-08-13 DIAGNOSIS — N390 Urinary tract infection, site not specified: Secondary | ICD-10-CM | POA: Diagnosis not present

## 2018-08-15 DIAGNOSIS — I5033 Acute on chronic diastolic (congestive) heart failure: Secondary | ICD-10-CM | POA: Diagnosis not present

## 2018-08-15 DIAGNOSIS — N390 Urinary tract infection, site not specified: Secondary | ICD-10-CM | POA: Diagnosis not present

## 2018-08-15 DIAGNOSIS — N179 Acute kidney failure, unspecified: Secondary | ICD-10-CM | POA: Diagnosis not present

## 2018-08-15 DIAGNOSIS — Z7901 Long term (current) use of anticoagulants: Secondary | ICD-10-CM | POA: Diagnosis not present

## 2018-08-15 DIAGNOSIS — Z79891 Long term (current) use of opiate analgesic: Secondary | ICD-10-CM | POA: Diagnosis not present

## 2018-08-16 ENCOUNTER — Telehealth: Payer: Self-pay | Admitting: *Deleted

## 2018-08-16 DIAGNOSIS — I5033 Acute on chronic diastolic (congestive) heart failure: Secondary | ICD-10-CM | POA: Diagnosis not present

## 2018-08-16 DIAGNOSIS — Z7901 Long term (current) use of anticoagulants: Secondary | ICD-10-CM | POA: Diagnosis not present

## 2018-08-16 DIAGNOSIS — N179 Acute kidney failure, unspecified: Secondary | ICD-10-CM | POA: Diagnosis not present

## 2018-08-16 DIAGNOSIS — Z79891 Long term (current) use of opiate analgesic: Secondary | ICD-10-CM | POA: Diagnosis not present

## 2018-08-16 DIAGNOSIS — N390 Urinary tract infection, site not specified: Secondary | ICD-10-CM | POA: Diagnosis not present

## 2018-08-16 NOTE — Telephone Encounter (Signed)
Call from [place], [facility] - calling about pt's BP being elevated. Right arm 172/102 Left arm 178/104 - taken 10 mins apart. Stated neuro intact, c/o headache, no change in weight; pt stated she has been taking her meds as prescribed,following low sodium diet. Please advised.

## 2018-08-16 NOTE — Telephone Encounter (Signed)
BP at goal when I saw her 2-3 weeks ago. Unclear why her BP is this high if she is taking her medicines as prescribed. Unable to make recommendations without seeing her. Can we schedule her in [facility] early next week? She will need to bring all her medicines.

## 2018-08-16 NOTE — Telephone Encounter (Signed)
Thank you :)

## 2018-08-16 NOTE — Telephone Encounter (Signed)
Talked to pt -explained her doctor would like for her to come in early next week to re-eval her BP; call transferred to front office. Appt scheduled Mon 2/24 @ 1045 AM in [facility].

## 2018-08-20 ENCOUNTER — Ambulatory Visit: Payer: Medicare Other

## 2018-08-20 DIAGNOSIS — N179 Acute kidney failure, unspecified: Secondary | ICD-10-CM | POA: Diagnosis not present

## 2018-08-20 DIAGNOSIS — I5033 Acute on chronic diastolic (congestive) heart failure: Secondary | ICD-10-CM | POA: Diagnosis not present

## 2018-08-20 DIAGNOSIS — N390 Urinary tract infection, site not specified: Secondary | ICD-10-CM | POA: Diagnosis not present

## 2018-08-20 DIAGNOSIS — Z7901 Long term (current) use of anticoagulants: Secondary | ICD-10-CM | POA: Diagnosis not present

## 2018-08-20 DIAGNOSIS — Z79891 Long term (current) use of opiate analgesic: Secondary | ICD-10-CM | POA: Diagnosis not present

## 2018-08-21 ENCOUNTER — Ambulatory Visit (INDEPENDENT_AMBULATORY_CARE_PROVIDER_SITE_OTHER): Payer: Medicare Other | Admitting: Internal Medicine

## 2018-08-21 ENCOUNTER — Encounter: Payer: Self-pay | Admitting: Internal Medicine

## 2018-08-21 VITALS — BP 165/90 | HR 83 | Temp 98.5°F | Wt 325.9 lb

## 2018-08-21 DIAGNOSIS — Z79899 Other long term (current) drug therapy: Secondary | ICD-10-CM | POA: Diagnosis not present

## 2018-08-21 DIAGNOSIS — I10 Essential (primary) hypertension: Secondary | ICD-10-CM

## 2018-08-21 MED ORDER — LOSARTAN POTASSIUM 50 MG PO TABS: 50.0000 mg | ORAL_TABLET | Freq: Every day | ORAL | 1 refills | Status: DC

## 2018-08-21 NOTE — Patient Instructions (Addendum)
Kerry Liu,   Your blood pressure is high today..  We will increase the dose of your losartan to treat this.  Please take 2 tablets of the current losartan bottle that you have at home right now.  When you get your new prescription you will only have to take 1 tablet (50 mg) every day.  I will see you back next week in my regular clinic.  If you run out of any medications or having any issues affording them please give [place] a call and let us know so that we can help you.  -Dr. Evelene Croon

## 2018-08-22 ENCOUNTER — Encounter: Payer: Self-pay | Admitting: Internal Medicine

## 2018-08-22 NOTE — Progress Notes (Signed)
   CC: Hypertension follow-up  HPI:  [name] is a 68 y.o. year-old female with PMH listed below who presents to clinic for hypertension follow-up. Please see problem based assessment and plan for further details.   Past Medical History:  Diagnosis Date  . Acute meniscal tear of knee    RIGHT  . Arthritis    "right knee" (11/21/2012)  . Elevated hemoglobin A1c   . Exertional shortness of breath    "this week" (11/21/2012)  . History of DVT (deep vein thrombosis)    IN PREGNANCY  . HTN (hypertension)    CARDIOLOGIST--  DR HOCHREIN-- CLEARANCE NOTE IN EPIC AND CHART FROM 08-21-2012  . Mixed hyperlipidemia   . Obesity   . Pulmonary embolism (HCC)    "3 today" (11/21/2012)  . RBBB (right bundle branch block with left anterior fascicular block)   . Seasonal allergies   . Sinus tachycardia   . Sleep apnea   . Vitamin D deficiency    Review of Systems:   Review of Systems  Constitutional: Negative for malaise/fatigue.  Respiratory: Negative for cough and shortness of breath.   Cardiovascular: Negative for chest pain, palpitations, orthopnea, leg swelling and PND.  Neurological: Negative for dizziness and headaches.    Physical Exam: Vitals:   08/21/18 1602  BP: (!) 165/90  Pulse: 83  Temp: 98.5 F (36.9 C)  TempSrc: Oral  SpO2: 97%  Weight: (!) 325 lb 14.4 oz (147.8 kg)    General: Well-appearing female in no acute distress Cardiac: regular rate and rhythm, nl S1/S2, no murmurs, rubs or gallops, no JVD  Pulm: CTAB, no wheezes or crackles, no increased work of breathing on room air  Ext: warm and well perfused, trace peripheral edema bilaterally  Assessment & Plan:   See Encounters Tab for problem based charting.  Patient discussed with Dr. Oswaldo Done

## 2018-08-22 NOTE — Assessment & Plan Note (Signed)
Patient had changes to her antihypertensive regimen during most recent admission for acute on failure exacerbation due to normal blood pressures. She presented for hospital follow-up 3 weeks ago and her blood pressure was at goal, therefore her regimen at that time was continued which included amlodipine 10 mg and losartan 25 mg daily.  She reported not taking the metoprolol at the time.  We received a call from her home health nurse stating that patient blood pressure was 160-180s and we advised her to be make an appointment in clinic for further evaluation and to bring her medicines.  Reviewed her medications today shows that she is taking amlodipine 10, losartan 25, and she is in fact taking metoprolol 50 mg BID. BP today is 165/90. She is asymptomatic and appears euvolemic on exam. I suspect this is due to recent increase in salt intake as she reports eating frozen and canned foods.  I increase her losartan to 50 mg daily and she will follow-up in 4 weeks.  Counseled on low-salt intake.

## 2018-08-23 DIAGNOSIS — N390 Urinary tract infection, site not specified: Secondary | ICD-10-CM | POA: Diagnosis not present

## 2018-08-23 DIAGNOSIS — N179 Acute kidney failure, unspecified: Secondary | ICD-10-CM | POA: Diagnosis not present

## 2018-08-23 DIAGNOSIS — I5033 Acute on chronic diastolic (congestive) heart failure: Secondary | ICD-10-CM | POA: Diagnosis not present

## 2018-08-23 DIAGNOSIS — Z79891 Long term (current) use of opiate analgesic: Secondary | ICD-10-CM | POA: Diagnosis not present

## 2018-08-23 DIAGNOSIS — Z7901 Long term (current) use of anticoagulants: Secondary | ICD-10-CM | POA: Diagnosis not present

## 2018-08-23 NOTE — Progress Notes (Signed)
Internal Medicine Clinic Attending  Case discussed with Dr. Santos-Sanchez at the time of the visit.  We reviewed the resident's history and exam and pertinent patient test results.  I agree with the assessment, diagnosis, and plan of care documented in the resident's note.    

## 2018-08-26 DIAGNOSIS — Z7901 Long term (current) use of anticoagulants: Secondary | ICD-10-CM | POA: Diagnosis not present

## 2018-08-26 DIAGNOSIS — N179 Acute kidney failure, unspecified: Secondary | ICD-10-CM | POA: Diagnosis not present

## 2018-08-26 DIAGNOSIS — N390 Urinary tract infection, site not specified: Secondary | ICD-10-CM | POA: Diagnosis not present

## 2018-08-26 DIAGNOSIS — I5033 Acute on chronic diastolic (congestive) heart failure: Secondary | ICD-10-CM | POA: Diagnosis not present

## 2018-08-26 DIAGNOSIS — Z79891 Long term (current) use of opiate analgesic: Secondary | ICD-10-CM | POA: Diagnosis not present

## 2018-08-28 ENCOUNTER — Telehealth: Payer: Self-pay

## 2018-08-28 DIAGNOSIS — Z7901 Long term (current) use of anticoagulants: Secondary | ICD-10-CM | POA: Diagnosis not present

## 2018-08-28 DIAGNOSIS — N179 Acute kidney failure, unspecified: Secondary | ICD-10-CM | POA: Diagnosis not present

## 2018-08-28 DIAGNOSIS — N390 Urinary tract infection, site not specified: Secondary | ICD-10-CM | POA: Diagnosis not present

## 2018-08-28 DIAGNOSIS — Z79891 Long term (current) use of opiate analgesic: Secondary | ICD-10-CM | POA: Diagnosis not present

## 2018-08-28 DIAGNOSIS — I5033 Acute on chronic diastolic (congestive) heart failure: Secondary | ICD-10-CM | POA: Diagnosis not present

## 2018-08-28 NOTE — Telephone Encounter (Signed)
Rtc, no answer, no vmail 

## 2018-08-28 NOTE — Telephone Encounter (Signed)
Kerry Liu with [facility] requesting VO for PT. Please call back.  

## 2018-08-30 NOTE — Telephone Encounter (Signed)
VO given for [facility] PT to continue visits 1x week for 4 weeks for endurance, strengthening, safety. Do you agree?

## 2018-08-30 NOTE — Telephone Encounter (Signed)
Tresa Endo [phone] there is vm, [facility] is calling back, pls return to call. This is a straight line number

## 2018-08-30 NOTE — Telephone Encounter (Signed)
Yes, I agree. Thank you!

## 2018-08-31 ENCOUNTER — Encounter: Payer: Medicare Other | Admitting: Internal Medicine

## 2018-08-31 ENCOUNTER — Encounter: Payer: Medicare Other | Admitting: Dietician

## 2018-09-03 ENCOUNTER — Ambulatory Visit: Payer: Medicare Other | Admitting: Pharmacist

## 2018-09-04 ENCOUNTER — Telehealth: Payer: Self-pay | Admitting: *Deleted

## 2018-09-04 DIAGNOSIS — I5033 Acute on chronic diastolic (congestive) heart failure: Secondary | ICD-10-CM | POA: Diagnosis not present

## 2018-09-04 DIAGNOSIS — N390 Urinary tract infection, site not specified: Secondary | ICD-10-CM | POA: Diagnosis not present

## 2018-09-04 DIAGNOSIS — N179 Acute kidney failure, unspecified: Secondary | ICD-10-CM | POA: Diagnosis not present

## 2018-09-04 DIAGNOSIS — Z79891 Long term (current) use of opiate analgesic: Secondary | ICD-10-CM | POA: Diagnosis not present

## 2018-09-04 DIAGNOSIS — Z7901 Long term (current) use of anticoagulants: Secondary | ICD-10-CM | POA: Diagnosis not present

## 2018-09-04 NOTE — Telephone Encounter (Signed)
Agree  Thank you

## 2018-09-04 NOTE — Telephone Encounter (Signed)
Call from [place], [facility] - requesting verbal orders for "OT and [facility] Aide once a week x 2 weeks". Let me know if u approve of these orders? Thanks

## 2018-09-04 NOTE — Telephone Encounter (Signed)
Called Waymon Budge, [facility] - Verbal order given  for OT/HHN per Dr Lovenia Kim.

## 2018-09-06 DIAGNOSIS — N390 Urinary tract infection, site not specified: Secondary | ICD-10-CM | POA: Diagnosis not present

## 2018-09-06 DIAGNOSIS — N179 Acute kidney failure, unspecified: Secondary | ICD-10-CM | POA: Diagnosis not present

## 2018-09-06 DIAGNOSIS — I5033 Acute on chronic diastolic (congestive) heart failure: Secondary | ICD-10-CM | POA: Diagnosis not present

## 2018-09-06 DIAGNOSIS — Z7901 Long term (current) use of anticoagulants: Secondary | ICD-10-CM | POA: Diagnosis not present

## 2018-09-06 DIAGNOSIS — Z79891 Long term (current) use of opiate analgesic: Secondary | ICD-10-CM | POA: Diagnosis not present

## 2018-09-11 DIAGNOSIS — Z79891 Long term (current) use of opiate analgesic: Secondary | ICD-10-CM | POA: Diagnosis not present

## 2018-09-11 DIAGNOSIS — I5033 Acute on chronic diastolic (congestive) heart failure: Secondary | ICD-10-CM | POA: Diagnosis not present

## 2018-09-11 DIAGNOSIS — N390 Urinary tract infection, site not specified: Secondary | ICD-10-CM | POA: Diagnosis not present

## 2018-09-11 DIAGNOSIS — N179 Acute kidney failure, unspecified: Secondary | ICD-10-CM | POA: Diagnosis not present

## 2018-09-11 DIAGNOSIS — Z7901 Long term (current) use of anticoagulants: Secondary | ICD-10-CM | POA: Diagnosis not present

## 2018-09-14 DIAGNOSIS — N390 Urinary tract infection, site not specified: Secondary | ICD-10-CM | POA: Diagnosis not present

## 2018-09-14 DIAGNOSIS — N179 Acute kidney failure, unspecified: Secondary | ICD-10-CM | POA: Diagnosis not present

## 2018-09-14 DIAGNOSIS — Z79891 Long term (current) use of opiate analgesic: Secondary | ICD-10-CM | POA: Diagnosis not present

## 2018-09-14 DIAGNOSIS — I5033 Acute on chronic diastolic (congestive) heart failure: Secondary | ICD-10-CM | POA: Diagnosis not present

## 2018-09-14 DIAGNOSIS — Z7901 Long term (current) use of anticoagulants: Secondary | ICD-10-CM | POA: Diagnosis not present

## 2018-09-17 DIAGNOSIS — I5033 Acute on chronic diastolic (congestive) heart failure: Secondary | ICD-10-CM | POA: Diagnosis not present

## 2018-09-17 DIAGNOSIS — Z7901 Long term (current) use of anticoagulants: Secondary | ICD-10-CM | POA: Diagnosis not present

## 2018-09-17 DIAGNOSIS — N179 Acute kidney failure, unspecified: Secondary | ICD-10-CM | POA: Diagnosis not present

## 2018-09-17 DIAGNOSIS — Z79891 Long term (current) use of opiate analgesic: Secondary | ICD-10-CM | POA: Diagnosis not present

## 2018-09-17 DIAGNOSIS — N390 Urinary tract infection, site not specified: Secondary | ICD-10-CM | POA: Diagnosis not present

## 2018-09-18 DIAGNOSIS — Z79891 Long term (current) use of opiate analgesic: Secondary | ICD-10-CM | POA: Diagnosis not present

## 2018-09-18 DIAGNOSIS — N179 Acute kidney failure, unspecified: Secondary | ICD-10-CM | POA: Diagnosis not present

## 2018-09-18 DIAGNOSIS — Z7901 Long term (current) use of anticoagulants: Secondary | ICD-10-CM | POA: Diagnosis not present

## 2018-09-18 DIAGNOSIS — N390 Urinary tract infection, site not specified: Secondary | ICD-10-CM | POA: Diagnosis not present

## 2018-09-18 DIAGNOSIS — I5033 Acute on chronic diastolic (congestive) heart failure: Secondary | ICD-10-CM | POA: Diagnosis not present

## 2018-09-24 ENCOUNTER — Telehealth: Payer: Self-pay | Admitting: Internal Medicine

## 2018-09-24 DIAGNOSIS — G4733 Obstructive sleep apnea (adult) (pediatric): Secondary | ICD-10-CM

## 2018-09-24 DIAGNOSIS — Z9989 Dependence on other enabling machines and devices: Principal | ICD-10-CM

## 2018-09-24 NOTE — Telephone Encounter (Signed)
Pt requesting a call back about her CPAP supplies.

## 2018-09-28 ENCOUNTER — Encounter: Payer: Medicare Other | Admitting: Internal Medicine

## 2018-10-01 NOTE — Telephone Encounter (Signed)
Pt is returning phone call about her CPAP Supplies.

## 2018-10-01 NOTE — Telephone Encounter (Signed)
Working with patient and [facility] to get cpap supplies.Kerry Liu, Kerry Mccaster Cassady4/6/20204:00 PM

## 2018-10-19 ENCOUNTER — Ambulatory Visit (INDEPENDENT_AMBULATORY_CARE_PROVIDER_SITE_OTHER): Payer: Medicare Other | Admitting: Internal Medicine

## 2018-10-19 ENCOUNTER — Other Ambulatory Visit: Payer: Self-pay

## 2018-10-19 DIAGNOSIS — I10 Essential (primary) hypertension: Secondary | ICD-10-CM

## 2018-10-19 DIAGNOSIS — G4733 Obstructive sleep apnea (adult) (pediatric): Secondary | ICD-10-CM

## 2018-10-19 DIAGNOSIS — I5022 Chronic systolic (congestive) heart failure: Secondary | ICD-10-CM | POA: Diagnosis not present

## 2018-10-19 DIAGNOSIS — I5032 Chronic diastolic (congestive) heart failure: Secondary | ICD-10-CM

## 2018-10-19 DIAGNOSIS — Z79899 Other long term (current) drug therapy: Secondary | ICD-10-CM | POA: Diagnosis not present

## 2018-10-19 DIAGNOSIS — Z9989 Dependence on other enabling machines and devices: Secondary | ICD-10-CM | POA: Diagnosis not present

## 2018-10-19 DIAGNOSIS — I110 Hypertensive heart disease with heart failure: Secondary | ICD-10-CM | POA: Diagnosis not present

## 2018-10-19 DIAGNOSIS — Z86718 Personal history of other venous thrombosis and embolism: Secondary | ICD-10-CM | POA: Diagnosis not present

## 2018-10-19 DIAGNOSIS — Z7901 Long term (current) use of anticoagulants: Secondary | ICD-10-CM

## 2018-10-19 DIAGNOSIS — Z86711 Personal history of pulmonary embolism: Secondary | ICD-10-CM

## 2018-10-19 NOTE — Telephone Encounter (Signed)
Call from pt-she never received c-pap supplies.  After speaking with [facility], they faxed form for supplies to another office-order changed to reflect pcp's name.  Form completed by pcp and faxed by CMA to [facility]-pt aware.Kerry Spittle Cassady4/24/20203:05 PM

## 2018-10-21 ENCOUNTER — Encounter: Payer: Self-pay | Admitting: Internal Medicine

## 2018-10-21 NOTE — Progress Notes (Signed)
Medicine attending: Medical history, presenting problems,  and medications, reviewed with resident physician Dr Santos-Sanchez on the day of the patient telephone consultation and I concur with her evaluation and management plan. 

## 2018-10-21 NOTE — Assessment & Plan Note (Signed)
Patient has not been compliant with CPAP for the past 3 months. She is missing part of the machine equipment. I placed a DME order for missing parts in 07/2018 but patient never received them. Will reach out to [employer] to request assistance.

## 2018-10-21 NOTE — Assessment & Plan Note (Signed)
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 Currently receiving samplse from [place] and cardiology office. I messaged Dr. Selena Batten to see if she qualifies for Xarelto assistance program. Will follow.

## 2018-10-21 NOTE — Assessment & Plan Note (Signed)
Follows up with cardiology. Patient remains asymptomatic. Weight has been stable at home ~ 298 lbs. She is aware she needs to call us if her weight increases 3 lbs in 1 day or 5 lbs in 1 week. Continue metoprolol 50 mg BID, losartan 50 mg QD, high intensity atorvastatin, and Lasix 20 mg daily.

## 2018-10-21 NOTE — Assessment & Plan Note (Signed)
Well controlled on metoprolol, losartan, and amlodipine. Will continue at current doses.

## 2018-10-21 NOTE — Progress Notes (Signed)
  [facility] Residency Telephone Encounter Continuity Care Appointment  HPI:   This telephone encounter was created for [name] on 10/21/2018 for the following purpose/cc follow up of HfrEF, HTN, and OSA.   Past Medical History:  Past Medical History:  Diagnosis Date  . Acute meniscal tear of knee    RIGHT  . Arthritis    "right knee" (11/21/2012)  . Elevated hemoglobin A1c   . Exertional shortness of breath    "this week" (11/21/2012)  . History of DVT (deep vein thrombosis)    IN PREGNANCY  . HTN (hypertension)    CARDIOLOGIST--  DR HOCHREIN-- CLEARANCE NOTE IN EPIC AND CHART FROM 08-21-2012  . Mixed hyperlipidemia   . Obesity   . Pulmonary embolism (HCC)    "3 today" (11/21/2012)  . RBBB (right bundle branch block with left anterior fascicular block)   . Seasonal allergies   . Sinus tachycardia   . Sleep apnea   . Vitamin D deficiency       ROS:   Patient endorses chronic orthopnea. She denies fever, chills, HA, changes in vision, chest pain, SOB, abdominal pain, N/V, changes in bowel movements, and LE swelling.    Assessment / Plan / Recommendations:   Please see A&P under problem oriented charting for assessment of the patient's acute and chronic medical conditions.   As always, pt is advised that if symptoms worsen or new symptoms arise, they should go to an urgent care facility or to to ER for further evaluation.   Consent and Medical Decision Making:   Patient discussed with Dr. Cyndie Chime  This is a telephone encounter between Kerry Liu and Kerry Liu on 10/21/2018 for chronic medical conditions stated above. The visit was conducted with the patient located at home and Burna Cash at [facility]. The patient's identity was confirmed using their DOB and current address. The patient has consented to being evaluated through a telephone encounter and understands the associated risks (an examination cannot be done and the  patient may need to come in for an appointment) / benefits (allows the patient to remain at home, decreasing exposure to coronavirus). I personally spent 10 minutes on medical discussion.

## 2018-10-23 ENCOUNTER — Encounter: Payer: Self-pay | Admitting: Pharmacist

## 2018-10-23 NOTE — Progress Notes (Addendum)
Medication Samples have been provided to the patient.  Drug name: amlodipine       Strength: 10 mg   Qty: 30 LOT: [id number] Exp.Date: April 2021 Dosing instructions: 1 tablet daily  Drug name: losartan     Strength: 25 mg        Qty: 60 LOT: [id number]  Exp.Date: May 2021 Dosing instructions: 2 tablet daily  Drug name: rivaroxaban     Strength: 20 mg      Qty: 28 LOT: [id number] Exp.Date: Oct 2021 Dosing instructions: 1 tablet daily  Drug name: albuterol     Strength: 90 mcg/puff      Qty: 1 LOT: [id number] Exp.Date: Mar 2021 Dosing instructions: 1-2 puffs as needed for shortness of breath every 6 hours  The patient has been instructed regarding the correct time, dose, and frequency of taking this medication, including desired effects and most common side effects. Patient was advised to follow up with financial paperwork in order to enroll in free pharmacy program. She is also enrolling in Medicare which would be scheduled to start in July 2020 if patient completes application process.  Marzetta Board 9:55 AM 10/23/2018

## 2018-10-29 ENCOUNTER — Telehealth: Payer: Self-pay | Admitting: *Deleted

## 2018-10-29 NOTE — Telephone Encounter (Signed)
Call from pt stating she has not received her c-pap supplies from [employer]. Call made to [employer]-they have received order for supplies, but they have not shipped out.  States supplies will likely be shipped out today or tomorrow.  Call made to inform patient-she was also given contact number to [employer] to check status of supplies.Kerry Spittle Cassady5/4/20203:40 PM

## 2019-01-08 ENCOUNTER — Other Ambulatory Visit: Payer: Self-pay

## 2019-01-08 DIAGNOSIS — I5032 Chronic diastolic (congestive) heart failure: Secondary | ICD-10-CM

## 2019-01-08 DIAGNOSIS — E782 Mixed hyperlipidemia: Secondary | ICD-10-CM

## 2019-01-08 DIAGNOSIS — I10 Essential (primary) hypertension: Secondary | ICD-10-CM

## 2019-01-08 NOTE — Telephone Encounter (Signed)
Requesting refill on all meds except furosemide (LASIX) 20 MG tablet. Pt is using  [place], [place] [phone] (Phone) [phone] (Fax)

## 2019-01-10 MED ORDER — METOPROLOL TARTRATE 50 MG PO TABS: 50.0000 mg | ORAL_TABLET | Freq: Two times a day (BID) | ORAL | 3 refills | Status: DC

## 2019-01-10 MED ORDER — AMLODIPINE BESYLATE 10 MG PO TABS: 10.0000 mg | ORAL_TABLET | Freq: Every day | ORAL | 1 refills | Status: DC

## 2019-01-10 MED ORDER — CHOLECALCIFEROL 25 MCG (1000 UT) PO CAPS: 2000.0000 [IU] | ORAL_CAPSULE | Freq: Every day | ORAL | 2 refills | Status: AC

## 2019-01-10 MED ORDER — LOSARTAN POTASSIUM 50 MG PO TABS: 50.0000 mg | ORAL_TABLET | Freq: Every day | ORAL | 1 refills | Status: DC

## 2019-01-10 MED ORDER — FUROSEMIDE 20 MG PO TABS: 20.0000 mg | ORAL_TABLET | Freq: Every day | ORAL | 1 refills | Status: DC

## 2019-01-10 MED ORDER — ATORVASTATIN CALCIUM 80 MG PO TABS: 40.0000 mg | ORAL_TABLET | Freq: Every evening | ORAL | 0 refills | Status: DC

## 2019-01-11 ENCOUNTER — Other Ambulatory Visit: Payer: Self-pay | Admitting: Internal Medicine

## 2019-01-11 MED ORDER — RIVAROXABAN 20 MG PO TABS: 20.0000 mg | ORAL_TABLET | Freq: Every day | ORAL | 6 refills | Status: DC

## 2019-01-11 NOTE — Telephone Encounter (Signed)
Needs refill on rivaroxaban (XARELTO) 20 MG TABS tablet  ;pt contact [place], [place]

## 2019-03-28 ENCOUNTER — Other Ambulatory Visit: Payer: Self-pay | Admitting: *Deleted

## 2019-03-28 DIAGNOSIS — I10 Essential (primary) hypertension: Secondary | ICD-10-CM

## 2019-03-28 DIAGNOSIS — E782 Mixed hyperlipidemia: Secondary | ICD-10-CM

## 2019-03-28 DIAGNOSIS — I5032 Chronic diastolic (congestive) heart failure: Secondary | ICD-10-CM

## 2019-03-28 MED ORDER — LOSARTAN POTASSIUM 50 MG PO TABS: 50.0000 mg | ORAL_TABLET | Freq: Every day | ORAL | 0 refills | Status: AC

## 2019-03-28 MED ORDER — ATORVASTATIN CALCIUM 80 MG PO TABS: 40.0000 mg | ORAL_TABLET | Freq: Every evening | ORAL | 0 refills | Status: AC

## 2019-03-28 MED ORDER — FUROSEMIDE 20 MG PO TABS: 20.0000 mg | ORAL_TABLET | Freq: Every day | ORAL | 0 refills | Status: DC

## 2019-03-28 MED ORDER — RIVAROXABAN 20 MG PO TABS: 20.0000 mg | ORAL_TABLET | Freq: Every day | ORAL | 6 refills | Status: DC

## 2019-03-28 MED ORDER — AMLODIPINE BESYLATE 10 MG PO TABS: 10.0000 mg | ORAL_TABLET | Freq: Every day | ORAL | 0 refills | Status: DC

## 2019-03-28 MED ORDER — METOPROLOL TARTRATE 50 MG PO TABS: 50.0000 mg | ORAL_TABLET | Freq: Two times a day (BID) | ORAL | 1 refills | Status: DC

## 2019-03-28 NOTE — Telephone Encounter (Signed)
Please schedule pt an appt w/Dr Isac Sarna. Thanks

## 2019-03-28 NOTE — Telephone Encounter (Signed)
Patient needs appt with me. Sent enough refills to cover her for the next 3 months only.

## 2019-04-29 ENCOUNTER — Encounter: Payer: Medicare Other | Admitting: Internal Medicine

## 2019-05-25 ENCOUNTER — Other Ambulatory Visit: Payer: Self-pay | Admitting: Internal Medicine

## 2019-05-25 DIAGNOSIS — I5032 Chronic diastolic (congestive) heart failure: Secondary | ICD-10-CM

## 2019-05-25 DIAGNOSIS — I10 Essential (primary) hypertension: Secondary | ICD-10-CM

## 2019-05-27 NOTE — Telephone Encounter (Signed)
NO LONGER [facility] PATIENT, TRANSFER CARE TO [facility] AS OF 04/26/2019

## 2019-09-30 DIAGNOSIS — M25562 Pain in left knee: Secondary | ICD-10-CM | POA: Diagnosis not present

## 2019-09-30 DIAGNOSIS — L609 Nail disorder, unspecified: Secondary | ICD-10-CM | POA: Diagnosis not present

## 2019-09-30 DIAGNOSIS — W19XXXA Unspecified fall, initial encounter: Secondary | ICD-10-CM | POA: Diagnosis not present

## 2019-09-30 DIAGNOSIS — R5381 Other malaise: Secondary | ICD-10-CM | POA: Diagnosis not present

## 2019-09-30 DIAGNOSIS — I739 Peripheral vascular disease, unspecified: Secondary | ICD-10-CM | POA: Diagnosis not present

## 2019-10-01 ENCOUNTER — Other Ambulatory Visit: Payer: Self-pay | Admitting: Family Medicine

## 2019-10-01 DIAGNOSIS — I739 Peripheral vascular disease, unspecified: Secondary | ICD-10-CM

## 2019-10-03 ENCOUNTER — Ambulatory Visit: Payer: Medicare HMO | Attending: Internal Medicine

## 2019-10-03 DIAGNOSIS — Z23 Encounter for immunization: Secondary | ICD-10-CM

## 2019-10-03 NOTE — Progress Notes (Signed)
   Covid-19 Vaccination Clinic  Name:  Kerry Liu    MRN: [id number] DOB: 1950-11-17  10/03/2019  [name] was observed post Covid-19 immunization for 15 minutes without incident. She was provided with Vaccine Information Sheet and instruction to access the V-Safe system.   [name] was instructed to call 911 with any severe reactions post vaccine: Marland Kitchen Difficulty breathing  . Swelling of face and throat  . A fast heartbeat  . A bad rash all over body  . Dizziness and weakness   Immunizations Administered    Name Date Dose VIS Date Route   Pfizer COVID-19 Vaccine 10/03/2019  4:01 PM 0.3 mL 06/07/2019 Intramuscular   Manufacturer: [employer], [employer]   Lot: [id number]   NDC: [address]-7

## 2019-10-09 ENCOUNTER — Other Ambulatory Visit: Payer: Medicare HMO

## 2019-10-15 ENCOUNTER — Other Ambulatory Visit: Payer: Self-pay | Admitting: Internal Medicine

## 2019-10-15 DIAGNOSIS — E782 Mixed hyperlipidemia: Secondary | ICD-10-CM

## 2019-10-16 ENCOUNTER — Ambulatory Visit
Admission: RE | Admit: 2019-10-16 | Discharge: 2019-10-16 | Disposition: A | Discharge status: Home / Self Care | Payer: Medicare HMO | Source: Ambulatory Visit | Attending: Family Medicine | Admitting: Family Medicine

## 2019-10-16 DIAGNOSIS — Z8679 Personal history of other diseases of the circulatory system: Secondary | ICD-10-CM | POA: Diagnosis not present

## 2019-10-16 DIAGNOSIS — I739 Peripheral vascular disease, unspecified: Secondary | ICD-10-CM

## 2019-10-28 ENCOUNTER — Encounter: Payer: Self-pay | Admitting: Podiatry

## 2019-10-28 ENCOUNTER — Other Ambulatory Visit: Payer: Self-pay

## 2019-10-28 ENCOUNTER — Ambulatory Visit (INDEPENDENT_AMBULATORY_CARE_PROVIDER_SITE_OTHER): Payer: Medicare HMO | Admitting: Podiatry

## 2019-10-28 DIAGNOSIS — M79675 Pain in left toe(s): Secondary | ICD-10-CM

## 2019-10-28 DIAGNOSIS — M79674 Pain in right toe(s): Secondary | ICD-10-CM

## 2019-10-28 DIAGNOSIS — B351 Tinea unguium: Secondary | ICD-10-CM | POA: Diagnosis not present

## 2019-10-28 DIAGNOSIS — Z7901 Long term (current) use of anticoagulants: Secondary | ICD-10-CM

## 2019-10-28 MED ORDER — CICLOPIROX 8 % EX SOLN: Freq: Every day | CUTANEOUS | 4 refills | Status: AC

## 2019-10-30 ENCOUNTER — Ambulatory Visit: Payer: Medicare HMO | Attending: Internal Medicine

## 2019-10-30 DIAGNOSIS — Z23 Encounter for immunization: Secondary | ICD-10-CM

## 2019-10-30 NOTE — Progress Notes (Signed)
Subjective:   Patient ID: Kerry Liu, female   DOB: 69 y.o.   MRN: [id number]   HPI 69 year old female presents the office with concerns of thick, painful, elongated toenails that she cannot trim her self also asking about treatment for nail fungus.  Denies any redness or drainage or any swelling.  She said no recent treatment for nail fungus.  She was previously seen another podiatrist and have the nails trimmed.   Review of Systems  All other systems reviewed and are negative.  Past Medical History:  Diagnosis Date  . Acute meniscal tear of knee    RIGHT  . Arthritis    "right knee" (11/21/2012)  . Elevated hemoglobin A1c   . Exertional shortness of breath    "this week" (11/21/2012)  . History of DVT (deep vein thrombosis)    IN PREGNANCY  . HTN (hypertension)    CARDIOLOGIST--  DR HOCHREIN-- CLEARANCE NOTE IN EPIC AND CHART FROM 08-21-2012  . Mixed hyperlipidemia   . Obesity   . Pulmonary embolism ([place])    "3 today" (11/21/2012)  . RBBB (right bundle branch block with left anterior fascicular block)   . Seasonal allergies   . Sinus tachycardia   . Sleep apnea   . Vitamin D deficiency     Past Surgical History:  Procedure Laterality Date  . IVC filter placed in May 2014 N/A   . KNEE ARTHROSCOPY Right 12-09-2002  . KNEE ARTHROSCOPY WITH MEDIAL MENISECTOMY Right 10/05/2012   Procedure: KNEE ARTHROSCOPY WITH MEDIAL MENISECTOMY;  Surgeon: Tobi Bastos, MD;  Location: [facility];  Service: Orthopedics;  Laterality: Right;  . TOTAL ABDOMINAL HYSTERECTOMY W/ BILATERAL SALPINGOOPHORECTOMY  ~ 1986   W/ BILATERAL SALPINGOOPHORECTOMY  . TRANSTHORACIC ECHOCARDIOGRAM  11-17-2010   MILD LVH/ EF 86-76%/ GRADE I DIASTOLIC DYSFUNCTION/ MILD LAE / MILD RAE     Current Outpatient Medications:  .  albuterol (PROVENTIL) (2.5 MG/3ML) 0.083% nebulizer solution, Take 3 mLs (2.5 mg total) by nebulization every 6 (six) hours as needed for wheezing or shortness of breath.,  Disp: 75 mL, Rfl: 12 .  amLODipine (NORVASC) 10 MG tablet, Take 1 tablet (10 mg total) by mouth daily., Disp: 90 tablet, Rfl: 0 .  atorvastatin (LIPITOR) 80 MG tablet, Take 0.5 tablets (40 mg total) by mouth every evening., Disp: 30 tablet, Rfl: 0 .  Cholecalciferol 25 MCG (1000 UT) capsule, Take 2 capsules (2,000 Units total) by mouth daily., Disp: 60 capsule, Rfl: 2 .  ciclopirox (PENLAC) 8 % solution, Apply topically at bedtime. Apply over nail and surrounding skin. Apply daily over previous coat. After seven (7) days, may remove with alcohol and continue cycle., Disp: 6.6 mL, Rfl: 4 .  furosemide (LASIX) 20 MG tablet, Take 1 tablet (20 mg total) by mouth daily., Disp: 90 tablet, Rfl: 0 .  losartan (COZAAR) 50 MG tablet, Take 1 tablet (50 mg total) by mouth daily., Disp: 90 tablet, Rfl: 0 .  metoprolol tartrate (LOPRESSOR) 50 MG tablet, Take 1 tablet (50 mg total) by mouth 2 (two) times daily., Disp: 90 tablet, Rfl: 1 .  rivaroxaban (XARELTO) 20 MG TABS tablet, Take 1 tablet (20 mg total) by mouth daily with supper., Disp: 30 tablet, Rfl: 6  Allergies  Allergen Reactions  . Codeine Other (See Comments)    Unknown  . Hydrocodone     Itch  . Oxycodone Rash  . Tramadol Rash          Objective:  Physical Exam  General: AAO x3, NAD  Dermatological: Nails are hypertrophic, dystrophic, brittle, discolored, elongated 10. No surrounding redness or drainage. Tenderness nails 1-5 bilaterally. No open lesions or pre-ulcerative lesions are identified today.  Vascular: Dorsalis Pedis artery and Posterior Tibial artery pedal pulses are 2/4 bilateral with immedate capillary fill time. There is no pain with calf compression, swelling, warmth, erythema.   Neruologic: Grossly intact via light touch bilateral.   Musculoskeletal: No gross boney pedal deformities bilateral. No pain, crepitus, or limitation noted with foot and ankle range of motion bilateral. Muscular strength 5/5 in all groups tested  bilateral.      Assessment:   Symptomatic onychomycosis, on Xarelto     Plan:  -Treatment options discussed including all alternatives, risks, and complications -Etiology of symptoms were discussed -Nails debrided 10 without complications or bleeding. -[place] -Daily foot inspection -Follow-up in 3 months or sooner if any problems arise. In the meantime, encouraged to call the office with any questions, concerns, change in symptoms.   Ovid Curd, DPM

## 2019-10-30 NOTE — Progress Notes (Signed)
   Covid-19 Vaccination Clinic  Name:  Kerry Liu    MRN: [id number] DOB: 05-26-1951  10/30/2019  [name] was observed post Covid-19 immunization for 15 minutes without incident. She was provided with Vaccine Information Sheet and instruction to access the V-Safe system.   [name] was instructed to call 911 with any severe reactions post vaccine: Marland Kitchen Difficulty breathing  . Swelling of face and throat  . A fast heartbeat  . A bad rash all over body  . Dizziness and weakness   Immunizations Administered    Name Date Dose VIS Date Route   Pfizer COVID-19 Vaccine 10/30/2019  3:30 PM 0.3 mL 08/21/2018 Intramuscular   Manufacturer: [employer], [employer]   Lot: [id number]   NDC: [address]-7

## 2019-11-08 DIAGNOSIS — G4733 Obstructive sleep apnea (adult) (pediatric): Secondary | ICD-10-CM | POA: Diagnosis not present

## 2019-11-18 ENCOUNTER — Emergency Department (HOSPITAL_COMMUNITY): Payer: Medicare HMO

## 2019-11-18 ENCOUNTER — Encounter (HOSPITAL_COMMUNITY): Payer: Self-pay

## 2019-11-18 ENCOUNTER — Emergency Department (HOSPITAL_COMMUNITY)
Admission: EM | Admit: 2019-11-18 | Discharge: 2019-11-19 | Disposition: A | Discharge status: Home / Self Care | Payer: Medicare HMO | Attending: Emergency Medicine | Admitting: Emergency Medicine

## 2019-11-18 DIAGNOSIS — Y999 Unspecified external cause status: Secondary | ICD-10-CM | POA: Insufficient documentation

## 2019-11-18 DIAGNOSIS — S63501A Unspecified sprain of right wrist, initial encounter: Secondary | ICD-10-CM | POA: Diagnosis not present

## 2019-11-18 DIAGNOSIS — Y929 Unspecified place or not applicable: Secondary | ICD-10-CM | POA: Diagnosis not present

## 2019-11-18 DIAGNOSIS — R102 Pelvic and perineal pain: Secondary | ICD-10-CM | POA: Diagnosis not present

## 2019-11-18 DIAGNOSIS — M79641 Pain in right hand: Secondary | ICD-10-CM | POA: Insufficient documentation

## 2019-11-18 DIAGNOSIS — S79922A Unspecified injury of left thigh, initial encounter: Secondary | ICD-10-CM | POA: Diagnosis not present

## 2019-11-18 DIAGNOSIS — R0789 Other chest pain: Secondary | ICD-10-CM | POA: Insufficient documentation

## 2019-11-18 DIAGNOSIS — M79651 Pain in right thigh: Secondary | ICD-10-CM | POA: Diagnosis not present

## 2019-11-18 DIAGNOSIS — Y939 Activity, unspecified: Secondary | ICD-10-CM | POA: Insufficient documentation

## 2019-11-18 DIAGNOSIS — R0602 Shortness of breath: Secondary | ICD-10-CM | POA: Insufficient documentation

## 2019-11-18 DIAGNOSIS — M542 Cervicalgia: Secondary | ICD-10-CM | POA: Diagnosis not present

## 2019-11-18 DIAGNOSIS — S79921A Unspecified injury of right thigh, initial encounter: Secondary | ICD-10-CM | POA: Diagnosis not present

## 2019-11-18 DIAGNOSIS — M79652 Pain in left thigh: Secondary | ICD-10-CM | POA: Insufficient documentation

## 2019-11-18 DIAGNOSIS — M25531 Pain in right wrist: Secondary | ICD-10-CM | POA: Diagnosis not present

## 2019-11-18 DIAGNOSIS — T07XXXA Unspecified multiple injuries, initial encounter: Secondary | ICD-10-CM | POA: Diagnosis present

## 2019-11-18 DIAGNOSIS — S3993XA Unspecified injury of pelvis, initial encounter: Secondary | ICD-10-CM | POA: Diagnosis not present

## 2019-11-18 DIAGNOSIS — S6991XA Unspecified injury of right wrist, hand and finger(s), initial encounter: Secondary | ICD-10-CM | POA: Diagnosis not present

## 2019-11-18 MED ORDER — ACETAMINOPHEN 500 MG PO TABS
500.0000 mg | ORAL_TABLET | Freq: Once | ORAL | Status: AC
  Administered 2019-11-19: 500 mg via ORAL
  Filled 2019-11-18: qty 1
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
## 2019-11-18 NOTE — Discharge Instructions (Signed)
Your images today were overall reassuring.  I suspect you may have sprained your wrist.  Please follow-up with your primary doctor and if any symptoms change or worsen, please return to the nearest emergency department.  You may use over-the-counter anti-inflammatory medication and pain medicine.

## 2019-11-19 DIAGNOSIS — M542 Cervicalgia: Secondary | ICD-10-CM | POA: Diagnosis not present

## 2019-11-19 NOTE — Progress Notes (Signed)
Orthopedic Tech Progress Note Patient Details:  Kerry Liu 12-Jul-1950 [id number]  Ortho Devices Type of Ortho Device: Velcro wrist splint Ortho Device/Splint Location: Right Wrist Ortho Device/Splint Interventions: Application   Post Interventions Patient Tolerated: Well Instructions Provided: Adjustment of device   [name] [name] 11/19/2019, 12:09 AM

## 2020-01-28 ENCOUNTER — Encounter: Payer: Self-pay | Admitting: Podiatry

## 2020-01-28 ENCOUNTER — Ambulatory Visit (INDEPENDENT_AMBULATORY_CARE_PROVIDER_SITE_OTHER): Payer: Medicare HMO | Admitting: Podiatry

## 2020-01-28 ENCOUNTER — Other Ambulatory Visit: Payer: Self-pay

## 2020-01-28 DIAGNOSIS — M79675 Pain in left toe(s): Secondary | ICD-10-CM

## 2020-01-28 DIAGNOSIS — M79674 Pain in right toe(s): Secondary | ICD-10-CM

## 2020-01-28 DIAGNOSIS — B351 Tinea unguium: Secondary | ICD-10-CM

## 2020-01-29 NOTE — Progress Notes (Signed)
Subjective:  Patient ID: Kerry Liu, female    DOB: Jan 28, 1951,  MRN: [id number]  Kerry Liu presents to clinic today for painful thick toenails that are difficult to trim. Pain interferes with ambulation. Aggravating factors include wearing enclosed shoe gear. Pain is relieved with periodic professional debridement..  69 y.o. female presents with the above complaint.    Review of Systems: Negative except as noted in the HPI. Past Medical History:  Diagnosis Date  . Acute meniscal tear of knee    RIGHT  . Arthritis    "right knee" (11/21/2012)  . Elevated hemoglobin A1c   . Exertional shortness of breath    "this week" (11/21/2012)  . History of DVT (deep vein thrombosis)    IN PREGNANCY  . HTN (hypertension)    CARDIOLOGIST--  DR HOCHREIN-- CLEARANCE NOTE IN EPIC AND CHART FROM 08-21-2012  . Mixed hyperlipidemia   . Obesity   . Pulmonary embolism ([facility])    "3 today" (11/21/2012)  . RBBB (right bundle branch block with left anterior fascicular block)   . Seasonal allergies   . Sinus tachycardia   . Sleep apnea   . Vitamin D deficiency    Past Surgical History:  Procedure Laterality Date  . IVC filter placed in May 2014 N/A   . KNEE ARTHROSCOPY Right 12-09-2002  . KNEE ARTHROSCOPY WITH MEDIAL MENISECTOMY Right 10/05/2012   Procedure: KNEE ARTHROSCOPY WITH MEDIAL MENISECTOMY;  Surgeon: Jacki Cones, MD;  Location: [facility];  Service: Orthopedics;  Laterality: Right;  . TOTAL ABDOMINAL HYSTERECTOMY W/ BILATERAL SALPINGOOPHORECTOMY  ~ 1986   W/ BILATERAL SALPINGOOPHORECTOMY  . TRANSTHORACIC ECHOCARDIOGRAM  11-17-2010   MILD LVH/ EF 60-65%/ GRADE I DIASTOLIC DYSFUNCTION/ MILD LAE / MILD RAE    Current Outpatient Medications:  .  albuterol (PROVENTIL) (2.5 MG/3ML) 0.083% nebulizer solution, Take 3 mLs (2.5 mg total) by nebulization every 6 (six) hours as needed for wheezing or shortness of breath., Disp: 75 mL, Rfl: 12 .  amLODipine (NORVASC) 10 MG  tablet, Take 1 tablet (10 mg total) by mouth daily., Disp: 90 tablet, Rfl: 0 .  atorvastatin (LIPITOR) 80 MG tablet, Take 0.5 tablets (40 mg total) by mouth every evening. (Patient not taking: Reported on 11/18/2019), Disp: 30 tablet, Rfl: 0 .  Cholecalciferol 25 MCG (1000 UT) capsule, Take 2 capsules (2,000 Units total) by mouth daily., Disp: 60 capsule, Rfl: 2 .  ciclopirox (PENLAC) 8 % solution, Apply topically at bedtime. Apply over nail and surrounding skin. Apply daily over previous coat. After seven (7) days, may remove with alcohol and continue cycle., Disp: 6.6 mL, Rfl: 4 .  furosemide (LASIX) 20 MG tablet, Take 1 tablet (20 mg total) by mouth daily., Disp: 90 tablet, Rfl: 0 .  losartan (COZAAR) 50 MG tablet, Take 1 tablet (50 mg total) by mouth daily., Disp: 90 tablet, Rfl: 0 .  metoprolol tartrate (LOPRESSOR) 50 MG tablet, Take 1 tablet (50 mg total) by mouth 2 (two) times daily., Disp: 90 tablet, Rfl: 1 .  rivaroxaban (XARELTO) 20 MG TABS tablet, Take 1 tablet (20 mg total) by mouth daily with supper., Disp: 30 tablet, Rfl: 6 Allergies  Allergen Reactions  . Codeine Itching  . Hydrocodone Itching  . Oxycodone Rash  . Tramadol Rash   Social History   Occupational History  . Occupation: housekeeper    Employer: [employer]  Tobacco Use  . Smoking status: Never Smoker  . Smokeless tobacco: Never Used  Substance and Sexual Activity  .  Alcohol use: No  . Drug use: No  . Sexual activity: Never    Objective:   Constitutional Kerry Liu is a pleasant 69 y.o. African American female, morbidly obese in NAD.Marland Kitchen AAO x 3.   Vascular Capillary refill time to digits immediate b/l. Palpable pedal pulses b/l LE. Pedal hair present. Lower extremity skin temperature gradient within normal limits.  No cyanosis or clubbing noted.  Neurologic Normal speech. Oriented to person, place, and time. Epicritic sensation to light touch grossly present bilaterally. Protective sensation intact  5/5 intact bilaterally with 10g monofilament b/l. Vibratory sensation intact b/l. Proprioception intact bilaterally.  Dermatologic Pedal skin with normal turgor, texture and tone bilaterally. No open wounds bilaterally. No interdigital macerations bilaterally. Toenails 1-5 b/l elongated, discolored, dystrophic, thickened, crumbly with subungual debris and tenderness to dorsal palpation. No hyperkeratotic nor porokeratotic lesions present on today's visit.  Orthopedic: Normal muscle strength 5/5 to all lower extremity muscle groups bilaterally. No pain crepitus or joint limitation noted with ROM b/l. No gross bony deformities bilaterally.   Radiographs: None Assessment:   1. Pain due to onychomycosis of toenails of both feet    Plan:  Patient was evaluated and treated and all questions answered.  Onychomycosis with pain -Nails palliatively debridement as below -Educated on self-care  Procedure: Nail Debridement Rationale: Pain Type of Debridement: manual, sharp debridement. Instrumentation: Nail nipper, rotary burr. Number of Nails: 10 -Examined patient. -No new findings. No new orders. -Toenails 1-5 b/l were debrided in length and girth with sterile nail nippers and dremel without iatrogenic bleeding.  -Patient to report any pedal injuries to medical professional immediately. -Patient to continue soft, supportive shoe gear daily. -Patient/POA to call should there be question/concern in the interim.  No follow-ups on file.  Freddie Breech, DPM

## 2020-05-01 ENCOUNTER — Ambulatory Visit: Payer: Medicare HMO | Admitting: Podiatry

## 2021-04-09 ENCOUNTER — Other Ambulatory Visit: Payer: Self-pay | Admitting: Internal Medicine

## 2021-04-09 DIAGNOSIS — R109 Unspecified abdominal pain: Secondary | ICD-10-CM

## 2021-04-09 DIAGNOSIS — M545 Low back pain, unspecified: Secondary | ICD-10-CM

## 2021-04-09 DIAGNOSIS — R10A0 Flank pain, unspecified side: Secondary | ICD-10-CM

## 2021-04-27 ENCOUNTER — Other Ambulatory Visit: Payer: Self-pay

## 2021-04-27 ENCOUNTER — Ambulatory Visit
Admission: RE | Admit: 2021-04-27 | Discharge: 2021-04-27 | Disposition: A | Discharge status: Home / Self Care | Payer: Medicare Other | Source: Ambulatory Visit | Attending: Internal Medicine | Admitting: Internal Medicine

## 2021-04-27 DIAGNOSIS — R109 Unspecified abdominal pain: Secondary | ICD-10-CM

## 2021-04-27 DIAGNOSIS — M545 Low back pain, unspecified: Secondary | ICD-10-CM

## 2021-04-27 DIAGNOSIS — R10A0 Flank pain, unspecified side: Secondary | ICD-10-CM

## 2021-05-10 ENCOUNTER — Other Ambulatory Visit: Payer: Self-pay

## 2021-05-10 ENCOUNTER — Encounter: Payer: Self-pay | Admitting: Cardiology

## 2021-05-10 ENCOUNTER — Ambulatory Visit (INDEPENDENT_AMBULATORY_CARE_PROVIDER_SITE_OTHER): Payer: Medicare Other | Admitting: Cardiology

## 2021-05-10 VITALS — BP 120/90 | HR 73 | Ht 63.0 in | Wt 314.6 lb

## 2021-05-10 DIAGNOSIS — E782 Mixed hyperlipidemia: Secondary | ICD-10-CM | POA: Diagnosis not present

## 2021-05-10 DIAGNOSIS — G4733 Obstructive sleep apnea (adult) (pediatric): Secondary | ICD-10-CM

## 2021-05-10 DIAGNOSIS — E559 Vitamin D deficiency, unspecified: Secondary | ICD-10-CM

## 2021-05-10 DIAGNOSIS — R0602 Shortness of breath: Secondary | ICD-10-CM

## 2021-05-10 DIAGNOSIS — Z79899 Other long term (current) drug therapy: Secondary | ICD-10-CM | POA: Diagnosis not present

## 2021-05-10 MED ORDER — POTASSIUM CHLORIDE CRYS ER 20 MEQ PO TBCR: 20.0000 meq | EXTENDED_RELEASE_TABLET | Freq: Every day | ORAL | 3 refills | Status: AC

## 2021-05-10 MED ORDER — FUROSEMIDE 40 MG PO TABS: 40.0000 mg | ORAL_TABLET | Freq: Every day | ORAL | 3 refills | Status: AC

## 2021-05-10 NOTE — Progress Notes (Signed)
Cardiology Office Note:    Date:  05/11/2021   ID:  Kerry Liu, DOB [birth year], MRN [id number]  PCP:  Margot Ables, MD  Cardiologist:  Thomasene Ripple, DO  Electrophysiologist:  None   Referring MD: Lula Olszewski, MD   " I am experiencing shortness of breath"   History of Present Illness:    Kerry Liu is a 70 y.o. female with a hx of Hypertension, hyperlipidemia, morbid obesity, pulmonary embolism and DVT and is being treated by chronic anticoagulation with Xarelto, sleep apnea and vitamin D deficiency.   Referred back to heart care for worsening shortness of breath and exertion.   Past Medical History:  Diagnosis Date   Acute meniscal tear of knee    RIGHT   Arthritis    "right knee" (11/21/2012)   Elevated hemoglobin A1c    Exertional shortness of breath    "this week" (11/21/2012)   History of DVT (deep vein thrombosis)    IN PREGNANCY   HTN (hypertension)    CARDIOLOGIST--  DR HOCHREIN-- CLEARANCE NOTE IN EPIC AND CHART FROM 08-21-2012   Mixed hyperlipidemia    Obesity    Pulmonary embolism (HCC)    "3 today" (11/21/2012)   RBBB (right bundle branch block with left anterior fascicular block)    Seasonal allergies    Sinus tachycardia    Sleep apnea    Vitamin D deficiency     Past Surgical History:  Procedure Laterality Date   IVC filter placed in May 2014 N/A    KNEE ARTHROSCOPY Right 12-09-2002   KNEE ARTHROSCOPY WITH MEDIAL MENISECTOMY Right 10/05/2012   Procedure: KNEE ARTHROSCOPY WITH MEDIAL MENISECTOMY;  Surgeon: Jacki Cones, MD;  Location: [facility];  Service: Orthopedics;  Laterality: Right;   TOTAL ABDOMINAL HYSTERECTOMY W/ BILATERAL SALPINGOOPHORECTOMY  ~ 1986   W/ BILATERAL SALPINGOOPHORECTOMY   TRANSTHORACIC ECHOCARDIOGRAM  11-17-2010   MILD LVH/ EF 60-65%/ GRADE I DIASTOLIC DYSFUNCTION/ MILD LAE / MILD RAE    Current Medications: Current Meds  Medication Sig   albuterol (PROVENTIL) (2.5 MG/3ML) 0.083%  nebulizer solution Take 3 mLs (2.5 mg total) by nebulization every 6 (six) hours as needed for wheezing or shortness of breath.   atorvastatin (LIPITOR) 80 MG tablet Take 0.5 tablets (40 mg total) by mouth every evening.   Cholecalciferol 25 MCG (1000 UT) capsule Take 2 capsules (2,000 Units total) by mouth daily.   ciclopirox (PENLAC) 8 % solution Apply topically at bedtime. Apply over nail and surrounding skin. Apply daily over previous coat. After seven (7) days, may remove with alcohol and continue cycle.   furosemide (LASIX) 40 MG tablet Take 1 tablet (40 mg total) by mouth daily.   losartan (COZAAR) 50 MG tablet Take 1 tablet (50 mg total) by mouth daily.   metoprolol tartrate (LOPRESSOR) 50 MG tablet Take 1 tablet (50 mg total) by mouth 2 (two) times daily.   potassium chloride SA (KLOR-CON M20) 20 MEQ tablet Take 1 tablet (20 mEq total) by mouth daily.   rivaroxaban (XARELTO) 20 MG TABS tablet Take 1 tablet (20 mg total) by mouth daily with supper.     Allergies:   Codeine, Hydrocodone, Oxycodone, and Tramadol   Social History   Socioeconomic History   Marital status: Single    Spouse name: Not on file   Number of children: Not on file   Years of education: Not on file   Highest education level: Not on file  Occupational History   Occupation:  housekeeper    Employer: [employer]  Tobacco Use   Smoking status: Never   Smokeless tobacco: Never  Substance and Sexual Activity   Alcohol use: No   Drug use: No   Sexual activity: Never  Other Topics Concern   Not on file  Social History Narrative   Not on file   Social Determinants of Health   Financial Resource Strain: Not on file  Food Insecurity: Not on file  Transportation Needs: Not on file  Physical Activity: Not on file  Stress: Not on file  Social Connections: Not on file     Family History: The patient's family history includes Diabetes in her brother; Heart attack in her father and another family member;  Hypertension in her brother; Stroke in her brother; Sudden death in her brother.  ROS:   Review of Systems  Constitution: Negative for decreased appetite, fever and weight gain.  HENT: Negative for congestion, ear discharge, hoarse voice and sore throat.   Eyes: Negative for discharge, redness, vision loss in right eye and visual halos.  Cardiovascular: Negative for chest pain, dyspnea on exertion, leg swelling, orthopnea and palpitations.  Respiratory: Negative for cough, hemoptysis, shortness of breath and snoring.   Endocrine: Negative for heat intolerance and polyphagia.  Hematologic/Lymphatic: Negative for bleeding problem. Does not bruise/bleed easily.  Skin: Negative for flushing, nail changes, rash and suspicious lesions.  Musculoskeletal: Negative for arthritis, joint pain, muscle cramps, myalgias, neck pain and stiffness.  Gastrointestinal: Negative for abdominal pain, bowel incontinence, diarrhea and excessive appetite.  Genitourinary: Negative for decreased libido, genital sores and incomplete emptying.  Neurological: Negative for brief paralysis, focal weakness, headaches and loss of balance.  Psychiatric/Behavioral: Negative for altered mental status, depression and suicidal ideas.  Allergic/Immunologic: Negative for HIV exposure and persistent infections.    EKGs/Labs/Other Studies Reviewed:    The following studies were reviewed today:   EKG:  The ekg ordered today demonstrates   July 22, 2018 echo Study Conclusions  - Left ventricle: Not well visualized. LV poorly visualized even    with echocontrast. Grossly LV systolic function is normal, >50%    Doppler parameters are consistent with abnormal left ventricular    relaxation (grade 1 diastolic dysfunction).  - Aortic valve: Valve area (VTI): 3.63 cm^2. Valve area (Vmax):    2.83 cm^2. Valve area (Vmean): 2.89 cm^2.  - Technically difficult study. Echocontrast was used to enhance    visualization. Limited  visualization even with echocontrast,    suboptimal study.   -------------------------------------------------------------------  Echo (11/22/12): Moderate LVH, EF 60%, grade 1 diastolic dysfunction, mild LAE, RV function could not be assessed   Chest CTA (11/21/12): IMPRESSION:  1. Large bilateral central pulmonary emboli. Overall clot burden is severe.  2. No evidence of pulmonary infarction.  3. Small right middle lobe pulmonary nodule. If the patient is at high risk for bronchogenic carcinoma, follow-up chest CT at 1 year is recommended. If the patient is at low risk, no follow-up is needed. This recommendation follows the consensus statement:  Guidelines for Management of Small Pulmonary Nodules Detected on CT Scans: A Statement from the Fleischner Society as published in Radiology 2005; 237:395-400.   Venous Duplex (11/22/12): Summary:  Findings consistent with acute, occluisvedeep vein thrombosis involving the right poplitealvein and the bilateral posterior tibial veins of thelower extremity.  Recent Labs: 05/10/2021: BUN 19; Creatinine, Ser 0.94; Hemoglobin 14.7; Magnesium 2.2; Platelets 293; Potassium 4.6; Sodium 142  Recent Lipid Panel    Component Value Date/Time  CHOL 168 05/10/2021 1007   TRIG 47 05/10/2021 1007   HDL 55 05/10/2021 1007   CHOLHDL 3.1 05/10/2021 1007   CHOLHDL 4.5 07/22/2018 0951   VLDL 7 07/22/2018 0951   LDLCALC 103 (H) 05/10/2021 1007   LDLCALC 83 08/10/2017 1101    Physical Exam:    VS:  BP 120/90 (BP Location: Right Arm)   Pulse 73   Ht 5\' 3"  (1.6 m)   Wt (!) 314 lb 9.6 oz (142.7 kg)   SpO2 98%   BMI 55.73 kg/m     Wt Readings from Last 3 Encounters:  05/10/21 (!) 314 lb 9.6 oz (142.7 kg)  11/18/19 280 lb (127 kg)  08/21/18 (!) 325 lb 14.4 oz (147.8 kg)     GEN: Well nourished, well developed in no acute distress HEENT: Normal NECK: No JVD; No carotid bruits LYMPHATICS: No lymphadenopathy CARDIAC: S1S2 noted,RRR, no murmurs, rubs,  gallops RESPIRATORY:  Clear to auscultation without rales, wheezing or rhonchi  ABDOMEN: Soft, non-tender, non-distended, +bowel sounds, no guarding. EXTREMITIES: +2 bilateral lower extremity edema, No cyanosis, no clubbing MUSCULOSKELETAL:  No deformity  SKIN: Warm and dry NEUROLOGIC:  Alert and oriented x 3, non-focal PSYCHIATRIC:  Normal affect, good insight  ASSESSMENT:    1. SOB (shortness of breath)   2. OSA (obstructive sleep apnea)   3. Medication management   4. Mixed hyperlipidemia   5. Vitamin D deficiency   6. Morbid obesity (HCC)    PLAN:    Her shortness of breath is worsening, this could be multifactorial given her deconditioning with her morbid obesity and sleep apnea.  What I would I like to do at this time is to repeat her echocardiogram to reassess her LV function. Started patient on Lasix 40 mg daily along with potassium supplement she does have significant leg edema. Follow-up to be done BMP, mag, CBC and vitamin D Hyperlipidemia repeat lipid profile Continue with CPAP. The patient understands the need to lose weight with diet and exercise. We have discussed specific strategies for this.  The patient is in agreement with the above plan. The patient left the office in stable condition.  The patient will follow up in 3 months     Medication Adjustments/Labs and Tests Ordered: Current medicines are reviewed at length with the patient today.  Concerns regarding medicines are outlined above.  Orders Placed This Encounter  Procedures   Basic Metabolic Panel (BMET)   Magnesium   CBC with Differential/Platelet   Lipid panel   VITAMIN D 25 Hydroxy (Vit-D Deficiency, Fractures)   EKG 12-Lead   ECHOCARDIOGRAM COMPLETE   Meds ordered this encounter  Medications   furosemide (LASIX) 40 MG tablet    Sig: Take 1 tablet (40 mg total) by mouth daily.    Dispense:  90 tablet    Refill:  3   potassium chloride SA (KLOR-CON M20) 20 MEQ tablet    Sig: Take 1 tablet  (20 mEq total) by mouth daily.    Dispense:  90 tablet    Refill:  3    Patient Instructions  Medication Instructions:  Your physician has recommended you make the following change in your medication:  START: Lasix 40 mg once daily START: Potassium 20 meq once daily *If you need a refill on your cardiac medications before your next appointment, please call your pharmacy*   Lab Work: Your physician recommends that you return for lab work in:  TODAY: Lipids, BMET, Mag, CBC, Vitamin D If you have  labs (blood work) drawn today and your tests are completely normal, you will receive your results only by: MyChart Message (if you have MyChart) OR A paper copy in the mail If you have any lab test that is abnormal or we need to change your treatment, we will call you to review the results.   Testing/Procedures: Your physician has requested that you have an echocardiogram. Echocardiography is a painless test that uses sound waves to create images of your heart. It provides your doctor with information about the size and shape of your heart and how well your heart's chambers and valves are working. This procedure takes approximately one hour. There are no restrictions for this procedure.   Follow-Up: At [facility], you and your health needs are our priority.  As part of our continuing mission to provide you with exceptional heart care, we have created designated Provider Care Teams.  These Care Teams include your primary Cardiologist (physician) and Advanced Practice Providers (APPs -  Physician Assistants and Nurse Practitioners) who all work together to provide you with the care you need, when you need it.  We recommend signing up for the patient portal called "MyChart".  Sign up information is provided on this After Visit Summary.  MyChart is used to connect with patients for Virtual Visits (Telemedicine).  Patients are able to view lab/test results, encounter notes, upcoming appointments,  etc.  Non-urgent messages can be sent to your provider as well.   To learn more about what you can do with MyChart, go to [email or web address].    Your next appointment:   3 month(s)  The format for your next appointment:   In Person  Provider:   Thomasene Ripple, DO   Other Instructions  Echocardiogram An echocardiogram is a test that uses sound waves (ultrasound) to produce images of the heart. Images from an echocardiogram can provide important information about: Heart size and shape. The size and thickness and movement of your heart's walls. Heart muscle function and strength. Heart valve function or if you have stenosis. Stenosis is when the heart valves are too narrow. If blood is flowing backward through the heart valves (regurgitation). A tumor or infectious growth around the heart valves. Areas of heart muscle that are not working well because of poor blood flow or injury from a heart attack. Aneurysm detection. An aneurysm is a weak or damaged part of an artery wall. The wall bulges out from the normal force of blood pumping through the body. Tell a health care provider about: Any allergies you have. All medicines you are taking, including vitamins, herbs, eye drops, creams, and over-the-counter medicines. Any blood disorders you have. Any surgeries you have had. Any medical conditions you have. Whether you are pregnant or may be pregnant. What are the risks? Generally, this is a safe test. However, problems may occur, including an allergic reaction to dye (contrast) that may be used during the test. What happens before the test? No specific preparation is needed. You may eat and drink normally. What happens during the test?  You will take off your clothes from the waist up and put on a hospital gown. Electrodes or electrocardiogram (ECG)patches may be placed on your chest. The electrodes or patches are then connected to a device that monitors your heart rate and  rhythm. You will lie down on a table for an ultrasound exam. A gel will be applied to your chest to help sound waves pass through your skin. A handheld device, called a  transducer, will be pressed against your chest and moved over your heart. The transducer produces sound waves that travel to your heart and bounce back (or "echo" back) to the transducer. These sound waves will be captured in real-time and changed into images of your heart that can be viewed on a video monitor. The images will be recorded on a computer and reviewed by your health care provider. You may be asked to change positions or hold your breath for a short time. This makes it easier to get different views or better views of your heart. In some cases, you may receive contrast through an IV in one of your veins. This can improve the quality of the pictures from your heart. The procedure may vary among health care providers and hospitals. What can I expect after the test? You may return to your normal, everyday life, including diet, activities, and medicines, unless your health care provider tells you not to do that. Follow these instructions at home: It is up to you to get the results of your test. Ask your health care provider, or the department that is doing the test, when your results will be ready. Keep all follow-up visits. This is important. Summary An echocardiogram is a test that uses sound waves (ultrasound) to produce images of the heart. Images from an echocardiogram can provide important information about the size and shape of your heart, heart muscle function, heart valve function, and other possible heart problems. You do not need to do anything to prepare before this test. You may eat and drink normally. After the echocardiogram is completed, you may return to your normal, everyday life, unless your health care provider tells you not to do that. This information is not intended to replace advice given to you by your  health care provider. Make sure you discuss any questions you have with your health care provider. Document Revised: 02/24/2021 Document Reviewed: 02/04/2020 Elsevier Patient Education  2022 Elsevier Inc.   Adopting a Healthy Lifestyle.  Know what a healthy weight is for you (roughly BMI <25) and aim to maintain this   Aim for 7+ servings of fruits and vegetables daily   65-80+ fluid ounces of water or unsweet tea for healthy kidneys   Limit to max 1 drink of alcohol per day; avoid smoking/tobacco   Limit animal fats in diet for cholesterol and heart health - choose grass fed whenever available   Avoid highly processed foods, and foods high in saturated/trans fats   Aim for low stress - take time to unwind and care for your mental health   Aim for 150 min of moderate intensity exercise weekly for heart health, and weights twice weekly for bone health   Aim for 7-9 hours of sleep daily   When it comes to diets, agreement about the perfect plan isnt easy to find, even among the experts. Experts at the [facility] of [employer] developed an idea known as the Healthy Eating Plate. Just imagine a plate divided into logical, healthy portions.   The emphasis is on diet quality:   Load up on vegetables and fruits - one-half of your plate: Aim for color and variety, and remember that potatoes dont count.   Go for whole grains - one-quarter of your plate: Whole wheat, barley, wheat berries, quinoa, oats, brown rice, and foods made with them. If you want pasta, go with whole wheat pasta.   Protein power - one-quarter of your plate: Fish, chicken, beans, and nuts  are all healthy, versatile protein sources. Limit red meat.   The diet, however, does go beyond the plate, offering a few other suggestions.   Use healthy plant oils, such as olive, canola, soy, corn, sunflower and peanut. Check the labels, and avoid partially hydrogenated oil, which have unhealthy trans fats.   If youre  thirsty, drink water. Coffee and tea are good in moderation, but skip sugary drinks and limit milk and dairy products to one or two daily servings.   The type of carbohydrate in the diet is more important than the amount. Some sources of carbohydrates, such as vegetables, fruits, whole grains, and beans-are healthier than others.   Finally, stay active  Signed, Thomasene Ripple, DO  05/11/2021 7:53 PM    [facility]

## 2021-05-10 NOTE — Patient Instructions (Addendum)
Medication Instructions:  Your physician has recommended you make the following change in your medication:  START: Lasix 40 mg once daily START: Potassium 20 meq once daily *If you need a refill on your cardiac medications before your next appointment, please call your pharmacy*   Lab Work: Your physician recommends that you return for lab work in:  TODAY: Lipids, BMET, Mag, CBC, Vitamin D If you have labs (blood work) drawn today and your tests are completely normal, you will receive your results only by: MyChart Message (if you have MyChart) OR A paper copy in the mail If you have any lab test that is abnormal or we need to change your treatment, we will call you to review the results.   Testing/Procedures: Your physician has requested that you have an echocardiogram. Echocardiography is a painless test that uses sound waves to create images of your heart. It provides your doctor with information about the size and shape of your heart and how well your heart's chambers and valves are working. This procedure takes approximately one hour. There are no restrictions for this procedure.   Follow-Up: At [facility], you and your health needs are our priority.  As part of our continuing mission to provide you with exceptional heart care, we have created designated Provider Care Teams.  These Care Teams include your primary Cardiologist (physician) and Advanced Practice Providers (APPs -  Physician Assistants and Nurse Practitioners) who all work together to provide you with the care you need, when you need it.  We recommend signing up for the patient portal called "MyChart".  Sign up information is provided on this After Visit Summary.  MyChart is used to connect with patients for Virtual Visits (Telemedicine).  Patients are able to view lab/test results, encounter notes, upcoming appointments, etc.  Non-urgent messages can be sent to your provider as well.   To learn more about what you can do  with MyChart, go to [email or web address].    Your next appointment:   3 month(s)  The format for your next appointment:   In Person  Provider:   Thomasene Ripple, DO   Other Instructions  Echocardiogram An echocardiogram is a test that uses sound waves (ultrasound) to produce images of the heart. Images from an echocardiogram can provide important information about: Heart size and shape. The size and thickness and movement of your heart's walls. Heart muscle function and strength. Heart valve function or if you have stenosis. Stenosis is when the heart valves are too narrow. If blood is flowing backward through the heart valves (regurgitation). A tumor or infectious growth around the heart valves. Areas of heart muscle that are not working well because of poor blood flow or injury from a heart attack. Aneurysm detection. An aneurysm is a weak or damaged part of an artery wall. The wall bulges out from the normal force of blood pumping through the body. Tell a health care provider about: Any allergies you have. All medicines you are taking, including vitamins, herbs, eye drops, creams, and over-the-counter medicines. Any blood disorders you have. Any surgeries you have had. Any medical conditions you have. Whether you are pregnant or may be pregnant. What are the risks? Generally, this is a safe test. However, problems may occur, including an allergic reaction to dye (contrast) that may be used during the test. What happens before the test? No specific preparation is needed. You may eat and drink normally. What happens during the test?  You will take off your clothes  from the waist up and put on a hospital gown. Electrodes or electrocardiogram (ECG)patches may be placed on your chest. The electrodes or patches are then connected to a device that monitors your heart rate and rhythm. You will lie down on a table for an ultrasound exam. A gel will be applied to your chest to help  sound waves pass through your skin. A handheld device, called a transducer, will be pressed against your chest and moved over your heart. The transducer produces sound waves that travel to your heart and bounce back (or "echo" back) to the transducer. These sound waves will be captured in real-time and changed into images of your heart that can be viewed on a video monitor. The images will be recorded on a computer and reviewed by your health care provider. You may be asked to change positions or hold your breath for a short time. This makes it easier to get different views or better views of your heart. In some cases, you may receive contrast through an IV in one of your veins. This can improve the quality of the pictures from your heart. The procedure may vary among health care providers and hospitals. What can I expect after the test? You may return to your normal, everyday life, including diet, activities, and medicines, unless your health care provider tells you not to do that. Follow these instructions at home: It is up to you to get the results of your test. Ask your health care provider, or the department that is doing the test, when your results will be ready. Keep all follow-up visits. This is important. Summary An echocardiogram is a test that uses sound waves (ultrasound) to produce images of the heart. Images from an echocardiogram can provide important information about the size and shape of your heart, heart muscle function, heart valve function, and other possible heart problems. You do not need to do anything to prepare before this test. You may eat and drink normally. After the echocardiogram is completed, you may return to your normal, everyday life, unless your health care provider tells you not to do that. This information is not intended to replace advice given to you by your health care provider. Make sure you discuss any questions you have with your health care  provider. Document Revised: 02/24/2021 Document Reviewed: 02/04/2020 Elsevier Patient Education  2022 [employer].

## 2021-05-11 LAB — CBC WITH DIFFERENTIAL/PLATELET
Basophils Absolute: 0.1 10*3/uL (ref 0.0–0.2)
Basos: 0 %
EOS (ABSOLUTE): 0.1 10*3/uL (ref 0.0–0.4)
Eos: 1 %
Hematocrit: 44.8 % (ref 34.0–46.6)
Hemoglobin: 14.7 g/dL (ref 11.1–15.9)
Immature Grans (Abs): 0 10*3/uL (ref 0.0–0.1)
Immature Granulocytes: 0 %
Lymphocytes Absolute: 1.6 10*3/uL (ref 0.7–3.1)
Lymphs: 13 %
MCH: 29.1 pg (ref 26.6–33.0)
MCHC: 32.8 g/dL (ref 31.5–35.7)
MCV: 89 fL (ref 79–97)
Monocytes Absolute: 0.9 10*3/uL (ref 0.1–0.9)
Monocytes: 7 %
Neutrophils Absolute: 10.2 10*3/uL — ABNORMAL HIGH (ref 1.4–7.0)
Neutrophils: 79 %
Platelets: 293 10*3/uL (ref 150–450)
RBC: 5.06 x10E6/uL (ref 3.77–5.28)
RDW: 11.3 % — ABNORMAL LOW (ref 11.7–15.4)
WBC: 13 10*3/uL — ABNORMAL HIGH (ref 3.4–10.8)

## 2021-05-11 LAB — LIPID PANEL
Chol/HDL Ratio: 3.1 ratio (ref 0.0–4.4)
Cholesterol, Total: 168 mg/dL (ref 100–199)
HDL: 55 mg/dL (ref >39)
LDL Chol Calc (NIH): 103 mg/dL — ABNORMAL HIGH (ref 0–99)
Triglycerides: 47 mg/dL (ref 0–149)
VLDL Cholesterol Cal: 10 mg/dL (ref 5–40)

## 2021-05-11 LAB — BASIC METABOLIC PANEL
BUN/Creatinine Ratio: 20 (ref 12–28)
BUN: 19 mg/dL (ref 8–27)
CO2: 27 mmol/L (ref 20–29)
Calcium: 9.7 mg/dL (ref 8.7–10.3)
Chloride: 100 mmol/L (ref 96–106)
Creatinine, Ser: 0.94 mg/dL (ref 0.57–1.00)
Glucose: 164 mg/dL — ABNORMAL HIGH (ref 70–99)
Potassium: 4.6 mmol/L (ref 3.5–5.2)
Sodium: 142 mmol/L (ref 134–144)
eGFR: 66 mL/min/{1.73_m2} (ref >59)

## 2021-05-11 LAB — MAGNESIUM: Magnesium: 2.2 mg/dL (ref 1.6–2.3)

## 2021-05-11 LAB — VITAMIN D 25 HYDROXY (VIT D DEFICIENCY, FRACTURES): Vit D, 25-Hydroxy: 14.7 ng/mL — ABNORMAL LOW (ref 30.0–100.0)

## 2021-05-17 ENCOUNTER — Other Ambulatory Visit: Payer: Self-pay

## 2021-05-17 MED ORDER — VITAMIN D (ERGOCALCIFEROL) 1.25 MG (50000 UNIT) PO CAPS
50000.0000 [IU] | ORAL_CAPSULE | ORAL | 0 refills | Status: DC
Start: 2021-05-17 — End: 2022-10-04

## 2021-06-22 ENCOUNTER — Ambulatory Visit (HOSPITAL_COMMUNITY): Payer: Medicare Other | Attending: Cardiovascular Disease

## 2021-06-22 ENCOUNTER — Other Ambulatory Visit: Payer: Self-pay

## 2021-06-22 DIAGNOSIS — R0602 Shortness of breath: Secondary | ICD-10-CM | POA: Diagnosis present

## 2021-06-22 LAB — ECHOCARDIOGRAM COMPLETE
Area-P 1/2: 3.21 cm2
S' Lateral: 3.7 cm

## 2021-06-22 MED ORDER — PERFLUTREN LIPID MICROSPHERE
3.0000 mL | INTRAVENOUS | Status: AC | PRN
Start: 2021-06-22 — End: 2021-06-22
  Administered 2021-06-22: 3 mL via INTRAVENOUS

## 2021-07-23 ENCOUNTER — Telehealth: Payer: Self-pay | Admitting: Cardiology

## 2021-07-23 NOTE — Telephone Encounter (Signed)
Patient wants to know when the mammogram mobile unit will be by [facility].  She said it was discussed at her last visit.

## 2021-07-23 NOTE — Telephone Encounter (Signed)
Called pt to let her know I do not know when the mammogram mobile unit will be at [facility]. Left the number ([phone]) to make an appt and they should be able to tell her when they will be at [facility]. No answer at this time. Left message for her.

## 2021-07-26 ENCOUNTER — Other Ambulatory Visit: Payer: Self-pay | Admitting: Family Medicine

## 2021-07-26 DIAGNOSIS — Z1231 Encounter for screening mammogram for malignant neoplasm of breast: Secondary | ICD-10-CM

## 2021-07-29 DIAGNOSIS — H5213 Myopia, bilateral: Secondary | ICD-10-CM | POA: Diagnosis not present

## 2021-08-05 ENCOUNTER — Ambulatory Visit
Admission: RE | Admit: 2021-08-05 | Discharge: 2021-08-05 | Disposition: A | Discharge status: Home / Self Care | Payer: Medicare Other | Source: Ambulatory Visit | Attending: Family Medicine | Admitting: Family Medicine

## 2021-08-05 ENCOUNTER — Other Ambulatory Visit: Payer: Self-pay

## 2021-08-05 DIAGNOSIS — Z1231 Encounter for screening mammogram for malignant neoplasm of breast: Secondary | ICD-10-CM

## 2021-08-06 ENCOUNTER — Ambulatory Visit (INDEPENDENT_AMBULATORY_CARE_PROVIDER_SITE_OTHER): Payer: Medicare Other | Admitting: Podiatry

## 2021-08-06 ENCOUNTER — Encounter: Payer: Self-pay | Admitting: Podiatry

## 2021-08-06 DIAGNOSIS — M79675 Pain in left toe(s): Secondary | ICD-10-CM | POA: Diagnosis not present

## 2021-08-06 DIAGNOSIS — B353 Tinea pedis: Secondary | ICD-10-CM

## 2021-08-06 DIAGNOSIS — M79674 Pain in right toe(s): Secondary | ICD-10-CM

## 2021-08-06 DIAGNOSIS — B351 Tinea unguium: Secondary | ICD-10-CM | POA: Diagnosis not present

## 2021-08-06 MED ORDER — CLOTRIMAZOLE 1 % EX CREA: TOPICAL_CREAM | CUTANEOUS | 1 refills | Status: AC

## 2021-08-06 NOTE — Patient Instructions (Signed)
Athlete's Foot Athlete's foot (tinea pedis) is a fungal infection of the skin on your feet. It often occurs on the skin that is between or underneath the toes. It can also occur on the soles of your feet. The infection can spread from person to person (is contagious). It can also spread when a person's bare feet come in contact with the fungus on shower floors or on items such as shoes. What are the causes? This condition is caused by a fungus that grows in warm, moist places. You can get athlete's foot by sharing shoes, shower stalls, towels, and wet floors with someone who is infected. Not washing your feet or changing your socks often enough can also lead to athlete's foot. What increases the risk? This condition is more likely to develop in: Men. People who have a weak body defense system (immune system). People who have diabetes. People who use public showers, such as at a gym. People who wear heavy-duty shoes, such as industrial or military shoes. Seasons with warm, humid weather. What are the signs or symptoms? Symptoms of this condition include: Itchy areas between your toes or on the soles of your feet. White, flaky, or scaly areas between your toes or on the soles of your feet. Very itchy small blisters between your toes or on the soles of your feet. Small cuts in your skin. These cuts can become infected. Thick or discolored toenails. How is this diagnosed? This condition may be diagnosed with a physical exam and a review of your medical history. Your health care provider may also take a skin or toenail sample to examine under a microscope. How is this treated? This condition is treated with antifungal medicines. These may be applied as powders, ointments, or creams. In severe cases, an oral antifungal medicine may be given. Follow these instructions at home: Medicines Apply or take over-the-counter and prescription medicines only as told by your health care provider. Apply your  antifungal medicine as told by your health care provider. Do not stop using the antifungal even if your condition improves. Foot care Do not scratch your feet. Keep your feet dry: Wear cotton or wool socks. Change your socks every day or if they become wet. Wear shoes that allow air to flow, such as sandals or canvas tennis shoes. Wash and dry your feet, including the area between your toes. Also, wash and dry your feet: Every day or as told by your health care provider. After exercising. General instructions Do not let others use towels, shoes, nail clippers, or other personal items that touch your feet. Protect your feet by wearing sandals in wet areas, such as locker rooms and shared showers. Keep all follow-up visits. This is important. If you have diabetes, keep your blood sugar under control. Contact a health care provider if: You have a fever. You have swelling, soreness, warmth, or redness in your foot. Your feet are not getting better with treatment. Your symptoms get worse. You have new symptoms. You have severe pain. Summary Athlete's foot (tinea pedis) is a fungal infection of the skin on your feet. It often occurs on skin that is between or underneath the toes. This condition is caused by a fungus that grows in warm, moist places. Symptoms include white, flaky, or scaly areas between your toes or on the soles of your feet. This condition is treated with antifungal medicines. Keep your feet clean. Always dry them thoroughly. This information is not intended to replace advice given to you by   your health care provider. Make sure you discuss any questions you have with your health care provider. Document Revised: 10/04/2020 Document Reviewed: 10/04/2020 Elsevier Patient Education  2022 Elsevier Inc.    

## 2021-08-09 ENCOUNTER — Other Ambulatory Visit: Payer: Self-pay | Admitting: Family Medicine

## 2021-08-09 DIAGNOSIS — R928 Other abnormal and inconclusive findings on diagnostic imaging of breast: Secondary | ICD-10-CM

## 2021-08-12 NOTE — Progress Notes (Signed)
Subjective: Kerry Liu is a 71 y.o. female patient seen today for follow up of  painful elongated mycotic toenails 1-5 bilaterally which are tender when wearing enclosed shoe gear. Pain is relieved with periodic professional debridement..   Patient relates itching of feet between toes which has been present for several weeks. Denies any redness, drainage or swelling.  New problem(s)/concern(s) today: None    PCP is Margot Ables, MD.  Allergies  Allergen Reactions   Codeine Itching   Hydrocodone Itching   Oxycodone Rash   Tramadol Rash    Objective: Physical Exam  General: Patient is a pleasant 71 y.o. African American female morbidly obese in NAD. AAO x 3.   Neurovascular Examination: CFT <3 seconds b/l LE. Palpable DP/PT pulses b/l LE. Digital hair present b/l. Skin temperature gradient WNL b/l. No pain with calf compression b/l. No edema noted b/l. No cyanosis or clubbing noted b/l LE.  Protective sensation intact 5/5 intact bilaterally with 10g monofilament b/l. Proprioception intact bilaterally.  Dermatological:  Pedal integument with normal turgor, texture and tone BLE. No open wounds b/l LE. Toenails 1-5 b/l elongated, discolored, dystrophic, thickened, crumbly with subungual debris and tenderness to dorsal palpation. Diffuse scaling noted peripherally and plantarly b/l feet.  No interdigital macerations.  No blisters, no weeping. No signs of secondary bacterial infection noted.  Musculoskeletal:  Normal muscle strength 5/5 to all lower extremity muscle groups bilaterally. No pain, crepitus or joint limitation noted with ROM b/l LE. No gross bony pedal deformities b/l. Patient ambulates independently without assistive aids.  Assessment: 1. Pain due to onychomycosis of toenails of both feet   2. Tinea pedis of both feet    Plan: Patient was evaluated and treated and all questions answered. Consent given for treatment as described below: -Mycotic toenails  1-5 bilaterally were debrided in length and girth with sterile nail nippers and dremel without incident. --For tinea pedis, Rx sent to pharmacy for Clotrimazole Cream 1% to be applied toboth feet twice daily for 6 weeks. -Patient/POA to call should there be question/concern in the interim.  Return in about 3 months (around 11/03/2021).  Freddie Breech, DPM

## 2021-08-17 ENCOUNTER — Ambulatory Visit (INDEPENDENT_AMBULATORY_CARE_PROVIDER_SITE_OTHER): Payer: Medicare Other | Admitting: Cardiology

## 2021-08-17 ENCOUNTER — Other Ambulatory Visit: Payer: Self-pay

## 2021-08-17 ENCOUNTER — Encounter: Payer: Self-pay | Admitting: Cardiology

## 2021-08-17 VITALS — BP 138/87 | HR 116 | Ht 62.0 in | Wt 315.2 lb

## 2021-08-17 DIAGNOSIS — R0602 Shortness of breath: Secondary | ICD-10-CM

## 2021-08-17 DIAGNOSIS — Z79899 Other long term (current) drug therapy: Secondary | ICD-10-CM | POA: Diagnosis not present

## 2021-08-17 NOTE — Progress Notes (Signed)
Cardiology Office Note:    Date:  08/18/2021   ID:  Kerry Liu, DOB 1951/02/16, MRN [id number]  PCP:  Margot Ables, MD  Cardiologist:  Thomasene Ripple, DO  Electrophysiologist:  None   Referring MD: Margot Ables*   " I am doing fine- still shortness of breath"  History of Present Illness:    Kerry Liu is a 71 y.o. 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    Past Medical History:  Diagnosis Date   Acute meniscal tear of knee    RIGHT   Arthritis    "right knee" (11/21/2012)   Elevated hemoglobin A1c    Exertional shortness of breath    "this week" (11/21/2012)   History of DVT (deep vein thrombosis)    IN PREGNANCY   HTN (hypertension)    CARDIOLOGIST--  DR HOCHREIN-- CLEARANCE NOTE IN EPIC AND CHART FROM 08-21-2012   Mixed hyperlipidemia    Obesity    Pulmonary embolism (HCC)    "3 today" (11/21/2012)   RBBB (right bundle branch block with left anterior fascicular block)    Seasonal allergies    Sinus tachycardia    Sleep apnea    Vitamin D deficiency     Past Surgical History:  Procedure Laterality Date   IVC filter placed in May 2014 N/A    KNEE ARTHROSCOPY Right 12-09-2002   KNEE ARTHROSCOPY WITH MEDIAL MENISECTOMY Right 10/05/2012   Procedure: KNEE ARTHROSCOPY WITH MEDIAL MENISECTOMY;  Surgeon: Jacki Cones, MD;  Location: [facility];  Service: Orthopedics;  Laterality: Right;   TOTAL ABDOMINAL HYSTERECTOMY W/ BILATERAL SALPINGOOPHORECTOMY  ~ 1986   W/ BILATERAL SALPINGOOPHORECTOMY   TRANSTHORACIC ECHOCARDIOGRAM  11-17-2010   MILD LVH/ EF 60-65%/ GRADE I DIASTOLIC DYSFUNCTION/ MILD LAE / MILD RAE    Current Medications: Current Meds  Medication Sig   albuterol (PROVENTIL) (2.5 MG/3ML) 0.083% nebulizer solution Take 3 mLs (2.5 mg total) by nebulization every 6 (six) hours as needed for  wheezing or shortness of breath.   amLODipine (NORVASC) 10 MG tablet Take 1 tablet (10 mg total) by mouth daily.   atorvastatin (LIPITOR) 80 MG tablet Take 0.5 tablets (40 mg total) by mouth every evening.   Cholecalciferol 25 MCG (1000 UT) capsule Take 2 capsules (2,000 Units total) by mouth daily.   ciclopirox (PENLAC) 8 % solution Apply topically at bedtime. Apply over nail and surrounding skin. Apply daily over previous coat. After seven (7) days, may remove with alcohol and continue cycle.   clotrimazole (LOTRIMIN) 1 % cream Apply to affected feet and between toes twice daily for 6 weeks for athlete's feet   furosemide (LASIX) 40 MG tablet Take 1 tablet (40 mg total) by mouth daily.   losartan (COZAAR) 50 MG tablet Take 1 tablet (50 mg total) by mouth daily.   metoprolol tartrate (LOPRESSOR) 50 MG tablet Take 1 tablet (50 mg total) by mouth 2 (two) times daily.   potassium chloride SA (KLOR-CON M20) 20 MEQ tablet Take 1 tablet (20 mEq total) by mouth daily.   rivaroxaban (XARELTO) 20 MG TABS tablet Take 1 tablet (20 mg total) by mouth daily with supper.   Vitamin D, Ergocalciferol, (DRISDOL) 1.25 MG (50000 UNIT) CAPS capsule Take 1 capsule (50,000 Units total) by mouth every 7 (seven) days.     Allergies:   Codeine, Hydrocodone, Oxycodone, and Tramadol   Social History  Socioeconomic History   Marital status: Single    Spouse name: Not on file   Number of children: Not on file   Years of education: Not on file   Highest education level: Not on file  Occupational History   Occupation: housekeeper    Employer: [employer]  Tobacco Use   Smoking status: Never   Smokeless tobacco: Never  Substance and Sexual Activity   Alcohol use: No   Drug use: No   Sexual activity: Never  Other Topics Concern   Not on file  Social History Narrative   Not on file   Social Determinants of Health   Financial Resource Strain: Not on file  Food Insecurity: Not on file  Transportation  Needs: Not on file  Physical Activity: Not on file  Stress: Not on file  Social Connections: Not on file     Family History: The patient's family history includes Diabetes in her brother; Heart attack in her father and another family member; Hypertension in her brother; Stroke in her brother; Sudden death in her brother. There is no history of Breast cancer.  ROS:   Review of Systems  Constitution: Negative for decreased appetite, fever and weight gain.  HENT: Negative for congestion, ear discharge, hoarse voice and sore throat.   Eyes: Negative for discharge, redness, vision loss in right eye and visual halos.  Cardiovascular: Negative for chest pain, dyspnea on exertion, leg swelling, orthopnea and palpitations.  Respiratory: Negative for cough, hemoptysis, shortness of breath and snoring.   Endocrine: Negative for heat intolerance and polyphagia.  Hematologic/Lymphatic: Negative for bleeding problem. Does not bruise/bleed easily.  Skin: Negative for flushing, nail changes, rash and suspicious lesions.  Musculoskeletal: Negative for arthritis, joint pain, muscle cramps, myalgias, neck pain and stiffness.  Gastrointestinal: Negative for abdominal pain, bowel incontinence, diarrhea and excessive appetite.  Genitourinary: Negative for decreased libido, genital sores and incomplete emptying.  Neurological: Negative for brief paralysis, focal weakness, headaches and loss of balance.  Psychiatric/Behavioral: Negative for altered mental status, depression and suicidal ideas.  Allergic/Immunologic: Negative for HIV exposure and persistent infections.    EKGs/Labs/Other Studies Reviewed:    The following studies were reviewed today:   EKG:  None today   TTE 06/22/2021 IMPRESSIONS     1. Left ventricular ejection fraction, by estimation, is 60 to 65%. The  left ventricle has normal function. The left ventricle has no regional  wall motion abnormalities. There is moderate concentric  left ventricular  hypertrophy. Left ventricular  diastolic parameters are consistent with Grade I diastolic dysfunction  (impaired relaxation). Elevated left ventricular end-diastolic pressure.   2. Right ventricular systolic function is normal. The right ventricular  size is normal.   3. The mitral valve is normal in structure. No evidence of mitral valve  regurgitation. No evidence of mitral stenosis.   4. The aortic valve was not well visualized. Aortic valve regurgitation  is not visualized. No aortic stenosis is present.   5. The inferior vena cava is normal in size with greater than 50%  respiratory variability, suggesting right atrial pressure of 3 mmHg.   FINDINGS   Left Ventricle: Left ventricular ejection fraction, by estimation, is 60  to 65%. The left ventricle has normal function. The left ventricle has no  regional wall motion abnormalities. Definity contrast agent was given IV  to delineate the left ventricular   endocardial borders. The left ventricular internal cavity size was normal  in size. There is moderate concentric left ventricular hypertrophy.  Left  ventricular diastolic parameters are consistent with Grade I diastolic  dysfunction (impaired relaxation).  Elevated left ventricular end-diastolic pressure.   Right Ventricle: The right ventricular size is normal. No increase in  right ventricular wall thickness. Right ventricular systolic function is  normal.   Left Atrium: Left atrial size was normal in size.   Right Atrium: Right atrial size was normal in size.   Pericardium: There is no evidence of pericardial effusion.   Mitral Valve: The mitral valve is normal in structure. No evidence of  mitral valve regurgitation. No evidence of mitral valve stenosis.   Tricuspid Valve: The tricuspid valve is normal in structure. Tricuspid  valve regurgitation is trivial. No evidence of tricuspid stenosis.   Aortic Valve: The aortic valve was not well visualized.  Aortic valve  regurgitation is not visualized. No aortic stenosis is present.   Pulmonic Valve: The pulmonic valve was normal in structure. Pulmonic valve  regurgitation is not visualized. No evidence of pulmonic stenosis.   Aorta: The aortic root is normal in size and structure.   Venous: The inferior vena cava is normal in size with greater than 50%  respiratory variability, suggesting right atrial pressure of 3 mmHg.   IAS/Shunts: No atrial level shunt detected by color flow Doppler.   Chest CTA (11/21/12): IMPRESSION:  1. Large bilateral central pulmonary emboli. Overall clot burden is severe.  2. No evidence of pulmonary infarction.  3. Small right middle lobe pulmonary nodule. If the patient is at high risk for bronchogenic carcinoma, follow-up chest CT at 1 year is recommended. If the patient is at low risk, no follow-up is needed. This recommendation follows the consensus statement:  Guidelines for Management of Small Pulmonary Nodules Detected on CT Scans: A Statement from the Fleischner Society as published in Radiology 2005; 237:395-400.   Venous Duplex (11/22/12): Summary:  Findings consistent with acute, occluisvedeep vein thrombosis involving the right poplitealvein and the bilateral posterior tibial veins of thelower extremity.    Recent Labs: 05/10/2021: BUN 19; Creatinine, Ser 0.94; Hemoglobin 14.7; Magnesium 2.2; Platelets 293; Potassium 4.6; Sodium 142  Recent Lipid Panel    Component Value Date/Time   CHOL 168 05/10/2021 1007   TRIG 47 05/10/2021 1007   HDL 55 05/10/2021 1007   CHOLHDL 3.1 05/10/2021 1007   CHOLHDL 4.5 07/22/2018 0951   VLDL 7 07/22/2018 0951   LDLCALC 103 (H) 05/10/2021 1007   LDLCALC 83 08/10/2017 1101    Physical Exam:    VS:  BP 138/87    Pulse (!) 116    Ht 5\' 2"  (1.575 m)    Wt (!) 315 lb 3.2 oz (143 kg)    SpO2 98%    BMI 57.65 kg/m     Wt Readings from Last 3 Encounters:  08/17/21 (!) 315 lb 3.2 oz (143 kg)  05/10/21 (!) 314 lb  9.6 oz (142.7 kg)  11/18/19 280 lb (127 kg)     GEN: Well nourished, well developed in no acute distress HEENT: Normal NECK: No JVD; No carotid bruits LYMPHATICS: No lymphadenopathy CARDIAC: S1S2 noted,RRR, no murmurs, rubs, gallops RESPIRATORY:  Clear to auscultation without rales, wheezing or rhonchi  ABDOMEN: Soft, non-tender, non-distended, +bowel sounds, no guarding. EXTREMITIES: No edema, No cyanosis, no clubbing MUSCULOSKELETAL:  No deformity  SKIN: Warm and dry NEUROLOGIC:  Alert and oriented x 3, non-focal PSYCHIATRIC:  Normal affect, good insight  ASSESSMENT:    1. Medication management   2. SOB (shortness of breath)   3.  Morbid obesity (HCC)    PLAN:     We discussed her testing result.  We will keep her currently on the Lasix.  Her shortness of breath still does exist even though it has improved.  I do think this is multifactorial and the fact that she also is morbidly obese, and she does not use her CPAP as prescribed.  She plans to start using her CPAP on a regular basis.  He is looking into weight loss.  For right now her focus is on trying to understand the question about breast mass she has repeat testing for this.  The patient is in agreement with the above plan. The patient left the office in stable condition.  The patient will follow up in 6 months.   Medication Adjustments/Labs and Tests Ordered: Current medicines are reviewed at length with the patient today.  Concerns regarding medicines are outlined above.  Orders Placed This Encounter  Procedures   Basic Metabolic Panel (BMET)   Magnesium   No orders of the defined types were placed in this encounter.   Patient Instructions  Medication Instructions:  Your physician recommends that you continue on your current medications as directed. Please refer to the Current Medication list given to you today.  *If you need a refill on your cardiac medications before your next appointment, please call your  pharmacy*   Lab Work: Your physician recommends that you return for lab work in:  [employer], [place] If you have labs (blood work) drawn today and your tests are completely normal, you will receive your results only by: MyChart Message (if you have MyChart) OR A paper copy in the mail If you have any lab test that is abnormal or we need to change your treatment, we will call you to review the results.   Testing/Procedures: None   Follow-Up: At [facility], you and your health needs are our priority.  As part of our continuing mission to provide you with exceptional heart care, we have created designated Provider Care Teams.  These Care Teams include your primary Cardiologist (physician) and Advanced Practice Providers (APPs -  Physician Assistants and Nurse Practitioners) who all work together to provide you with the care you need, when you need it.  We recommend signing up for the patient portal called "MyChart".  Sign up information is provided on this After Visit Summary.  MyChart is used to connect with patients for Virtual Visits (Telemedicine).  Patients are able to view lab/test results, encounter notes, upcoming appointments, etc.  Non-urgent messages can be sent to your provider as well.   To learn more about what you can do with MyChart, go to [email or web address].    Your next appointment:   6 month(s)  The format for your next appointment:   In Person  Provider:   Thomasene RippleKardie Janesha Brissette, DO     Other Instructions     Adopting a Healthy Lifestyle.  Know what a healthy weight is for you (roughly BMI <25) and aim to maintain this   Aim for 7+ servings of fruits and vegetables daily   65-80+ fluid ounces of water or unsweet tea for healthy kidneys   Limit to max 1 drink of alcohol per day; avoid smoking/tobacco   Limit animal fats in diet for cholesterol and heart health - choose grass fed whenever available   Avoid highly processed foods, and foods high in saturated/trans  fats   Aim for low stress - take time to unwind and care for your  mental health   Aim for 150 min of moderate intensity exercise weekly for heart health, and weights twice weekly for bone health   Aim for 7-9 hours of sleep daily   When it comes to diets, agreement about the perfect plan isnt easy to find, even among the experts. Experts at the [facility] [employer] developed an idea known as the Healthy Eating Plate. Just imagine a plate divided into logical, healthy portions.   The emphasis is on diet quality:   Load up on vegetables and fruits - one-half of your plate: Aim for color and variety, and remember that potatoes dont count.   Go for whole grains - one-quarter of your plate: Whole wheat, barley, wheat berries, quinoa, oats, brown rice, and foods made with them. If you want pasta, go with whole wheat pasta.   Protein power - one-quarter of your plate: Fish, chicken, beans, and nuts are all healthy, versatile protein sources. Limit red meat.   The diet, however, does go beyond the plate, offering a few other suggestions.   Use healthy plant oils, such as olive, canola, soy, corn, sunflower and peanut. Check the labels, and avoid partially hydrogenated oil, which have unhealthy trans fats.   If youre thirsty, drink water. Coffee and tea are good in moderation, but skip sugary drinks and limit milk and dairy products to one or two daily servings.   The type of carbohydrate in the diet is more important than the amount. Some sources of carbohydrates, such as vegetables, fruits, whole grains, and beans-are healthier than others.   Finally, stay active  Signed, Thomasene Ripple, DO  08/18/2021 5:32 PM    [facility]

## 2021-08-17 NOTE — Patient Instructions (Signed)
Medication Instructions:  Your physician recommends that you continue on your current medications as directed. Please refer to the Current Medication list given to you today.  *If you need a refill on your cardiac medications before your next appointment, please call your pharmacy*   Lab Work: Your physician recommends that you return for lab work in:  [employer], [place] If you have labs (blood work) drawn today and your tests are completely normal, you will receive your results only by: MyChart Message (if you have MyChart) OR A paper copy in the mail If you have any lab test that is abnormal or we need to change your treatment, we will call you to review the results.   Testing/Procedures: None   Follow-Up: At [facility], you and your health needs are our priority.  As part of our continuing mission to provide you with exceptional heart care, we have created designated Provider Care Teams.  These Care Teams include your primary Cardiologist (physician) and Advanced Practice Providers (APPs -  Physician Assistants and Nurse Practitioners) who all work together to provide you with the care you need, when you need it.  We recommend signing up for the patient portal called "MyChart".  Sign up information is provided on this After Visit Summary.  MyChart is used to connect with patients for Virtual Visits (Telemedicine).  Patients are able to view lab/test results, encounter notes, upcoming appointments, etc.  Non-urgent messages can be sent to your provider as well.   To learn more about what you can do with MyChart, go to [email or web address].    Your next appointment:   6 month(s)  The format for your next appointment:   In Person  Provider:   Berniece Salines, DO     Other Instructions

## 2021-08-27 ENCOUNTER — Ambulatory Visit
Admission: RE | Admit: 2021-08-27 | Discharge: 2021-08-27 | Disposition: A | Discharge status: Home / Self Care | Payer: Medicare Other | Source: Ambulatory Visit | Attending: Family Medicine | Admitting: Family Medicine

## 2021-08-27 ENCOUNTER — Other Ambulatory Visit: Payer: Self-pay | Admitting: Family Medicine

## 2021-08-27 DIAGNOSIS — R928 Other abnormal and inconclusive findings on diagnostic imaging of breast: Secondary | ICD-10-CM | POA: Diagnosis not present

## 2021-08-27 DIAGNOSIS — R921 Mammographic calcification found on diagnostic imaging of breast: Secondary | ICD-10-CM

## 2021-08-30 DIAGNOSIS — H5203 Hypermetropia, bilateral: Secondary | ICD-10-CM | POA: Diagnosis not present

## 2021-10-15 DIAGNOSIS — H269 Unspecified cataract: Secondary | ICD-10-CM | POA: Diagnosis not present

## 2021-10-15 DIAGNOSIS — H401132 Primary open-angle glaucoma, bilateral, moderate stage: Secondary | ICD-10-CM | POA: Diagnosis not present

## 2021-11-02 DIAGNOSIS — R5383 Other fatigue: Secondary | ICD-10-CM | POA: Diagnosis not present

## 2021-11-02 DIAGNOSIS — I2782 Chronic pulmonary embolism: Secondary | ICD-10-CM | POA: Diagnosis not present

## 2021-11-02 DIAGNOSIS — R600 Localized edema: Secondary | ICD-10-CM | POA: Diagnosis not present

## 2021-11-02 DIAGNOSIS — G4733 Obstructive sleep apnea (adult) (pediatric): Secondary | ICD-10-CM | POA: Diagnosis not present

## 2021-11-02 DIAGNOSIS — D6859 Other primary thrombophilia: Secondary | ICD-10-CM | POA: Diagnosis not present

## 2021-11-02 DIAGNOSIS — G629 Polyneuropathy, unspecified: Secondary | ICD-10-CM | POA: Diagnosis not present

## 2021-11-02 DIAGNOSIS — E785 Hyperlipidemia, unspecified: Secondary | ICD-10-CM | POA: Diagnosis not present

## 2021-11-02 DIAGNOSIS — I5022 Chronic systolic (congestive) heart failure: Secondary | ICD-10-CM | POA: Diagnosis not present

## 2021-11-02 DIAGNOSIS — I739 Peripheral vascular disease, unspecified: Secondary | ICD-10-CM | POA: Diagnosis not present

## 2021-11-02 DIAGNOSIS — E118 Type 2 diabetes mellitus with unspecified complications: Secondary | ICD-10-CM | POA: Diagnosis not present

## 2021-11-02 DIAGNOSIS — E261 Secondary hyperaldosteronism: Secondary | ICD-10-CM | POA: Diagnosis not present

## 2021-11-02 DIAGNOSIS — I509 Heart failure, unspecified: Secondary | ICD-10-CM | POA: Diagnosis not present

## 2021-11-02 DIAGNOSIS — I82401 Acute embolism and thrombosis of unspecified deep veins of right lower extremity: Secondary | ICD-10-CM | POA: Diagnosis not present

## 2021-11-02 DIAGNOSIS — I10 Essential (primary) hypertension: Secondary | ICD-10-CM | POA: Diagnosis not present

## 2021-11-02 DIAGNOSIS — E559 Vitamin D deficiency, unspecified: Secondary | ICD-10-CM | POA: Diagnosis not present

## 2021-11-02 DIAGNOSIS — H409 Unspecified glaucoma: Secondary | ICD-10-CM | POA: Diagnosis not present

## 2021-11-03 DIAGNOSIS — H25813 Combined forms of age-related cataract, bilateral: Secondary | ICD-10-CM | POA: Diagnosis not present

## 2021-11-03 DIAGNOSIS — H401132 Primary open-angle glaucoma, bilateral, moderate stage: Secondary | ICD-10-CM | POA: Diagnosis not present

## 2021-11-05 ENCOUNTER — Ambulatory Visit: Payer: Medicare Other | Admitting: Podiatry

## 2021-11-08 ENCOUNTER — Ambulatory Visit (INDEPENDENT_AMBULATORY_CARE_PROVIDER_SITE_OTHER): Payer: Medicare Other | Admitting: Podiatry

## 2021-11-08 ENCOUNTER — Telehealth: Payer: Self-pay

## 2021-11-08 ENCOUNTER — Telehealth: Payer: Self-pay | Admitting: Podiatry

## 2021-11-08 ENCOUNTER — Encounter: Payer: Self-pay | Admitting: Podiatry

## 2021-11-08 DIAGNOSIS — B353 Tinea pedis: Secondary | ICD-10-CM | POA: Diagnosis not present

## 2021-11-08 DIAGNOSIS — M79674 Pain in right toe(s): Secondary | ICD-10-CM

## 2021-11-08 DIAGNOSIS — M79675 Pain in left toe(s): Secondary | ICD-10-CM | POA: Diagnosis not present

## 2021-11-08 DIAGNOSIS — B351 Tinea unguium: Secondary | ICD-10-CM | POA: Diagnosis not present

## 2021-11-08 NOTE — Telephone Encounter (Signed)
Called and spoke with pt. Advised her last visit with [place] was 05/05/2016. She will need to contact PCP about getting referral placed to our office. She will need appt first before we can proceed. She verbalized understanding and will contact PCP. ?

## 2021-11-08 NOTE — Telephone Encounter (Signed)
Patient called to day and was needing to speak with RN about adjusting her pressure on her cpap machine. Pt requests a call back by 11 am if possible ?

## 2021-11-08 NOTE — Telephone Encounter (Signed)
Pt called in to give update of medications.  ? ?Jardiance 10mg  1x aday  ?Dorsal/ Timolol Eye drops 1 drop 2x aday ?Brimonidine eye drops 1 drop each eye ?

## 2021-11-14 NOTE — Progress Notes (Signed)
  Subjective:  Patient ID: Kerry Liu, female    DOB: [birth year],  MRN: [id number]  Kerry Liu presents to clinic today for painful thick toenails that are difficult to trim. Pain interferes with ambulation. Aggravating factors include wearing enclosed shoe gear. Pain is relieved with periodic professional debridement.  Patient states blood glucose was 147 mg/dl today.  Last known HgA1c was 7.5%.  New problem(s): None.   Patient states the pharmacy gave her three tubes of Clotrimazole Cream on her last visit.  PCP is Margot Ables, MD (Inactive) , and last visit was last week.  Allergies  Allergen Reactions   Codeine Itching   Hydrocodone Itching   Oxycodone Rash   Tramadol Rash    Review of Systems: Negative except as noted in the HPI.  Objective: No changes noted in today's physical examination.  Vascular Examination: Palpable pedal pulses b/l LE. Digital hair present b/l. No pedal edema b/l. Skin temperature gradient WNL b/l. No varicosities b/l. CFT <3 seconds b/l LE.Marland Kitchen  Dermatological Examination: Pedal skin with normal turgor, texture and tone b/l. No open wounds. No interdigital macerations b/l. Toenails 1-5 b/l thickened, discolored, dystrophic with subungual debris. There is pain on palpation to dorsal aspect of nailplates.  Residual diffuse scaling remains on periphery of both feet. No blistering, no weeping, no breaks in skin.Marland Kitchen  Neurological Examination: Protective sensation intact with 10 gram monofilament b/l LE. Vibratory sensation intact b/l LE.   Musculoskeletal Examination: Muscle strength 5/5 to all LE muscle groups b/l. No pain, crepitus or joint limitation noted with ROM bilateral LE.  Assessment/Plan: 1. Pain due to onychomycosis of toenails of both feet   2. Tinea pedis of both feet     -Patient was evaluated and treated. All patient's and/or POA's questions/concerns answered on today's visit. -She has additional tubes of Clotrimazole  Cream 1%. Patient instructed to apply to both feet and between toes bid x 6 weeks.. -Patient to continue soft, supportive shoe gear daily. -Patient instructed to spray shoes with Lysol disinfectant every evening. -Toenails 1-5 b/l were debrided in length and girth with sterile nail nippers and dremel without iatrogenic bleeding.  -Patient/POA to call should there be question/concern in the interim.   Return in about 3 months (around 02/08/2022).  Freddie Breech, DPM

## 2021-11-17 DIAGNOSIS — H401132 Primary open-angle glaucoma, bilateral, moderate stage: Secondary | ICD-10-CM | POA: Diagnosis not present

## 2021-11-17 DIAGNOSIS — Z01818 Encounter for other preprocedural examination: Secondary | ICD-10-CM | POA: Diagnosis not present

## 2021-11-17 DIAGNOSIS — H25811 Combined forms of age-related cataract, right eye: Secondary | ICD-10-CM | POA: Diagnosis not present

## 2022-01-06 ENCOUNTER — Encounter: Payer: Self-pay | Admitting: Neurology

## 2022-01-06 ENCOUNTER — Ambulatory Visit (INDEPENDENT_AMBULATORY_CARE_PROVIDER_SITE_OTHER): Payer: Medicare Other | Admitting: Neurology

## 2022-01-06 VITALS — BP 160/87 | HR 78 | Ht 62.0 in | Wt 313.0 lb

## 2022-01-06 DIAGNOSIS — Z9189 Other specified personal risk factors, not elsewhere classified: Secondary | ICD-10-CM | POA: Diagnosis not present

## 2022-01-06 DIAGNOSIS — Z86711 Personal history of pulmonary embolism: Secondary | ICD-10-CM

## 2022-01-06 DIAGNOSIS — E66813 Obesity, class 3: Secondary | ICD-10-CM | POA: Insufficient documentation

## 2022-01-06 DIAGNOSIS — Z6841 Body Mass Index (BMI) 40.0 and over, adult: Secondary | ICD-10-CM

## 2022-01-06 DIAGNOSIS — R0601 Orthopnea: Secondary | ICD-10-CM | POA: Diagnosis not present

## 2022-01-06 NOTE — Progress Notes (Signed)
SLEEP MEDICINE CLINIC    Provider:  Larey Seat, MD  Primary Care Physician:  Buzzy Han, MD (Inactive) No address on file     Referring Provider: Margretta Sidle, [address] [place],  [place] [id number]          Chief Complaint according to patient   Patient presents with:     New Patient (Initial Visit)     Pt stopped using CPAP about a year ago.  Has been non compliant, not followed here since 2018. Machine is from 2017.  Was using it always only off/on. Pt brought in her Resmed CPAP and no recent data found. DME:[facility]      HISTORY OF PRESENT ILLNESS:  Kerry Liu is a 71 y.o. African American female patient seen here as a referral on 01/06/2022 from new PCP for a sleep evaluation..  Chief concern according to patient :  Kerry Liu, a 71 year old African-American female patient is seen here today after a 5 years hiatus, stating that her CPAP machine which is close to 69 years old is no longer working for her.  She had to stop working with her CPAP and she could not get new supplies either.  I suspect that this is a software issue.  Since her last baseline study which I will "below in summer 2017 there has been some weight loss, and some changes in her medical history including a diabetes diagnosis.  In summer 2017 the patient had a history of right bundle branch block, sinus tachycardia, a pulmonary embolism associated with shortness of breath and pulmonary hypertension, hyperlipidemia and degenerative joint disease osteoarthritis.  At baseline study in 2017 documented mild apnea her AHI was 8.6 but she was excessively daytime sleepy and endorsed the Epworth Sleepiness Scale at 16 out of 24 points.  BMI at the time was 59.4 and neck circumference 17 inches.   Since  she was titrated to CPAP at 7 cm water so she really needed more hypoventilation support than true apnea support.   Oxygen nadir was 89% on CPAP so there was no hypoxia noted.  She was  prescribed an AirFit P10 medium size which is a nasal pillow.    She reports at the time followed by Dr. Melford Aase and by advanced home care.  She now returns after having switched her care to [place] Dr. Reece Levy [address] [facility]. here in [place] for a new evaluation.  She had presented with shortness of breath for the last 2 months increasing shortness of breath sometimes with wheezing and she cannot sleep flat and needs to be elevated.  She is using Lasix once a day which has reduced her fluid overload and her ankle edema.  She also has a history of glaucoma and cataracts, and a new diabetic diagnosis her H1C was 7.5%.  Medication list is quoted below.  Last echocardiogram was in January 2020 and showed a 50% ejection fraction left ventricular function was deemed normal.   She was placed on a restricted salt diet, she continued to take Xarelto after her pulmonary embolism pulmonary care was provided through [facility] pulmonology.  Cardiology had also looked at her CPAP use and stated that she had not been compliant recently.  I  01-06-2022:  looked at her labs it states that she had good cholesterol control, HDL cholesterol is low which would be the good cholesterol and is dependent on physical exercise she does have low triglycerides as well, her fasting glucose was 283  so above normal, creatinine was 0.97 which is excellent so there is no renal function problem.  She does have slightly lower protein content than desired, her ALT liver function enzymes has been above normal at 45.  AST is normal, the last hemoglobin A1c quoted in her referral map from May 23 of this year was 8.8.  Vitamin D is low.  The patient is not anemic.  So     I have the pleasure of seeing Kerry Liu today, a right -handed Black or [place] American female with a possible sleep disorder.  She  has a past medical history of Acute meniscal tear of knee, Arthritis, Elevated hemoglobin A1c, Exertional shortness of  breath, History of DVT (deep vein thrombosis), HTN (hypertension), Mixed hyperlipidemia, Obesity, Pulmonary embolism ([place]), RBBB (right bundle branch block with left anterior fascicular block), Seasonal allergies, Sinus tachycardia, Sleep apnea, and Vitamin D deficiency.  Sleep relevant medical history: Nocturia every 2 hours, cervical spine DDD, nasal congestion, sinusitis,  deviated septum , poor dentition.     Family medical /sleep history: No other family member on CPAP with OSA, insomnia, sleep walkers.    Social history:  Patient is retired from [employer] 2019.  and lives in a household alone. Family status is single , with one adult child, 5 grand children. Pets are not present. Tobacco ZWC:HENI.  ETOH use ; none ,  Caffeine intake in form of Coffee( /) Soda( quit recently 8 weeks ago after dx of DM) Tea ( /) or energy drinks. Regular exercise: .   Hobbies :none       Sleep habits are as follows: The patient's dinner time is between 5-6 PM. The patient goes to bed at 69-11 PM , bedroom is cool, and quiet, and dark- and continues to sleep with interruption for 6 hours, wakes for 2-3 bathroom breaks. The preferred sleep position is elevated- orthopnea- , with the support of 3-5 pillows.  Dreams are reportedly infrequent/vivid. She is sleep talking.  10-11  AM is the usual rise time. The patient wakes up spontaneously at 8.  She reports not feeling refreshed or restored in AM, with symptoms such as dry mouth, morning headaches, and residual fatigue. Naps are taken frequently, lasting from 60 to 180 minutes and are refreshing .    Review of Systems: Out of a complete 14 system review, the patient complains of only the following symptoms, and all other reviewed systems are negative.:  Fatigue, sleepiness , snoring, fragmented sleep, Insomnia - delayed sleep time    How likely are you to doze in the following situations: 0 = not likely, 1 = slight chance, 2 = moderate  chance, 3 = high chance   Sitting and Reading? Watching Television? Sitting inactive in a public place (theater or meeting)? As a passenger in a car for an hour without a break? Lying down in the afternoon when circumstances permit? Sitting and talking to someone? Sitting quietly after lunch without alcohol? In a car, while stopped for a few minutes in traffic?   Total = 14/ 24 points   FSS endorsed at 45/ 63 points.   GDS- depression score 2/ 15   Social History   Socioeconomic History   Marital status: Single    Spouse name: Not on file   Number of children: 1   Years of education: Not on file   Highest education level: High school graduate  Occupational History   Occupation: housekeeper    Employer: [employer]  Tobacco Use   Smoking status: Never   Smokeless tobacco: Never  Substance and Sexual Activity   Alcohol use: No   Drug use: No   Sexual activity: Never  Other Topics Concern   Not on file  Social History Narrative   Lives alone   R handed   Caffeine: rare   Social Determinants of Health   Financial Resource Strain: Not on file  Food Insecurity: Not on file  Transportation Needs: Not on file  Physical Activity: Not on file  Stress: Not on file  Social Connections: Not on file   Lost a grandson, cerebral aneurysm bleed Family History  Problem Relation Age of Onset   Heart attack Father    Hypertension Brother    Diabetes Brother    Stroke Brother    Sudden death Brother    Heart attack Other    Breast cancer Neg Hx     Past Medical History:  Diagnosis Date   Acute meniscal tear of knee    RIGHT   Arthritis    "right knee" (11/21/2012)   Elevated hemoglobin A1c    Exertional shortness of breath    "this week" (11/21/2012)   History of DVT (deep vein thrombosis)    IN PREGNANCY   HTN (hypertension)    CARDIOLOGIST--  DR HOCHREIN-- CLEARANCE NOTE IN EPIC AND CHART FROM 08-21-2012   Mixed hyperlipidemia    Obesity    Pulmonary  embolism ([place])    "3 today" (11/21/2012)   RBBB (right bundle branch block with left anterior fascicular block)    Seasonal allergies    Sinus tachycardia    Sleep apnea    Vitamin D deficiency     Past Surgical History:  Procedure Laterality Date   IVC filter placed in May 2014 N/A    KNEE ARTHROSCOPY Right 12-09-2002   KNEE ARTHROSCOPY WITH MEDIAL MENISECTOMY Right 10/05/2012   Procedure: KNEE ARTHROSCOPY WITH MEDIAL MENISECTOMY;  Surgeon: Tobi Bastos, MD;  Location: [facility];  Service: Orthopedics;  Laterality: Right;   TOTAL ABDOMINAL HYSTERECTOMY W/ BILATERAL SALPINGOOPHORECTOMY  ~ 1986   W/ BILATERAL SALPINGOOPHORECTOMY   TRANSTHORACIC ECHOCARDIOGRAM  11-17-2010   MILD LVH/ EF 40-97%/ GRADE I DIASTOLIC DYSFUNCTION/ MILD LAE / MILD RAE     Current Outpatient Medications on File Prior to Visit  Medication Sig Dispense Refill   albuterol (PROVENTIL) (2.5 MG/3ML) 0.083% nebulizer solution Take 3 mLs (2.5 mg total) by nebulization every 6 (six) hours as needed for wheezing or shortness of breath. 75 mL 12   atorvastatin (LIPITOR) 80 MG tablet Take 0.5 tablets (40 mg total) by mouth every evening. 30 tablet 0   Blood Glucose Monitoring Suppl (ONETOUCH VERIO REFLECT) w/Device KIT USE METER AS DIRECTED ONCE DAILY TO CHECK FINGERSTICK GLUCOSE     brimonidine (ALPHAGAN) 0.2 % ophthalmic solution 1 drop 3 (three) times daily.     Cholecalciferol 25 MCG (1000 UT) capsule Take 2 capsules (2,000 Units total) by mouth daily. 60 capsule 2   ciclopirox (PENLAC) 8 % solution Apply topically at bedtime. Apply over nail and surrounding skin. Apply daily over previous coat. After seven (7) days, may remove with alcohol and continue cycle. 6.6 mL 4   clotrimazole (LOTRIMIN) 1 % cream Apply to affected feet and between toes twice daily for 6 weeks for athlete's feet 60 g 1   dorzolamide-timolol (COSOPT) 22.3-6.8 MG/ML ophthalmic solution 1 drop 2 (two) times daily.     JARDIANCE 10  MG TABS  tablet Take 10 mg by mouth daily.     Lancets (ONETOUCH DELICA PLUS [id number]) MISC SMARTSIG:Via Meter     losartan (COZAAR) 50 MG tablet Take 1 tablet (50 mg total) by mouth daily. 90 tablet 0   metoprolol tartrate (LOPRESSOR) 50 MG tablet Take 1 tablet (50 mg total) by mouth 2 (two) times daily. 90 tablet 1   ONETOUCH VERIO test strip daily.     rivaroxaban (XARELTO) 20 MG TABS tablet Take 1 tablet (20 mg total) by mouth daily with supper. 30 tablet 6   Vitamin D, Ergocalciferol, (DRISDOL) 1.25 MG (50000 UNIT) CAPS capsule Take 1 capsule (50,000 Units total) by mouth every 7 (seven) days. 12 capsule 0   amLODipine (NORVASC) 10 MG tablet Take 1 tablet (10 mg total) by mouth daily. 90 tablet 0   furosemide (LASIX) 40 MG tablet Take 1 tablet (40 mg total) by mouth daily. 90 tablet 3   potassium chloride SA (KLOR-CON M20) 20 MEQ tablet Take 1 tablet (20 mEq total) by mouth daily. 90 tablet 3   No current facility-administered medications on file prior to visit.    Allergies  Allergen Reactions   Codeine Itching   Hydrocodone Itching   Oxycodone Rash   Tramadol Rash    Physical exam:  Today's Vitals   01/06/22 0852  BP: (!) 160/87  Pulse: 78  Weight: (!) 313 lb (142 kg)  Height: $Remove'5\' 2"'jHxXLXv$  (1.575 m)   Body mass index is 57.25 kg/m.   Wt Readings from Last 3 Encounters:  01/06/22 (!) 313 lb (142 kg)  08/17/21 (!) 315 lb 3.2 oz (143 kg)  05/10/21 (!) 314 lb 9.6 oz (142.7 kg)     Ht Readings from Last 3 Encounters:  01/06/22 $RemoveB'5\' 2"'ZPXNlxlr$  (1.575 m)  08/17/21 $RemoveB'5\' 2"'bHSQguIr$  (1.575 m)  05/10/21 $RemoveB'5\' 3"'KkzcISGD$  (1.6 m)      General: The patient is awake, alert and appears not in acute distress. The patient is well groomed. Head: Normocephalic, atraumatic. Neck is supple. Mallampati 3 plus ,  neck circumference:18 inches . Nasal airflow barely patent.  Retrognathia is  seen.  Dental status: poor,  Cardiovascular:  Regular rate and cardiac rhythm by pulse,  without distended neck veins. Respiratory:  Lungs are clear to auscultation.  Skin:  With evidence of ankle edema, Trunk: The patient's posture is erect.   Neurologic exam : The patient is awake and alert, oriented to place and time.   Memory subjective described as intact.  Attention span & concentration ability appears normal.  Speech is fluent,  with  dysarthria, dysphonia . Mood and affect are appropriate.   Cranial nerves: no loss of smell or taste reported  Pupils are equal and briskly reactive to light. Funduscopic exam deferred..  Extraocular movements in vertical and horizontal planes were intact and without nystagmus. No Diplopia. Visual fields by finger perimetry are intact. Hearing was intact to soft voice and finger rubbing.  Facial sensation intact to fine touch.  Facial motor strength is symmetric and tongue and uvula move midline.  Neck ROM : rotation, tilt and flexion extension were normal for age and shoulder shrug was symmetrical.    Motor exam:  Symmetric bulk, tone and ROM.   Normal tone without cog wheeling, symmetrically weak grip strength .   Sensory:  Fine touch and vibration were not felt at either ankle ( there is edema too) Proprioception tested in the upper extremities was normal.   Coordination: Rapid alternating movements in the fingers/hands were of  normal speed.  The Finger-to-nose maneuver was intact without evidence of ataxia, dysmetria or tremor.   Gait and station: Patient could rise with difficulties from a seated position, walked stooped, no assistive device. At home,she uses a walker or cane Toe and heel walk were deferred.  Deep tendon reflexes: in the  upper and lower extremities are symmetric and intact.  Babinski response was deferred .        After spending a total time of  45  minutes face to face and additional time for physical and neurologic examination, review of laboratory studies,  personal review of imaging studies, reports and results of other testing and review of  referral information / records as far as provided in visit, I have established the following assessments:  1) services partly his health history has changed also her body mass index, and she has not reap the benefits of positive airway pressure therapy for quite a while.  We need to establish a new baseline and I am sure that we need to restart positive airway pressure therapy afterwards.  I prefer for this patient to come into the sleep lab if possible if her insurance denies an in lab or split-night titration I would use a home sleep test to screen her for apnea.       My Plan is to proceed with:  1)My concern is that she was more of a obesity hypoventilation patient rather than a clear obstructive sleep apnea patient.  She also has risk factors for central sleep apnea or Cheyne-Stokes respirations that can be addressed in an in lab setting.    So I will ask her insurance carrier to allow me to bring her into the sleep lab if her Medicare contract allows a split after an AHI of 10 I will do that.  Please note that the patient is orthopneic coming is still morbidly obese, and now has a diagnosis of diabetes mellitus.    I would like to thank  Margretta Sidle, [address],  [place] [address] for allowing me to meet with and to take care of this pleasant patient.   In short, Kerry Liu is presenting with untreated hypoventilation, orthopnea, history of PE, morbid obesity and newly diagnosed DM>   I plan to follow up either personally or through our NP within 2-3 months.    Electronically signed by: Larey Seat, MD 01/06/2022 9:08 AM  [facility] Board certified by The [employer] of Sleep Medicine and Diplomate of the [employer] of Sleep Medicine. Board certified In Neurology through the [place], Fellow of the [employer] of Neurology. Medical Director of [facility].

## 2022-01-17 ENCOUNTER — Telehealth: Payer: Self-pay

## 2022-01-17 NOTE — Telephone Encounter (Signed)
LVM for pt to call me back to schedule sleep study  

## 2022-01-25 NOTE — Telephone Encounter (Signed)
Split- UHC medicare/BCBS state/Medicaid no auth req.  Patient is scheduled at [facility] for 02/14/22 at 8 pm  Mailed packet to the patient.

## 2022-02-14 ENCOUNTER — Ambulatory Visit (INDEPENDENT_AMBULATORY_CARE_PROVIDER_SITE_OTHER): Payer: Medicare Other | Admitting: Neurology

## 2022-02-14 ENCOUNTER — Ambulatory Visit (INDEPENDENT_AMBULATORY_CARE_PROVIDER_SITE_OTHER): Payer: Medicare Other | Admitting: Podiatry

## 2022-02-14 ENCOUNTER — Encounter: Payer: Self-pay | Admitting: Podiatry

## 2022-02-14 DIAGNOSIS — G4733 Obstructive sleep apnea (adult) (pediatric): Secondary | ICD-10-CM

## 2022-02-14 DIAGNOSIS — B353 Tinea pedis: Secondary | ICD-10-CM

## 2022-02-14 DIAGNOSIS — Z86711 Personal history of pulmonary embolism: Secondary | ICD-10-CM

## 2022-02-14 DIAGNOSIS — B351 Tinea unguium: Secondary | ICD-10-CM | POA: Diagnosis not present

## 2022-02-14 DIAGNOSIS — Z9189 Other specified personal risk factors, not elsewhere classified: Secondary | ICD-10-CM

## 2022-02-14 DIAGNOSIS — M79675 Pain in left toe(s): Secondary | ICD-10-CM | POA: Diagnosis not present

## 2022-02-14 DIAGNOSIS — R0601 Orthopnea: Secondary | ICD-10-CM

## 2022-02-14 DIAGNOSIS — M79674 Pain in right toe(s): Secondary | ICD-10-CM | POA: Diagnosis not present

## 2022-02-14 NOTE — Patient Instructions (Signed)
Purchase a new pair of Skechers  For athlete's feet, apply Clotrimazole Cream to both feet and between toes twice daily for 6 weeks.  To prevent reinfection, spray shoes with lysol every evening.  Clean tub or shower with bleach based cleanser.   Athlete's Foot Athlete's foot (tinea pedis) is a fungal infection of the skin on your feet. It often occurs on the skin that is between or underneath the toes. It can also occur on the soles of your feet. The infection can spread from person to person (is contagious). It can also spread when a person's bare feet come in contact with the fungus on shower floors or on items such as shoes. What are the causes? This condition is caused by a fungus that grows in warm, moist places. You can get athlete's foot by sharing shoes, shower stalls, towels, and wet floors with someone who is infected. Not washing your feet or changing your socks often enough can also lead to athlete's foot. What increases the risk? This condition is more likely to develop in: Men. People who have a weak body defense system (immune system). People who have diabetes. People who use public showers, such as at a gym. People who wear heavy-duty shoes, such as Youth worker. Seasons with warm, humid weather. What are the signs or symptoms? Symptoms of this condition include: Itchy areas between your toes or on the soles of your feet. White, flaky, or scaly areas between your toes or on the soles of your feet. Very itchy small blisters between your toes or on the soles of your feet. Small cuts in your skin. These cuts can become infected. Thick or discolored toenails. How is this diagnosed? This condition may be diagnosed with a physical exam and a review of your medical history. Your health care provider may also take a skin or toenail sample to examine under a microscope. How is this treated? This condition is treated with antifungal medicines. These may be applied  as powders, ointments, or creams. In severe cases, an oral antifungal medicine may be given. Follow these instructions at home: Medicines Apply or take over-the-counter and prescription medicines only as told by your health care provider. Apply your antifungal medicine as told by your health care provider. Do not stop using the antifungal even if your condition improves. Foot care Do not scratch your feet. Keep your feet dry: Wear cotton or wool socks. Change your socks every day or if they become wet. Wear shoes that allow air to flow, such as sandals or canvas tennis shoes. Wash and dry your feet, including the area between your toes. Also, wash and dry your feet: Every day or as told by your health care provider. After exercising. General instructions Do not let others use towels, shoes, nail clippers, or other personal items that touch your feet. Protect your feet by wearing sandals in wet areas, such as locker rooms and shared showers. Keep all follow-up visits. This is important. If you have diabetes, keep your blood sugar under control. Contact a health care provider if: You have a fever. You have swelling, soreness, warmth, or redness in your foot. Your feet are not getting better with treatment. Your symptoms get worse. You have new symptoms. You have severe pain. Summary Athlete's foot (tinea pedis) is a fungal infection of the skin on your feet. It often occurs on skin that is between or underneath the toes. This condition is caused by a fungus that grows in warm, moist  places. Symptoms include white, flaky, or scaly areas between your toes or on the soles of your feet. This condition is treated with antifungal medicines. Keep your feet clean. Always dry them thoroughly. This information is not intended to replace advice given to you by your health care provider. Make sure you discuss any questions you have with your health care provider. Document Revised: 10/04/2020  Document Reviewed: 10/04/2020 Elsevier Patient Education  2023 [employer] Inc.   Moisturizing Feet:  Try Vaseline Intensive Care Lotion once daily first.  For extremely dry, cracked feet: moisturize feet once daily; do not apply between toes A. CeraVe Healing Ointment B. Eucerin Aquaphor Repairing Ointment (may be labeled Aquaphor Healing Ointment) C. Vaseline Petroleum Healing Jelly   If you have problems reaching your feet: apply to feet once daily; do not apply between toes A.  Eucerin Aquaphor Ointment Body Spray  B.  Vaseline Intensive Care Spray Moisturizer (Unscented,  Cocoa Radiant Spray or Aloe Smooth Spray)

## 2022-02-20 NOTE — Progress Notes (Signed)
  Subjective:  Patient ID: Kerry Liu, female    DOB: 12/14/1950,  MRN: [id number]  Kerry Liu presents to clinic today for preventative diabetic foot care and painful elongated mycotic toenails 1-5 bilaterally which are tender when wearing enclosed shoe gear. Pain is relieved with periodic professional debridement.   New problem(s): None.   PCP is Margot Ables, MD (Inactive) , and last visit was June, 2023.  Allergies  Allergen Reactions   Codeine Itching   Hydrocodone Itching   Oxycodone Rash   Tramadol Rash    Review of Systems: Negative except as noted in the HPI. Objective:   Constitutional Kerry Liu is a pleasant 71 y.o. African American female, morbidly obese in NAD. AAO x 3.   Vascular CFT <3 seconds b/l LE. Palpable DP pulse(s) b/l LE. Palpable PT pulse(s) b/l LE. Pedal hair present. No pain with calf compression b/l. Lower extremity skin temperature gradient within normal limits. No edema noted b/l LE. No cyanosis or clubbing noted b/l LE.  Neurologic Normal speech. Oriented to person, place, and time. Protective sensation intact 5/5 intact bilaterally with 10g monofilament b/l. Vibratory sensation intact b/l.  Dermatologic Pedal skin is warm and supple b/l LE. No open wounds b/l LE. No interdigital macerations noted b/l LE. Toenails 1-5 bilaterally elongated, discolored, dystrophic, thickened, and crumbly with subungual debris and tenderness to dorsal palpation. No hyperkeratotic nor porokeratotic lesions present on today's visit. Diffuse scaling noted peripherally and plantarly b/l feet.  No interdigital macerations.  No blisters, no weeping. No signs of secondary bacterial infection noted.  Orthopedic: Normal muscle strength 5/5 to all lower extremity muscle groups bilaterally. No pain, crepitus or joint limitation noted with ROM b/l LE. No gross bony pedal deformities b/l. Patient ambulates independently without assistive aids.   Radiographs:  None Assessment:   1. Pain due to onychomycosis of toenails of both feet   2. Tinea pedis of both feet    Plan:  Patient was evaluated and treated and all questions answered. Consent given for treatment as described below: -Examined patient. -Patient advised to purchase a new pair of shoes as her current pair is worn. She related understanding. -Patient to continue soft, supportive shoe gear daily. -Mycotic toenails 1-5 bilaterally were debrided in length and girth with sterile nail nippers and dremel without incident. -For dry skin, patient was given written list of OTC moisturizers. Patient/POA instructed to apply to foot/feet once daily avoiding application between toes.  -To prevent re-infection of tinea pedis, patient/POA/caregiver instructed to spray shoes with Lysol every evening. -For tinea pedis, Rx sent to pharmacy for Clotrimazole Cream 1% to be applied twice daily for six weeks. -Patient/POA to call should there be question/concern in the interim.  Return in about 3 months (around 05/17/2022).  Freddie Breech, DPM

## 2022-03-06 NOTE — Progress Notes (Signed)
IMPRESSION: Moderate Sleep apnea/ Hypopnea with an AHI of 18.10/h responded to CPAP 11 cm H20, 3 cm H20EPR, butwasstill associated withfrequentoxyhemoglobin desaturation during REM sleep. This study may underestimate the severity apneas, as REM sleep wasvery shortly seenprior to the initiation of PAP therapy.  The patient remained with REM sleep dependent hypoxia/ hypoventilation. Snoring was absent.  RECOMMENDATIONS: This patient did very well on CPAP of 11 cm water , heated humidification and 3 cm water EPR while using an elevated head of bed and 2 pillows- but her last 50 minutes of sleep were showing oxygen desaturation associated with the dominant REM sleep at that time.nasal pillows , ResMed P 10 in medium were used. I will order an new auto CPAP for her, 6 through 15 cm water, 3 cm EPR and nasal pillow in medium. Hated humidification. .    Caveat: REM sleep dependent hypoxia/ hypoventilation may require return for an oxygen titration in the near future.

## 2022-03-06 NOTE — Procedures (Signed)
[facility] SPLIT NIGHT INTERPRETATION REPORT   STUDY DATE: 02/14/2022      PATIENT NAME:  Kerry Liu         DATE OF BIRTH:  Mar 19, 1951  PATIENT ID:  [id number]    TYPE OF STUDY:  SPLIT protocol by AASM guidelines.  READING PHYSICIAN: Melvyn Novas, MD REFERRING: Lula Olszewski, MD, [facility], [place] SCORING TECHNICIAN: Margaretann Loveless, RPSGT   HISTORY: 01-06-2022: Kerry Liu is a 71 year-old Female patient of Dr Reddy's who no longer has a PCP. She was last seen in 2017/ 2018 she had bought her machine- meanwhile discontinued using her CPAP . DME was [facility],  and was not able to get supplies.   in the meantime, there has been weight loss, a new diagnosis of DM, and pulmonary embolism/  further DVT, RBBB and SOB, Nocturia.  She had originally in 2017 presented with EDS ( Epworth score of 16/24) - Her 2017 PSG documented a low AHI of 8.6/h, mild OSA with a BMI of 59.4 kg/m2, neck size 17.25 ".  Her sleep disorder was hypopnea, she needed a lowish pressure of 7 cm CPAP to help with that. No hypoxia was noted,  an air-fit P 10 was used.  The Epworth Sleepiness Scale was endorsed at 14 out of 24 points (scores above or equal to 10 are suggestive of hypersomnolence). FSS at 45/ 63 points, and GDS at 2/ 15 points.  ADDITIONAL INFORMATION:  Height: 62.0 in Weight: 313 lb (BMI 57) Neck Size: 18.0 in    MEDICATIONS: Proventil, Lipitor, Alphagan, Cholecalciferol, Penlac, Lotrimin, Cosopt, Jardiance, Lopressor, Xarelto, Vitamin D, Norvasc, Lasix, Klor - Con  DESCRIPTION: A registered sleep technologist  (RPSGT) was in attendance for the duration of the recording.  Data collection, scoring, video monitoring, and reporting were performed in compliance with the AASM Manual for the Scoring of Sleep and Associated Events; (Hypopnea is scored based on the criteria listed in Section VIII D. 1b in the AASM Manual V2.6 using a 4% oxygen desaturation rule or Hypopnea  is scored based on the criteria listed in Section VIII D. 1a in the AASM Manual V2.6 using 3% oxygen desaturation and /or arousal rule).  A physician certified by the American Board of Sleep Medicine reviewed each epoch of the study.   STUDY DETAILS: Lights off was at 21:10: and lights on 05:37: (507 minutes hours in bed). This study was performed with an initial diagnostic portion followed by positive airway pressure titration.  DIAGNOSTIC ANALYSIS   SLEEP CONTINUITY AND SLEEP ARCHITECTURE:  The diagnostic portion of the study began at 21:10 and ended at 00:18, for a recording time of 3h 8.26m minutes(= 188 minutes ).  Total sleep time was 126 minutes minutes (83.3% supine;  16.7% lateral;  0.0% prone and  with  21.8% REM sleep), with a decreased sleep efficiency at 67.0%.  Sleep latency was normal at 34.5 minutes. REM sleep latency was decreased at 21.5 minutes.  Arousal index was 18.6 /hr.  Of the total sleep time, the percentage of stage N1 sleep was 3.6%, stage N2 sleep was 69.8%, stage N3 sleep was 4.8%, and REM sleep was 21.8%. Wake after sleep onset (WASO) time accounted for 27 minutes. There were 1 Stage R periods observed during this portion of the study, 9 awakenings (i.e. transitions to Stage W from any sleep stage), and 25.0 total stage transitions.    BODY POSITION: Duration of total sleep and percent of total sleep in  their respective position is as follows: supine 105 minutes minutes (83.3%), non-supine 21.0 minutes (16.7%); right 21 minutes minutes (16.7%), left 00 minutes minutes (0.0%), and prone 00 minutes minutes (0.0%). Total supine REM sleep time was 27 minutes minutes (100.0% of total REM sleep).   RESPIRATORY MONITORING:  Based on CMS criteria (using a 4% oxygen desaturation rule for scoring hypopneas), there were 0 apneas (0 obstructive; 0 central; 0 mixed), and 54 hypopneas.  Apnea index was 0.0. Hypopnea index was 17.6. The AHI ( apnea-hypopnea index) was 18.3/h overall ( AHI  was 17.6/h in  supine; versus 0.0/h in non-supine sleep) with a REM AHI of  24.0/h and NREM AHI of  , all supine REM). There were 0 respiratory effort-related arousals (RERAs).    OXIMETRY: Total sleep time spent at, or below 88% was 17.0 minutes, or 13.5% of total sleep time. Oxyhemoglobin desaturations reached a critical nadir of 78%, down  from a normal baseline saturation with a mean 02 at 94%.  Total time spent at, or below 88% was 18.2 minutes, or 14.5%  of total sleep time. Snoring was absent.    LIMB MOVEMENTS: There were 0 periodic limb movements of sleep (0.0/h), of which 0 (0.0/h) were associated with an arousal. AROUSAL (Baseline): There were 33.0 arousals in total, for an arousal index of 15.7 /hour.  Of these, 10.0 were identified as respiratory-related arousals (4.8 /h), 0 were PLM-related arousals (0.0 /h), and 29 were non-specific arousals (13.8 /h).   EKG: Analysis of electrocardiogram activity showed the NSR.  The average heart rate during sleep was 72 bpm, with a variation of heart rate between 58 bpm and highest heart rate of 93 bpm.    TREATMENT on CPAP/  SPLIT initiated following the AASM guidelines:  SLEEP CONTINUITY AND SLEEP ARCHITECTURE:   The technologist noted the patient slept on 2 pillows with an elevated head of bed:  CPAP was initiated under a nasal Pillow as used in 2017- starting at 6 cm CPAP pressure , no EPR and explored to a final of 12 cm water with 3 cm EPR. During the last hour of the study the patient stayed prolonged in REM sleep with oxygen saturation dropping.   The treatment portion of the study began at 00:18 and ended at 05:37, for a recording time of 5 h 19 minutes (319 minutes).  Total sleep time was 178 minutes , divided into : 0.0% supine;  100.0% lateral; 0.0% prone, 38.2% REM sleep, with a decreased sleep efficiency at 55.7%. Wake after sleep onset (WASO) time accounted for 125 minutes. Sleep latency was brief at 16.5 minutes. REM sleep latency was  brief at 34.5 minutes.  Arousal index was 5.7 /h. Of the total sleep time, the percentage of stage N1 sleep was 8.4%, stage N3 sleep was 0.0%, and REM sleep was 38.2%. There were 2 Stage R periods observed during this portion of the study, 8 awakenings (i.e. transitions to Stage W from any sleep stage), and 29.0 total stage transitions.    BODY POSITION: The technologist noted the patient slept on 2 pillows with an elevated head of bed:  Duration of total sleep and percent of total sleep in their respective position is as follows: supine 00 minutes minutes (0.0%), non-supine 178.0 minutes (100.0%); right 05 minutes minutes (3.1%), left 172 minutes minutes (96.9%), and prone 00 minutes minutes (0.0%).  Total supine REM sleep time was 00 minutes minutes (0.0% of total REM sleep).   RESPIRATORY MONITORING:  While on  PAP therapy, based on CMS criteria, the AHI ( total  apnea-hypopnea index) was 5.7 overall ( 3.4/h  REM AHI). Nasal Pillow was used, medium size. ResMed P10.  The  CPAP Titration started at 6 cm water pressure , with 23 minutes of desaturation, overall AHI of 4.9/h all non supine and non REM sleep. RPSGT  increased to CPAP  to 8 cm ( AHI was 2.9/h) , to 9cm (AHI was 10.2/h)  and finally CPAP reached 11 cm water with a final AHI of 7.2/h in all lateral sleep , with 70% REM sleep and 50 minutes of oxygen desaturation. The patient stayed prolonged in REM sleep with oxygen saturation dropping, while reaching a 100% sleep efficiency over 50 minutes of sleep at this last explored pressure.    OXIMETRY: Total sleep time spent at, or below 88% was 96.6 minutes, or 54.3% of total sleep time. Oxygen desaturation to a nadir of  77.0% were seen , down  from a mean of 92.0%.  Total time spent at, or below 88% was 102.2 minutes, or 57.4%  of total sleep time.  REM sleep dependent hypoxia/ hypoventilation.  Snoring was absent: During the last hour of the study the patient stayed prolonged in REM sleep with  oxygen saturation dropping.     LIMB MOVEMENTS:  There were 0 periodic limb movements of sleep (0.0/h), of which 0 (0.0/h) were associated with an arousal.   AROUSALS: There were 16.0 arousals in total, for an arousal index of 5.4 /hour.  Of these, 4.0 were identified as respiratory-related arousals (1.3 /h), 0 were PLM-related arousals (0.0 /h), and 13 were non-specific arousals (4.4 /h)   EKG : Analysis of electrocardiogram activity showed NSR. The average heart rate during sleep was 64 bpm, heart rate varied between 58 and 77 bpm.   EEG: Symmetric and of normal amplitude without epileptiform activity.    AUDIO and VIDEO : There were no vocalizations, nor complex movements, nor parasomnia activity captured.      IMPRESSION: Moderate Sleep apnea/ Hypopnea with an AHI of 18.10/h responded to CPAP 11 cm H20 , 3 cm H20 EPR,  but was still  associated with frequent oxyhemoglobin desaturation during REM sleep.  This study may underestimate the severity apneas, as REM sleep was very shortly seen prior to the initiation of PAP therapy. Sleep-disordered breathing improved at the recommended pressure of 11 cmH2O with 3 cm EPR during the recording.  The patient remained with REM sleep dependent hypoxia/ hypoventilation.  Snoring was absent.   RECOMMENDATIONS: This patient did very well on CPAP of 11 cm water , heated humidification and 3 cm water EPR while using an elevated head of bed and 2 pillows- but her last 50 minutes of sleep were showing  oxygen desaturation associated with the dominant REM sleep at that time.  REM sleep dependent hypoxia/ hypoventilation may require return for an oxygen titration in the near future.

## 2022-03-06 NOTE — Addendum Note (Signed)
Addended by: Melvyn Novas on: 03/06/2022 06:35 PM   Modules accepted: Orders

## 2022-03-10 ENCOUNTER — Telehealth: Payer: Self-pay | Admitting: Neurology

## 2022-03-10 NOTE — Telephone Encounter (Signed)
I called pt. I advised pt that Dr. Vickey Huger reviewed their sleep study results and found that pt has sleep apnea and was trated with CPAP at a pressure of 11 cm water pressure. Dr. Vickey Huger recommends that pt starts auto CPAP. I reviewed PAP compliance expectations with the pt. Pt is agreeable to starting a CPAP. I advised pt that an order will be sent to a DME, [facility], and [facility] will call the pt within about one week after they file with the pt's insurance.   will show the pt how to use the machine, fit for masks, and troubleshoot the CPAP if needed. A follow up appt was made for insurance purposes with Shawnie Dapper, NP on 05/26/2022 at 9 am. Pt verbalized understanding to arrive 15 minutes early and bring their CPAP. A letter with all of this information in it will be mailed to the pt as a reminder. I verified with the pt that the address we have on file is correct. Pt verbalized understanding of results. Pt had no questions at this time but was encouraged to call back if questions arise. I have sent the order to [facility] and have received confirmation that they have received the order.

## 2022-03-10 NOTE — Telephone Encounter (Signed)
-----   Message from Melvyn Novas, MD sent at 03/06/2022  6:34 PM EDT ----- IMPRESSION: Moderate Sleep apnea/ Hypopnea with an AHI of 18.10/h responded to CPAP 11 cm H20, 3 cm H20EPR, butwasstill associated withfrequentoxyhemoglobin desaturation during REM sleep. This study may underestimate the severity apneas, as REM sleep wasvery shortly seenprior to the initiation of PAP therapy.  The patient remained with REM sleep dependent hypoxia/ hypoventilation. Snoring was absent.  RECOMMENDATIONS: This patient did very well on CPAP of 11 cm water , heated humidification and 3 cm water EPR while using an elevated head of bed and 2 pillows- but her last 50 minutes of sleep were showing oxygen desaturation associated with the dominant REM sleep at that time.nasal pillows , ResMed P 10 in medium were used. I will order an new auto CPAP for her, 6 through 15 cm water, 3 cm EPR and nasal pillow in medium. Hated humidification. .    Caveat: REM sleep dependent hypoxia/ hypoventilation may require return for an oxygen titration in the near future.

## 2022-03-30 ENCOUNTER — Other Ambulatory Visit: Payer: Self-pay | Admitting: Family Medicine

## 2022-03-30 ENCOUNTER — Ambulatory Visit
Admission: RE | Admit: 2022-03-30 | Discharge: 2022-03-30 | Disposition: A | Discharge status: Home / Self Care | Payer: Medicare Other | Source: Ambulatory Visit | Attending: Family Medicine | Admitting: Family Medicine

## 2022-03-30 DIAGNOSIS — R921 Mammographic calcification found on diagnostic imaging of breast: Secondary | ICD-10-CM

## 2022-05-24 NOTE — Patient Instructions (Incomplete)
Please continue using your CPAP regularly. While your insurance requires that you use CPAP at least 4 hours each night on 70% of the nights, I recommend, that you not skip any nights and use it throughout the night if you can. Getting used to CPAP and staying with the treatment long term does take time and patience and discipline. Untreated obstructive sleep apnea when it is moderate to severe can have an adverse impact on cardiovascular health and raise her risk for heart disease, arrhythmias, hypertension, congestive heart failure, stroke and diabetes. Untreated obstructive sleep apnea causes sleep disruption, nonrestorative sleep, and sleep deprivation. This can have an impact on your day to day functioning and cause daytime sleepiness and impairment of cognitive function, memory loss, mood disturbance, and problems focussing. Using CPAP regularly can improve these symptoms.  Please contact [facility] and make sure they help you with your mask. I will send mask refitting   DME: [facility] Phone: [phone], press option 1  Follow up in 3-4 months

## 2022-05-24 NOTE — Progress Notes (Unsigned)
PATIENT: Kerry Liu DOB: 12/21/1950  REASON FOR VISIT: follow up HISTORY FROM: patient  No chief complaint on file.    HISTORY OF PRESENT ILLNESS:  05/24/22 ALL:  Kerry Liu is a 71 y.o. female here today for follow up for OSA on CPAP.  Split night study 01/2022 showed moderate OSA with AHI 18.1/hr responded well to 11cmH20 but continued to have oxygen desats in REM sleep. Since starting CPAP,     HISTORY: (copied from Dr Dohmeier's previous note)  Kerry Liu is a 71 y.o. African American female patient seen here as a referral on 01/06/2022 from new PCP for a sleep evaluation..  Chief concern according to patient :  Kerry Liu, a 71 year old African-American female patient is seen here today after a 5 years hiatus, stating that her CPAP machine which is close to 69 years old is no longer working for her.  She had to stop working with her CPAP and she could not get new supplies either.  I suspect that this is a software issue.  Since her last baseline study which I will "below in summer 2017 there has been some weight loss, and some changes in her medical history including a diabetes diagnosis.   In summer 2017 the patient had a history of right bundle branch block, sinus tachycardia, a pulmonary embolism associated with shortness of breath and pulmonary hypertension, hyperlipidemia and degenerative joint disease osteoarthritis.  At baseline study in 2017 documented mild apnea her AHI was 8.6 but she was excessively daytime sleepy and endorsed the Epworth Sleepiness Scale at 16 out of 24 points.  BMI at the time was 59.4 and neck circumference 17 inches.   Since  she was titrated to CPAP at 7 cm water so she really needed more hypoventilation support than true apnea support.   Oxygen nadir was 89% on CPAP so there was no hypoxia noted.  She was prescribed an AirFit P10 medium size which is a nasal pillow.     She reports at the time followed by Dr. Melford Aase and by [facility].  She now returns after having switched her care to [place] Dr. Reece Levy [address] [facility]. here in [place] for a new evaluation.  She had presented with shortness of breath for the last 2 months increasing shortness of breath sometimes with wheezing and she cannot sleep flat and needs to be elevated.  She is using Lasix once a day which has reduced her fluid overload and her ankle edema.  She also has a history of glaucoma and cataracts, and a new diabetic diagnosis her H1C was 7.5%.  Medication list is quoted below.  Last echocardiogram was in January 2020 and showed a 50% ejection fraction left ventricular function was deemed normal.   She was placed on a restricted salt diet, she continued to take Xarelto after her pulmonary embolism pulmonary care was provided through [facility] pulmonology.  Cardiology had also looked at her CPAP use and stated that she had not been compliant recently.  I   01-06-2022:  looked at her labs it states that she had good cholesterol control, HDL cholesterol is low which would be the good cholesterol and is dependent on physical exercise she does have low triglycerides as well, her fasting glucose was 283 so above normal, creatinine was 0.97 which is excellent so there is no renal function problem.  She does have slightly lower protein content than desired, her ALT liver function enzymes has  been above normal at 45.  AST is normal, the last hemoglobin A1c quoted in her referral map from May 23 of this year was 8.8.  Vitamin D is low.  The patient is not anemic.   REVIEW OF SYSTEMS: Out of a complete 14 system review of symptoms, the patient complains only of the following symptoms, and all other reviewed systems are negative.  ESS:  ALLERGIES: Allergies  Allergen Reactions   Codeine Itching   Hydrocodone Itching   Oxycodone Rash   Tramadol Rash    HOME MEDICATIONS: Outpatient Medications Prior to Visit  Medication Sig Dispense Refill    albuterol (PROVENTIL) (2.5 MG/3ML) 0.083% nebulizer solution Take 3 mLs (2.5 mg total) by nebulization every 6 (six) hours as needed for wheezing or shortness of breath. 75 mL 12   amLODipine (NORVASC) 5 MG tablet Take 5 mg by mouth daily.     atorvastatin (LIPITOR) 80 MG tablet Take 0.5 tablets (40 mg total) by mouth every evening. 30 tablet 0   Blood Glucose Monitoring Suppl (ONETOUCH VERIO REFLECT) w/Device KIT USE METER AS DIRECTED ONCE DAILY TO CHECK FINGERSTICK GLUCOSE     brimonidine (ALPHAGAN) 0.2 % ophthalmic solution 1 drop 3 (three) times daily.     Cholecalciferol 25 MCG (1000 UT) capsule Take 2 capsules (2,000 Units total) by mouth daily. 60 capsule 2   ciclopirox (PENLAC) 8 % solution Apply topically at bedtime. Apply over nail and surrounding skin. Apply daily over previous coat. After seven (7) days, may remove with alcohol and continue cycle. 6.6 mL 4   clotrimazole (LOTRIMIN) 1 % cream Apply to affected feet and between toes twice daily for 6 weeks for athlete's feet 60 g 1   dorzolamide-timolol (COSOPT) 22.3-6.8 MG/ML ophthalmic solution 1 drop 2 (two) times daily.     furosemide (LASIX) 40 MG tablet Take 1 tablet (40 mg total) by mouth daily. 90 tablet 3   JARDIANCE 10 MG TABS tablet Take 10 mg by mouth daily.     Lancets (ONETOUCH DELICA PLUS [id number]) MISC SMARTSIG:Via Meter     losartan (COZAAR) 50 MG tablet Take 1 tablet (50 mg total) by mouth daily. 90 tablet 0   metoprolol tartrate (LOPRESSOR) 50 MG tablet Take 1 tablet (50 mg total) by mouth 2 (two) times daily. 90 tablet 1   ONETOUCH VERIO test strip daily.     potassium chloride SA (KLOR-CON M20) 20 MEQ tablet Take 1 tablet (20 mEq total) by mouth daily. 90 tablet 3   prednisoLONE acetate (PRED FORTE) 1 % ophthalmic suspension Place into the right eye.     RESTASIS 0.05 % ophthalmic emulsion      rivaroxaban (XARELTO) 20 MG TABS tablet Take 1 tablet (20 mg total) by mouth daily with supper. 30 tablet 6   Vitamin D,  Ergocalciferol, (DRISDOL) 1.25 MG (50000 UNIT) CAPS capsule Take 1 capsule (50,000 Units total) by mouth every 7 (seven) days. 12 capsule 0   No facility-administered medications prior to visit.    PAST MEDICAL HISTORY: Past Medical History:  Diagnosis Date   Acute meniscal tear of knee    RIGHT   Arthritis    "right knee" (11/21/2012)   Elevated hemoglobin A1c    Exertional shortness of breath    "this week" (11/21/2012)   History of DVT (deep vein thrombosis)    IN PREGNANCY   HTN (hypertension)    CARDIOLOGIST--  DR HOCHREIN-- CLEARANCE NOTE IN EPIC AND CHART FROM 08-21-2012   Mixed hyperlipidemia  Obesity    Pulmonary embolism ([place])    "3 today" (11/21/2012)   RBBB (right bundle branch block with left anterior fascicular block)    Seasonal allergies    Sinus tachycardia    Sleep apnea    Vitamin D deficiency     PAST SURGICAL HISTORY: Past Surgical History:  Procedure Laterality Date   IVC filter placed in May 2014 N/A    KNEE ARTHROSCOPY Right 12-09-2002   KNEE ARTHROSCOPY WITH MEDIAL MENISECTOMY Right 10/05/2012   Procedure: KNEE ARTHROSCOPY WITH MEDIAL MENISECTOMY;  Surgeon: Tobi Bastos, MD;  Location: [place];  Service: Orthopedics;  Laterality: Right;   TOTAL ABDOMINAL HYSTERECTOMY W/ BILATERAL SALPINGOOPHORECTOMY  ~ 1986   W/ BILATERAL SALPINGOOPHORECTOMY   TRANSTHORACIC ECHOCARDIOGRAM  11-17-2010   MILD LVH/ EF 20-25%/ GRADE I DIASTOLIC DYSFUNCTION/ MILD LAE / MILD RAE    FAMILY HISTORY: Family History  Problem Relation Age of Onset   Heart attack Father    Hypertension Brother    Diabetes Brother    Stroke Brother    Sudden death Brother    Heart attack Other    Breast cancer Neg Hx     SOCIAL HISTORY: Social History   Socioeconomic History   Marital status: Single    Spouse name: Not on file   Number of children: 1   Years of education: Not on file   Highest education level: High school graduate  Occupational History    Occupation: housekeeper    Employer: [employer]  Tobacco Use   Smoking status: Never   Smokeless tobacco: Never  Substance and Sexual Activity   Alcohol use: No   Drug use: No   Sexual activity: Never  Other Topics Concern   Not on file  Social History Narrative   Lives alone   R handed   Caffeine: rare   Social Determinants of Health   Financial Resource Strain: Not on file  Food Insecurity: Not on file  Transportation Needs: Not on file  Physical Activity: Not on file  Stress: Not on file  Social Connections: Not on file  Intimate Partner Violence: Not on file     PHYSICAL EXAM  There were no vitals filed for this visit. There is no height or weight on file to calculate BMI.  Generalized: Well developed, in no acute distress  Cardiology: normal rate and rhythm, no murmur noted Respiratory: clear to auscultation bilaterally  Neurological examination  Mentation: Alert oriented to time, place, history taking. Follows all commands speech and language fluent Cranial nerve II-XII: Pupils were equal round reactive to light. Extraocular movements were full, visual field were full  Motor: The motor testing reveals 5 over 5 strength of all 4 extremities. Good symmetric motor tone is noted throughout.  Gait and station: Gait is normal.    DIAGNOSTIC DATA (LABS, IMAGING, TESTING) - I reviewed patient records, labs, notes, testing and imaging myself where available.      No data to display           Lab Results  Component Value Date   WBC 13.0 (H) 05/10/2021   HGB 14.7 05/10/2021   HCT 44.8 05/10/2021   MCV 89 05/10/2021   PLT 293 05/10/2021      Component Value Date/Time   NA 142 05/10/2021 1007   K 4.6 05/10/2021 1007   CL 100 05/10/2021 1007   CO2 27 05/10/2021 1007   GLUCOSE 164 (H) 05/10/2021 1007   GLUCOSE 122 (H) 07/25/2018 0550  BUN 19 05/10/2021 1007   CREATININE 0.94 05/10/2021 1007   CREATININE 0.99 08/10/2017 1101   CALCIUM 9.7  05/10/2021 1007   PROT 6.3 08/10/2017 1101   ALBUMIN 3.4 (L) 09/07/2016 1547   AST 17 08/10/2017 1101   ALT 18 08/10/2017 1101   ALKPHOS 111 09/07/2016 1547   BILITOT 0.8 08/10/2017 1101   GFRNONAA 60 07/30/2018 1047   GFRNONAA 60 08/10/2017 1101   GFRAA 69 07/30/2018 1047   GFRAA 70 08/10/2017 1101   Lab Results  Component Value Date   CHOL 168 05/10/2021   HDL 55 05/10/2021   LDLCALC 103 (H) 05/10/2021   TRIG 47 05/10/2021   CHOLHDL 3.1 05/10/2021   Lab Results  Component Value Date   HGBA1C 6.0 (H) 07/22/2018   Lab Results  Component Value Date   VITAMINB12 556 05/30/2013   Lab Results  Component Value Date   TSH 2.66 03/28/2017     ASSESSMENT AND PLAN 71 y.o. year old female  has a past medical history of Acute meniscal tear of knee, Arthritis, Elevated hemoglobin A1c, Exertional shortness of breath, History of DVT (deep vein thrombosis), HTN (hypertension), Mixed hyperlipidemia, Obesity, Pulmonary embolism ([place]), RBBB (right bundle branch block with left anterior fascicular block), Seasonal allergies, Sinus tachycardia, Sleep apnea, and Vitamin D deficiency. here with   No diagnosis found.    JAEMARIE HOCHBERG is doing well on CPAP therapy. Compliance report reveals ***. *** was encouraged to continue using CPAP nightly and for greater than 4 hours each night. We will update supply orders as indicated. Risks of untreated sleep apnea review and education materials provided. Healthy lifestyle habits encouraged. *** will follow up in ***, sooner if needed. *** verbalizes understanding and agreement with this plan.    No orders of the defined types were placed in this encounter.    No orders of the defined types were placed in this encounter.     Debbora Presto, FNP-C 05/24/2022, 4:23 PM [facility] [address] [place], [place] [address] [phone]

## 2022-05-26 ENCOUNTER — Ambulatory Visit (INDEPENDENT_AMBULATORY_CARE_PROVIDER_SITE_OTHER): Payer: Medicare Other | Admitting: Family Medicine

## 2022-05-26 ENCOUNTER — Encounter: Payer: Self-pay | Admitting: Family Medicine

## 2022-05-26 VITALS — BP 152/79 | HR 90 | Ht 62.0 in | Wt 303.0 lb

## 2022-05-26 DIAGNOSIS — G4733 Obstructive sleep apnea (adult) (pediatric): Secondary | ICD-10-CM | POA: Diagnosis not present

## 2022-05-30 ENCOUNTER — Ambulatory Visit (INDEPENDENT_AMBULATORY_CARE_PROVIDER_SITE_OTHER): Payer: Medicare Other | Admitting: Podiatry

## 2022-05-30 ENCOUNTER — Encounter: Payer: Self-pay | Admitting: Podiatry

## 2022-05-30 VITALS — BP 142/77

## 2022-05-30 DIAGNOSIS — B351 Tinea unguium: Secondary | ICD-10-CM | POA: Diagnosis not present

## 2022-05-30 DIAGNOSIS — M79674 Pain in right toe(s): Secondary | ICD-10-CM | POA: Diagnosis not present

## 2022-05-30 DIAGNOSIS — M79675 Pain in left toe(s): Secondary | ICD-10-CM | POA: Diagnosis not present

## 2022-05-30 DIAGNOSIS — E119 Type 2 diabetes mellitus without complications: Secondary | ICD-10-CM

## 2022-05-30 NOTE — Progress Notes (Signed)
  Subjective:  Patient ID: Kerry Liu, female    DOB: 11/26/1950,  MRN: [id number]  Kerry Liu presents to clinic today for preventative diabetic foot care and painful elongated mycotic toenails 1-5 bilaterally which are tender when wearing enclosed shoe gear. Pain is relieved with periodic professional debridement.  Chief Complaint  Patient presents with   Nail Problem    Diabetic foot care BS- has not checked yet A1C-6.0 PCP-[place] PCP VST-04/2022   New problem(s): None.   PCP is Margot Ables, MD (Inactive).  Allergies  Allergen Reactions   Codeine Itching   Hydrocodone Itching   Oxycodone Rash   Tramadol Rash    Review of Systems: Negative except as noted in the HPI.  Objective: No changes noted in today's physical examination. Vitals:   05/30/22 1103  BP: (!) 142/77   Kerry Liu is a pleasant 71 y.o. female morbidly obese in NAD. AAO x 3.  Vascular CFT <3 seconds b/l LE. Palpable DP pulse(s) b/l LE. Palpable PT pulse(s) b/l LE. Pedal hair present. No pain with calf compression b/l. Lower extremity skin temperature gradient within normal limits. No edema noted b/l LE. No cyanosis or clubbing noted b/l LE.  Neurologic Normal speech. Oriented to person, place, and time. Protective sensation intact 5/5 intact bilaterally with 10g monofilament b/l. Vibratory sensation intact b/l.  Dermatologic Pedal skin is warm and supple b/l LE. No open wounds b/l LE. No interdigital macerations noted b/l LE. Toenails 1-5 bilaterally elongated, discolored, dystrophic, thickened, and crumbly with subungual debris and tenderness to dorsal palpation.   Orthopedic: Normal muscle strength 5/5 to all lower extremity muscle groups bilaterally. No pain, crepitus or joint limitation noted with ROM b/l LE. No gross bony pedal deformities b/l. Patient ambulates independently without assistive aids.   Radiographs: None Assessment/Plan: 1. Pain due to onychomycosis of toenails of  both feet   2. Controlled type 2 diabetes mellitus without complication, without long-term current use of insulin (HCC)     No orders of the defined types were placed in this encounter.   None -Consent given for treatment as described below: -Continue foot and shoe inspections daily. Monitor blood glucose per PCP/Endocrinologist's recommendations. -Continue supportive shoe gear daily. -Toenails 1-5 b/l were debrided in length and girth with sterile nail nippers and dremel without iatrogenic bleeding.  -Patient/POA to call should there be question/concern in the interim.   Return in about 3 months (around 08/29/2022).  Freddie Breech, DPM

## 2022-07-01 ENCOUNTER — Emergency Department (HOSPITAL_COMMUNITY)
Admission: EM | Admit: 2022-07-01 | Discharge: 2022-07-01 | Disposition: A | Discharge status: Home / Self Care | Payer: 59 | Attending: Emergency Medicine | Admitting: Emergency Medicine

## 2022-07-01 ENCOUNTER — Other Ambulatory Visit: Payer: Self-pay

## 2022-07-01 ENCOUNTER — Emergency Department (HOSPITAL_BASED_OUTPATIENT_CLINIC_OR_DEPARTMENT_OTHER): Payer: 59

## 2022-07-01 ENCOUNTER — Encounter (HOSPITAL_COMMUNITY): Payer: Self-pay

## 2022-07-01 DIAGNOSIS — Z7901 Long term (current) use of anticoagulants: Secondary | ICD-10-CM | POA: Diagnosis not present

## 2022-07-01 DIAGNOSIS — M79662 Pain in left lower leg: Secondary | ICD-10-CM | POA: Insufficient documentation

## 2022-07-01 DIAGNOSIS — R52 Pain, unspecified: Secondary | ICD-10-CM

## 2022-07-01 DIAGNOSIS — Z79899 Other long term (current) drug therapy: Secondary | ICD-10-CM | POA: Insufficient documentation

## 2022-07-01 DIAGNOSIS — Z7984 Long term (current) use of oral hypoglycemic drugs: Secondary | ICD-10-CM | POA: Diagnosis not present

## 2022-07-01 LAB — CBC WITH DIFFERENTIAL/PLATELET
Abs Immature Granulocytes: 0.05 10*3/uL (ref 0.00–0.07)
Basophils Absolute: 0.1 10*3/uL (ref 0.0–0.1)
Basophils Relative: 1 %
Eosinophils Absolute: 0.2 10*3/uL (ref 0.0–0.5)
Eosinophils Relative: 2 %
HCT: 43.9 % (ref 36.0–46.0)
Hemoglobin: 14.1 g/dL (ref 12.0–15.0)
Immature Granulocytes: 1 %
Lymphocytes Relative: 13 %
Lymphs Abs: 1.2 10*3/uL (ref 0.7–4.0)
MCH: 30.1 pg (ref 26.0–34.0)
MCHC: 32.1 g/dL (ref 30.0–36.0)
MCV: 93.6 fL (ref 80.0–100.0)
Monocytes Absolute: 0.8 10*3/uL (ref 0.1–1.0)
Monocytes Relative: 9 %
Neutro Abs: 6.7 10*3/uL (ref 1.7–7.7)
Neutrophils Relative %: 74 %
Platelets: 203 10*3/uL (ref 150–400)
RBC: 4.69 MIL/uL (ref 3.87–5.11)
RDW: 12.5 % (ref 11.5–15.5)
WBC: 8.9 10*3/uL (ref 4.0–10.5)
nRBC: 0 % (ref 0.0–0.2)

## 2022-07-01 LAB — COMPREHENSIVE METABOLIC PANEL
ALT: 34 U/L (ref 0–44)
AST: 26 U/L (ref 15–41)
Albumin: 3.7 g/dL (ref 3.5–5.0)
Alkaline Phosphatase: 88 U/L (ref 38–126)
Anion gap: 8 (ref 5–15)
BUN: 26 mg/dL — ABNORMAL HIGH (ref 8–23)
CO2: 25 mmol/L (ref 22–32)
Calcium: 9.5 mg/dL (ref 8.9–10.3)
Chloride: 106 mmol/L (ref 98–111)
Creatinine, Ser: 1.06 mg/dL — ABNORMAL HIGH (ref 0.44–1.00)
GFR, Estimated: 57 mL/min — ABNORMAL LOW (ref >60)
Glucose, Bld: 192 mg/dL — ABNORMAL HIGH (ref 70–99)
Potassium: 4.6 mmol/L (ref 3.5–5.1)
Sodium: 139 mmol/L (ref 135–145)
Total Bilirubin: 0.8 mg/dL (ref 0.3–1.2)
Total Protein: 7.2 g/dL (ref 6.5–8.1)

## 2022-07-01 MED ORDER — CYCLOBENZAPRINE HCL 10 MG PO TABS: 10.0000 mg | ORAL_TABLET | Freq: Every day | ORAL | 0 refills | Status: AC

## 2022-07-01 NOTE — ED Provider Notes (Signed)
[facility] EMERGENCY DEPARTMENT Provider Note   CSN: [id number] Arrival date & time: 07/01/22  1234     History  Chief Complaint  Patient presents with   Lt Leg Pain    Kerry Liu is a 72 y.o. female with a history of 3 prior DVTs, on Xarelto, presenting for DVT ultrasound.  The patient presents with a paper form signed by her physician, who reports that the patient is being referred in for DVT ultrasound because she has been having pain in her left lower leg.  She reports pain in her left calf ongoing for about 2 to 3 days.  It is waxing and waning at times, nothing makes it worse.  She does suffer from muscle spasms.  Denies any chest pain or shortness of breath  HPI     Home Medications Prior to Admission medications   Medication Sig Start Date End Date Taking? Authorizing Provider  cyclobenzaprine (FLEXERIL) 10 MG tablet Take 1 tablet (10 mg total) by mouth at bedtime for 10 doses. 07/01/22 07/11/22 Yes Daren Doswell, Kermit Balo, MD  albuterol (PROVENTIL) (2.5 MG/3ML) 0.083% nebulizer solution Take 3 mLs (2.5 mg total) by nebulization every 6 (six) hours as needed for wheezing or shortness of breath. 07/01/16   Doree Albee, PA-C  amLODipine (NORVASC) 5 MG tablet Take 5 mg by mouth daily. 12/03/21   [provider]  atorvastatin (LIPITOR) 80 MG tablet Take 0.5 tablets (40 mg total) by mouth every evening. 03/28/19   Santos-Sanchez, Chelsea Primus, MD  Blood Glucose Monitoring Suppl (ONETOUCH VERIO REFLECT) w/Device KIT USE METER AS DIRECTED ONCE DAILY TO CHECK FINGERSTICK GLUCOSE 11/03/21   [provider]  brimonidine (ALPHAGAN) 0.2 % ophthalmic solution 1 drop 3 (three) times daily. 11/02/21   [provider]  Cholecalciferol 25 MCG (1000 UT) capsule Take 2 capsules (2,000 Units total) by mouth daily. 01/10/19   Santos-Sanchez, Chelsea Primus, MD  ciclopirox (PENLAC) 8 % solution Apply topically at bedtime. Apply over nail and surrounding skin. Apply daily over  previous coat. After seven (7) days, may remove with alcohol and continue cycle. 10/28/19   Vivi Barrack, DPM  clotrimazole (LOTRIMIN) 1 % cream Apply to affected feet and between toes twice daily for 6 weeks for athlete's feet 08/06/21   [place], Lise Auer, DPM  dorzolamide-timolol (COSOPT) 22.3-6.8 MG/ML ophthalmic solution 1 drop 2 (two) times daily. 11/02/21   [provider]  furosemide (LASIX) 40 MG tablet Take 1 tablet (40 mg total) by mouth daily. 05/10/21 08/17/21  Tobb, Kardie, DO  JARDIANCE 10 MG TABS tablet Take 10 mg by mouth daily. 11/05/21   [provider]  Lancets (ONETOUCH DELICA PLUS LANCET30G) MISC SMARTSIG:Via Meter 11/05/21   [provider]  losartan (COZAAR) 50 MG tablet Take 1 tablet (50 mg total) by mouth daily. 03/28/19   Santos-Sanchez, Chelsea Primus, MD  metoprolol tartrate (LOPRESSOR) 50 MG tablet Take 1 tablet (50 mg total) by mouth 2 (two) times daily. 03/28/19   Burna Cash, MD  [facility] VERIO test strip daily. 11/03/21   [provider]  potassium chloride SA (KLOR-CON M20) 20 MEQ tablet Take 1 tablet (20 mEq total) by mouth daily. 05/10/21 08/17/21  Tobb, Lavona Mound, DO  prednisoLONE acetate (PRED FORTE) 1 % ophthalmic suspension Place into the right eye. 11/29/21   [provider]  RESTASIS 0.05 % ophthalmic emulsion  09/02/21   [provider]  rivaroxaban (XARELTO) 20 MG TABS tablet Take 1 tablet (20 mg total) by mouth  daily with supper. 03/28/19   Santos-Sanchez, Merlene Morse, MD  Vitamin D, Ergocalciferol, (DRISDOL) 1.25 MG (50000 UNIT) CAPS capsule Take 1 capsule (50,000 Units total) by mouth every 7 (seven) days. 05/17/21   Tobb, Kardie, DO      Allergies    Codeine, Hydrocodone, Oxycodone, and Tramadol    Review of Systems   Review of Systems  Physical Exam Updated Vital Signs BP 120/72 (BP Location: Left Arm)   Pulse 88   Temp 98.5 F (36.9 C) (Oral)   Resp 15   SpO2 97%  Physical Exam Constitutional:       General: She is not in acute distress.    Appearance: She is obese.  HENT:     Head: Normocephalic and atraumatic.  Eyes:     Conjunctiva/sclera: Conjunctivae normal.     Pupils: Pupils are equal, round, and reactive to light.  Cardiovascular:     Rate and Rhythm: Normal rate and regular rhythm.  Pulmonary:     Effort: Pulmonary effort is normal. No respiratory distress.  Abdominal:     General: There is no distension.     Tenderness: There is no abdominal tenderness.  Musculoskeletal:     Comments: Tenderness along the left posterior calf  Skin:    General: Skin is warm and dry.  Neurological:     General: No focal deficit present.     Mental Status: She is alert. Mental status is at baseline.  Psychiatric:        Mood and Affect: Mood normal.        Behavior: Behavior normal.     ED Results / Procedures / Treatments   Labs (all labs ordered are listed, but only abnormal results are displayed) Labs Reviewed  COMPREHENSIVE METABOLIC PANEL - Abnormal; Notable for the following components:      Result Value   Glucose, Bld 192 (*)    BUN 26 (*)    Creatinine, Ser 1.06 (*)    GFR, Estimated 57 (*)    All other components within normal limits  CBC WITH DIFFERENTIAL/PLATELET    EKG None  Radiology VAS [place] LOWER EXTREMITY VENOUS (DVT) (7a-7p)  Result Date: 07/01/2022  Lower Venous DVT Study Patient Name:  Kerry Liu  Date of Exam:   07/01/2022 Medical Rec #: [id number]       Accession #:    [id number] Date of Birth: June 07, 1951        Patient Gender: F Patient Age:   89 years Exam Location:  [facility] Procedure:      VAS [place] LOWER EXTREMITY VENOUS (DVT) Referring Phys: COOPER ROBBINS --------------------------------------------------------------------------------  Indications: Pain.  Comparison Study: 06/08/16 prior Performing Technologist: Archie Patten RVS  Examination Guidelines: A complete evaluation includes B-mode imaging, spectral Doppler, color Doppler, and  power Doppler as needed of all accessible portions of each vessel. Bilateral testing is considered an integral part of a complete examination. Limited examinations for reoccurring indications may be performed as noted. The reflux portion of the exam is performed with the patient in reverse Trendelenburg.  +-----+---------------+---------+-----------+----------+--------------+ RIGHTCompressibilityPhasicitySpontaneityPropertiesThrombus Aging +-----+---------------+---------+-----------+----------+--------------+ CFV  Full           Yes      Yes                                 +-----+---------------+---------+-----------+----------+--------------+   +--------+---------------+---------+-----------+----------+--------------------+ LEFT    CompressibilityPhasicitySpontaneityPropertiesThrombus Aging       +--------+---------------+---------+-----------+----------+--------------------+ CFV  Full           Yes      Yes                                       +--------+---------------+---------+-----------+----------+--------------------+ SFJ     Full                                                              +--------+---------------+---------+-----------+----------+--------------------+ FV Prox Full                                                              +--------+---------------+---------+-----------+----------+--------------------+ FV Mid  Full           Yes      Yes                                       +--------+---------------+---------+-----------+----------+--------------------+ FV                     Yes      Yes                  patent by color      Distal                                               doppler              +--------+---------------+---------+-----------+----------+--------------------+ PFV     Full                                                               +--------+---------------+---------+-----------+----------+--------------------+ POP     Full                                                              +--------+---------------+---------+-----------+----------+--------------------+ PTV     Full                                                              +--------+---------------+---------+-----------+----------+--------------------+ PERO                   Yes      Yes  patent by color                                                           doppler              +--------+---------------+---------+-----------+----------+--------------------+    Summary: RIGHT: - No evidence of common femoral vein obstruction.  LEFT: - There is no evidence of deep vein thrombosis in the lower extremity.  - No cystic structure found in the popliteal fossa.  *See table(s) above for measurements and observations.    Preliminary     Procedures Procedures    Medications Ordered in ED Medications - No data to display  ED Course/ Medical Decision Making/ A&P                           Medical Decision Making  Patient is here with left lower calf pain and tenderness for 2 to 3 days.  Differential would include DVT versus thrombophlebitis versus muscle spasm versus hematoma versus other  Vascular ultrasound was performed persevered interpreted with no acute thrombus.  The patient is neurovascularly intact.  Her pain appears quite well-controlled.  She does have significant tenderness when I palpate and wonder whether she may be experiencing some type of muscle spasm in her leg.  We can try a muscle relaxer at home for the next several days.  She has a follow-up with her PCP again next week.  I reviewed her external records including the paper form she arrived with an office note from her PCP.  I personally viewed interpreted patient's blood work was unremarkable.  Electrolytes appear to be within normal limits.  She has  some mild hyperglycemia consistent with diabetes.  No indication for further imaging at this time.        Final Clinical Impression(s) / ED Diagnoses Final diagnoses:  Pain of left calf    Rx / DC Orders ED Discharge Orders          Ordered    cyclobenzaprine (FLEXERIL) 10 MG tablet  Daily at bedtime        07/01/22 1736              Terald Sleeper, MD 07/01/22 1737

## 2022-07-01 NOTE — ED Triage Notes (Signed)
Pt came in POV d/t Lt leg pain x2 days, does have Hx of DVT, pain is in her Lt calf, good pulses, A/Ox4.

## 2022-07-01 NOTE — ED Notes (Signed)
Patient verbalizes understanding of discharge instructions. Opportunity for questioning and answers were provided. Armband removed by staff, pt discharged from ED. Pt taken to ED waiting room via wheel chair.  

## 2022-07-01 NOTE — ED Provider Triage Note (Addendum)
Emergency Medicine Provider Triage Evaluation Note  Kerry Liu , a 72 y.o. female  was evaluated in triage.  Pt complains of left leg pain.  Patient reported being sent here by primary care for DVT rule out.  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  States that she has been compliant with medicine.  Denies any known trauma to affected leg.  Patient states that pain began insidiously 2 days ago and has been persistent since.  Denies fever, chills, night sweats, weakness/sensory deficits in affected leg..  Review of Systems  Positive: See abov Negative:   Physical Exam  BP 132/72   Pulse (!) 109   Temp 98 F (36.7 C) (Oral)   Resp 20   SpO2 97%  Gen:   Awake, no distress   Resp:  Normal effort  MSK:   Moves extremities without difficulty  Other:  Bilateral lower extremity edema.  Tender palpation left calf.  Symmetric pedal pulses.  Medical Decision Making  Medically screening exam initiated at 2:06 PM.  Appropriate orders placed.  Kerry Liu was informed that the remainder of the evaluation will be completed by another provider, this initial triage assessment does not replace that evaluation, and the importance of remaining in the ED until their evaluation is complete.     Kerry Liu, [place] 07/01/22 [place], [place], [place] 07/01/22 1407

## 2022-07-01 NOTE — Discharge Instructions (Addendum)
Your DVT (blood clot) ultrasound was negative, or normal today.    I prescribed a muscle relaxer which you can try taking at night for the next few days until you see your doctor again next week in the office.  Please call your doctor's office tomorrow and arrange for follow-up appointment.

## 2022-07-01 NOTE — Progress Notes (Signed)
Lower extremity venous has been completed.   Preliminary results in CV Proc.   Jinny Blossom Danaria Larsen 07/01/2022 3:11 PM

## 2022-08-05 ENCOUNTER — Encounter: Payer: Self-pay | Admitting: Family Medicine

## 2022-08-05 ENCOUNTER — Other Ambulatory Visit: Payer: Self-pay | Admitting: Geriatric Medicine

## 2022-08-05 DIAGNOSIS — R921 Mammographic calcification found on diagnostic imaging of breast: Secondary | ICD-10-CM

## 2022-08-10 ENCOUNTER — Other Ambulatory Visit: Payer: Self-pay | Admitting: Adult Health

## 2022-08-10 DIAGNOSIS — R921 Mammographic calcification found on diagnostic imaging of breast: Secondary | ICD-10-CM

## 2022-09-13 ENCOUNTER — Encounter: Payer: Self-pay | Admitting: Podiatry

## 2022-09-13 ENCOUNTER — Ambulatory Visit (INDEPENDENT_AMBULATORY_CARE_PROVIDER_SITE_OTHER): Payer: Medicare Other | Admitting: Podiatry

## 2022-09-13 VITALS — BP 147/71

## 2022-09-13 DIAGNOSIS — M79674 Pain in right toe(s): Secondary | ICD-10-CM

## 2022-09-13 DIAGNOSIS — B351 Tinea unguium: Secondary | ICD-10-CM | POA: Diagnosis not present

## 2022-09-13 DIAGNOSIS — E119 Type 2 diabetes mellitus without complications: Secondary | ICD-10-CM | POA: Diagnosis not present

## 2022-09-13 DIAGNOSIS — M79675 Pain in left toe(s): Secondary | ICD-10-CM | POA: Diagnosis not present

## 2022-09-15 NOTE — Progress Notes (Signed)
  Subjective:  Patient ID: Kerry Liu, female    DOB: 07-20-1950,  MRN: [id number]  Kerry Liu presents to clinic today for preventative diabetic foot care and painful elongated mycotic toenails 1-5 bilaterally which are tender when wearing enclosed shoe gear. Pain is relieved with periodic professional debridement.  Chief Complaint  Patient presents with   Nail Problem    [facility] BS-130 yesterday  A1C-6.0 PCP-[address]([place]) PCP VST-07/2022   New problem(s): None.   PCP is Buzzy Han, MD (Inactive).  Allergies  Allergen Reactions   Codeine Itching   Hydrocodone Itching   Oxycodone Rash   Tramadol Rash    Review of Systems: Negative except as noted in the HPI.  Objective: No changes noted in today's physical examination. Vitals:   09/13/22 1605  BP: (!) 147/71   Kerry Liu is a pleasant 72 y.o. female morbidly obese in NAD. AAO x 3.  Vascular CFT <3 seconds b/l LE. Palpable DP pulse(s) b/l LE. Palpable PT pulse(s) b/l LE. Pedal hair present. No pain with calf compression b/l. Lower extremity skin temperature gradient within normal limits. No edema noted b/l LE. No cyanosis or clubbing noted b/l LE.  Neurologic Normal speech. Oriented to person, place, and time. Protective sensation intact 5/5 intact bilaterally with 10g monofilament b/l. Vibratory sensation intact b/l.  Dermatologic Pedal skin is warm and supple b/l LE. No open wounds b/l LE. No interdigital macerations noted b/l LE. Toenails 1-5 bilaterally elongated, discolored, dystrophic, thickened, and crumbly with subungual debris and tenderness to dorsal palpation.   Orthopedic: Normal muscle strength 5/5 to all lower extremity muscle groups bilaterally. No pain, crepitus or joint limitation noted with ROM b/l LE. No gross bony pedal deformities b/l. Patient ambulates independently without assistive aids.   Radiographs: None  Assessment/Plan: 1. Pain due to onychomycosis of toenails of both  feet   2. Controlled type 2 diabetes mellitus without complication, without long-term current use of insulin ([place])    -Patient was evaluated and treated. All patient's and/or POA's questions/concerns answered on today's visit. -Examined patient. -Patient to continue soft, supportive shoe gear daily. -Mycotic toenails 1-5 bilaterally were debrided in length and girth with sterile nail nippers and dremel without incident. -Patient/POA to call should there be question/concern in the interim.   Return in about 3 months (around 12/14/2022).  Marzetta Board, DPM

## 2022-10-04 ENCOUNTER — Ambulatory Visit (INDEPENDENT_AMBULATORY_CARE_PROVIDER_SITE_OTHER): Payer: Medicare PPO | Admitting: Family Medicine

## 2022-10-04 ENCOUNTER — Encounter: Payer: Self-pay | Admitting: Family Medicine

## 2022-10-04 VITALS — BP 149/52 | HR 71 | Ht 62.0 in | Wt 293.0 lb

## 2022-10-04 DIAGNOSIS — G4733 Obstructive sleep apnea (adult) (pediatric): Secondary | ICD-10-CM

## 2022-10-04 NOTE — Patient Instructions (Addendum)
Please continue using your CPAP regularly. While your insurance requires that you use CPAP at least 4 hours each night on 70% of the nights, I recommend, that you not skip any nights and use it throughout the night if you can. Getting used to CPAP and staying with the treatment long term does take time and patience and discipline. Untreated obstructive sleep apnea when it is moderate to severe can have an adverse impact on cardiovascular health and raise her risk for heart disease, arrhythmias, hypertension, congestive heart failure, stroke and diabetes. Untreated obstructive sleep apnea causes sleep disruption, nonrestorative sleep, and sleep deprivation. This can have an impact on your day to day functioning and cause daytime sleepiness and impairment of cognitive function, memory loss, mood disturbance, and problems focussing. Using CPAP regularly can improve these symptoms.  We will update supply orders, today. Please call [facility] for any suppply requests/mask refitting needs and insurance or billing questions regarding your CPAP machine.   DME: [facility] Phone: [phone], press option 1  Follow up in 6 months

## 2022-10-04 NOTE — Progress Notes (Signed)
PATIENT: Kerry Liu DOB: [birth year]  REASON FOR VISIT: follow up HISTORY FROM: patient  Chief Complaint  Patient presents with   Follow-up    RM 1. Last seen 05/26/22.      HISTORY OF PRESENT ILLNESS:  10/04/22 ALL:  Kerry Liu returns for follow up for OSA on CPAP. She was last seen 04/2022 and not using CPAP therapy. We sent her for a mask refitting and encouraged consistent usage. She reports getting a new nasal mask but feels the size is wrong. She continues to note air leaking from mask. She has not been able to get new supplies due to an outstanding balance. She had new insurance coverage in 06/2022 and reports being told by [facility] that she didn't have to use her CPAP until insurance paid for the machine. She is planning to go to the office in person to discuss billing and request mask refitting. She has been on CPAP therapy for the past 4-5 years and is willing to continue.     05/26/2022 ALL: Kerry Liu is a 72 y.o. female here today for follow up for OSA on CPAP.  Split night study 01/2022 showed moderate OSA with AHI 18.1/hr responded well to 11cmH20 but continued to have oxygen desats in REM sleep. AutoPAP ordered. Since, she reports difficulty with her mask. She reports only using 3-4 times since getting her machine. I do not have any data to confirm. She reports using a nasal pillow mask and feels it is too big. She can not get a good fit and air blows around mask. She reports reaching out to [facility] but has not been able to go in for mask refitting. She was previously doing well on old machine but it no longer works.    HISTORY: (copied from Dr Dohmeier's previous note)  Kerry Liu is a 72 y.o. African American female patient seen here as a referral on 01/06/2022 from new PCP for a sleep evaluation..  Chief concern according to patient :  Kerry Liu, a 72 year old African-American female patient is seen here today after a 5 years hiatus, stating that her CPAP machine  which is close to 70 years old is no longer working for her.  She had to stop working with her CPAP and she could not get new supplies either.  I suspect that this is a software issue.  Since her last baseline study which I will "below in summer 2017 there has been some weight loss, and some changes in her medical history including a diabetes diagnosis.   In summer 2017 the patient had a history of right bundle branch block, sinus tachycardia, a pulmonary embolism associated with shortness of breath and pulmonary hypertension, hyperlipidemia and degenerative joint disease osteoarthritis.  At baseline study in 2017 documented mild apnea her AHI was 8.6 but she was excessively daytime sleepy and endorsed the Epworth Sleepiness Scale at 16 out of 24 points.  BMI at the time was 59.4 and neck circumference 17 inches.   Since  she was titrated to CPAP at 7 cm water so she really needed more hypoventilation support than true apnea support.   Oxygen nadir was 89% on CPAP so there was no hypoxia noted.  She was prescribed an AirFit P10 medium size which is a nasal pillow.     She reports at the time followed by Dr. Oneta Rack and by advanced home care.  She now returns after having switched her care to [facility] medical Dr. Betti Cruz [address] here in [place] for a new evaluation.  She had presented with shortness of breath for the last 2 months increasing shortness of breath sometimes with wheezing and she cannot sleep flat and needs to be elevated.  She is using Lasix once a day which has reduced her fluid overload and her ankle edema.  She also has a history of glaucoma and cataracts, and a new diabetic diagnosis her H1C was 7.5%.  Medication list is quoted below.  Last echocardiogram was in January 2020 and showed a 50% ejection fraction left ventricular function was deemed normal.   She was placed on a restricted salt diet, she continued to take Xarelto after her pulmonary embolism pulmonary care was  provided through [facility] pulmonology.  Cardiology had also looked at her CPAP use and stated that she had not been compliant recently.  I   01-06-2022:  looked at her labs it states that she had good cholesterol control, HDL cholesterol is low which would be the good cholesterol and is dependent on physical exercise she does have low triglycerides as well, her fasting glucose was 283 so above normal, creatinine was 0.97 which is excellent so there is no renal function problem.  She does have slightly lower protein content than desired, her ALT liver function enzymes has been above normal at 45.  AST is normal, the last hemoglobin A1c quoted in her referral map from May 23 of this year was 8.8.  Vitamin D is low.  The patient is not anemic.   REVIEW OF SYSTEMS: Out of a complete 14 system review of symptoms, the patient complains only of the following symptoms, chest congestion and all other reviewed systems are negative.  ESS 9/24  ALLERGIES: Allergies  Allergen Reactions   Codeine Itching   Hydrocodone Itching   Oxycodone Rash   Tramadol Rash    HOME MEDICATIONS: Outpatient Medications Prior to Visit  Medication Sig Dispense Refill   albuterol (PROVENTIL) (2.5 MG/3ML) 0.083% nebulizer solution Take 3 mLs (2.5 mg total) by nebulization every 6 (six) hours as needed for wheezing or shortness of breath. 75 mL 12   amLODipine (NORVASC) 5 MG tablet Take 5 mg by mouth daily.     atorvastatin (LIPITOR) 80 MG tablet Take 0.5 tablets (40 mg total) by mouth every evening. 30 tablet 0   Blood Glucose Monitoring Suppl (ONETOUCH VERIO REFLECT) w/Device KIT USE METER AS DIRECTED ONCE DAILY TO CHECK FINGERSTICK GLUCOSE     brimonidine (ALPHAGAN) 0.2 % ophthalmic solution 1 drop 3 (three) times daily.     Cholecalciferol 25 MCG (1000 UT) capsule Take 2 capsules (2,000 Units total) by mouth daily. 60 capsule 2   ciclopirox (PENLAC) 8 % solution Apply topically at bedtime. Apply over nail and surrounding  skin. Apply daily over previous coat. After seven (7) days, may remove with alcohol and continue cycle. 6.6 mL 4   clotrimazole (LOTRIMIN) 1 % cream Apply to affected feet and between toes twice daily for 6 weeks for athlete's feet 60 g 1   dorzolamide-timolol (COSOPT) 22.3-6.8 MG/ML ophthalmic solution 1 drop 2 (two) times daily.     JARDIANCE 10 MG TABS tablet Take 10 mg by mouth daily.     Lancets (ONETOUCH DELICA PLUS LANCET30G) MISC SMARTSIG:Via Meter     losartan (COZAAR) 50 MG tablet Take 1 tablet (50 mg total) by mouth daily. 90 tablet 0   metoprolol succinate (TOPROL-XL) 100 MG 24 hr tablet Take 100 mg by mouth daily. Take with  or immediately following a meal.     ONETOUCH VERIO test strip daily.     prednisoLONE acetate (PRED FORTE) 1 % ophthalmic suspension Place into the right eye.     RESTASIS 0.05 % ophthalmic emulsion      rivaroxaban (XARELTO) 20 MG TABS tablet Take 1 tablet (20 mg total) by mouth daily with supper. 30 tablet 6   furosemide (LASIX) 40 MG tablet Take 1 tablet (40 mg total) by mouth daily. 90 tablet 3   potassium chloride SA (KLOR-CON M20) 20 MEQ tablet Take 1 tablet (20 mEq total) by mouth daily. 90 tablet 3   metoprolol tartrate (LOPRESSOR) 50 MG tablet Take 1 tablet (50 mg total) by mouth 2 (two) times daily. 90 tablet 1   Vitamin D, Ergocalciferol, (DRISDOL) 1.25 MG (50000 UNIT) CAPS capsule Take 1 capsule (50,000 Units total) by mouth every 7 (seven) days. 12 capsule 0   No facility-administered medications prior to visit.    PAST MEDICAL HISTORY: Past Medical History:  Diagnosis Date   Acute meniscal tear of knee    RIGHT   Arthritis    "right knee" (11/21/2012)   Elevated hemoglobin A1c    Exertional shortness of breath    "this week" (11/21/2012)   History of DVT (deep vein thrombosis)    IN PREGNANCY   HTN (hypertension)    CARDIOLOGIST--  DR HOCHREIN-- CLEARANCE NOTE IN EPIC AND CHART FROM 08-21-2012   Mixed hyperlipidemia    Obesity     Pulmonary embolism    "3 today" (11/21/2012)   RBBB (right bundle branch block with left anterior fascicular block)    Seasonal allergies    Sinus tachycardia    Sleep apnea    Vitamin D deficiency     PAST SURGICAL HISTORY: Past Surgical History:  Procedure Laterality Date   IVC filter placed in May 2014 N/A    KNEE ARTHROSCOPY Right 12-09-2002   KNEE ARTHROSCOPY WITH MEDIAL MENISECTOMY Right 10/05/2012   Procedure: KNEE ARTHROSCOPY WITH MEDIAL MENISECTOMY;  Surgeon: Jacki Cones, MD;  Location: [facility];  Service: Orthopedics;  Laterality: Right;   TOTAL ABDOMINAL HYSTERECTOMY W/ BILATERAL SALPINGOOPHORECTOMY  ~ 1986   W/ BILATERAL SALPINGOOPHORECTOMY   TRANSTHORACIC ECHOCARDIOGRAM  11-17-2010   MILD LVH/ EF 60-65%/ GRADE I DIASTOLIC DYSFUNCTION/ MILD LAE / MILD RAE    FAMILY HISTORY: Family History  Problem Relation Age of Onset   Heart attack Father    Hypertension Brother    Diabetes Brother    Stroke Brother    Sudden death Brother    Heart attack Other    Breast cancer Neg Hx    Sleep apnea Neg Hx     SOCIAL HISTORY: Social History   Socioeconomic History   Marital status: Single    Spouse name: Not on file   Number of children: 1   Years of education: Not on file   Highest education level: High school graduate  Occupational History   Occupation: housekeeper    Employer: [employer]  Tobacco Use   Smoking status: Never   Smokeless tobacco: Never  Substance and Sexual Activity   Alcohol use: No   Drug use: No   Sexual activity: Never  Other Topics Concern   Not on file  Social History Narrative   Lives alone   R handed   Caffeine: rare   Social Determinants of Health   Financial Resource Strain: Not on file  Food Insecurity: Not on file  Transportation  Needs: Not on file  Physical Activity: Not on file  Stress: Not on file  Social Connections: Not on file  Intimate Partner Violence: Not on file     PHYSICAL  EXAM  Vitals:   10/04/22 1515  BP: (!) 149/52  Pulse: 71  Weight: 293 lb (132.9 kg)  Height: 5\' 2"  (1.575 m)    Body mass index is 53.59 kg/m.  Generalized: Well developed, in no acute distress  Cardiology: normal rate and rhythm, no murmur noted Respiratory: clear to auscultation bilaterally  Neurological examination  Mentation: Alert oriented to time, place, history taking. Follows all commands speech and language fluent Cranial nerve II-XII: Pupils were equal round reactive to light. Extraocular movements were full, visual field were full  Motor: The motor testing reveals 5 over 5 strength of all 4 extremities. Good symmetric motor tone is noted throughout.  Gait and station: Gait is arthritic     DIAGNOSTIC DATA (LABS, IMAGING, TESTING) - I reviewed patient records, labs, notes, testing and imaging myself where available.      No data to display           Lab Results  Component Value Date   WBC 8.9 07/01/2022   HGB 14.1 07/01/2022   HCT 43.9 07/01/2022   MCV 93.6 07/01/2022   PLT 203 07/01/2022      Component Value Date/Time   NA 139 07/01/2022 1410   NA 142 05/10/2021 1007   K 4.6 07/01/2022 1410   CL 106 07/01/2022 1410   CO2 25 07/01/2022 1410   GLUCOSE 192 (H) 07/01/2022 1410   BUN 26 (H) 07/01/2022 1410   BUN 19 05/10/2021 1007   CREATININE 1.06 (H) 07/01/2022 1410   CREATININE 0.99 08/10/2017 1101   CALCIUM 9.5 07/01/2022 1410   PROT 7.2 07/01/2022 1410   ALBUMIN 3.7 07/01/2022 1410   AST 26 07/01/2022 1410   ALT 34 07/01/2022 1410   ALKPHOS 88 07/01/2022 1410   BILITOT 0.8 07/01/2022 1410   GFRNONAA 57 (L) 07/01/2022 1410   GFRNONAA 60 08/10/2017 1101   GFRAA 69 07/30/2018 1047   GFRAA 70 08/10/2017 1101   Lab Results  Component Value Date   CHOL 168 05/10/2021   HDL 55 05/10/2021   LDLCALC 103 (H) 05/10/2021   TRIG 47 05/10/2021   CHOLHDL 3.1 05/10/2021   Lab Results  Component Value Date   HGBA1C 6.0 (H) 07/22/2018   Lab  Results  Component Value Date   VITAMINB12 556 05/30/2013   Lab Results  Component Value Date   TSH 2.66 03/28/2017     ASSESSMENT AND PLAN 72 y.o. year old female  has a past medical history of Acute meniscal tear of knee, Arthritis, Elevated hemoglobin A1c, Exertional shortness of breath, History of DVT (deep vein thrombosis), HTN (hypertension), Mixed hyperlipidemia, Obesity, Pulmonary embolism, RBBB (right bundle branch block with left anterior fascicular block), Seasonal allergies, Sinus tachycardia, Sleep apnea, and Vitamin D deficiency. here with     ICD-10-CM   1. OSA on CPAP  G47.33 For home use only DME continuous positive airway pressure (CPAP)        AMAAL DESPRES has not used CPAP consistently due to air leak and not having new supplies. She was encouraged to contact [facility], today, to request mask refitting and discuss any billing concerns. I have send orders over to DME as well. We have reviewed insurance requirements for compliance. She was encouraged to continue using CPAP nightly and for greater than 4  hours each night. Risks of untreated sleep apnea review and education materials provided. Healthy lifestyle habits encouraged. She will follow up in 6 months, sooner if needed. She verbalizes understanding and agreement with this plan.    Orders Placed This Encounter  Procedures   For home use only DME continuous positive airway pressure (CPAP)    Mask refitting, currently using nasal mask and reports air leaks around bridge of nose, feels she needs a different size    Order Specific Question:   Length of Need    Answer:   Lifetime    Order Specific Question:   Patient has OSA or probable OSA    Answer:   Yes    Order Specific Question:   Is the patient currently using CPAP in the home    Answer:   Yes    Order Specific Question:   Settings    Answer:   Other see comments    Order Specific Question:   CPAP supplies needed    Answer:   Mask, headgear, cushions,  filters, heated tubing and water chamber     No orders of the defined types were placed in this encounter.     Shawnie Dappermy Anastasia Tompson, FNP-C 10/04/2022, 3:36 PM [facility] [address], [address] [place], [place] [address] [phone]

## 2022-10-14 ENCOUNTER — Other Ambulatory Visit: Payer: Self-pay | Admitting: Family Medicine

## 2022-10-14 DIAGNOSIS — R519 Headache, unspecified: Secondary | ICD-10-CM

## 2022-11-17 ENCOUNTER — Other Ambulatory Visit: Payer: BC Managed Care – PPO

## 2023-01-24 ENCOUNTER — Ambulatory Visit: Payer: BC Managed Care – PPO | Admitting: Podiatry

## 2023-01-24 DIAGNOSIS — Z91199 Patient's noncompliance with other medical treatment and regimen due to unspecified reason: Secondary | ICD-10-CM

## 2023-01-27 ENCOUNTER — Other Ambulatory Visit: Payer: Self-pay | Admitting: Family Medicine

## 2023-01-27 DIAGNOSIS — Z1231 Encounter for screening mammogram for malignant neoplasm of breast: Secondary | ICD-10-CM

## 2023-01-28 NOTE — Progress Notes (Signed)
1. No-show for appointment     

## 2023-02-09 ENCOUNTER — Ambulatory Visit (INDEPENDENT_AMBULATORY_CARE_PROVIDER_SITE_OTHER): Payer: Medicare PPO | Admitting: Podiatry

## 2023-02-09 ENCOUNTER — Encounter: Payer: Self-pay | Admitting: Podiatry

## 2023-02-09 DIAGNOSIS — E119 Type 2 diabetes mellitus without complications: Secondary | ICD-10-CM | POA: Diagnosis not present

## 2023-02-09 DIAGNOSIS — B351 Tinea unguium: Secondary | ICD-10-CM

## 2023-02-09 DIAGNOSIS — M79675 Pain in left toe(s): Secondary | ICD-10-CM

## 2023-02-09 DIAGNOSIS — M79674 Pain in right toe(s): Secondary | ICD-10-CM | POA: Diagnosis not present

## 2023-02-09 NOTE — Progress Notes (Signed)
This patient returns to my office for at risk foot care.  This patient requires this care by a professional since this patient will be at risk due to having coagulation defect.  This patient is unable to cut nails herself since the patient cannot reach her nails.These nails are painful walking and wearing shoes.  This patient presents for at risk foot care today.  General Appearance  Alert, conversant and in no acute stress.  Vascular  Dorsalis pedis and posterior tibial  pulses are palpable  bilaterally.  Capillary return is within normal limits  bilaterally. Temperature is within normal limits  bilaterally.  Neurologic  Senn-Weinstein monofilament wire test within normal limits  bilaterally. Muscle power within normal limits bilaterally.  Nails Thick disfigured discolored nails with subungual debris  from hallux to fifth toes bilaterally. No evidence of bacterial infection or drainage bilaterally.  Orthopedic  No limitations of motion  feet .  No crepitus or effusions noted.  No bony pathology or digital deformities noted.  Skin  normotropic skin with no porokeratosis noted bilaterally.  No signs of infections or ulcers noted.     Onychomycosis  Pain in right toes  Pain in left toes  Consent was obtained for treatment procedures.   Mechanical debridement of nails 1-5  bilaterally performed with a nail nipper.  Filed with dremel without incident.    Return office visit     3 months                 Told patient to return for periodic foot care and evaluation due to potential at risk complications.   Gardiner Barefoot DPM

## 2023-04-11 ENCOUNTER — Ambulatory Visit: Payer: BC Managed Care – PPO | Admitting: Family Medicine

## 2023-05-16 ENCOUNTER — Ambulatory Visit: Payer: BC Managed Care – PPO | Admitting: Podiatry

## 2023-05-23 ENCOUNTER — Ambulatory Visit: Payer: BC Managed Care – PPO | Admitting: Neurology

## 2023-06-15 ENCOUNTER — Ambulatory Visit: Payer: BC Managed Care – PPO | Admitting: Neurology

## 2023-08-07 ENCOUNTER — Ambulatory Visit: Payer: Medicare (Managed Care) | Admitting: Neurology

## 2023-08-28 ENCOUNTER — Encounter: Payer: Self-pay | Admitting: Family Medicine

## 2023-08-28 DIAGNOSIS — Z1231 Encounter for screening mammogram for malignant neoplasm of breast: Secondary | ICD-10-CM

## 2023-10-04 ENCOUNTER — Telehealth: Payer: Self-pay

## 2023-10-04 ENCOUNTER — Other Ambulatory Visit: Payer: Self-pay

## 2023-10-04 NOTE — Patient Outreach (Signed)
 Aging Gracefully Program  10/04/2023  Kerry Liu 1951/05/29 [id number]   [facility] Evaluation Interviewer made contact with patient. Aging Gracefully survey completed.   Interviewer will send referral to RN and OT for follow up.    [facility]  Population Health Care Management Assistant  Direct Dial: [phone]  Fax: [phone] Website: [email or web address]

## 2023-10-20 ENCOUNTER — Encounter: Payer: Self-pay | Admitting: Specialist

## 2023-10-23 ENCOUNTER — Other Ambulatory Visit: Payer: Self-pay | Admitting: Specialist
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
## 2023-10-25 ENCOUNTER — Ambulatory Visit: Payer: Medicare (Managed Care) | Admitting: Podiatry

## 2023-10-26 ENCOUNTER — Other Ambulatory Visit: Payer: Self-pay | Admitting: *Deleted

## 2023-10-26 NOTE — Patient Outreach (Signed)
 Aging Gracefully Program  10/26/2023  Kerry Liu 11-29-1950 [id number]  Telephone call made to Kerry Liu to schedule Aging Gracefully RN home visit #1. Kerry Liu agreeable to Friday, May 2nd at 10 am.   Nolberto Batty, MSN, RN, BSN [place]  [facility], Healthy Communities RN Case Manager for Aging Gracefully Direct Dial: [phone]

## 2023-10-27 ENCOUNTER — Other Ambulatory Visit: Payer: Self-pay | Admitting: *Deleted

## 2023-10-27 ENCOUNTER — Encounter: Payer: Self-pay | Admitting: *Deleted

## 2023-10-27 NOTE — Patient Instructions (Signed)
 Visit Information  Thank you for taking time to visit with me today. Please don't hesitate to contact me if I can be of assistance to you before our next scheduled home appointment.  Following are the goals we discussed today:  CLIENT/RN ACTION PLAN - FALL PREVENTION  Registered Nurse:   Date:  Client Name: Client ID:    Target Area:  FALL PREVENTION    Why Problem May Occur:    Target Goal:   STRATEGIES Coping Strategies Ideas  Change Positions Slowly: lying to sitting, sitting to standing Changing positions can make people lightheaded. When getting up in the morning sit for a few minutes, before standing.  Stand for a few minutes before walking and hold on to sturdy furniture or countertop.    Drink water Dehydration can make people dizzy. Ask your healthcare provider how much water you can drink. Decrease caffeine, caffeine makes you urinate a lot, which can make you dehydrated.   If you fall, tell someone Tell a friend or family member even if you didn't get hurt. Tell your healthcare provider if you fall.  They can help you figure out why.   Get your vision and hearing checked Poor vision and hearing loss can make people fall. Glaucoma and diabetes can cause poor vision.   Other    Prevention Ideas  Review your Medicines Many medicines can make you dizzy or sleepy and increase your risk of falling. Your Aging Gracefully Nurse will look at your medications and see if you are taking any that might cause falling.   Activity and Exercise Aging Gracefully exercises Walking (inside or outside) [employer]:  cooking, cleaning, laundry Exercise while watching TV Swimming or water aerobics.   Strengthen Bones Exercise makes your bones stronger. Vitamin D  and Calcium  make your bones stronger.  Ask your Healthcare provider if you should be taking them. Your body makes vitamin D  from the sun.  Sit in the sun for 10 minutes every day (without sunscreen).    Control your urine  Prevent constipation Cut back on caffeinated drinks. Don't wait to urinate. If diabetic, control your blood sugar. Ask your Aging Gracefully Nurse and Healthcare Provider  about urine control.   Control your blood sugar (if you are diabetic) High blood sugar can cause frequent urination, poor vision, and numbness in your feet, which can make you fall.   Low blood sugar can cause confusion and dizziness.   Other coping strategies 1. 2. 3. 4. 5.   PRACTICE It is important to practice the strategies so we can determine if they will be effective in helping to reach the goal.    Follow these specific recommendations:        If strategy does not work the first time, try it again.     We may make some changes over the next few sessions.       Our next appointment is on June 17th at 10 am.  If you are experiencing a Mental Health or Behavioral Health Crisis or need someone to talk to, please call the Suicide and Crisis Lifeline: 988 call the [place]  National Suicide Prevention Lifeline: 684-876-8188 or TTY: 503-767-5172 TTY 480-719-0398) to talk to a trained counselor call 1-800-273-TALK (toll free, 24 hour hotline) go to [facility] [address] [place] [phone]) call 911   The patient verbalized understanding of instructions, educational materials, and care plan provided today and agreed to receive a mailed copy of patient instructions, educational  materials, and care plan.   Nolberto Batty, MSN, RN, BSN [place]  [facility], Healthy Communities RN Case Manager for Aging Gracefully Direct Dial: [phone]

## 2023-10-27 NOTE — Patient Outreach (Signed)
 Aging Gracefully Program  RN Visit  10/27/2023  Kerry Liu 03-18-1951 [id number]  Visit:  RN Visit Number: 1- Initial Visit  RN TIME CALCULATION: Start TIme:  RN Start Time Calculation: 1000 End Time:  RN Stop Time Calculation: 1420 Total Minutes:  RN Time Calculation: 260  Readiness To Change Score:  Readiness to Change Score: 9  Universal RN Interventions: Calendar Distribution: Yes Exercise Review: No Medications: Yes Medication Changes: Yes Mood: Yes Pain: Yes PCP Advocacy/Support: Yes Fall Prevention: Yes Incontinence: Yes Clinician View Of Client Situation: Arrived for home visit. Home with distinct odor. Kerry Liu sitting in chair. Has rollator in front of her. Denies falls or pain. Client View Of His/Her Situation: Client in need of community resources and support.  Healthcare Provider Communication: Did Surveyor, mining With [employer] Provider?: Yes Method Of Communication: Telephone Healthcare Provider Response According to RN: Left message for Dr. Elnoria Hails to request HHC RN, PT,OT, RN, HHA and bedside commode According to Client, Did PCP Report Communication With An Aging Gracefully RN?: No  Clinician View of Client Situation: Diplomatic Services operational officer Of Client Situation: Arrived for home visit. Home with distinct odor. Kerry Liu sitting in chair. Has rollator in front of her. Denies falls or pain. Client's View of His/Her Situation: Client View Of His/Her Situation: Client in need of community resources and support.  Medication Assessment: Do You Have Any Problems Paying For Medications?: No (unable to afford eliquis  and jardiance) Where Does Client Store Medications?: Other: (keep in rollator compartment) Can Client Read Pill Bottles?: No (iliterate- has pill packs) Does Client Use A Pillbox?: No (pill pack) Does Anyone Assist Client In Taking Medications?: No Do You Take Vitamin D ?: No Does Client Have Any Questions Or Concerns About Medictions?: No Is  Client Complaining Of Any Symptoms That Could Be Side Effects To Medications?: No Any Possible Changes In Medication Regimen?: Yes  OT Update: Pending [facility] contracts and assessment.  Session Summary: Kerry Liu in good spirits despite her current circumstances. She has been trying earnestly to receive additional assistance in the home without success.     Goals Addressed               This Visit's Progress     AG RN (pt-stated)          CLIENT/RN ACTION PLAN - FALL PREVENTION  Registered Nurse:  Kerry Liu Date: 10/27/23  Client Name:  Kerry Liu Client ID:    Target Area:  FALL PREVENTION    Why Problem May Occur: loss of balance    Target Goal: No falls over the next 160 days   STRATEGIES Coping Strategies Ideas  Change Positions Slowly: lying to sitting, sitting to standing  Reviewed 10/27/23 Changing positions can make people lightheaded. When getting up in the morning sit for a few minutes, before standing.  Stand for a few minutes before walking and hold on to sturdy furniture or countertop.    Drink water Reviewed 10/27/23 Dehydration can make people dizzy. Ask your healthcare provider how much water you can drink. Decrease caffeine, caffeine makes you urinate a lot, which can make you dehydrated.   If you fall, tell someone Reviewed 10/27/23  Tell a friend or family member even if you didn't get hurt. Tell your healthcare provider if you fall.  They can help you figure out why.   Get your vision and hearing checked Reviewed 10/27/23 Poor vision and hearing loss can make people fall. Glaucoma and diabetes can cause poor vision.  Other    Prevention Ideas  Review your Medicines  Reviewed 10/27/23 Many medicines can make you dizzy or sleepy and increase your risk of falling. Your Aging Gracefully Nurse will look at your medications and see if you are taking any that might cause falling.   Activity and Exercise Reviewed 10/27/23 Aging Gracefully exercises Walking  (inside or outside) [employer]:  cooking, cleaning, laundry Exercise while watching TV Swimming or water aerobics.   Strengthen Bones Reviewed 10/27/23 Exercise makes your bones stronger. Vitamin D  and Calcium  make your bones stronger.  Ask your Healthcare provider if you should be taking them. Your body makes vitamin D  from the sun.  Sit in the sun for 10 minutes every day (without sunscreen).   Control your urine  Reviewed 10/27/23 Prevent constipation Cut back on caffeinated drinks. Don't wait to urinate. If diabetic, control your blood sugar. Ask your Aging Gracefully Nurse and Healthcare Provider  about urine control.   Control your blood sugar (if you are diabetic) High blood sugar can cause frequent urination, poor vision, and numbness in your feet, which can make you fall.   Low blood sugar can cause confusion and dizziness.   Other coping strategies 1. 2. 3. 4. 5.   PRACTICE It is important to practice the strategies so we can determine if they will be effective in helping to reach the goal.    Follow these specific recommendations:        If strategy does not work the first time, try it again.     We may make some changes over the next few sessions.        10/27/23  Assessment: [name] lives alone. 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 Uses multiple eye drops. Ms. Hennessey is unable to read. Reports she is unable see to prepare meals. Uses microwave for meals and eats sandwiches. Receives 23 dollars in food stamps and receives food voucher of 50 dollars a month from [facility].  States medications are provided by mail pharmacy [facility] in pill packs. Recently unable to afford Eliquis . States PCP office is working on patient assistance application for Eliquis . Reports her friend Kerry Liu  assists with medication management. Reports she uses [employer] for transportation. States she only has 2 transportation passes left until  the beginning of September. States she goes to [facility] for PCP because they provide transportation services to PCP appointments. States family nor church members assist. States her friend Kerry Liu  assists weekly or so. Denies any falls. Uses rollator. Ms. Dacko goes to [facility]. Therefore, she is not eligible for [facility] CCM services.   Interventions: Discussed fall precautions. #1 Called to leave message for PCP Dr. Elnoria Hails with [facility] to request PCP order home health PT/OT/RN/SW/aide and bedside commode. #2 Left message for SW at [facility] to Higher education careers adviser to discuss Ms. Grantz's needs. #3 Contacted DSS to inquire about Ms. Dales reapplying for Medicaid as she previously had Medicaid x 8 yrs and was informed 1.5 years ago that she no longer qualified. Found out [name] remains over the limit to qualify for any type of Medicaid assistance per DSS. #4 Collaborated with [facility] about Ms. Weant's medications. #5 Left message for nurse at [facility] to inquire about Eliquis  pharmacy assistance application. Ms. Siverson said PCP office was working on applying for assistance program. Provided writer's contact information. Left chronic disease booklet for friend Kerry Liu  to review. Encouraged Ms. Schlichte to ask her friend Kerry Liu  to check  her blood sugars.   Plan: Scheduled next home visit for June 17th at 10 am.   Kerry Batty, MSN, RN, BSN [place]  [facility], Healthy Communities RN Case Manager for Aging Gracefully Direct Dial: [phone]                                    Kerry Batty, MSN, RN, BSN [place]  [facility], Healthy Communities RN Case Manager for Aging Gracefully Direct Dial: [phone]

## 2023-10-30 ENCOUNTER — Other Ambulatory Visit: Payer: Self-pay | Admitting: *Deleted

## 2023-10-30 NOTE — Patient Outreach (Signed)
 Aging Gracefully Program  10/30/2023  PRAGNA SCHLINGER 01/15/1951 [id number]   Spoke with Ms. Trojan to confirm she scheduled PCP follow up. Ms. Conwill states her PCP appointment with [facility] is on this Thursday. PCP office provides transportation to [facility]. Confirmed with Carmer that she has had a recent eye exam. Confirmed primary contact/support person is her friend [name]  Shepard at [phone] (cell) and [phone] (home). Ms. Siemen gave writer permission to contact [name] .   [name]  confirms she assists Ms. Hyder with medications and the like. Reports she visits Ms. Hounshell every week and as needed. Confirmed eye doctor is Dr. Candi Chafe. Discussed Clinical research associate will follow back up with [facility] social workers to inquire about making a referral. Beth, AG OT has previously reached out to [facility]. Support person contact information was needed as well as recent eye exam. Ms. Kunze endorses her current glasses are not helping as much as they were. Writer encouraged her to contact Dr. Marvin Slot office to discuss.  Writer left voicemail messages for both social workers for [employer] [facility] to request return call to discuss how to proceed with referral.   Will await return call. Will update [name] as appropriate.   Nolberto Batty, MSN, RN, BSN [place]  [facility], Healthy Communities RN Case Manager for Aging Gracefully Direct Dial: [phone]

## 2023-10-30 NOTE — Patient Outreach (Signed)
 Aging Gracefully Program  10/30/2023  JAQUASIA SMEE 11/27/1950 [id number]  Late entry for 10/27/23   Telephone call received from Detra, social worker with [facility], Ms. Hutto's PCP office. Detra reports she has been working with Ms. Hesterberg over the past 2 years on all of things writer was trying to assist Ms. Guinn with. Detra reports [employer] has denied home health requests on multiple occasions. States they can try again to order. States Ms. Mccannon will need to be seen by PCP in order for home health orders to be placed again. States last PCP visit was in March.   Writer called [name] to make her aware of conversation with SW at PCP office. Advised Ms. Gobin to schedule PCP visit.  Nolberto Batty, MSN, RN, BSN [place]  [facility], [facility] RN Case Manager for Aging Gracefully Direct Dial: [phone]

## 2023-10-31 ENCOUNTER — Ambulatory Visit: Admitting: Podiatry

## 2023-10-31 ENCOUNTER — Encounter: Payer: Self-pay | Admitting: Podiatry

## 2023-10-31 VITALS — Ht 62.0 in | Wt 293.0 lb

## 2023-10-31 DIAGNOSIS — B351 Tinea unguium: Secondary | ICD-10-CM

## 2023-10-31 DIAGNOSIS — M79675 Pain in left toe(s): Secondary | ICD-10-CM | POA: Diagnosis not present

## 2023-10-31 DIAGNOSIS — M79674 Pain in right toe(s): Secondary | ICD-10-CM

## 2023-10-31 DIAGNOSIS — E119 Type 2 diabetes mellitus without complications: Secondary | ICD-10-CM

## 2023-10-31 NOTE — Progress Notes (Signed)
  Subjective:  Patient ID: Kerry Liu, female    DOB: 19-Feb-1951,  MRN: [id number]  73 y.o. female presents preventative diabetic foot care and painful, elongated thickened toenails x 10 which are symptomatic when wearing enclosed shoe gear. This interferes with his/her daily activities. Chief Complaint  Patient presents with   Nail Problem    Pt is here for [facility] last A1C was 6.2 PCP is Dr Rito Chess and LOV was a few weeks ago.   New problem(s): None   PCP is Paliwal, Himanshu, MD.  Allergies  Allergen Reactions   Codeine  Itching   Hydrocodone Itching   Oxycodone Rash   Tramadol Rash    Review of Systems: Negative except as noted in the HPI.   Objective:  Kerry Liu is a pleasant 73 y.o. female morbidly obese in NAD. AAO x 3.  Vascular Examination: Vascular status intact b/l with palpable pedal pulses. CFT immediate b/l. Pedal hair present. No edema. No pain with calf compression b/l. Skin temperature gradient WNL b/l. No varicosities noted. No cyanosis or clubbing noted.  Neurological Examination: Sensation grossly intact b/l with 10 gram monofilament. Vibratory sensation intact b/l.  Dermatological Examination: Pedal skin with normal turgor, texture and tone b/l. No open wounds nor interdigital macerations noted. Toenails 1-5 b/l thick, discolored, elongated with subungual debris and pain on dorsal palpation. No hyperkeratotic lesions noted b/l.   Musculoskeletal Examination: Muscle strength 5/5 to b/l LE.  No pain, crepitus noted b/l. No gross pedal deformities. Utilizes rollator for ambulation assistance.  Radiographs: None  Last A1c:       No data to display         Assessment:   1. Pain due to onychomycosis of toenails of both feet   2. Controlled type 2 diabetes mellitus without complication, without long-term current use of insulin  (HCC)    Plan:  Consent given for treatment. Patient examined. All patient's and/or POA's questions/concerns addressed on  today's visit. Toenails 1-5 debrided in length and girth without incident. Continue foot and shoe inspections daily. Monitor blood glucose per PCP/Endocrinologist's recommendations. Continue soft, supportive shoe gear daily. Report any pedal injuries to medical professional. Call office if there are any questions/concerns. -Patient/POA to call should there be question/concern in the interim.  Return in about 3 months (around 01/31/2024).  Kerry Liu, DPM      [place] LOCATION: [address], [place] [address]                   Office [phone]   [facility] LOCATION: [address] [place], [place] [address] Office [phone]

## 2023-11-19 ENCOUNTER — Encounter (HOSPITAL_COMMUNITY): Payer: Self-pay | Admitting: Emergency Medicine

## 2023-11-19 ENCOUNTER — Emergency Department (HOSPITAL_COMMUNITY)
Admission: EM | Admit: 2023-11-19 | Discharge: 2023-11-20 | Disposition: A | Discharge status: Home / Self Care | Attending: Emergency Medicine | Admitting: Emergency Medicine

## 2023-11-19 ENCOUNTER — Emergency Department (HOSPITAL_COMMUNITY)

## 2023-11-19 ENCOUNTER — Other Ambulatory Visit: Payer: Self-pay

## 2023-11-19 DIAGNOSIS — E119 Type 2 diabetes mellitus without complications: Secondary | ICD-10-CM | POA: Diagnosis not present

## 2023-11-19 DIAGNOSIS — J45909 Unspecified asthma, uncomplicated: Secondary | ICD-10-CM | POA: Insufficient documentation

## 2023-11-19 DIAGNOSIS — Z7901 Long term (current) use of anticoagulants: Secondary | ICD-10-CM | POA: Insufficient documentation

## 2023-11-19 DIAGNOSIS — Z79899 Other long term (current) drug therapy: Secondary | ICD-10-CM | POA: Insufficient documentation

## 2023-11-19 DIAGNOSIS — Z7951 Long term (current) use of inhaled steroids: Secondary | ICD-10-CM | POA: Diagnosis not present

## 2023-11-19 DIAGNOSIS — R7989 Other specified abnormal findings of blood chemistry: Secondary | ICD-10-CM | POA: Diagnosis not present

## 2023-11-19 DIAGNOSIS — I110 Hypertensive heart disease with heart failure: Secondary | ICD-10-CM | POA: Diagnosis not present

## 2023-11-19 DIAGNOSIS — M79605 Pain in left leg: Secondary | ICD-10-CM | POA: Insufficient documentation

## 2023-11-19 DIAGNOSIS — Z7952 Long term (current) use of systemic steroids: Secondary | ICD-10-CM | POA: Diagnosis not present

## 2023-11-19 DIAGNOSIS — M79662 Pain in left lower leg: Secondary | ICD-10-CM

## 2023-11-19 DIAGNOSIS — J40 Bronchitis, not specified as acute or chronic: Secondary | ICD-10-CM

## 2023-11-19 DIAGNOSIS — Z7984 Long term (current) use of oral hypoglycemic drugs: Secondary | ICD-10-CM | POA: Diagnosis not present

## 2023-11-19 DIAGNOSIS — R609 Edema, unspecified: Secondary | ICD-10-CM | POA: Diagnosis present

## 2023-11-19 DIAGNOSIS — Z7982 Long term (current) use of aspirin: Secondary | ICD-10-CM | POA: Insufficient documentation

## 2023-11-19 DIAGNOSIS — I509 Heart failure, unspecified: Secondary | ICD-10-CM | POA: Diagnosis not present

## 2023-11-19 LAB — CBC WITH DIFFERENTIAL/PLATELET
Abs Immature Granulocytes: 0.03 10*3/uL (ref 0.00–0.07)
Basophils Absolute: 0.1 10*3/uL (ref 0.0–0.1)
Basophils Relative: 1 %
Eosinophils Absolute: 0.3 10*3/uL (ref 0.0–0.5)
Eosinophils Relative: 3 %
HCT: 42.3 % (ref 36.0–46.0)
Hemoglobin: 13.1 g/dL (ref 12.0–15.0)
Immature Granulocytes: 0 %
Lymphocytes Relative: 15 %
Lymphs Abs: 1.2 10*3/uL (ref 0.7–4.0)
MCH: 29.8 pg (ref 26.0–34.0)
MCHC: 31 g/dL (ref 30.0–36.0)
MCV: 96.4 fL (ref 80.0–100.0)
Monocytes Absolute: 0.6 10*3/uL (ref 0.1–1.0)
Monocytes Relative: 8 %
Neutro Abs: 5.7 10*3/uL (ref 1.7–7.7)
Neutrophils Relative %: 73 %
Platelets: 200 10*3/uL (ref 150–400)
RBC: 4.39 MIL/uL (ref 3.87–5.11)
RDW: 12.3 % (ref 11.5–15.5)
WBC: 7.9 10*3/uL (ref 4.0–10.5)
nRBC: 0 % (ref 0.0–0.2)

## 2023-11-19 LAB — I-STAT CHEM 8, ED
BUN: 36 mg/dL — ABNORMAL HIGH (ref 8–23)
Calcium, Ion: 1.21 mmol/L (ref 1.15–1.40)
Chloride: 108 mmol/L (ref 98–111)
Creatinine, Ser: 1.5 mg/dL — ABNORMAL HIGH (ref 0.44–1.00)
Glucose, Bld: 81 mg/dL (ref 70–99)
HCT: 38 % (ref 36.0–46.0)
Hemoglobin: 12.9 g/dL (ref 12.0–15.0)
Potassium: 4.5 mmol/L (ref 3.5–5.1)
Sodium: 144 mmol/L (ref 135–145)
TCO2: 25 mmol/L (ref 22–32)

## 2023-11-19 LAB — BASIC METABOLIC PANEL WITH GFR
Anion gap: 9 (ref 5–15)
BUN: 36 mg/dL — ABNORMAL HIGH (ref 8–23)
CO2: 25 mmol/L (ref 22–32)
Calcium: 9 mg/dL (ref 8.9–10.3)
Chloride: 108 mmol/L (ref 98–111)
Creatinine, Ser: 1.38 mg/dL — ABNORMAL HIGH (ref 0.44–1.00)
GFR, Estimated: 41 mL/min — ABNORMAL LOW (ref >60)
Glucose, Bld: 94 mg/dL (ref 70–99)
Potassium: 4.3 mmol/L (ref 3.5–5.1)
Sodium: 142 mmol/L (ref 135–145)

## 2023-11-19 LAB — BRAIN NATRIURETIC PEPTIDE: B Natriuretic Peptide: 29.5 pg/mL (ref 0.0–100.0)

## 2023-11-19 MED ORDER — SODIUM CHLORIDE (PF) 0.9 % IJ SOLN
INTRAMUSCULAR | Status: AC
  Filled 2023-11-19: qty 50

## 2023-11-19 MED ORDER — IOHEXOL 350 MG/ML SOLN
75.0000 mL | Freq: Once | INTRAVENOUS | Status: AC | PRN
  Administered 2023-11-20: 75 mL via INTRAVENOUS

## 2023-11-19 NOTE — ED Provider Notes (Signed)
 [place] EMERGENCY DEPARTMENT AT [facility] Provider Note   CSN: [id number] Arrival date & time: 11/19/23  2147     History {Add pertinent medical, surgical, social history, OB history to HPI:1} Chief Complaint  Patient presents with   Leg Pain    Kerry Liu is a 73 y.o. female.  73 year old female presents with complaint of pain in her left calf which she noticed earlier this morning when she woke up to go to the bathroom.  Calf pain persisted throughout the day, for the past hour she has had very mild shortness of breath.  She denies any chest pain.  She has a history of PE and DVT.  She was previously on Xarelto  but had to stop the medication when it became unaffordable.  Denies fevers, chills, cough or congestion.  No other complaints or concerns today.       Home Medications Prior to Admission medications   Medication Sig Start Date End Date Taking? Authorizing Provider  albuterol  (PROVENTIL ) (2.5 MG/3ML) 0.083% nebulizer solution Take 3 mLs (2.5 mg total) by nebulization every 6 (six) hours as needed for wheezing or shortness of breath. 07/01/16   Edmonia Gottron, PA-C  amLODipine  (NORVASC ) 5 MG tablet Take 5 mg by mouth daily. Patient not taking: Reported on 10/31/2023 12/03/21   [provider]  aspirin  EC 81 MG tablet Take 81 mg by mouth daily. Swallow whole.    [provider]  atorvastatin  (LIPITOR) 80 MG tablet Take 0.5 tablets (40 mg total) by mouth every evening. Patient not taking: Reported on 10/27/2023 03/28/19   Samella Crete, MD  Blood Glucose Monitoring Suppl (ONETOUCH VERIO REFLECT) w/Device KIT USE METER AS DIRECTED ONCE DAILY TO CHECK FINGERSTICK GLUCOSE 11/03/21   [provider]  brimonidine (ALPHAGAN) 0.2 % ophthalmic solution 1 drop 3 (three) times daily. 11/02/21   [provider]  Cholecalciferol  25 MCG (1000 UT) capsule Take 2 capsules (2,000 Units total) by mouth daily. Patient not taking: Reported  on 10/27/2023 01/10/19   Samella Crete, MD  ciclopirox  (PENLAC ) 8 % solution Apply topically at bedtime. Apply over nail and surrounding skin. Apply daily over previous coat. After seven (7) days, may remove with alcohol and continue cycle. Patient not taking: Reported on 10/27/2023 10/28/19   Charity Conch, DPM  clotrimazole  (LOTRIMIN ) 1 % cream Apply to affected feet and between toes twice daily for 6 weeks for athlete's feet 08/06/21   Galaway, Nancyann Aye, DPM  dorzolamide-timolol (COSOPT) 22.3-6.8 MG/ML ophthalmic solution 1 drop 2 (two) times daily. 11/02/21   [provider]  furosemide  (LASIX ) 40 MG tablet Take 1 tablet (40 mg total) by mouth daily. 05/10/21 08/17/21  Tobb, Kardie, DO  furosemide  (LASIX ) 40 MG tablet Take 40 mg by mouth.    [provider]  JARDIANCE 10 MG TABS tablet Take 10 mg by mouth daily. Patient not taking: Reported on 10/31/2023 11/05/21   [provider]  Lancets [facility] DELICA PLUS LANCET30G) MISC SMARTSIG:Via Meter 11/05/21   [provider]  losartan  (COZAAR ) 100 MG tablet Take 100 mg by mouth daily.    [provider]  losartan  (COZAAR ) 50 MG tablet Take 1 tablet (50 mg total) by mouth daily. Patient not taking: Reported on 10/27/2023 03/28/19   Samella Crete, MD  metformin (FORTAMET) 500 MG (OSM) 24 hr tablet Take 500 mg by mouth daily with breakfast.    [provider]  metoprolol  succinate (TOPROL -XL) 100 MG 24 hr tablet Take 100  mg by mouth daily. Take with or immediately following a meal. Patient not taking: Reported on 10/31/2023    [provider]  [facility] VERIO test strip daily. 11/03/21   [provider]  potassium chloride  SA (KLOR-CON  M) 20 MEQ tablet Take 20 mEq by mouth daily.    [provider]  potassium chloride  SA (KLOR-CON  M20) 20 MEQ tablet Take 1 tablet (20 mEq total) by mouth daily. 05/10/21 08/17/21  Tobb, Kardie, DO  prednisoLONE acetate (PRED FORTE) 1 %  ophthalmic suspension Place into the right eye. 11/29/21   [provider]  RESTASIS 0.05 % ophthalmic emulsion  09/02/21   [provider]  rivaroxaban  (XARELTO ) 20 MG TABS tablet Take 1 tablet (20 mg total) by mouth daily with supper. Patient not taking: Reported on 10/27/2023 03/28/19   Santos-Sanchez, Idalys, MD  rOPINIRole (REQUIP) 1 MG tablet Take 1 mg by mouth 3 (three) times daily.    [provider]      Allergies    Codeine , Hydrocodone, Oxycodone, and Tramadol    Review of Systems   Review of Systems Negative except as per HPI Physical Exam Updated Vital Signs BP 114/83 (BP Location: Left Arm)   Pulse 83   Temp 98.1 F (36.7 C) (Oral)   Resp 18   Ht 5\' 2"  (1.575 m)   Wt 108.9 kg   SpO2 98%   BMI 43.90 kg/m  Physical Exam Vitals and nursing note reviewed.  Constitutional:      General: She is not in acute distress.    Appearance: She is well-developed. She is not diaphoretic.  HENT:     Head: Normocephalic and atraumatic.  Cardiovascular:     Rate and Rhythm: Normal rate and regular rhythm.     Pulses: Normal pulses.     Heart sounds: Normal heart sounds.  Pulmonary:     Effort: Pulmonary effort is normal.     Breath sounds: Normal breath sounds.  Musculoskeletal:        General: Tenderness present. No swelling.     Right lower leg: No edema.     Left lower leg: Normal. No edema.     Comments: Mild TTP left calf, no erythema, no palpable cords  Skin:    General: Skin is warm and dry.  Neurological:     Mental Status: She is alert and oriented to person, place, and time.     Sensory: No sensory deficit.     Motor: No weakness.  Psychiatric:        Behavior: Behavior normal.     ED Results / Procedures / Treatments   Labs (all labs ordered are listed, but only abnormal results are displayed) Labs Reviewed - No data to display  EKG None  Radiology No results found.  Procedures Procedures  {Document cardiac monitor,  telemetry assessment procedure when appropriate:1}  Medications Ordered in ED Medications - No data to display  ED Course/ Medical Decision Making/ A&P   {   Click here for ABCD2, HEART and other calculatorsREFRESH Note before signing :1}                              Medical Decision Making Amount and/or Complexity of Data Reviewed Labs: ordered. Radiology: ordered.   This patient presents to the ED for concern of ***, this involves an extensive number of treatment options, and is a complaint that carries with it a high risk of  complications and morbidity.  The differential diagnosis includes ***   Co morbidities / Chronic conditions that complicate the patient evaluation  ***   Additional history obtained:  Additional history obtained from EMR External records from outside source obtained and reviewed including ***   Lab Tests:  I Ordered, and personally interpreted labs.  The pertinent results include:  ***   Imaging Studies ordered:  I ordered imaging studies including ***  I independently visualized and interpreted imaging which showed *** I agree with the radiologist interpretation   Cardiac Monitoring: / EKG:  The patient was maintained on a cardiac monitor.  I personally viewed and interpreted the cardiac monitored which showed an underlying rhythm of: ***   Problem List / ED Course / Critical interventions / Medication management  *** I ordered medication including ***   Reevaluation of the patient after these medicines showed that the patient *** I have reviewed the patients home medicines and have made adjustments as needed   Consultations Obtained:  I requested consultation with the ***,  and discussed lab and imaging findings as well as pertinent plan - they recommend: ***   Social Determinants of Health:  ***   Test / Admission - Considered:  ***   {Document critical care time when appropriate:1} {Document review of labs and clinical  decision tools ie heart score, Chads2Vasc2 etc:1}  {Document your independent review of radiology images, and any outside records:1} {Document your discussion with family members, caretakers, and with consultants:1} {Document social determinants of health affecting pt's care:1} {Document your decision making why or why not admission, treatments were needed:1} Final Clinical Impression(s) / ED Diagnoses Final diagnoses:  None    Rx / DC Orders ED Discharge Orders     None

## 2023-11-19 NOTE — ED Triage Notes (Addendum)
 Patient presents due to swelling and pain in the left leg. MD told her to cone to the ED due to concerns of blood clots. She has a history of blood clots. Recently, she was taken off of her blood thinner. Patient also complains of shortness of breath which started this evening.

## 2023-11-20 ENCOUNTER — Ambulatory Visit (HOSPITAL_BASED_OUTPATIENT_CLINIC_OR_DEPARTMENT_OTHER)
Admission: RE | Admit: 2023-11-20 | Discharge: 2023-11-20 | Disposition: A | Discharge status: Home / Self Care | Source: Ambulatory Visit | Attending: Emergency Medicine | Admitting: Emergency Medicine

## 2023-11-20 ENCOUNTER — Ambulatory Visit (HOSPITAL_COMMUNITY)

## 2023-11-20 ENCOUNTER — Emergency Department (HOSPITAL_COMMUNITY)

## 2023-11-20 DIAGNOSIS — M79605 Pain in left leg: Secondary | ICD-10-CM | POA: Diagnosis not present

## 2023-11-20 DIAGNOSIS — R609 Edema, unspecified: Secondary | ICD-10-CM | POA: Insufficient documentation

## 2023-11-20 LAB — TROPONIN I (HIGH SENSITIVITY): Troponin I (High Sensitivity): 4 ng/L (ref <18)

## 2023-11-20 MED ORDER — ENOXAPARIN SODIUM 120 MG/0.8ML IJ SOSY
1.0000 mg/kg | PREFILLED_SYRINGE | Freq: Once | INTRAMUSCULAR | Status: AC
  Administered 2023-11-20: 108 mg via SUBCUTANEOUS
  Filled 2023-11-20: qty 0.72

## 2023-11-20 MED ORDER — DOXYCYCLINE HYCLATE 100 MG PO CAPS: 100.0000 mg | ORAL_CAPSULE | Freq: Two times a day (BID) | ORAL | 0 refills | Status: AC

## 2023-11-20 NOTE — Progress Notes (Signed)
 VASCULAR LAB    Left lower extremity venous duplex has been performed.  See CV proc for preliminary results.   Sirius Woodford, RVT 11/20/2023, 3:09 PM

## 2023-11-20 NOTE — Discharge Instructions (Addendum)
 IMPORTANT PATIENT INSTRUCTIONS: You have been scheduled for an Outpatient Vascular Study at [facility].  If tomorrow is a Saturday, Sunday or holiday, please go to the [facility] Emergency Department Registration Desk at 11 am tomorrow morning and tell them you are there for a vascular study.  If tomorrow is a weekday (Monday-Friday), please go to the [facility] (address [address], [place]) at 8 am and report to the 4th floor registration Zone A.  Inform registration that you are there for a vascular study.   Take antibiotics as prescribed and complete the full course.

## 2023-11-22 ENCOUNTER — Other Ambulatory Visit: Payer: Self-pay | Admitting: Specialist

## 2023-11-24 NOTE — Patient Outreach (Signed)
 Aging Gracefully Program  OT Follow-Up Visit  11/24/2023  THUY ATILANO [birth year] [id number]  Visit:  2- Second Visit  Start Time:  1500 End Time:  1630 Total Minutes:  90  Readiness to Change Score :     Home Environment Assessment:    Durable Medical Equipment: Adaptive Equipment: Long Handled Sponge, Reacher, Sock Aid, Other (magnifying glass with light) Adaptive Equipment Distribution Date: 11/22/23  Patient Education: Education Provided: Yes Education Details: reviewed how to safely get up from a fall Person(s) Educated: Patient Comprehension: Verbalized Understanding  Goals:   Goals Addressed             This Visit's Progress    Patient Stated   On track    Improve independence with lower extremity dressing and bathing.  Name _________________________________       Date__________________  OT ACTION PLAN: Dressing and Bathing  Target Problem Area:   Decreased ability to reach lower extremities for bathing and dressing   Why Problem May Occur:     Can't bend over  Can't cross legs  Back and leg pain  Can't see items when they are on floor or too far away      Target Goal(s):   Improve safety and independence with lower extremity dressing and bathing.    STRATEGIES   Saving Your Energy DO:  X  Sit down to do tasks- a lightweight chair can be kept in the bathroom  X  Use appropriate adaptive equipment (circle appropriate equipment): long-handled shoe horn, sock aide, dressing stick, reacher, long handled sponge.   Keep all items you'll need within easy reach  (Other):   (Other):   (Other):     Modifying your home environment and making it safe    DO:  X keep all clothing out and lay flat prior to attempting to don.                Simplifying the way you set up tasks or daily routines DO:  Wear sturdy shoes that grip the floor   Gather everything you will need before beginning  Wear clothing that is easily manipulated  (elastic waistband, etc.)  (Other)   (Other):   (Other):     PRACTICE  Based on what we have talked about, you are willing to try:  __use Adaptive Equipment for dressing and bathing. ___________________  ________________________________________________________________   If an idea does not work the first time, try it again (and again).  We may make some changes over the next few sessions, based on how they work.     Orren Blades, MHA, OT/L [phone] ___________________________________________    ___________ Occupational Therapist          Date          Post Clinical Reasoning: Client Action (Goal) One Interventions: Patient will improve safety and independence with lower extremity dressing and bathing. - Patient has difficulty reaching her lower legs and feet for bathing and dressing tasks.  OT introduced a long handled sponge, reacher, and sockaided and educated patient on use of these items.  Patient able to use reacher to pick items up from floor independently.  She declined trialing sock aide while OT present.  She states she will practice.  she was able to simulate using the long handled sponge to wash her feet. Did Client Try?: Yes Reason Client Did Not Try?: Not Enough Time Targeted Problem Area Status: A Little Better Client Action (Goal) Two Interventions: Difficulty with safety  and ADLs in home due to poor vision.  - issued a hand held magnifying glass with light function.  Patient will need to get batteries for light function to work. Did Client Try?: Yes Targeted Problem Area Status: A Little Better Clinician View Of Client Situation:: OT met [employer] home  repair rep at home.  Ms. Temkin states she has been using the bathroom more frequently and having accidents.  Mentions that changing clothes is difficult.  OT introduced reacher, sock aide, long handled sponge, and magnifying glass to assist with this activity.  Ms. Pio continues to rely on her  friends for support and makes due with what she has.  She has trouble locating items, such as a misplaced pair of glasses due to low vision.  She was also not able to locate coffee in cabinet - however OT could not locate either. She looks forward to trialing the new AE issued for her ADL goal today. Client View Of His/Her Situation:: continues to do the best she can.  She is working with [facility] home health and hoping to get an aide, as well as a bsc due to frequent urination due to lasix .   She has been in contact with [facility] and RN Nolberto Batty has been supporting her with community resources the best she is able to. Next Visit Plan:: Follow up on AE use.  Work on goal # improving safety in home with improved lighting.  Orren Blades, MHA, OT/L [phone]

## 2023-12-12 ENCOUNTER — Encounter: Payer: Self-pay | Admitting: *Deleted

## 2023-12-13 ENCOUNTER — Other Ambulatory Visit: Payer: Self-pay | Admitting: *Deleted

## 2023-12-13 NOTE — Patient Outreach (Addendum)
 Aging Gracefully Program  12/13/2023  ANANYA MCCLEESE [birth year] [id number]   Telephone call made to Ms. Jolliff to follow up on previously provided food pantry information. Ms. Pressly states her friend attempted to pick the food up for her but was told Ms. Allington must come in person to provide her ID and etc. Ms. Tierney states her friend plans to taker her this Saturday to [employer].   In the meantime, Ms. Vien endorses her refrigerator is not working and that she needs someone to place her window air conditioner unit in the window. States her air system has been broken for over 1 year. States she is hopeful her grandson will assist. However, expresses uncertainty if grandson will help place window air conditioner unit. Also states she has someone coming tomorrow to possibly repair refrigerator..   Message sent to [facility] to inquire whether they know of any resources that can assist Ms. Morga with above mentioned concerns.   Nolberto Batty, MSN, RN, BSN [place]  [facility], [facility] RN Case Manager for Aging Gracefully Direct Dial: [phone]

## 2023-12-19 ENCOUNTER — Encounter: Admitting: Specialist

## 2023-12-20 ENCOUNTER — Other Ambulatory Visit: Payer: Self-pay | Admitting: *Deleted

## 2023-12-20 ENCOUNTER — Encounter: Payer: Self-pay | Admitting: *Deleted

## 2023-12-20 DIAGNOSIS — E782 Mixed hyperlipidemia: Secondary | ICD-10-CM

## 2023-12-20 NOTE — Patient Instructions (Signed)
 Visit Information  Thank you for taking time to visit with me today. Please don't hesitate to contact me if I can be of assistance to you before our next scheduled home appointment.  Following are the goals we discussed today:   Goals Addressed               This Visit's Progress     AG RN (pt-stated)          CLIENT/RN ACTION PLAN - FALL PREVENTION  Registered Nurse:  Pablo Hurst Date: 10/27/23  Client Name:  Kerry Liu Client ID:    Target Area:  FALL PREVENTION    Why Problem May Occur: loss of balance    Target Goal: No falls over the next 160 days   STRATEGIES Coping Strategies Ideas  Change Positions Slowly: lying to sitting, sitting to standing  Reviewed 10/27/23 Reviewed 12/20/23 Changing positions can make people lightheaded. When getting up in the morning sit for a few minutes, before standing.  Stand for a few minutes before walking and hold on to sturdy furniture or countertop.    Drink water Reviewed 10/27/23 Reviewed 12/20/23 Dehydration can make people dizzy. Ask your healthcare provider how much water you can drink. Decrease caffeine, caffeine makes you urinate a lot, which can make you dehydrated.   If you fall, tell someone Reviewed 10/27/23 Reviewed 12/20/23  Tell a friend or family member even if you didn't get hurt. Tell your healthcare provider if you fall.  They can help you figure out why.   Get your vision and hearing checked Reviewed 10/27/23 Reviewed 12/20/23 Poor vision and hearing loss can make people fall. Glaucoma and diabetes can cause poor vision.   Other    Prevention Ideas  Review your Medicines  Reviewed 10/27/23 Reviewed 12/20/23 Many medicines can make you dizzy or sleepy and increase your risk of falling. Your Aging Gracefully Nurse will look at your medications and see if you are taking any that might cause falling.   Activity and Exercise Reviewed 10/27/23 Reviewed 12/20/23 (AG exercises demonstrated) Aging Gracefully  exercises Walking (inside or outside) [employer]:  cooking, Education officer, environmental, laundry Exercise while watching TV Swimming or water aerobics.   Strengthen Bones Reviewed 10/27/23 Reviewed 12/20/23 Exercise makes your bones stronger. Vitamin D  and Calcium  make your bones stronger.  Ask your Healthcare provider if you should be taking them. Your body makes vitamin D  from the sun.  Sit in the sun for 10 minutes every day (without sunscreen).   Control your urine  Reviewed 10/27/23 Reviewed 12/20/23 Prevent constipation Cut back on caffeinated drinks. Don't wait to urinate. If diabetic, control your blood sugar. Ask your Aging Gracefully Nurse and Healthcare Provider  about urine control.   Control your blood sugar (if you are diabetic) Reviewed 12/20/23 High blood sugar can cause frequent urination, poor vision, and numbness in your feet, which can make you fall.   Low blood sugar can cause confusion and dizziness.   Other coping strategies 1. 2. 3. 4. 5.   PRACTICE It is important to practice the strategies so we can determine if they will be effective in helping to reach the goal.    Follow these specific recommendations:        If strategy does not work the first time, try it again.     We may make some changes over the next few sessions.        10/27/23  Assessment: Kerry Liu lives alone. 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 Uses multiple eye drops. Kerry Liu is unable to read. Reports she is unable see to prepare meals. Uses microwave for meals and eats sandwiches. Receives 23 dollars in food stamps and receives food voucher of 50 dollars a month from [facility].  States medications are provided by mail pharmacy [facility] in pill packs. Recently unable to afford Eliquis . States PCP office is working on patient assistance application for Eliquis . Reports her friend [name]  assists with medication management. Reports she uses [employer] for  transportation. States she only has 2 transportation passes left until the beginning of September. States she goes to [facility] for PCP because they provide transportation services to PCP appointments. States family nor church members assist. States her friend [name]  assists weekly or so. Denies any falls. Uses rollator. [name] goes to [facility]. Therefore, she is not eligible for VBCI CCM services.   Interventions: Discussed fall precautions. #1 Called to leave message for PCP Dr. Dewayne with [facility] to request PCP order home health PT/OT/RN/SW/aide and bedside commode. #2 Left message for SW at [facility] to Higher education careers adviser to discuss [name]'s needs. #3 Contacted DSS to inquire about Kerry Liu reapplying for Medicaid as she previously had Medicaid x 8 yrs and was informed 1.5 years ago that she no longer qualified. Found out [name] remains over the limit to qualify for any type of Medicaid assistance per DSS. #4 Collaborated with [facility] about Ms. Bennie's medications. #5 Left message for nurse at [facility] to inquire about Eliquis  pharmacy assistance application. [name] said PCP office was working on applying for assistance program.#6 Contacted UHC to inquire about additional benefits. [name] plan does not provide transportation, care management, or medical alert system, or meals unless she has had a hospital or SNF admission. Provided writer's contact information. Left chronic disease booklet for friend [name]  to review. Encouraged [name] to ask her friend [name]  to check her blood sugars.   Plan: Scheduled next home visit for June 17th at 10 am.   Pablo Hurst, MSN, RN, BSN [place]  [facility], Healthy Communities RN Case Manager for Aging Gracefully Direct Dial: [phone]    12/20/23  Assessment: Kerry Liu denies any falls. Discussed fall precautions. Kerry Liu reports [facility] is providing a  large clock, magnifying glass, and 90 day free medical alert system. States the cost will be 60 dollars a month after 90 days. Reports church members have been taking her to MD appointments. States her friend [name]  continues to assist when able. States her granddaughter is coming around more and is assisting when able. Reports her brother installed her window [facility] unit. She reports staying cool with the window [facility] and additional fans in the home during the hot weather. Reports refrigerator was recently down but is now fixed. However, states the little food she had in the refrigerator went bad. States she still has gone to food pantry yet. States her friend [name]  is going to take her on this Saturday. States she called [employer] again and she is number [phone] on the waiting list. States she was told it will be about 2 years before she will receive Meals on Wheels. States Geographical information systems officer did offer to enroll her in a program for transportation to MD appointments. Therefore, she may have transportation assistance thru [employer]. The drivers are volunteers. Ms. Seiple expresses concerns about her upcoming expenses and is not sure what  she can afford to cut back. She was also able to receive assistance thru her pharmacy for Eliquis . States she pays 40 dollars a month for her Eliquis . Ms. Rezek endorses home health thru [facility] is still active for PT/OT/RN services. States home health RN, granddaughter, and her friend [name]  check her blood sugars. States blood sugars have been in the 140s.  Interventions: Performed Aging Gracefully home exercises. Ms. Riner participated with return demonstration on some of them. Provided Aging Gracefully home exercise booklet. Provided several food items since Ms. Vizcarrondo did not have any food in the home due to the refrigerator not working recently. Encouraged Ms. Corporan to ensure someone takes her to the food pantry. She has to go in person to fill  out application and etc. Provided [employer] address, telephone number, hours, and contact person information again. Encouraged Ms. Sparlin to inquire about lower cost for medical alert system. Encouraged Ms. Mier to contact [facility] for questions regarding home modifications and the like. Reiterated fall precautions and reminded Ms. Istre to have phone with her at all times when ambulating with her rollator. Complimented Ms. Lesnick for doing such a great job of Insurance risk surveyor and following up and calling agencies herself.   Plan: Scheduled next home visit for July 22nd at 10 am.   Pablo Hurst, MSN, RN, BSN [place]  [facility], Healthy Communities RN Case Manager for Aging Gracefully Direct Dial: [phone]                                                                               Our next appointment is on July 22nd  at 10 am  If you are experiencing a Mental Health or Behavioral Health Crisis or need someone to talk to, please call the Suicide and Crisis Lifeline: 988 call the [place]  National Suicide Prevention Lifeline: (979)408-9011 or TTY: 610-597-1326 TTY 985-223-5117) to talk to a trained counselor call 1-800-273-TALK (toll free, 24 hour hotline) go to [facility] [address], [place] [phone]) call 911   The patient verbalized understanding of instructions, educational materials, and care plan provided today and agreed to receive a mailed copy of patient instructions, educational materials, and care plan.   Pablo Hurst, MSN, RN, BSN [place]  [facility], Healthy Communities RN Case Manager for Aging Gracefully Direct Dial: [phone]

## 2023-12-20 NOTE — Patient Outreach (Signed)
 Aging Gracefully Program  RN Visit  12/20/2023  Kerry Liu Sep 05, 1950 [id number]  Visit:  RN Visit Number: 2- Second Visit  RN TIME CALCULATION: Start TIme:  RN Start Time Calculation: 1400 End Time:  RN Stop Time Calculation: 1545 Total Minutes:  RN Time Calculation: 105  Readiness To Change Score:  Readiness to Change Score: 10  Universal RN Interventions: Calendar Distribution: No Exercise Review: Yes Medications: Yes Medication Changes: Yes Mood: Yes Pain: Yes PCP Advocacy/Support: No Fall Prevention: Yes Incontinence: Yes Clinician View Of Client Situation: Arrived for home visit. Door was unlocked for Clinical research associate to enter. Kerry Liu was sitting on couch with rollator in front of her. Home with distinctive smell. Floor without clutter or rugs. Client View Of His/Her Situation: Kerry Liu reports she is trying to figure out how to cut costs with upcoming expenses.. States she is supposed to go to food pantry on this Saturday. Denies any falls.  Healthcare Provider Communication: Did Surveyor, mining With [employer] Provider?: No Healthcare Provider Response According to RN: N/A According to Client, Did PCP Report Communication With An Aging Gracefully RN?: No Healthcare Provider Response According To Client: N/A  Clinician View of Client Situation: Clinician View Of Client Situation: Arrived for home visit. Door was unlocked for Clinical research associate to enter. Kerry Liu was sitting on couch with rollator in front of her. Home with distinctive smell. Floor without clutter or rugs. Client's View of His/Her Situation: Client View Of His/Her Situation: Kerry Liu reports she is trying to figure out how to cut costs with upcoming expenses.. States she is supposed to go to food pantry on this Saturday. Denies any falls.  Medication Assessment: Reviewed.    OT Update: Pending [facility] contracts and assessments.   Session Summary: Kerry Liu is managing overall.   She maintains a  pleasant and engaging attitude and demeanor. She continues to have food insecurity and financial concerns. She plans to follow up on what was discussed during meeting today.    Goals Addressed               This Visit's Progress     AG RN (pt-stated)          CLIENT/RN ACTION PLAN - FALL PREVENTION  Registered Nurse:  Pablo Hurst Date: 10/27/23  Client Name:  [name] Client ID:    Target Area:  FALL PREVENTION    Why Problem May Occur: loss of balance    Target Goal: No falls over the next 160 days   STRATEGIES Coping Strategies Ideas  Change Positions Slowly: lying to sitting, sitting to standing  Reviewed 10/27/23 Reviewed 12/20/23 Changing positions can make people lightheaded. When getting up in the morning sit for a few minutes, before standing.  Stand for a few minutes before walking and hold on to sturdy furniture or countertop.    Drink water Reviewed 10/27/23 Reviewed 12/20/23 Dehydration can make people dizzy. Ask your healthcare provider how much water you can drink. Decrease caffeine, caffeine makes you urinate a lot, which can make you dehydrated.   If you fall, tell someone Reviewed 10/27/23 Reviewed 12/20/23  Tell a friend or family member even if you didn't get hurt. Tell your healthcare provider if you fall.  They can help you figure out why.   Get your vision and hearing checked Reviewed 10/27/23 Reviewed 12/20/23 Poor vision and hearing loss can make people fall. Glaucoma and diabetes can cause poor vision.   Other    Prevention Ideas  Review  your Medicines  Reviewed 10/27/23 Reviewed 12/20/23 Many medicines can make you dizzy or sleepy and increase your risk of falling. Your Aging Gracefully Nurse will look at your medications and see if you are taking any that might cause falling.   Activity and Exercise Reviewed 10/27/23 Reviewed 12/20/23 (AG exercises demonstrated) Aging Gracefully exercises Walking (inside or  outside) [employer]:  cooking, Education officer, environmental, laundry Exercise while watching TV Swimming or water aerobics.   Strengthen Bones Reviewed 10/27/23 Reviewed 12/20/23 Exercise makes your bones stronger. Vitamin D  and Calcium  make your bones stronger.  Ask your Healthcare provider if you should be taking them. Your body makes vitamin D  from the sun.  Sit in the sun for 10 minutes every day (without sunscreen).   Control your urine  Reviewed 10/27/23 Reviewed 12/20/23 Prevent constipation Cut back on caffeinated drinks. Don't wait to urinate. If diabetic, control your blood sugar. Ask your Aging Gracefully Nurse and Healthcare Provider  about urine control.   Control your blood sugar (if you are diabetic) Reviewed 12/20/23 High blood sugar can cause frequent urination, poor vision, and numbness in your feet, which can make you fall.   Low blood sugar can cause confusion and dizziness.   Other coping strategies 1. 2. 3. 4. 5.   PRACTICE It is important to practice the strategies so we can determine if they will be effective in helping to reach the goal.    Follow these specific recommendations:        If strategy does not work the first time, try it again.     We may make some changes over the next few sessions.        10/27/23  Assessment: Kerry Liu lives alone. 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 Uses multiple eye drops. Kerry Liu is unable to read. Reports she is unable see to prepare meals. Uses microwave for meals and eats sandwiches. Receives 23 dollars in food stamps and receives food voucher of 50 dollars a month from [facility].  States medications are provided by mail pharmacy [facility] in pill packs. Recently unable to afford Eliquis . States PCP office is working on patient assistance application for Eliquis . Reports her friend [name]  assists with medication management. Reports she uses [employer] for transportation. States she  only has 2 transportation passes left until the beginning of September. States she goes to [facility] for PCP because they provide transportation services to PCP appointments. States family nor church members assist. States her friend [name]  assists weekly or so. Denies any falls. Uses rollator. Ms. Rausch goes to [facility]. Therefore, she is not eligible for VBCI CCM services.   Interventions: Discussed fall precautions. #1 Called to leave message for PCP Dr. Dewayne with [facility] to request PCP order home health PT/OT/RN/SW/aide and bedside commode. #2 Left message for SW at [facility] to Higher education careers adviser to discuss Ms. Burrowes's needs. #3 Contacted DSS to inquire about Ms. Graveline reapplying for Medicaid as she previously had Medicaid x 8 yrs and was informed 1.5 years ago that she no longer qualified. Found out [name] remains over the limit to qualify for any type of Medicaid assistance per DSS. #4 Collaborated with [facility] about Ms. Olenick's medications. #5 Left message for nurse at [facility] to inquire about Eliquis  pharmacy assistance application. Ms. Mcculley said PCP office was working on applying for assistance program.#6 Contacted UHC to inquire about additional benefits. Ms. [facility] plan does not provide transportation, care management, or  medical alert system, or meals unless she has had a hospital or SNF admission. Provided writer's contact information. Left chronic disease booklet for friend [name]  to review. Encouraged Ms. Shippee to ask her friend [name]  to check her blood sugars.   Plan: Scheduled next home visit for June 17th at 10 am.   Pablo Hurst, MSN, RN, BSN [place]  [facility], Healthy Communities RN Case Manager for Aging Gracefully Direct Dial: [phone]    12/20/23  Assessment: Ms. Cellucci denies any falls. Discussed fall precautions. Ms. Primiano reports [facility] [employer] is providing a large clock, magnifying glass,  and 90 day free medical alert system. States the cost will be 60 dollars a month after 90 days. Reports church members have been taking her to MD appointments. States her friend [name]  continues to assist when able. States her granddaughter is coming around more and is assisting when able. Reports her brother installed her window [facility] unit. She reports staying cool with the window [facility] and additional fans in the home during the hot weather. Reports refrigerator was recently down but is now fixed. However, states the little food she had in the refrigerator went bad. States she still has gone to food pantry yet. States her friend [name]  is going to take her on this Saturday. States she called [employer] again and she is number 315 on the waiting list. States she was told it will be about 2 years before she will receive Meals on Wheels. States Geographical information systems officer did offer to enroll her in a program for transportation to MD appointments. Therefore, she may have transportation assistance thru [employer]. The drivers are volunteers. Ms. Guardiola expresses concerns about her upcoming expenses and is not sure what she can afford to cut back. She was also able to receive assistance thru her pharmacy for Eliquis . States she pays 40 dollars a month for her Eliquis . Ms. Roskelley endorses home health thru [facility] is still active for PT/OT/RN services. States home health RN, granddaughter, and her friend [name]  check her blood sugars. States blood sugars have been in the 140s.  Interventions: Performed Aging Gracefully home exercises. Ms. Weipert participated with return demonstration on some of them. Provided Aging Gracefully home exercise booklet. Provided several food items since Ms. Grandison did not have any food in the home due to the refrigerator not working recently. Encouraged Ms. Denman to ensure someone takes her to the food pantry. She has to go in person to fill out application and etc.  Provided [employer] address, telephone number, hours, and contact person information again. Encouraged Ms. Canny to inquire about lower cost for medical alert system. Encouraged Ms. Adriance to contact [facility] for questions regarding home modifications and the like. Reiterated fall precautions and reminded Ms. Cordell to have phone with her at all times when ambulating with her rollator. Complimented Ms. Wargo for doing such a great job of Insurance risk surveyor and following up and calling agencies herself.   Plan: Scheduled next home visit for July 22nd at 10 am.   Pablo Hurst, MSN, RN, BSN [place]  [facility], Healthy Communities RN Case Manager for Aging Gracefully Direct Dial: [phone]

## 2023-12-26 ENCOUNTER — Other Ambulatory Visit: Payer: Self-pay | Admitting: Specialist

## 2023-12-26 NOTE — Patient Outreach (Signed)
 Aging Gracefully Program  OT Follow-Up Visit  12/26/2023  Kerry Liu Mar 11, 1951 [id number]  Visit:  3- Third Visit  Start Time:  1550 End Time:  1700 Total Minutes:  70  Goals:   Goals Addressed             This Visit's Progress    Patient Stated   On track    Improve lighting in home for greater safety and independence with daily tasks.      Patient Stated   On track    Improve kitchen safety and independence with meal prep.  Difficulty with safety and ADLs in home due to poor vision. - issued a hand held magnifying glass with light function. Patient will need to get batteries for light function to work. Discussed tips and tricks for low vision when cooking and shopping, including using contrasting colors when measuring, chopping, filling cups and bowls. Patient also uses raised paint on microwave buttons and stacks food with containers of vegetables on one side of fridge, meats on other. Suggested use of prechopped food items purchased from the grocery store. Discussed rotating foods so that older are on top and used prior to newer foods. Provided patient with handout that includes these and many other strategies.         Post Clinical Reasoning: Client Action (Goal) Two Interventions: Difficulty with safety and ADLs in home due to poor vision. - issued a hand held magnifying glass with light function. Patient will need to get batteries for light function to work.  Discussed tips and tricks for low vision when cooking and shopping, including using contrasting colors when measuring, chopping, filling cups and bowls.  Patient also uses raised paint on microwave  buttons and stacks food with containers of vegetables on one side of fridge, meats on other.  Suggested use of prechopped food items purchased from the grocery store.  Discussed rotating foods so that older are on top and used prior to newer foods.  Provided patient with handout that includes these and many other  strategies. Did Client Try?: No Did Client Try?: No Clinician View Of Client Situation:: Patient presents with improved energy and mobility compared to previous visit.  Patient reports that she has been using her reacher, sockaide, and dressing tools to assist iwth dressing and other tasks, such as finding her glasses when lost.  She is also using the handheld magnifying glass that was provided to her.  When problem solving goals this date, paitent able to suggest several techniques that she has recently implemented to compensate for her low vision while cooking and completing meal prep. Client View Of His/Her Situation:: Patient states that AE that was issued at last visit have been helping signficantly.  She is able to implement strategies to compensate for low vision when completing meal pep and is open to suggestions from OT.  Patient is concerned about upcoming of costs for home repairs, medications and life line.  Requesting OT check on prices for alternatives to lifeline that are more economical. Next Visit Plan:: Follow up on meal prep strategies, work on improved bathroom safety goal.  provide patient with magnified lamp once OT receives.  Kerry Liu HILARIO Elbe, MHA, OT/L [phone]

## 2024-01-16 ENCOUNTER — Other Ambulatory Visit: Payer: Self-pay | Admitting: *Deleted

## 2024-01-16 ENCOUNTER — Encounter: Payer: Self-pay | Admitting: *Deleted

## 2024-01-16 NOTE — Patient Instructions (Signed)
 Visit Information  Thank you for taking time to visit with me today. Please don't hesitate to contact me if I can be of assistance to you before our next scheduled home appointment.  Following are the goals we discussed today:   Goals Addressed               This Visit's Progress     AG RN (pt-stated)          CLIENT/RN ACTION PLAN - FALL PREVENTION  Registered Nurse:  Pablo Hurst Date: 10/27/23  Client Name:  Kerry Liu Client ID:    Target Area:  FALL PREVENTION    Why Problem May Occur: loss of balance    Target Goal: No falls over the next 160 days   STRATEGIES Coping Strategies Ideas  Change Positions Slowly: lying to sitting, sitting to standing  Reviewed 10/27/23 Reviewed 12/20/23 Reviewed 01/16/24 Changing positions can make people lightheaded. When getting up in the morning sit for a few minutes, before standing.  Stand for a few minutes before walking and hold on to sturdy furniture or countertop.    Drink water Reviewed 10/27/23 Reviewed 12/20/23 Reviewed 01/16/24 Dehydration can make people dizzy. Ask your healthcare provider how much water you can drink. Decrease caffeine, caffeine makes you urinate a lot, which can make you dehydrated.   If you fall, tell someone Reviewed 10/27/23 Reviewed 12/20/23 Reviewed 01/16/24  Tell a friend or family member even if you didn't get hurt. Tell your healthcare provider if you fall.  They can help you figure out why.   Get your vision and hearing checked Reviewed 10/27/23 Reviewed 12/20/23 Reviewed 01/16/24 Poor vision and hearing loss can make people fall. Glaucoma and diabetes can cause poor vision.   Other    Prevention Ideas  Review your Medicines  Reviewed 10/27/23 Reviewed 12/20/23 Reviewed 01/16/24 Many medicines can make you dizzy or sleepy and increase your risk of falling. Your Aging Gracefully Nurse will look at your medications and see if you are taking any that might cause falling.   Activity and  Exercise Reviewed 10/27/23 Reviewed 12/20/23 (AG exercises demonstrated) Reviewed 01/16/24 Aging Gracefully exercises Walking (inside or outside) [employer]:  cooking, Education officer, environmental, laundry Exercise while watching TV Swimming or water aerobics.   Strengthen Bones Reviewed 10/27/23 Reviewed 12/20/23 Reviewed 01/16/24 Exercise makes your bones stronger. Vitamin D  and Calcium  make your bones stronger.  Ask your Healthcare provider if you should be taking them. Your body makes vitamin D  from the sun.  Sit in the sun for 10 minutes every day (without sunscreen).   Control your urine  Reviewed 10/27/23 Reviewed 12/20/23 Reviewed 01/16/24 Prevent constipation Cut back on caffeinated drinks. Don't wait to urinate. If diabetic, control your blood sugar. Ask your Aging Gracefully Nurse and Healthcare Provider  about urine control.   Control your blood sugar (if you are diabetic) Reviewed 12/20/23 Reviewed 01/16/24 High blood sugar can cause frequent urination, poor vision, and numbness in your feet, which can make you fall.   Low blood sugar can cause confusion and dizziness.   Other coping strategies 1. 2. 3. 4. 5.   PRACTICE It is important to practice the strategies so we can determine if they will be effective in helping to reach the goal.    Follow these specific recommendations:        If strategy does not work the first time, try it again.     We may make some changes over the next few sessions.  10/27/23  Assessment: Kerry Liu lives alone. 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 Uses multiple eye drops. Kerry Liu is unable to read. Reports she is unable see to prepare meals. Uses microwave for meals and eats sandwiches. Receives 23 dollars in food stamps and receives food voucher of 50 dollars a month from [name].  States medications are provided by mail pharmacy [facility] in pill packs. Recently unable to afford Eliquis . States PCP office  is working on patient assistance application for Eliquis . Reports her friend [name]  assists with medication management. Reports she uses [employer] for transportation. States she only has 2 transportation passes left until the beginning of September. States she goes to [facility] for PCP because they provide transportation services to PCP appointments. States family nor church members assist. States her friend [name]  assists weekly or so. Denies any falls. Uses rollator. Ms. Breiner goes to [facility]. Therefore, she is not eligible for [facility] CCM services.   Interventions: Discussed fall precautions. #1 Called to leave message for PCP Dr. Dewayne with [facility] to request PCP order home health PT/OT/RN/SW/aide and bedside commode. #2 Left message for SW at [facility] to Higher education careers adviser to discuss Kerry Liu's needs. #3 Contacted DSS to inquire about Kerry Liu reapplying for Medicaid as she previously had Medicaid x 8 yrs and was informed 1.5 years ago that she no longer qualified. Found out Kerry Liu remains over the limit to qualify for any type of Medicaid assistance per DSS. #4 Collaborated with [facility] about Kerry Liu's medications. #5 Left message for nurse at [facility] to inquire about Eliquis  pharmacy assistance application. Kerry Liu said PCP office was working on applying for assistance program.#6 Contacted [employer] to inquire about additional benefits. Ms. Kuch [facility] plan does not provide transportation, care management, or medical alert system, or meals unless she has had a hospital or SNF admission. Provided writer's contact information. Left chronic disease booklet for friend [name]  to review. Encouraged Ms. Vandrunen to ask her friend [name]  to check her blood sugars.   Plan: Scheduled next home visit for June 17th at 10 am.   Pablo Hurst, MSN, RN, BSN [place]  [facility], Healthy Communities RN Case Manager for Aging Gracefully Direct Dial:  [phone]    12/20/23  Assessment: Kerry Liu denies any falls. Discussed fall precautions. Kerry Liu reports [facility] is providing a large clock, magnifying glass, and 90 day free medical alert system. States the cost will be 60 dollars a month after 90 days. Reports church members have been taking her to MD appointments. States her friend [name]  continues to assist when able. States her granddaughter is coming around more and is assisting when able. Reports her brother installed her window [facility] unit. She reports staying cool with the window [facility] and additional fans in the home during the hot weather. Reports refrigerator was recently down but is now fixed. However, states the little food she had in the refrigerator went bad. States she still has gone to food pantry yet. States her friend [name]  is going to take her on this Saturday. States she called [employer] again and she is number [phone] on the waiting list. States she was told it will be about 2 years before she will receive Meals on Wheels. States Geographical information systems officer did offer to enroll her in a program for transportation to MD appointments. Therefore, she may have transportation assistance thru [employer]. The drivers are volunteers. Kerry Liu  expresses concerns about her upcoming expenses and is not sure what she can afford to cut back. She was also able to receive assistance thru her pharmacy for Eliquis . States she pays 40 dollars a month for her Eliquis . Kerry Liu endorses home health thru [facility] is still active for PT/OT/RN services. States home health RN, granddaughter, and her friend [name]  check her blood sugars. States blood sugars have been in the 140s.  Interventions: Performed Aging Gracefully home exercises. Kerry Liu participated with return demonstration on some of them. Provided Aging Gracefully home exercise booklet. Provided several food items since Kerry Liu did not have any food in  the home due to the refrigerator not working recently. Encouraged Kerry Liu to ensure someone takes her to the food pantry. She has to go in person to fill out application and etc. Provided [employer] address, telephone number, hours, and contact person information again. Encouraged Kerry Liu to inquire about lower cost for medical alert system. Encouraged Kerry Liu to contact [facility] for questions regarding home modifications and the like. Reiterated fall precautions and reminded Ms. Orf to have phone with her at all times when ambulating with her rollator. Complimented Ms. Ortlieb for doing such a great job of Insurance risk surveyor and following up and calling agencies herself.   Plan: Scheduled next home visit for July 22nd at 10 am.   Pablo Hurst, MSN, RN, BSN [place]  [facility], Healthy Communities RN Case Manager for Aging Gracefully Direct Dial: [phone]     01/16/24   Assessment: Ms. Dubach denies any recent falls. States she recently had an eye doctor appointment. States my eyes are not any better and are not any worse. States she has found a cheaper medical alert system company but states she will engage with them after her free trial is up with the current medical alert system company. States she has tried to solicit more help from family. However, states no one is helping. States her granddaughter helps from time to time. Endorses having an upcoming meeting with a volunteer who may know someone who can clean her home for 20 dollars. States others have wanted to charge her 80 dollars a week. Which she cannot afford. States she called [facility] and was told she will need her own transportation for the day program. Reports going to the food pantry but states she will need to go on Wednesdays instead of Saturday. States [name]  is pretty busy as of late. Confirms still using transportation thru [employer]. Confirms last PCP visit last week. Confirms  [facility] continues working with her. Has a congested cough. States she has been sleeping in the living room under the air conditioner. States she has been doing home exercises 1-2 times a day.   Intervention: Discussed fall precautions. Provided bag of donated food. Discussed DM management. Encouraged Ms. Backstrom to ask her granddaughter to take her to food pantry on Wednesdays. Encouraged Ms. Crooke to contact PCP office about her congested cough.   Plan: Scheduled next home visit August 28th at 10 am.   Pablo Hurst, MSN, RN, BSN [place]  [facility], Healthy Communities RN Case Manager for Aging Gracefully Direct Dial: [phone]  Our next appointment is on August 28 at 10am.  If you are experiencing a Mental Health or Behavioral Health Crisis or need someone to talk to, please call the Suicide and Crisis Lifeline: 988 call the [place]  National Suicide Prevention Lifeline: 825 568 5088 or TTY: 917 287 4997 TTY 815-389-2947) to talk to a trained counselor call 1-800-273-TALK (toll free, 24 hour hotline) go to [facility] [address], [place] [phone]) call 911   The patient verbalized understanding of instructions, educational materials, and care plan provided today and agreed to receive a mailed copy of patient instructions, educational materials, and care plan.   Pablo Hurst, MSN, RN, BSN [place]  [facility], Healthy Communities RN Case Manager for Aging Gracefully Direct Dial: [phone]

## 2024-01-16 NOTE — Patient Outreach (Signed)
 Aging Gracefully Program  RN Visit  01/16/2024  Kerry Liu 1951-04-11 [id number]  Visit:  RN Visit Number: 3- Third Visit  RN TIME CALCULATION: Start TIme:  RN Start Time Calculation: 1000 End Time:  RN Stop Time Calculation: 1100 Total Minutes:  RN Time Calculation: 60  Readiness To Change Score:  Readiness to Change Score: 10  Universal RN Interventions: Calendar Distribution: No Exercise Review: Yes Medications: Yes Medication Changes: No Mood: Yes Pain: Yes PCP Advocacy/Support: No Fall Prevention: Yes Incontinence: Yes Clinician View Of Client Situation: Arrived for home visit. Door was unlocked for Clinical research associate to enter. Kerry Liu sitting on couch with rollator in front of her. Home neat. Living room area floor clear of tripping hazards. Client View Of His/Her Situation: Kerry Liu reports she is still in process of looking for assistance for someone to clean her home. Denies pain or falls.  Healthcare Provider Communication: Did Surveyor, mining With [employer] Provider?: No Healthcare Provider Response According to RN: n/a According to Client, Did PCP Report Communication With An Aging Gracefully RN?: No Healthcare Provider Response According To Client: n/a  Clinician View of Client Situation: Clinician View Of Client Situation: Arrived for home visit. Door was unlocked for Clinical research associate to enter. Kerry Liu sitting on couch with rollator in front of her. Home neat. Living room area floor clear of tripping hazards. Client's View of His/Her Situation: Client View Of His/Her Situation: Kerry Liu reports she is still in process of looking for assistance for someone to clean her home. Denies pain or falls.  Medication Assessment: reviewed    OT Update: Pending CHS assessments and completion.  Session Summary: Kerry Liu remains upbeat and encouraged. She is hopeful she will find someone to assist her with housekeeping tasks for a cost she can afford.   Goals  Addressed               This Visit's Progress     AG RN (pt-stated)          CLIENT/RN ACTION PLAN - FALL PREVENTION  Registered Nurse:  Kerry Liu Date: 10/27/23  Client Name:  Kerry Liu Client ID:    Target Area:  FALL PREVENTION    Why Problem May Occur: loss of balance    Target Goal: No falls over the next 160 days   STRATEGIES Coping Strategies Ideas  Change Positions Slowly: lying to sitting, sitting to standing  Reviewed 10/27/23 Reviewed 12/20/23 Reviewed 01/16/24 Changing positions can make people lightheaded. When getting up in the morning sit for a few minutes, before standing.  Stand for a few minutes before walking and hold on to sturdy furniture or countertop.    Drink water Reviewed 10/27/23 Reviewed 12/20/23 Reviewed 01/16/24 Dehydration can make people dizzy. Ask your healthcare provider how much water you can drink. Decrease caffeine, caffeine makes you urinate a lot, which can make you dehydrated.   If you fall, tell someone Reviewed 10/27/23 Reviewed 12/20/23 Reviewed 01/16/24  Tell a friend or family member even if you didn't get hurt. Tell your healthcare provider if you fall.  They can help you figure out why.   Get your vision and hearing checked Reviewed 10/27/23 Reviewed 12/20/23 Reviewed 01/16/24 Poor vision and hearing loss can make people fall. Glaucoma and diabetes can cause poor vision.   Other    Prevention Ideas  Review your Medicines  Reviewed 10/27/23 Reviewed 12/20/23 Reviewed 01/16/24 Many medicines can make you dizzy or sleepy and increase your risk of falling. Your  Aging Gracefully Nurse will look at your medications and see if you are taking any that might cause falling.   Activity and Exercise Reviewed 10/27/23 Reviewed 12/20/23 (AG exercises demonstrated) Reviewed 01/16/24 Aging Gracefully exercises Walking (inside or outside) [employer]:  cooking, Education officer, environmental, laundry Exercise while watching TV Swimming or water  aerobics.   Strengthen Bones Reviewed 10/27/23 Reviewed 12/20/23 Reviewed 01/16/24 Exercise makes your bones stronger. Vitamin D  and Calcium  make your bones stronger.  Ask your Healthcare provider if you should be taking them. Your body makes vitamin D  from the sun.  Sit in the sun for 10 minutes every day (without sunscreen).   Control your urine  Reviewed 10/27/23 Reviewed 12/20/23 Reviewed 01/16/24 Prevent constipation Cut back on caffeinated drinks. Don't wait to urinate. If diabetic, control your blood sugar. Ask your Aging Gracefully Nurse and Healthcare Provider  about urine control.   Control your blood sugar (if you are diabetic) Reviewed 12/20/23 Reviewed 01/16/24 High blood sugar can cause frequent urination, poor vision, and numbness in your feet, which can make you fall.   Low blood sugar can cause confusion and dizziness.   Other coping strategies 1. 2. 3. 4. 5.   PRACTICE It is important to practice the strategies so we can determine if they will be effective in helping to reach the goal.    Follow these specific recommendations:        If strategy does not work the first time, try it again.     We may make some changes over the next few sessions.        10/27/23  Assessment: Kerry Liu lives alone. 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 Uses multiple eye drops. Kerry Liu is unable to read. Reports she is unable see to prepare meals. Uses microwave for meals and eats sandwiches. Receives 23 dollars in food stamps and receives food voucher of 50 dollars a month from [facility].  States medications are provided by mail pharmacy [facility] in pill packs. Recently unable to afford Eliquis . States PCP office is working on patient assistance application for Eliquis . Reports her friend [name]  assists with medication management. Reports she uses [employer] for transportation. States she only has 2 transportation passes left until the beginning of  September. States she goes to [facility] for PCP because they provide transportation services to PCP appointments. States family nor church members assist. States her friend [name]  assists weekly or so. Denies any falls. Uses rollator. Kerry Liu goes to [facility]. Therefore, she is not eligible for [facility] CCM services.   Interventions: Discussed fall precautions. #1 Called to leave message for PCP Dr. Dewayne with [facility] to request PCP order home health PT/OT/RN/SW/aide and bedside commode. #2 Left message for SW at [facility] to Higher education careers adviser to discuss Kerry Liu's needs. #3 Contacted DSS to inquire about Kerry Liu reapplying for Medicaid as she previously had Medicaid x 8 yrs and was informed 1.5 years ago that she no longer qualified. Found out Kerry Liu. Bartely remains over the limit to qualify for any type of Medicaid assistance per DSS. #4 Collaborated with [facility] about Kerry Liu's medications. #5 Left message for nurse at [facility] to inquire about Eliquis  pharmacy assistance application. Kerry Liu said PCP office was working on applying for assistance program.#6 Contacted [employer] to inquire about additional benefits. Kerry Liu [facility] plan does not provide transportation, care management, or medical alert system, or meals unless she has had a hospital or SNF admission.  Provided writer's contact information. Left chronic disease booklet for friend [name]  to review. Encouraged Kerry Liu. Warman to ask her friend [name]  to check her blood sugars.   Plan: Scheduled next home visit for June 17th at 10 am.   Kerry Hurst, MSN, RN, BSN [place]  [facility], Healthy Communities RN Case Manager for Aging Gracefully Direct Dial: [phone]    12/20/23  Assessment: Kerry Liu denies any falls. Discussed fall precautions. Kerry Liu reports [facility] is providing a large clock, magnifying glass, and 90 day free medical alert system. States the cost will  be 60 dollars a month after 90 days. Reports church members have been taking her to MD appointments. States her friend [name]  continues to assist when able. States her granddaughter is coming around more and is assisting when able. Reports her brother installed her window [facility] unit. She reports staying cool with the window [facility] and additional fans in the home during the hot weather. Reports refrigerator was recently down but is now fixed. However, states the little food she had in the refrigerator went bad. States she still has gone to food pantry yet. States her friend [name]  is going to take her on this Saturday. States she called [employer] again and she is number [phone] on the waiting list. States she was told it will be about 2 years before she will receive Meals on Wheels. States Geographical information systems officer did offer to enroll her in a program for transportation to MD appointments. Therefore, she may have transportation assistance thru [employer]. The drivers are volunteers. Kerry Liu. Uriarte expresses concerns about her upcoming expenses and is not sure what she can afford to cut back. She was also able to receive assistance thru her pharmacy for Eliquis . States she pays 40 dollars a month for her Eliquis . Kerry Liu. Fatica endorses home health thru [facility] is still active for PT/OT/RN services. States home health RN, granddaughter, and her friend [name]  check her blood sugars. States blood sugars have been in the 140s.  Interventions: Performed Aging Gracefully home exercises. Kerry Liu. Raczkowski participated with return demonstration on some of them. Provided Aging Gracefully home exercise booklet. Provided several food items since Kerry Liu. Brodt did not have any food in the home due to the refrigerator not working recently. Encouraged Kerry Liu. Kopec to ensure someone takes her to the food pantry. She has to go in person to fill out application and etc. Provided [employer] address, telephone number, hours, and  contact person information again. Encouraged Kerry Liu. Sassaman to inquire about lower cost for medical alert system. Encouraged Kerry Liu. Riddle to contact [facility] for questions regarding home modifications and the like. Reiterated fall precautions and reminded Kerry Liu. Kaps to have phone with her at all times when ambulating with her rollator. Complimented Kerry Liu. Wilds for doing such a great job of Insurance risk surveyor and following up and calling agencies herself.   Plan: Scheduled next home visit for July 22nd at 10 am.   Kerry Hurst, MSN, RN, BSN [place]  [facility], Healthy Communities RN Case Manager for Aging Gracefully Direct Dial: [phone]     01/16/24   Assessment: Kerry Liu. Fera denies any recent falls. States she recently had an eye doctor appointment. States my eyes are not any better and are not any worse. States she has found a cheaper medical alert system company but states she will engage with them after her free trial is up with the current medical alert system company. States she has tried  to solicit more help from family. However, states no one is helping. States her granddaughter helps from time to time. Endorses having an upcoming meeting with a volunteer who may know someone who can clean her home for 20 dollars. States others have wanted to charge her 80 dollars a week. Which she cannot afford. States she called [facility] and was told she will need her own transportation for the day program. Reports going to the food pantry but states she will need to go on Wednesdays instead of Saturday. States [name]  is pretty busy as of late. Confirms still using transportation thru [employer]. Confirms last PCP visit last week. Confirms [facility] continues working with her. Has a congested cough. States she has been sleeping in the living room under the air conditioner. States she has been doing home exercises 1-2 times a day.   Intervention: Discussed fall  precautions. Provided bag of donated food. Discussed DM management. Encouraged Kerry Liu. Cloninger to ask her granddaughter to take her to food pantry on Wednesdays. Encouraged Kerry Liu. Ohlinger to contact PCP office about her congested cough.   Plan: Scheduled next home visit August 28th at 10 am.   Kerry Hurst, MSN, RN, BSN [place]  [facility], Healthy Communities RN Case Manager for Aging Gracefully Direct Dial: [phone]                                                                                                Kerry Hurst, MSN, RN, BSN   [facility], Healthy Communities RN Case Manager for Aging Gracefully Direct Dial: [phone]

## 2024-02-06 ENCOUNTER — Ambulatory Visit (INDEPENDENT_AMBULATORY_CARE_PROVIDER_SITE_OTHER): Admitting: Podiatry

## 2024-02-06 ENCOUNTER — Encounter: Payer: Self-pay | Admitting: Podiatry

## 2024-02-06 DIAGNOSIS — E119 Type 2 diabetes mellitus without complications: Secondary | ICD-10-CM

## 2024-02-06 DIAGNOSIS — M79674 Pain in right toe(s): Secondary | ICD-10-CM

## 2024-02-06 DIAGNOSIS — M79675 Pain in left toe(s): Secondary | ICD-10-CM | POA: Diagnosis not present

## 2024-02-06 DIAGNOSIS — B351 Tinea unguium: Secondary | ICD-10-CM | POA: Diagnosis not present

## 2024-02-11 NOTE — Progress Notes (Signed)
  Subjective:  Patient ID: Kerry Liu, female    DOB: 1950/07/21,  MRN: [id number]  Kerry Liu presents to clinic today for preventative diabetic foot care for painful thick toenails that are difficult to trim. Pain interferes with ambulation. Aggravating factors include wearing enclosed shoe gear. Pain is relieved with periodic professional debridement.  Chief Complaint  Patient presents with   [facility]    Rm15 Diabetic foot Care Dr. Haze Last visit August 2025/ A1C 5   New problem(s): None.   PCP is Paliwal, Himanshu, MD.  Allergies  Allergen Reactions   Codeine  Itching   Hydrocodone Itching   Oxycodone Rash   Tramadol Rash    Review of Systems: Negative except as noted in the HPI.  Objective: No changes noted in today's physical examination. There were no vitals filed for this visit. Kerry Liu is a pleasant 73 y.o. female morbidly obese in NAD. AAO x 3.  Vascular Examination: Capillary refill time immediate b/l. Palpable pedal pulses. Pedal hair present b/l. Pedal edema absent. No pain with calf compression b/l. Skin temperature gradient WNL b/l. No cyanosis or clubbing b/l. No ischemia or gangrene noted b/l.   Neurological Examination: Sensation grossly intact b/l with 10 gram monofilament. Vibratory sensation intact b/l.   Dermatological Examination: Pedal skin with normal turgor, texture and tone b/l.  No open wounds. No interdigital macerations.   Toenails 1-5 b/l thick, discolored, elongated with subungual debris and pain on dorsal palpation.   No corns, calluses, nor porokeratotic lesions.  Musculoskeletal Examination: Muscle strength 5/5 to all lower extremity muscle groups bilaterally. No pain, crepitus or joint limitation noted with ROM bilateral LE.  Radiographs: None  Assessment/Plan: 1. Pain due to onychomycosis of toenails of both feet   2. Controlled type 2 diabetes mellitus without complication, without long-term current use of insulin  [facility])     Patient was evaluated and treated. All patient's and/or POA's questions/concerns addressed on today's visit. Mycotic toenails 1-5 debrided in length and girth. Dremel abrasion proximal nailfold addressed with Lumicain Hemostatic solution. TAO applied. Continue daily foot inspections and monitor blood glucose per PCP/Endocrinologist's recommendations.Continue soft, supportive shoe gear daily. Report any pedal injuries to medical professional. Call office if there are any quesitons/concerns. -Patient/POA to call should there be question/concern in the interim.   Return in about 3 months (around 05/08/2024).  [name], DPM      [facility] LOCATION: [address] [place] [address]                   Office [phone]   [facility] LOCATION: [address] [place], [place] [address] Office [phone]

## 2024-02-21 ENCOUNTER — Other Ambulatory Visit: Payer: Self-pay | Admitting: *Deleted

## 2024-02-21 NOTE — Patient Outreach (Deleted)
 Aging Gracefully Program  02/21/2024  Kerry Liu 1951-06-14 [id number]  Telephone call made to change appointment time tomorrow to 1030 am instead of 10 am. Encouraged [name] to contact [employer] (food pantry) to inquire about food delivery. [name] states she will ask her friend [name]  to call. States [name]  has been going to the food pantry on her behalf weekly.   Discussed medical alert system. States she is still trying to find a lower cost medical alert system. Discussed Clinical research associate will inquire with [facility] med alert about possible options.  Plan: will plan to discuss further during AG RN home visit tomorrow.   Pablo Hurst, MSN, RN, BSN [place]  [facility], Healthy Communities RN Case Manager for Aging Gracefully Direct Dial: [phone]

## 2024-02-21 NOTE — Patient Outreach (Signed)
  Aging Gracefully Program   02/21/2024   TENA LINEBAUGH [birth year] [id number]   Telephone call made to change appointment time tomorrow to 1030 am instead of 10 am. Encouraged Ms. Manocchio to contact [employer] (food pantry) to inquire about food delivery. Ms. Mccoy states she will ask her friend [name]  to call. States [name]  has been going to the food pantry on her behalf weekly.    Discussed medical alert system. States she is still trying to find a lower cost medical alert system. Discussed Clinical research associate will inquire with [facility] med alert about possible options.   Plan: will plan to discuss further during AG RN home visit tomorrow.     Pablo Hurst, MSN, RN, BSN [place]  [facility], Healthy Communities RN Case Manager for Aging Gracefully Direct Dial: [phone]

## 2024-02-22 ENCOUNTER — Encounter: Payer: Self-pay | Admitting: *Deleted

## 2024-02-22 ENCOUNTER — Other Ambulatory Visit: Admitting: *Deleted

## 2024-02-22 NOTE — Patient Outreach (Signed)
 Aging Gracefully Program  RN Visit  02/22/2024  Kerry Liu July 20, 1950 [id number]  Visit:  RN Visit Number: 4- Fourth Visit  RN TIME CALCULATION: Start TIme:  RN Start Time Calculation: 1030 End Time:  RN Stop Time Calculation: 1145 Total Minutes:  RN Time Calculation: 75  Readiness To Change Score:  Readiness to Change Score: 10  Universal RN Interventions: Calendar Distribution: No Exercise Review: Yes Medications: Yes Medication Changes: No Mood: Yes Pain: Yes PCP Advocacy/Support: No Fall Prevention: Yes Incontinence: Yes Clinician View Of Client Situation: Arrived for home visit. Living room area without clutter. Living room bright from lamp lighting.Kerry Liu sitting on couch. Uses rollator when walking around home. Client View Of His/Her Situation: Ms. Bobak denies pain or any falls. Pleased that she will pay less for CHS modifications and repairs than previously thought.  Healthcare Provider Communication: Did Surveyor, mining With [employer] Provider?: No Healthcare Provider Response According to RN: n/a According to Client, Did PCP Report Communication With An Aging Gracefully RN?: No Healthcare Provider Response According To Client: n/a  Clinician View of Client Situation: Clinician View Of Client Situation: Arrived for home visit. Living room area without clutter. Living room bright from lamp lighting.Kerry Liu sitting on couch. Uses rollator when walking around home. Client's View of His/Her Situation: Client View Of His/Her Situation: Kerry Liu denies pain or any falls. Pleased that she will pay less for CHS modifications and repairs than previously thought.  Medication Assessment: reviewed    OT Update: pending CHS' home modification/repair completion  Session Summary: Kerry Liu has made great strides in obtaining necessary resources. She has been receptive and engaging with Primary school teacher. Her main concern now is finding someone to clean her home at  discounted price. She is doing well overall. Denies pain and falls.   Goals Addressed               This Visit's Progress     COMPLETED: AG RN (pt-stated)          CLIENT/RN ACTION PLAN - FALL PREVENTION  Registered Nurse:  Pablo Hurst Date: 10/27/23  Client Name:  Kerry Liu Client ID:    Target Area:  FALL PREVENTION    Why Problem May Occur: loss of balance    Target Goal: No falls over the next 160 days   STRATEGIES Coping Strategies Ideas  Change Positions Slowly: lying to sitting, sitting to standing  Reviewed 10/27/23 Reviewed 12/20/23 Reviewed 01/16/24 Reviewed 02/22/24 Changing positions can make people lightheaded. When getting up in the morning sit for a few minutes, before standing.  Stand for a few minutes before walking and hold on to sturdy furniture or countertop.    Drink water Reviewed 10/27/23 Reviewed 12/20/23 Reviewed 01/16/24 Reviewed 02/22/24 Dehydration can make people dizzy. Ask your healthcare provider how much water you can drink. Decrease caffeine, caffeine makes you urinate a lot, which can make you dehydrated.   If you fall, tell someone Reviewed 10/27/23 Reviewed 12/20/23 Reviewed 01/16/24 Reviewed 02/22/24  Tell a friend or family member even if you didn't get hurt. Tell your healthcare provider if you fall.  They can help you figure out why.   Get your vision and hearing checked Reviewed 10/27/23 Reviewed 12/20/23 Reviewed 01/16/24 Reviewed 02/22/24 Poor vision and hearing loss can make people fall. Glaucoma and diabetes can cause poor vision.   Other    Prevention Ideas  Review your Medicines  Reviewed 10/27/23 Reviewed 12/20/23 Reviewed 01/16/24 Reviewed 02/22/24 Many medicines can  make you dizzy or sleepy and increase your risk of falling. Your Aging Gracefully Nurse will look at your medications and see if you are taking any that might cause falling.   Activity and Exercise Reviewed 10/27/23 Reviewed 12/20/23 (AG exercises  demonstrated) Reviewed 01/16/24 Reviewed 02/22/24 Aging Gracefully exercises Walking (inside or outside) [employer]:  cooking, Education officer, environmental, laundry Exercise while watching TV Swimming or water aerobics.   Strengthen Bones Reviewed 10/27/23 Reviewed 12/20/23 Reviewed 01/16/24 Reviewed 02/22/24 Exercise makes your bones stronger. Vitamin D  and Calcium  make your bones stronger.  Ask your Healthcare provider if you should be taking them. Your body makes vitamin D  from the sun.  Sit in the sun for 10 minutes every day (without sunscreen).   Control your urine  Reviewed 10/27/23 Reviewed 12/20/23 Reviewed 01/16/24 Reviewed 02/22/24 Prevent constipation Cut back on caffeinated drinks. Don't wait to urinate. If diabetic, control your blood sugar. Ask your Aging Gracefully Nurse and Healthcare Provider  about urine control.   Control your blood sugar (if you are diabetic) Reviewed 12/20/23 Reviewed 01/16/24 Reviewed 02/22/24 High blood sugar can cause frequent urination, poor vision, and numbness in your feet, which can make you fall.   Low blood sugar can cause confusion and dizziness.   Other coping strategies 1. 2. 3. 4. 5.   PRACTICE It is important to practice the strategies so we can determine if they will be effective in helping to reach the goal.    Follow these specific recommendations:        If strategy does not work the first time, try it again.     We may make some changes over the next few sessions.        10/27/23  Assessment: Kerry Liu lives alone. 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 Uses multiple eye drops. Kerry Liu is unable to read. Reports she is unable see to prepare meals. Uses microwave for meals and eats sandwiches. Receives 23 dollars in food stamps and receives food voucher of 50 dollars a month from [facility].  States medications are provided by mail pharmacy [facility] in pill packs. Recently unable to afford Eliquis .  States PCP office is working on patient assistance application for Eliquis . Reports her friend [name]  assists with medication management. Reports she uses [employer] for transportation. States she only has 2 transportation passes left until the beginning of September. States she goes to [facility] for PCP because they provide transportation services to PCP appointments. States family nor church members assist. States her friend [name]  assists weekly or so. Denies any falls. Uses rollator. Ms. Egger goes to [facility]. Therefore, she is not eligible for [facility] CCM services.   Interventions: Discussed fall precautions. #1 Called to leave message for PCP Dr. Dewayne with [facility] to request PCP order home health PT/OT/RN/SW/aide and bedside commode. #2 Left message for SW at [facility] to Higher education careers adviser to discuss Ms. Sthilaire's needs. #3 Contacted DSS to inquire about Ms. Dowdle reapplying for Medicaid as she previously had Medicaid x 8 yrs and was informed 1.5 years ago that she no longer qualified. Found out [name] remains over the limit to qualify for any type of Medicaid assistance per DSS. #4 Collaborated with [facility] about Ms. Hult's medications. #5 Left message for nurse at [facility] to inquire about Eliquis  pharmacy assistance application. Ms. Livingston said PCP office was working on applying for assistance program.#6 Contacted [employer] to inquire about additional benefits. Ms. [facility] plan does  not provide transportation, care management, or medical alert system, or meals unless she has had a hospital or SNF admission. Provided writer's contact information. Left chronic disease booklet for friend [name]  to review. Encouraged Ms. Dapper to ask her friend [name]  to check her blood sugars.   Plan: Scheduled next home visit for June 17th at 10 am.   Pablo Hurst, MSN, RN, BSN [place]  [facility], Healthy Communities RN Case Manager for Aging  Gracefully Direct Dial: [phone]    12/20/23  Assessment: Ms. Beckmann denies any falls. Discussed fall precautions. Ms. Verastegui reports [facility] is providing a large clock, magnifying glass, and 90 day free medical alert system. States the cost will be 60 dollars a month after 90 days. Reports church members have been taking her to MD appointments. States her friend [name]  continues to assist when able. States her granddaughter is coming around more and is assisting when able. Reports her brother installed her window [facility] unit. She reports staying cool with the window [facility] and additional fans in the home during the hot weather. Reports refrigerator was recently down but is now fixed. However, states the little food she had in the refrigerator went bad. States she still has gone to food pantry yet. States her friend [name]  is going to take her on this Saturday. States she called [employer] again and she is number [phone] on the waiting list. States she was told it will be about 2 years before she will receive Meals on Wheels. States Geographical information systems officer did offer to enroll her in a program for transportation to MD appointments. Therefore, she may have transportation assistance thru [employer]. The drivers are volunteers. Ms. Kocourek expresses concerns about her upcoming expenses and is not sure what she can afford to cut back. She was also able to receive assistance thru her pharmacy for Eliquis . States she pays 40 dollars a month for her Eliquis . Ms. Manfre endorses home health thru [facility] is still active for PT/OT/RN services. States home health RN, granddaughter, and her friend [name]  check her blood sugars. States blood sugars have been in the 140s.  Interventions: Performed Aging Gracefully home exercises. Ms. Sipe participated with return demonstration on some of them. Provided Aging Gracefully home exercise booklet. Provided several food items since Ms. Minotti  did not have any food in the home due to the refrigerator not working recently. Encouraged Ms. Przybylski to ensure someone takes her to the food pantry. She has to go in person to fill out application and etc. Provided [employer] address, telephone number, hours, and contact person information again. Encouraged Ms. Deboy to inquire about lower cost for medical alert system. Encouraged Ms. Bartolucci to contact [facility] for questions regarding home modifications and the like. Reiterated fall precautions and reminded Ms. Macphail to have phone with her at all times when ambulating with her rollator. Complimented Ms. Sarin for doing such a great job of Insurance risk surveyor and following up and calling agencies herself.   Plan: Scheduled next home visit for July 22nd at 10 am.   Pablo Hurst, MSN, RN, BSN [place]  [facility], Healthy Communities RN Case Manager for Aging Gracefully Direct Dial: [phone]     01/16/24   Assessment: Ms. Udovich denies any recent falls. States she recently had an eye doctor appointment. States my eyes are not any better and are not any worse. States she has found a cheaper medical alert system company but states she will  engage with them after her free trial is up with the current medical alert system company. States she has tried to solicit more help from family. However, states no one is helping. States her granddaughter helps from time to time. Endorses having an upcoming meeting with a volunteer who may know someone who can clean her home for 20 dollars. States others have wanted to charge her 80 dollars a week. Which she cannot afford. States she called [facility] and was told she will need her own transportation for the day program. Reports going to the food pantry but states she will need to go on Wednesdays instead of Saturday. States [name]  is pretty busy as of late. Confirms still using transportation thru [employer]. Confirms last PCP  visit last week. Confirms [facility] continues working with her. Has a congested cough. States she has been sleeping in the living room under the air conditioner. States she has been doing home exercises 1-2 times a day.   Intervention: Discussed fall precautions. Provided bag of donated food. Discussed DM management. Encouraged Ms. Jasek to ask her granddaughter to take her to food pantry on Wednesdays. Encouraged Ms. Brownstein to contact PCP office about her congested cough.   Plan: Scheduled next home visit August 28th at 10 am.   Pablo Hurst, MSN, RN, BSN [place]  [facility], Healthy Communities RN Case Manager for Aging Gracefully Direct Dial: [phone]       02/22/24  Assessment: Ms. Carlo denies pain or recent falls. She is still in the process of looking for an affordable cleaning service. States church members are also looking for options as well. Transportation services to MD appts provided by [employer] [facility] [place]. Friend [name]  remains supportive and assists with taking Ms. Vega to [employer] and the like. Medications are delivered via pill pack. PCP appointment scheduled for 04/04/24. Reports recent podiatry appointment. Endorses putting her name on the list for [employer] meal delivery. States [employer] will let her know if they can accommodate meal delivery to her home. States current medical alert system free trial period is coming up but not sure the date. States she will call to find out. Expresses interest in [employer] which is affiliated with [employer].  Interventions: Encouraged Ms. Stella to contact current Medical Alert system to find out further details about free trial end date. Encouraged Ms. Ditton to wear current medical alert necklace at all times. Discussed fall precautions. Provided bag of food and bottled water. Called Margaret with [employer] on speaker phone with Ms. Dolinski  present. Margaret with [employer] explained program details and cost which will be 34 dollars a month for the unit Ms. Bennis is interested in. Ms. Waldron will follow up with [facility] next week after she has called current company that will charge her 65 dollars a month after trial period. Commended Ms. Mimbs for being diligent about following up with agencies, making calls, advocating for herself, and being on top of her health care needs. Discussed this is writer's last visit. Continues to have PT/OT/RN home health services thru [facility].  Plan: Will alert AG team of writer's last visit.  Goal: met- no falls.   Pablo Hurst, MSN, RN, BSN [place]  [facility], Healthy Communities RN Case Manager for Aging Gracefully Direct Dial: [phone]  Pablo Hurst, MSN, RN, BSN [place]  [facility], Healthy Communities RN Case Manager for Aging Gracefully Direct Dial: [phone]

## 2024-02-22 NOTE — Patient Instructions (Signed)
 Visit Information  Thank you for taking time to visit with me today. Please don't hesitate to contact me if I can be of assistance to you before our next scheduled home appointment.  Following are the goals we discussed today:   Goals Addressed               This Visit's Progress     COMPLETED: AG RN (pt-stated)          CLIENT/RN ACTION PLAN - FALL PREVENTION  Registered Nurse:  Pablo Hurst Date: 10/27/23  Client Name:  Kerry Liu Client ID:    Target Area:  FALL PREVENTION    Why Problem May Occur: loss of balance    Target Goal: No falls over the next 160 days   STRATEGIES Coping Strategies Ideas  Change Positions Slowly: lying to sitting, sitting to standing  Reviewed 10/27/23 Reviewed 12/20/23 Reviewed 01/16/24 Reviewed 02/22/24 Changing positions can make people lightheaded. When getting up in the morning sit for a few minutes, before standing.  Stand for a few minutes before walking and hold on to sturdy furniture or countertop.    Drink water Reviewed 10/27/23 Reviewed 12/20/23 Reviewed 01/16/24 Reviewed 02/22/24 Dehydration can make people dizzy. Ask your healthcare provider how much water you can drink. Decrease caffeine, caffeine makes you urinate a lot, which can make you dehydrated.   If you fall, tell someone Reviewed 10/27/23 Reviewed 12/20/23 Reviewed 01/16/24 Reviewed 02/22/24  Tell a friend or family member even if you didn't get hurt. Tell your healthcare provider if you fall.  They can help you figure out why.   Get your vision and hearing checked Reviewed 10/27/23 Reviewed 12/20/23 Reviewed 01/16/24 Reviewed 02/22/24 Poor vision and hearing loss can make people fall. Glaucoma and diabetes can cause poor vision.   Other    Prevention Ideas  Review your Medicines  Reviewed 10/27/23 Reviewed 12/20/23 Reviewed 01/16/24 Reviewed 02/22/24 Many medicines can make you dizzy or sleepy and increase your risk of falling. Your Aging Gracefully Nurse will look at  your medications and see if you are taking any that might cause falling.   Activity and Exercise Reviewed 10/27/23 Reviewed 12/20/23 (AG exercises demonstrated) Reviewed 01/16/24 Reviewed 02/22/24 Aging Gracefully exercises Walking (inside or outside) [employer]:  cooking, Education officer, environmental, laundry Exercise while watching TV Swimming or water aerobics.   Strengthen Bones Reviewed 10/27/23 Reviewed 12/20/23 Reviewed 01/16/24 Reviewed 02/22/24 Exercise makes your bones stronger. Vitamin D  and Calcium  make your bones stronger.  Ask your Healthcare provider if you should be taking them. Your body makes vitamin D  from the sun.  Sit in the sun for 10 minutes every day (without sunscreen).   Control your urine  Reviewed 10/27/23 Reviewed 12/20/23 Reviewed 01/16/24 Reviewed 02/22/24 Prevent constipation Cut back on caffeinated drinks. Don't wait to urinate. If diabetic, control your blood sugar. Ask your Aging Gracefully Nurse and Healthcare Provider  about urine control.   Control your blood sugar (if you are diabetic) Reviewed 12/20/23 Reviewed 01/16/24 Reviewed 02/22/24 High blood sugar can cause frequent urination, poor vision, and numbness in your feet, which can make you fall.   Low blood sugar can cause confusion and dizziness.   Other coping strategies 1. 2. 3. 4. 5.   PRACTICE It is important to practice the strategies so we can determine if they will be effective in helping to reach the goal.    Follow these specific recommendations:        If strategy does not work the  first time, try it again.     We may make some changes over the next few sessions.        10/27/23  Assessment: Kerry Liu lives alone. 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 Uses multiple eye drops. Kerry Liu is unable to read. Reports she is unable see to prepare meals. Uses microwave for meals and eats sandwiches. Receives 23 dollars in food stamps and receives food  voucher of 50 dollars a month from [facility].  States medications are provided by mail pharmacy [facility] in pill packs. Recently unable to afford Eliquis . States PCP office is working on patient assistance application for Eliquis . Reports her friend [name]  assists with medication management. Reports she uses [employer] for transportation. States she only has 2 transportation passes left until the beginning of September. States she goes to [facility] for PCP because they provide transportation services to PCP appointments. States family nor church members assist. States her friend [name]  assists weekly or so. Denies any falls. Uses rollator. Kerry [facility]. Therefore, she is not eligible for [facility] CCM services.   Interventions: Discussed fall precautions. #1 Called to leave message for PCP Dr. Dewayne with [facility] to request PCP order home health PT/OT/RN/SW/aide and bedside commode. #2 Left message for SW at [facility] to Higher education careers adviser to discuss Kerry Liu's needs. #3 Contacted DSS to inquire about Kerry Liu reapplying for Medicaid as she previously had Medicaid x 8 yrs and was informed 1.5 years ago that she no longer qualified. Found out Kerry Liu remains over the limit to qualify for any type of Medicaid assistance per DSS. #4 Collaborated with [facility] about Kerry Liu's medications. #5 Left message for nurse at [facility] to inquire about Eliquis  pharmacy assistance application. Kerry Liu said PCP office was working on applying for assistance program.#6 Contacted [employer] to inquire about additional benefits. Ms. [facility] plan does not provide transportation, care management, or medical alert system, or meals unless she has had a hospital or SNF admission. Provided writer's contact information. Left chronic disease booklet for friend [name]  to review. Encouraged Kerry Liu to ask her friend [name]  to check her blood sugars.   Plan: Scheduled next home visit  for June 17th at 10 am.   Pablo Hurst, MSN, RN, BSN [place]  [facility], Healthy Communities RN Case Manager for Aging Gracefully Direct Dial: [phone]    12/20/23  Assessment: Kerry Liu denies any falls. Discussed fall precautions. Kerry Liu reports [facility] is providing a large clock, magnifying glass, and 90 day free medical alert system. States the cost will be 60 dollars a month after 90 days. Reports church members have been taking her to MD appointments. States her friend [name]  continues to assist when able. States her granddaughter is coming around more and is assisting when able. Reports her brother installed her window [facility] unit. She reports staying cool with the window [facility] and additional fans in the home during the hot weather. Reports refrigerator was recently down but is now fixed. However, states the little food she had in the refrigerator went bad. States she still has gone to food pantry yet. States her friend [name]  is going to take her on this Saturday. States she called [employer] again and she is number [phone] on the waiting list. States she was told it will be about 2 years before she will receive Meals on Wheels. States Geographical information systems officer did offer  to enroll her in a program for transportation to MD appointments. Therefore, she may have transportation assistance thru [employer]. The drivers are volunteers. Ms. Learn expresses concerns about her upcoming expenses and is not sure what she can afford to cut back. She was also able to receive assistance thru her pharmacy for Eliquis . States she pays 40 dollars a month for her Eliquis . Ms. Wentland endorses home health thru [facility] is still active for PT/OT/RN services. States home health RN, granddaughter, and her friend [name]  check her blood sugars. States blood sugars have been in the 140s.  Interventions: Performed Aging Gracefully home exercises. Kerry Liu  participated with return demonstration on some of them. Provided Aging Gracefully home exercise booklet. Provided several food items since Kerry Liu did not have any food in the home due to the refrigerator not working recently. Encouraged Kerry Liu to ensure someone takes her to the food pantry. She has to go in person to fill out application and etc. Provided [employer] address, telephone number, hours, and contact person information again. Encouraged Kerry Liu to inquire about lower cost for medical alert system. Encouraged Kerry Liu to contact [facility] for questions regarding home modifications and the like. Reiterated fall precautions and reminded Kerry Liu to have phone with her at all times when ambulating with her rollator. Complimented Ms. Stegmann for doing such a great job of Insurance risk surveyor and following up and calling agencies herself.   Plan: Scheduled next home visit for July 22nd at 10 am.   Pablo Hurst, MSN, RN, BSN [place]  [facility], Healthy Communities RN Case Manager for Aging Gracefully Direct Dial: [phone]     01/16/24   Assessment: Ms. Gu denies any recent falls. States she recently had an eye doctor appointment. States my eyes are not any better and are not any worse. States she has found a cheaper medical alert system company but states she will engage with them after her free trial is up with the current medical alert system company. States she has tried to solicit more help from family. However, states no one is helping. States her granddaughter helps from time to time. Endorses having an upcoming meeting with a volunteer who may know someone who can clean her home for 20 dollars. States others have wanted to charge her 80 dollars a week. Which she cannot afford. States she called [facility] and was told she will need her own transportation for the day program. Reports going to the food pantry but states she will need to go on  Wednesdays instead of Saturday. States [name]  is pretty busy as of late. Confirms still using transportation thru [employer]. Confirms last PCP visit last week. Confirms [facility] continues working with her. Has a congested cough. States she has been sleeping in the living room under the air conditioner. States she has been doing home exercises 1-2 times a day.   Intervention: Discussed fall precautions. Provided bag of donated food. Discussed DM management. Encouraged Ms. Dimitroff to ask her granddaughter to take her to food pantry on Wednesdays. Encouraged Ms. Goffe to contact PCP office about her congested cough.   Plan: Scheduled next home visit August 28th at 10 am.   Pablo Hurst, MSN, RN, BSN [place]  [facility], Healthy Communities RN Case Manager for Aging Gracefully Direct Dial: [phone]       02/22/24  Assessment: Ms. Arreaga denies pain or recent falls. She is still in the process  of looking for an affordable cleaning service. States church members are also looking for options as well. Transportation services to MD appts provided by [employer] [facility] [place]. Friend [name]  remains supportive and assists with taking Ms. Feeback to [employer] and the like. Medications are delivered via pill pack. PCP appointment scheduled for 04/04/24. Reports recent podiatry appointment. Endorses putting her name on the list for [employer] meal delivery. States [employer] food pantry will let her know if they can accommodate meal delivery to her home. States current medical alert system free trial period is coming up but not sure the date. States she will call to find out. Expresses interest in [employer] which is affiliated with [employer].  Interventions: Encouraged Ms. Hendricksen to contact current Medical Alert system to find out further details about free trial end date. Encouraged Ms. Demeyer to wear current medical alert  necklace at all times. Discussed fall precautions. Provided bag of food and bottled water. Called Margaret with [employer] on speaker phone with Ms. Cutter present. Margaret with [employer] explained program details and cost which will be 34 dollars a month for the unit Ms. Stabler is interested in. Ms. Connett will follow up with [facility] next week after she has called current company that will charge her 65 dollars a month after trial period. Commended Ms. Howerter for being diligent about following up with agencies, making calls, advocating for herself, and being on top of her health care needs. Discussed this is writer's last visit. Continues to have PT/OT/RN home health services thru [facility].  Plan: Will alert AG team of writer's last visit.  Goal: met- no falls.   Pablo Hurst, MSN, RN, BSN [place]  [facility], Healthy Communities RN Case Manager for Aging Gracefully Direct Dial: [phone]                                                                                                                     If you are experiencing a Mental Health or Behavioral Health Crisis or need someone to talk to, please call the Suicide and Crisis Lifeline: 988 call the [place]  National Suicide Prevention Lifeline: 970-416-8509 or TTY: 913-175-8162 TTY 858 700 1966) to talk to a trained counselor call 1-800-273-TALK (toll free, 24 hour hotline) go to [facility] [address] [place] [phone]) call 911   The patient verbalized understanding of instructions, educational materials, and care plan provided today and agreed to receive a mailed copy of patient instructions, educational materials, and care plan.   Pablo Hurst, MSN, RN, BSN [place]  [facility], Healthy Communities RN Case Manager for Aging Gracefully Direct Dial:  [phone]

## 2024-03-12 ENCOUNTER — Other Ambulatory Visit: Payer: Self-pay | Admitting: Specialist

## 2024-03-17 NOTE — Patient Outreach (Signed)
 Aging Gracefully Program  OT Follow-Up Visit  03/17/2024  Kerry Liu Jun 09, 1951 [id number]  Visit:  4- Fourth Visit  Start Time:  1630 End Time:  1730 Total Minutes:  60  Readiness to Change Score :     Home Environment Assessment:    Durable Medical Equipment: Durable Medical Equipment: Tub Transfer Bench Durable Medical Equipment Distribution Date: 03/12/24 Adaptive Equipment: Other (magnifying lamp) Adaptive Equipment Distribution Date: 03/12/24     Goals:   Goals Addressed             This Visit's Progress    Patient Stated       Improve bathroom safety and independence with transfers.  Name _________________________________             Date__________________  OT ACTION PLAN: Functional Mobility  Target Problem Area:   Difficulty transferring in and out of tub  Why Problem May Occur:     Pain in back and legs  Decreased mobility Decreased balance Tub wall height Nothing to hold onto when stepping into tub    Target Goal(s):   Improve safety and independence with tub transfers.    STRATEGIES   Saving Your Energy DO:  Take breaks  Raise the height of surfaces   Remove tripping hazards  (Other):   (Other):   (Other):     Modifying your home environment and making it safe    DO:  Install grab bars in the bathroom  Remove or strongly secure throw rugs  Use tub transfer bench for transfers in and out of tub in masterbath    (Other):   (Other):     Simplifying the way you set up tasks or daily routines DO:  Move slowly   (Other):   (Other):   (Other):   (Other):      PRACTICE  Based on what we have talked about, you are willing to try:  ________________________________________________________________  ________________________________________________________________  If a strategy does not work the first time, try it again (and again).  We may make some changes over the next few sessions, based on how they  work.    ________________________________________________   __________ Occupational Therapist          Date            Post Clinical Reasoning: Client Action (Goal) Two Interventions: Difficulty with safety and ADLs due to low vision - provided patient with floor based magnifying lamp - patient able to use to read mail and view other items while seated in living room. Did Client Try?: Yes Targeted Problem Area Status: A Little Better Client Action (Goal) Three Interventions: improved safety with tub transfers - educated paitent on use of tub transfer bench in tub in master bath - ttb is vey close to  commode and patient will have to swing legs over commode and then into the tub.  patient is able to complete, however, it is not the safest option.....  hall shower has ttb, however, tub now has holes in it from that shower seat.  OT discussed possibility of walk in shower with patient and with [facility].  We will move forward with a tub to shower conversion, as this is the safest option for Kerry Liu. Did Client Try?: Yes Targeted Problem Area Status: A Little Better Clinician View Of Client Situation:: Patient is doing as well as she can.  She is using lighting in her home more often which improves her safety when ambulating in her home.  [facility] are visiting with client tomorrow which will provide additional adaptive equipment and help to further rearrange her kitchen (despite being a goal, these changes were not made after disussing with OT). Client View Of His/Her Situation:: doing as well as she can, she is sending her old lifeline back as she has found a better option that is less expensive.  She is hoping to find a cleaning person that will help her clean her home weekly.  She is excited about the agency for the blind visiting tomorrow. Next Visit Plan:: Follow up on bathroom safety and meal prep.   review tips for aging in place booklet.  OT to call [facility] to determine when work is  scheduled to begin.  [name], MHA, OT/L [phone]

## 2024-04-02 ENCOUNTER — Other Ambulatory Visit: Payer: Self-pay | Admitting: *Deleted

## 2024-04-02 NOTE — Patient Outreach (Signed)
 Telephone call made to Kerry Liu to discuss potential [facility] referral. Kerry Liu is agreeable to referral.   Called [facility] on the 3 way along with Kerry Liu on the line. Spoke with Kerry Liu who explained [facility] services and fees. Kerry Liu agreeable with proceeding with application process for [facility]. Kerry Liu to come to home tomorrow to begin application process. Kerry Liu requests writer to be present as well.  Pablo Hurst, MSN, RN, BSN [place]  [facility], Healthy Communities RN Case Manager for Aging Gracefully Direct Dial: [phone]

## 2024-04-03 ENCOUNTER — Other Ambulatory Visit: Payer: Self-pay | Admitting: *Deleted

## 2024-04-03 NOTE — Patient Outreach (Signed)
 Aging Gracefully Program  04/03/2024  NYNA CHILTON 1950/09/10 [id number]   Arrived to Ms. Casebeer's home. Met with Ms. Bow and Necole with [facility] while Ms. Gagner began the application process for [facility].    Pablo Hurst, MSN, RN, BSN [place]  [facility], Healthy Communities RN Case Manager for Aging Gracefully Direct Dial: [phone]

## 2024-04-23 ENCOUNTER — Other Ambulatory Visit: Payer: Self-pay | Admitting: Specialist

## 2024-04-23 NOTE — Patient Outreach (Signed)
 Aging Gracefully Program  OT FINAL Visit  04/23/2024  Kerry Liu December 16, 1950 [id number]  Visit:  5- Fifth Visit  Start Time:  1630 End Time:  1715 Total Minutes:  45  Readiness to Change:  Readiness to Change Score: 9.67  Home modifications completed:  walk in shower and grab bars, improved lighting, ramp, new front door, repaired ceiling in laundry room, comfort level commode Patient Education: Education Provided: Yes Education Details: provided tips for aging booklet and reviewed how to [employer]) Educated: Patient  Goals:  Goals Addressed             This Visit's Progress    COMPLETED: Patient Stated   On track    Improve lighting in home for greater safety and independence with daily tasks.      COMPLETED: Patient Stated   On track    Improve bathroom safety and independence with transfers.  Name _________________________________             Date__________________  OT ACTION PLAN: Functional Mobility  Target Problem Area:   Difficulty transferring in and out of tub  Why Problem May Occur:     Pain in back and legs  Decreased mobility Decreased balance Tub wall height Nothing to hold onto when stepping into tub    Target Goal(s):   Improve safety and independence with tub transfers.    STRATEGIES   Saving Your Energy DO:  Take breaks  Raise the height of surfaces   Remove tripping hazards  (Other):   (Other):   (Other):     Modifying your home environment and making it safe    DO:  Install grab bars in the bathroom  Remove or strongly secure throw rugs  Use tub transfer bench for transfers in and out of tub in [place] - reviewed how to use shower curtain in ttb.   (Other):   (Other):     Simplifying the way you set up tasks or daily routines DO:  Move slowly   (Other):   (Other):   (Other):   (Other):      PRACTICE  Based on what we have talked about, you are willing to  try:  ________________________________________________________________  ________________________________________________________________  If a strategy does not work the first time, try it again (and again).  We may make some changes over the next few sessions, based on how they work.    ________________________________________________   __________ Occupational Therapist          Date         COMPLETED: Patient Stated       Improve independence with lower extremity dressing and bathing.  Name _________________________________       Date__________________  OT ACTION PLAN: Dressing and Bathing  Target Problem Area:   Decreased ability to reach lower extremities for bathing and dressing   Why Problem May Occur:     Can't bend over  Can't cross legs  Back and leg pain  Can't see items when they are on floor or too far away      Target Goal(s):   Improve safety and independence with lower extremity dressing and bathing.    STRATEGIES   Saving Your Energy DO:  X  Sit down to do tasks- a lightweight chair can be kept in the bathroom  X  Use appropriate adaptive equipment (circle appropriate equipment): long-handled shoe horn, sock aide, dressing stick, reacher, long handled sponge.   Keep all items you'll need within easy  reach  (Other):   (Other):   (Other):     Modifying your home environment and making it safe    DO:  X keep all clothing out and lay flat prior to attempting to don.                Simplifying the way you set up tasks or daily routines DO:  Wear sturdy shoes that grip the floor   Gather everything you will need before beginning  Wear clothing that is easily manipulated (elastic waistband, etc.)  (Other)   (Other):   (Other):     PRACTICE  Based on what we have talked about, you are willing to try:  __use Adaptive Equipment for dressing and bathing.  ___________________  ________________________________________________________________   If an idea does not work the first time, try it again (and again).  We may make some changes over the next few sessions, based on how they work.     Kerry Liu, Kerry Liu, Kerry Liu [phone] ___________________________________________    ___________ Occupational Therapist          Date       COMPLETED: Patient Stated   On track    Improve kitchen safety and independence with meal prep.  Difficulty with safety and ADLs in home due to poor vision. - issued a hand held magnifying glass with light function. Patient will need to get batteries for light function to work. Discussed tips and tricks for low vision when cooking and shopping, including using contrasting colors when measuring, chopping, filling cups and bowls. Patient also uses raised paint on microwave buttons and stacks food with containers of vegetables on one side of fridge, meats on other. Suggested use of prechopped food items purchased from the grocery store. Discussed rotating foods so that older are on top and used prior to newer foods. Provided patient with handout that includes these and many other strategies.      COMPLETED: Patient Stated       Improve independence entering and exiting home.   Name _________________________________             Date__________________  OT ACTION PLAN: Functional Mobility  Target Problem Area:   Difficulty entering and exiting home  Why Problem May Occur:     Step up into home difficult to navigate with walker  Poor mobility Poor vision makes walking outside difficult Walkway to driveway is uneven.        Target Goal(s):   Safely enter and exit home.     STRATEGIES   Saving Your Energy DO:  Take breaks  Raise the height of surfaces   Remove tripping hazards  X use ramp to enter and exit home versus uneven surfaces.   (Other):   (Other):     Modifying your home  environment and making it safe    DO:  Install grab bars in the bathroom  Remove or strongly secure throw rugs  (Other):   (Other):   (Other):     Simplifying the way you set up tasks or daily routines DO:  Move slowly   (Other):   (Other):   (Other):   (Other):      PRACTICE  Based on what we have talked about, you are willing to try:  ___use ramp to enter and exit home. _____________________________________________________________  ________________________________________________________________  If a strategy does not work the first time, try it again (and again).  We may make some changes over the next few sessions, based on  how they work.   Kerry Liu, Kerry Liu, Kerry Liu ________________________________________________   __________ Occupational Therapist          Date    o        Post Clinical Reasoning: Client Action (Goal) Three Interventions: folllow up on bathroom mobility goal: tub replaced with a walk in shower with 2 grab bars.  Patient has tub transfer bench and OT demonstrated how to utilize the ttb in the walk in shower.  OT demonstrated how to cut 2 slits in shower curtain and then tuck middle piece of shower curtain into crack in seat of ttb.  if prefered, patient could get a second shower curtain and place around outside of chair for extra protection again water.  OT wrote instructions down so that patient's friend could review this.  Patient did not want OT to cut her shower curtain this date. Did Client Try?: Yes Targeted Problem Area Status: A Little Better Client Action (Goal) Four Interventions: Paitent will improve safety entering and exiting home.  Patient now has a ramp at front entrance so that she can safely walk to driveway when leaving home. Did Client Try?: Yes Targeted Problem Area Status: A Lot Worse Clinician View Of Client Situation:: Patient is doing well.  She has more lights on in her home and has made progress with modifiying her  kitchen, with the help of her grand daughter.  She continues to use the lighting and adaptive equipment and dme that has been provided and she has been educated to use. Client View Of His/Her Situation:: Patient is pleased with the modfications and states that they are making her feel safer in her home.  She is unsure about the use of the ttb in her shower, and may look fo a shower seat. Next Visit Plan:: DC fom OT and Aging Gracefully program this date.  Patient has met 5/5 OT goals and is satisfied with her progress with the aging gracefully program.   Kerry Liu, Kerry Liu, Kerry Liu [phone]

## 2024-05-28 ENCOUNTER — Ambulatory Visit (INDEPENDENT_AMBULATORY_CARE_PROVIDER_SITE_OTHER): Admitting: Podiatry

## 2024-05-28 DIAGNOSIS — Z91199 Patient's noncompliance with other medical treatment and regimen due to unspecified reason: Secondary | ICD-10-CM

## 2024-05-31 NOTE — Progress Notes (Signed)
 1. No-show for appointment

## 2024-07-10 ENCOUNTER — Telehealth: Payer: Self-pay

## 2024-07-10 ENCOUNTER — Ambulatory Visit: Admitting: Podiatry

## 2024-07-10 ENCOUNTER — Encounter: Payer: Self-pay | Admitting: Podiatry

## 2024-07-10 ENCOUNTER — Other Ambulatory Visit: Payer: Self-pay

## 2024-07-10 DIAGNOSIS — M79674 Pain in right toe(s): Secondary | ICD-10-CM

## 2024-07-10 DIAGNOSIS — M79675 Pain in left toe(s): Secondary | ICD-10-CM

## 2024-07-10 DIAGNOSIS — E119 Type 2 diabetes mellitus without complications: Secondary | ICD-10-CM

## 2024-07-10 DIAGNOSIS — B351 Tinea unguium: Secondary | ICD-10-CM

## 2024-07-10 DIAGNOSIS — B353 Tinea pedis: Secondary | ICD-10-CM | POA: Diagnosis not present

## 2024-07-10 MED ORDER — KETOCONAZOLE 2 % EX CREA: TOPICAL_CREAM | CUTANEOUS | 1 refills | Status: AC

## 2024-07-10 NOTE — Patient Outreach (Signed)
 Aging Gracefully Program  07/10/2024  Kerry Liu 1950/12/03 [id number]   [facility] Evaluation Interviewer attempted to call patient on today regarding Aging Gracefully referral. No answer from patient after multiple rings. CMA left confidential voicemail for patient to return call.  Will attempt to call back within 1 week.    Shereen [facility]  Population Health Care Management Assistant  Direct Dial: [phone]  Fax: [phone] Website: [email or web address]

## 2024-07-17 NOTE — Progress Notes (Unsigned)
"  °  Subjective:  Patient ID: Kerry Liu, female    DOB: 1950-12-22,  MRN: [id number]  Kerry Liu presents to clinic today for preventative diabetic foot care for painful mycotic toenails of both feet that are difficult to trim. Pain interferes with daily activities and wearing enclosed shoe gear comfortably.  Chief Complaint  Patient presents with   [facility]    NIDDM Diabetic patient with an A1c of 5.2 presents to office today for a Diabetic Foot Care and nail trim. PCP Himanshu Paliwal   New problem(s): None.   PCP is Paliwal, Himanshu, MD.  Allergies[1]  Review of Systems: Negative except as noted in the HPI.  Objective: No changes noted in today's physical examination. There were no vitals filed for this visit. Kerry Liu is a pleasant 74 y.o. female in NAD. AAO x 3.  Vascular Examination: Capillary refill time immediate b/l. Palpable pedal pulses. Pedal hair present b/l. Pedal edema absent. No pain with calf compression b/l. Skin temperature gradient WNL b/l. No cyanosis or clubbing b/l. No ischemia or gangrene noted b/l.   Neurological Examination: Sensation grossly intact b/l with 10 gram monofilament. Vibratory sensation intact b/l.   Dermatological Examination: Diffuse scaling noted peripherally and plantarly b/l feet.  No interdigital macerations.  No blisters, no weeping. No signs of secondary bacterial infection noted. No open wounds. No interdigital macerations.   Toenails 1-5 b/l thick, discolored, elongated with subungual debris and pain on dorsal palpation.   No corns, calluses, nor porokeratotic lesions.  Musculoskeletal Examination: Muscle strength 5/5 to all lower extremity muscle groups bilaterally. No pain, crepitus or joint limitation noted with ROM bilateral LE.  Radiographs: None  Assessment/Plan: 1. Pain due to onychomycosis of toenails of both feet   2. Tinea pedis of both feet   3. Controlled type 2 diabetes mellitus without complication, without  long-term current use of insulin  (HCC)     Meds ordered this encounter  Medications   ketoconazole  (NIZORAL ) 2 % cream    Sig: Apply to both feet and between toes once daily for 6 weeks.    Dispense:  60 g    Refill:  1   Consent given for treatment. Patient examined. All patient's and/or POA's questions/concerns addressed on today's visit. Toenails 1-5 b/l debrided in length and girth without incident. Continue foot and shoe inspections daily. Monitor blood glucose per PCP/Endocrinologist's recommendations. Continue soft, supportive shoe gear daily. Report any pedal injuries to medical professional. Call office if there are any questions/concerns. -Patient/POA to call should there be question/concern in the interim.   Return in about 3 months (around 10/08/2024).  Kerry Liu, DPM      [place] LOCATION: [address] [place] [address]                   Office [phone]   [facility] LOCATION: [address] [place], [place] [address] Office [phone]     [1]  Allergies Allergen Reactions   Codeine  Itching   Hydrocodone Itching   Oxycodone Rash   Tramadol Rash   "

## 2024-10-15 ENCOUNTER — Ambulatory Visit: Admitting: Podiatry
